# Patient Record
Sex: Female | Born: 1945
Health system: Southern US, Community
[De-identification: ages and names within clinical notes are randomized; demographics above are authoritative.]

## PROBLEM LIST (undated history)

## (undated) DIAGNOSIS — D649 Anemia, unspecified: Secondary | ICD-10-CM

## (undated) DIAGNOSIS — E119 Type 2 diabetes mellitus without complications: Secondary | ICD-10-CM

## (undated) DIAGNOSIS — I10 Essential (primary) hypertension: Secondary | ICD-10-CM

## (undated) DIAGNOSIS — M1990 Unspecified osteoarthritis, unspecified site: Secondary | ICD-10-CM

## (undated) DIAGNOSIS — E785 Hyperlipidemia, unspecified: Secondary | ICD-10-CM

## (undated) DIAGNOSIS — T7840XA Allergy, unspecified, initial encounter: Secondary | ICD-10-CM

## (undated) HISTORY — DX: Essential (primary) hypertension: I10

## (undated) HISTORY — DX: Anemia, unspecified: D64.9

## (undated) HISTORY — DX: Hyperlipidemia, unspecified: E78.5

## (undated) HISTORY — PX: POLYPECTOMY: SHX149

## (undated) HISTORY — DX: Unspecified osteoarthritis, unspecified site: M19.90

## (undated) HISTORY — DX: Type 2 diabetes mellitus without complications: E11.9

## (undated) HISTORY — DX: Allergy, unspecified, initial encounter: T78.40XA

---

## 1992-11-11 HISTORY — PX: ABDOMINAL HYSTERECTOMY: SHX81

## 1998-07-07 ENCOUNTER — Other Ambulatory Visit: Admission: RE | Admit: 1998-07-07 | Discharge: 1998-07-07 | Payer: Self-pay | Admitting: Obstetrics and Gynecology

## 1998-11-14 ENCOUNTER — Ambulatory Visit (HOSPITAL_COMMUNITY): Admission: RE | Admit: 1998-11-14 | Discharge: 1998-11-14 | Payer: Self-pay | Admitting: Internal Medicine

## 1998-11-14 ENCOUNTER — Encounter: Payer: Self-pay | Admitting: Internal Medicine

## 1999-08-13 ENCOUNTER — Other Ambulatory Visit: Admission: RE | Admit: 1999-08-13 | Discharge: 1999-08-13 | Payer: Self-pay | Admitting: Obstetrics and Gynecology

## 1999-12-28 ENCOUNTER — Encounter: Payer: Self-pay | Admitting: Internal Medicine

## 1999-12-28 ENCOUNTER — Ambulatory Visit (HOSPITAL_COMMUNITY): Admission: RE | Admit: 1999-12-28 | Discharge: 1999-12-28 | Payer: Self-pay | Admitting: Internal Medicine

## 2000-07-28 ENCOUNTER — Other Ambulatory Visit: Admission: RE | Admit: 2000-07-28 | Discharge: 2000-07-28 | Payer: Self-pay | Admitting: Obstetrics and Gynecology

## 2000-12-29 ENCOUNTER — Encounter: Payer: Self-pay | Admitting: Internal Medicine

## 2000-12-29 ENCOUNTER — Ambulatory Visit (HOSPITAL_COMMUNITY): Admission: RE | Admit: 2000-12-29 | Discharge: 2000-12-29 | Payer: Self-pay | Admitting: Internal Medicine

## 2001-08-20 ENCOUNTER — Other Ambulatory Visit: Admission: RE | Admit: 2001-08-20 | Discharge: 2001-08-20 | Payer: Self-pay | Admitting: Obstetrics and Gynecology

## 2001-11-11 HISTORY — PX: FLEXIBLE SIGMOIDOSCOPY: SHX1649

## 2002-01-01 ENCOUNTER — Encounter: Payer: Self-pay | Admitting: Internal Medicine

## 2002-01-01 ENCOUNTER — Ambulatory Visit (HOSPITAL_COMMUNITY): Admission: RE | Admit: 2002-01-01 | Discharge: 2002-01-01 | Payer: Self-pay | Admitting: Internal Medicine

## 2002-08-30 ENCOUNTER — Other Ambulatory Visit: Admission: RE | Admit: 2002-08-30 | Discharge: 2002-08-30 | Payer: Self-pay | Admitting: Obstetrics and Gynecology

## 2002-12-13 ENCOUNTER — Emergency Department (HOSPITAL_COMMUNITY): Admission: EM | Admit: 2002-12-13 | Discharge: 2002-12-13 | Payer: Self-pay | Admitting: Emergency Medicine

## 2002-12-13 ENCOUNTER — Encounter: Payer: Self-pay | Admitting: Emergency Medicine

## 2003-04-22 ENCOUNTER — Ambulatory Visit (HOSPITAL_COMMUNITY): Admission: RE | Admit: 2003-04-22 | Discharge: 2003-04-22 | Payer: Self-pay | Admitting: Internal Medicine

## 2003-04-22 ENCOUNTER — Encounter: Payer: Self-pay | Admitting: Internal Medicine

## 2003-07-23 ENCOUNTER — Encounter: Payer: Self-pay | Admitting: Emergency Medicine

## 2003-07-23 ENCOUNTER — Emergency Department (HOSPITAL_COMMUNITY): Admission: EM | Admit: 2003-07-23 | Discharge: 2003-07-23 | Payer: Self-pay | Admitting: Emergency Medicine

## 2003-12-23 ENCOUNTER — Other Ambulatory Visit: Admission: RE | Admit: 2003-12-23 | Discharge: 2003-12-23 | Payer: Self-pay | Admitting: Obstetrics and Gynecology

## 2004-01-27 ENCOUNTER — Encounter: Payer: Self-pay | Admitting: Internal Medicine

## 2004-04-25 ENCOUNTER — Ambulatory Visit (HOSPITAL_COMMUNITY): Admission: RE | Admit: 2004-04-25 | Discharge: 2004-04-25 | Payer: Self-pay | Admitting: Internal Medicine

## 2005-01-22 ENCOUNTER — Ambulatory Visit: Payer: Self-pay | Admitting: Internal Medicine

## 2005-01-28 ENCOUNTER — Ambulatory Visit: Payer: Self-pay | Admitting: Internal Medicine

## 2005-03-25 ENCOUNTER — Ambulatory Visit: Payer: Self-pay | Admitting: Internal Medicine

## 2005-04-26 ENCOUNTER — Ambulatory Visit (HOSPITAL_COMMUNITY): Admission: RE | Admit: 2005-04-26 | Discharge: 2005-04-26 | Payer: Self-pay | Admitting: Internal Medicine

## 2005-06-24 ENCOUNTER — Ambulatory Visit: Payer: Self-pay | Admitting: Internal Medicine

## 2005-09-10 ENCOUNTER — Ambulatory Visit: Payer: Self-pay | Admitting: Internal Medicine

## 2005-12-06 ENCOUNTER — Other Ambulatory Visit: Admission: RE | Admit: 2005-12-06 | Discharge: 2005-12-06 | Payer: Self-pay | Admitting: Obstetrics and Gynecology

## 2005-12-06 ENCOUNTER — Ambulatory Visit: Payer: Self-pay | Admitting: Internal Medicine

## 2006-02-21 ENCOUNTER — Ambulatory Visit: Payer: Self-pay | Admitting: Internal Medicine

## 2006-03-03 ENCOUNTER — Ambulatory Visit: Payer: Self-pay | Admitting: Internal Medicine

## 2006-04-14 ENCOUNTER — Ambulatory Visit: Payer: Self-pay | Admitting: Internal Medicine

## 2006-06-13 ENCOUNTER — Ambulatory Visit: Payer: Self-pay | Admitting: Internal Medicine

## 2006-07-10 ENCOUNTER — Ambulatory Visit (HOSPITAL_COMMUNITY): Admission: RE | Admit: 2006-07-10 | Discharge: 2006-07-10 | Payer: Self-pay | Admitting: Internal Medicine

## 2006-11-11 HISTORY — PX: COLONOSCOPY: SHX174

## 2006-12-12 ENCOUNTER — Ambulatory Visit: Payer: Self-pay | Admitting: Internal Medicine

## 2007-03-06 ENCOUNTER — Ambulatory Visit: Payer: Self-pay | Admitting: Internal Medicine

## 2007-03-06 LAB — CONVERTED CEMR LAB
ALT: 19 units/L (ref 0–40)
AST: 18 units/L (ref 0–37)
Albumin: 3.9 g/dL (ref 3.5–5.2)
Alkaline Phosphatase: 90 units/L (ref 39–117)
BUN: 16 mg/dL (ref 6–23)
Basophils Absolute: 0.1 10*3/uL (ref 0.0–0.1)
Basophils Relative: 0.7 % (ref 0.0–1.0)
Bilirubin, Direct: 0.1 mg/dL (ref 0.0–0.3)
CO2: 32 meq/L (ref 19–32)
Calcium: 8.9 mg/dL (ref 8.4–10.5)
Chloride: 106 meq/L (ref 96–112)
Cholesterol: 194 mg/dL (ref 0–200)
Creatinine, Ser: 0.6 mg/dL (ref 0.4–1.2)
Eosinophils Absolute: 0.2 10*3/uL (ref 0.0–0.6)
Eosinophils Relative: 2.1 % (ref 0.0–5.0)
GFR calc Af Amer: 130 mL/min
GFR calc non Af Amer: 108 mL/min
Glucose, Bld: 102 mg/dL — ABNORMAL HIGH (ref 70–99)
HCT: 35.2 % — ABNORMAL LOW (ref 36.0–46.0)
HDL: 68.3 mg/dL (ref >39.0)
Hemoglobin: 11.9 g/dL — ABNORMAL LOW (ref 12.0–15.0)
LDL Cholesterol: 117 mg/dL — ABNORMAL HIGH (ref 0–99)
Lymphocytes Relative: 37.9 % (ref 12.0–46.0)
MCHC: 33.8 g/dL (ref 30.0–36.0)
MCV: 80.9 fL (ref 78.0–100.0)
Monocytes Absolute: 0.7 10*3/uL (ref 0.2–0.7)
Monocytes Relative: 8.8 % (ref 3.0–11.0)
Neutro Abs: 3.7 10*3/uL (ref 1.4–7.7)
Neutrophils Relative %: 50.5 % (ref 43.0–77.0)
Platelets: 241 10*3/uL (ref 150–400)
Potassium: 3.8 meq/L (ref 3.5–5.1)
RBC: 4.35 M/uL (ref 3.87–5.11)
RDW: 13.2 % (ref 11.5–14.6)
Sodium: 145 meq/L (ref 135–145)
TSH: 1.92 microintl units/mL (ref 0.35–5.50)
Total Bilirubin: 0.8 mg/dL (ref 0.3–1.2)
Total CHOL/HDL Ratio: 2.8
Total Protein: 7.2 g/dL (ref 6.0–8.3)
Triglycerides: 44 mg/dL (ref 0–149)
VLDL: 9 mg/dL (ref 0–40)
WBC: 7.6 10*3/uL (ref 4.5–10.5)

## 2007-03-20 ENCOUNTER — Ambulatory Visit: Payer: Self-pay | Admitting: Internal Medicine

## 2007-04-14 ENCOUNTER — Ambulatory Visit: Payer: Self-pay | Admitting: Gastroenterology

## 2007-04-28 ENCOUNTER — Encounter: Payer: Self-pay | Admitting: Internal Medicine

## 2007-04-28 ENCOUNTER — Encounter: Payer: Self-pay | Admitting: Gastroenterology

## 2007-04-28 ENCOUNTER — Ambulatory Visit: Payer: Self-pay | Admitting: Gastroenterology

## 2007-06-23 DIAGNOSIS — J309 Allergic rhinitis, unspecified: Secondary | ICD-10-CM | POA: Insufficient documentation

## 2007-06-23 DIAGNOSIS — M1990 Unspecified osteoarthritis, unspecified site: Secondary | ICD-10-CM | POA: Insufficient documentation

## 2007-06-23 DIAGNOSIS — I10 Essential (primary) hypertension: Secondary | ICD-10-CM | POA: Insufficient documentation

## 2007-12-11 ENCOUNTER — Ambulatory Visit: Payer: Self-pay | Admitting: Internal Medicine

## 2007-12-11 ENCOUNTER — Ambulatory Visit (HOSPITAL_COMMUNITY): Admission: RE | Admit: 2007-12-11 | Discharge: 2007-12-11 | Payer: Self-pay | Admitting: Internal Medicine

## 2007-12-11 DIAGNOSIS — Z860100 Personal history of colon polyps, unspecified: Secondary | ICD-10-CM | POA: Insufficient documentation

## 2007-12-11 DIAGNOSIS — M899 Disorder of bone, unspecified: Secondary | ICD-10-CM | POA: Insufficient documentation

## 2007-12-11 DIAGNOSIS — M949 Disorder of cartilage, unspecified: Secondary | ICD-10-CM

## 2007-12-11 LAB — CONVERTED CEMR LAB
BUN: 14 mg/dL (ref 6–23)
CO2: 31 meq/L (ref 19–32)
Calcium: 9.7 mg/dL (ref 8.4–10.5)
Chloride: 101 meq/L (ref 96–112)
Creatinine, Ser: 0.7 mg/dL (ref 0.4–1.2)
GFR calc Af Amer: 109 mL/min
GFR calc non Af Amer: 90 mL/min
Glucose, Bld: 94 mg/dL (ref 70–99)
Potassium: 4.5 meq/L (ref 3.5–5.1)
Sodium: 140 meq/L (ref 135–145)

## 2007-12-18 ENCOUNTER — Ambulatory Visit: Payer: Self-pay | Admitting: Internal Medicine

## 2007-12-18 ENCOUNTER — Encounter: Payer: Self-pay | Admitting: Internal Medicine

## 2008-06-03 ENCOUNTER — Ambulatory Visit: Payer: Self-pay | Admitting: Internal Medicine

## 2008-06-03 LAB — CONVERTED CEMR LAB
ALT: 19 units/L (ref 0–35)
AST: 22 units/L (ref 0–37)
Albumin: 4 g/dL (ref 3.5–5.2)
Alkaline Phosphatase: 92 units/L (ref 39–117)
BUN: 16 mg/dL (ref 6–23)
Basophils Absolute: 0 10*3/uL (ref 0.0–0.1)
Basophils Relative: 0.5 % (ref 0.0–3.0)
Bilirubin Urine: NEGATIVE
Bilirubin, Direct: 0.1 mg/dL (ref 0.0–0.3)
Blood in Urine, dipstick: NEGATIVE
CO2: 27 meq/L (ref 19–32)
Calcium: 8.7 mg/dL (ref 8.4–10.5)
Chloride: 102 meq/L (ref 96–112)
Cholesterol: 193 mg/dL (ref 0–200)
Creatinine, Ser: 0.7 mg/dL (ref 0.4–1.2)
Eosinophils Absolute: 0.3 10*3/uL (ref 0.0–0.7)
Eosinophils Relative: 5.6 % — ABNORMAL HIGH (ref 0.0–5.0)
GFR calc Af Amer: 109 mL/min
GFR calc non Af Amer: 90 mL/min
Glucose, Bld: 121 mg/dL — ABNORMAL HIGH (ref 70–99)
Glucose, Urine, Semiquant: NEGATIVE
HCT: 35.1 % — ABNORMAL LOW (ref 36.0–46.0)
HDL: 45.6 mg/dL (ref >39.0)
Hemoglobin: 11.7 g/dL — ABNORMAL LOW (ref 12.0–15.0)
Ketones, urine, test strip: NEGATIVE
LDL Cholesterol: 135 mg/dL — ABNORMAL HIGH (ref 0–99)
Lymphocytes Relative: 41.2 % (ref 12.0–46.0)
MCHC: 33.3 g/dL (ref 30.0–36.0)
MCV: 84 fL (ref 78.0–100.0)
Monocytes Absolute: 0.5 10*3/uL (ref 0.1–1.0)
Monocytes Relative: 9.1 % (ref 3.0–12.0)
Neutro Abs: 2.6 10*3/uL (ref 1.4–7.7)
Neutrophils Relative %: 43.6 % (ref 43.0–77.0)
Nitrite: NEGATIVE
Platelets: 227 10*3/uL (ref 150–400)
Potassium: 4.1 meq/L (ref 3.5–5.1)
Protein, U semiquant: NEGATIVE
RBC: 4.18 M/uL (ref 3.87–5.11)
RDW: 11.7 % (ref 11.5–14.6)
Sodium: 138 meq/L (ref 135–145)
Specific Gravity, Urine: 1.02
TSH: 1.43 microintl units/mL (ref 0.35–5.50)
Total Bilirubin: 0.7 mg/dL (ref 0.3–1.2)
Total CHOL/HDL Ratio: 4.2
Total Protein: 7.5 g/dL (ref 6.0–8.3)
Triglycerides: 60 mg/dL (ref 0–149)
Urobilinogen, UA: 0.2
VLDL: 12 mg/dL (ref 0–40)
WBC Urine, dipstick: NEGATIVE
WBC: 5.8 10*3/uL (ref 4.5–10.5)
pH: 5.5

## 2008-06-10 ENCOUNTER — Ambulatory Visit: Payer: Self-pay | Admitting: Internal Medicine

## 2008-07-09 LAB — CONVERTED CEMR LAB: Pap Smear: NORMAL

## 2008-09-22 ENCOUNTER — Telehealth: Payer: Self-pay | Admitting: Internal Medicine

## 2008-09-22 ENCOUNTER — Ambulatory Visit: Payer: Self-pay | Admitting: Internal Medicine

## 2008-09-22 DIAGNOSIS — J069 Acute upper respiratory infection, unspecified: Secondary | ICD-10-CM | POA: Insufficient documentation

## 2008-09-26 ENCOUNTER — Telehealth: Payer: Self-pay | Admitting: Internal Medicine

## 2008-09-27 ENCOUNTER — Telehealth: Payer: Self-pay | Admitting: Internal Medicine

## 2008-09-28 ENCOUNTER — Telehealth: Payer: Self-pay | Admitting: Internal Medicine

## 2008-09-30 ENCOUNTER — Encounter: Payer: Self-pay | Admitting: Internal Medicine

## 2008-09-30 ENCOUNTER — Telehealth: Payer: Self-pay | Admitting: Internal Medicine

## 2008-10-10 ENCOUNTER — Ambulatory Visit: Payer: Self-pay | Admitting: Internal Medicine

## 2008-10-10 DIAGNOSIS — R55 Syncope and collapse: Secondary | ICD-10-CM | POA: Insufficient documentation

## 2008-10-11 ENCOUNTER — Telehealth: Payer: Self-pay | Admitting: Internal Medicine

## 2008-10-18 ENCOUNTER — Ambulatory Visit: Payer: Self-pay

## 2008-10-18 ENCOUNTER — Encounter: Payer: Self-pay | Admitting: Internal Medicine

## 2008-10-21 ENCOUNTER — Encounter: Payer: Self-pay | Admitting: Internal Medicine

## 2008-10-21 DIAGNOSIS — E079 Disorder of thyroid, unspecified: Secondary | ICD-10-CM | POA: Insufficient documentation

## 2008-10-24 ENCOUNTER — Telehealth: Payer: Self-pay | Admitting: Internal Medicine

## 2008-10-24 ENCOUNTER — Encounter: Admission: RE | Admit: 2008-10-24 | Discharge: 2008-10-24 | Payer: Self-pay | Admitting: Internal Medicine

## 2008-12-05 ENCOUNTER — Ambulatory Visit: Payer: Self-pay | Admitting: Internal Medicine

## 2008-12-05 LAB — CONVERTED CEMR LAB
T3, Free: 2.8 pg/mL (ref 2.3–4.2)
T4, Total: 6.1 ug/dL (ref 5.0–12.5)
TSH: 1.88 microintl units/mL (ref 0.35–5.50)

## 2009-03-03 ENCOUNTER — Ambulatory Visit: Payer: Self-pay | Admitting: Internal Medicine

## 2009-03-03 ENCOUNTER — Ambulatory Visit (HOSPITAL_COMMUNITY): Admission: RE | Admit: 2009-03-03 | Discharge: 2009-03-03 | Payer: Self-pay | Admitting: Internal Medicine

## 2009-06-02 ENCOUNTER — Ambulatory Visit: Payer: Self-pay | Admitting: Internal Medicine

## 2009-06-02 DIAGNOSIS — E876 Hypokalemia: Secondary | ICD-10-CM | POA: Insufficient documentation

## 2009-06-02 LAB — CONVERTED CEMR LAB
BUN: 18 mg/dL (ref 6–23)
CO2: 31 meq/L (ref 19–32)
Calcium: 8.9 mg/dL (ref 8.4–10.5)
Chloride: 104 meq/L (ref 96–112)
Creatinine, Ser: 0.7 mg/dL (ref 0.4–1.2)
Free T4: 0.9 ng/dL (ref 0.6–1.6)
GFR calc non Af Amer: 108.16 mL/min (ref >60)
Glucose, Bld: 98 mg/dL (ref 70–99)
Potassium: 4.1 meq/L (ref 3.5–5.1)
Sodium: 140 meq/L (ref 135–145)
T3, Free: 2.6 pg/mL (ref 2.3–4.2)
TSH: 1.79 microintl units/mL (ref 0.35–5.50)

## 2009-09-08 ENCOUNTER — Ambulatory Visit: Payer: Self-pay | Admitting: Internal Medicine

## 2009-09-08 LAB — CONVERTED CEMR LAB
ALT: 18 units/L (ref 0–35)
AST: 22 units/L (ref 0–37)
Albumin: 3.9 g/dL (ref 3.5–5.2)
Alkaline Phosphatase: 78 units/L (ref 39–117)
BUN: 15 mg/dL (ref 6–23)
Basophils Absolute: 0 10*3/uL (ref 0.0–0.1)
Basophils Relative: 0.5 % (ref 0.0–3.0)
Bilirubin Urine: NEGATIVE
Bilirubin, Direct: 0 mg/dL (ref 0.0–0.3)
Blood in Urine, dipstick: NEGATIVE
CO2: 27 meq/L (ref 19–32)
Calcium: 8.4 mg/dL (ref 8.4–10.5)
Chloride: 105 meq/L (ref 96–112)
Cholesterol: 193 mg/dL (ref 0–200)
Creatinine, Ser: 0.7 mg/dL (ref 0.4–1.2)
Eosinophils Absolute: 0.3 10*3/uL (ref 0.0–0.7)
Eosinophils Relative: 4.6 % (ref 0.0–5.0)
GFR calc non Af Amer: 108.07 mL/min (ref >60)
Glucose, Bld: 106 mg/dL — ABNORMAL HIGH (ref 70–99)
Glucose, Urine, Semiquant: NEGATIVE
HCT: 33.8 % — ABNORMAL LOW (ref 36.0–46.0)
HDL: 63 mg/dL (ref >39.00)
Hemoglobin: 11.5 g/dL — ABNORMAL LOW (ref 12.0–15.0)
Ketones, urine, test strip: NEGATIVE
LDL Cholesterol: 119 mg/dL — ABNORMAL HIGH (ref 0–99)
Lymphocytes Relative: 41.9 % (ref 12.0–46.0)
Lymphs Abs: 2.4 10*3/uL (ref 0.7–4.0)
MCHC: 33.9 g/dL (ref 30.0–36.0)
MCV: 84.9 fL (ref 78.0–100.0)
Monocytes Absolute: 0.5 10*3/uL (ref 0.1–1.0)
Monocytes Relative: 8.6 % (ref 3.0–12.0)
Neutro Abs: 2.6 10*3/uL (ref 1.4–7.7)
Neutrophils Relative %: 44.4 % (ref 43.0–77.0)
Nitrite: NEGATIVE
Platelets: 187 10*3/uL (ref 150.0–400.0)
Potassium: 4.3 meq/L (ref 3.5–5.1)
Protein, U semiquant: NEGATIVE
RBC: 3.99 M/uL (ref 3.87–5.11)
RDW: 12.3 % (ref 11.5–14.6)
Sodium: 143 meq/L (ref 135–145)
Specific Gravity, Urine: 1.025
TSH: 2.27 microintl units/mL (ref 0.35–5.50)
Total Bilirubin: 0.6 mg/dL (ref 0.3–1.2)
Total CHOL/HDL Ratio: 3
Total Protein: 7.1 g/dL (ref 6.0–8.3)
Triglycerides: 54 mg/dL (ref 0.0–149.0)
Urobilinogen, UA: 0.2
VLDL: 10.8 mg/dL (ref 0.0–40.0)
WBC: 5.8 10*3/uL (ref 4.5–10.5)
pH: 5

## 2009-09-27 ENCOUNTER — Ambulatory Visit: Payer: Self-pay | Admitting: Internal Medicine

## 2009-11-15 ENCOUNTER — Telehealth: Payer: Self-pay | Admitting: *Deleted

## 2009-11-16 ENCOUNTER — Ambulatory Visit: Payer: Self-pay | Admitting: Internal Medicine

## 2009-12-21 ENCOUNTER — Ambulatory Visit: Payer: Self-pay | Admitting: Internal Medicine

## 2009-12-21 LAB — CONVERTED CEMR LAB
BUN: 11 mg/dL (ref 6–23)
CO2: 30 meq/L (ref 19–32)
Calcium: 9 mg/dL (ref 8.4–10.5)
Chloride: 104 meq/L (ref 96–112)
Creatinine, Ser: 0.6 mg/dL (ref 0.4–1.2)
GFR calc non Af Amer: 129 mL/min (ref >60)
Glucose, Bld: 115 mg/dL — ABNORMAL HIGH (ref 70–99)
Potassium: 4.3 meq/L (ref 3.5–5.1)
Sodium: 141 meq/L (ref 135–145)

## 2009-12-28 ENCOUNTER — Ambulatory Visit: Payer: Self-pay | Admitting: Internal Medicine

## 2010-01-01 ENCOUNTER — Encounter: Payer: Self-pay | Admitting: Internal Medicine

## 2010-01-01 ENCOUNTER — Ambulatory Visit: Payer: Self-pay | Admitting: Internal Medicine

## 2010-03-28 ENCOUNTER — Ambulatory Visit: Payer: Self-pay | Admitting: Internal Medicine

## 2010-04-10 ENCOUNTER — Telehealth: Payer: Self-pay | Admitting: Internal Medicine

## 2010-04-13 ENCOUNTER — Ambulatory Visit: Payer: Self-pay | Admitting: Internal Medicine

## 2010-05-15 ENCOUNTER — Telehealth (INDEPENDENT_AMBULATORY_CARE_PROVIDER_SITE_OTHER): Payer: Self-pay | Admitting: *Deleted

## 2010-06-18 ENCOUNTER — Ambulatory Visit: Payer: Self-pay | Admitting: Internal Medicine

## 2010-06-18 DIAGNOSIS — K219 Gastro-esophageal reflux disease without esophagitis: Secondary | ICD-10-CM | POA: Insufficient documentation

## 2010-06-18 LAB — CONVERTED CEMR LAB
BUN: 15 mg/dL (ref 6–23)
CO2: 32 meq/L (ref 19–32)
Calcium: 9.1 mg/dL (ref 8.4–10.5)
Chloride: 101 meq/L (ref 96–112)
Creatinine, Ser: 0.7 mg/dL (ref 0.4–1.2)
GFR calc non Af Amer: 104.36 mL/min (ref >60)
Glucose, Bld: 146 mg/dL — ABNORMAL HIGH (ref 70–99)
Potassium: 4.1 meq/L (ref 3.5–5.1)
Sodium: 144 meq/L (ref 135–145)

## 2010-06-21 LAB — CONVERTED CEMR LAB: Vit D, 25-Hydroxy: 30 ng/mL (ref 30–89)

## 2010-12-02 ENCOUNTER — Encounter: Payer: Self-pay | Admitting: Internal Medicine

## 2010-12-11 NOTE — Assessment & Plan Note (Signed)
Summary: 2 month fup//ccm   Vital Signs:  Patient profile:   65 year old female Height:      65 inches Weight:      180 pounds BMI:     30.06 Temp:     98.2 degrees F oral Pulse rate:   72 / minute Resp:     14 per minute BP sitting:   130 / 80  (left arm)  Vitals Entered By: Willy Eddy, LPN (June 18, 2010 2:12 PM) CC: roa- c/o food getting stuck in esophagus, Hypertension Management Is Patient Diabetic? No   CC:  roa- c/o food getting stuck in esophagus and Hypertension Management.  History of Present Illness: Leg swelling has resolved the pt has been out in the heat and this may have contributed follow up blood pressure  Hypertension History:      Positive major cardiovascular risk factors include female age 69 years old or older and hypertension.  Negative major cardiovascular risk factors include non-tobacco-user status.     Preventive Screening-Counseling & Management  Alcohol-Tobacco     Smoking Status: never  Current Problems (verified): 1)  Hypokalemia, Mild  (ICD-276.8) 2)  Unspecified Disorder of Thyroid  (ICD-246.9) 3)  Syncope  (ICD-780.2) 4)  Viral Uri  (ICD-465.9) 5)  Preventive Health Care  (ICD-V70.0) 6)  Osteopenia  (ICD-733.90) 7)  Colonic Polyps, Hx of  (ICD-V12.72) 8)  Osteoarthritis  (ICD-715.90) 9)  Hypertension  (ICD-401.9) 10)  Allergic Rhinitis  (ICD-477.9)  Current Medications (verified): 1)  Potassium Chloride Crys Cr 20 Meq Tbcr (Potassium Chloride Crys Cr) .Marland Kitchen.. 1 Every Other Day 2)  Re Dualvit Ob 162-115.2-1 Mg  Caps (Prenat W/o A Vit-Fefum-Fepo-Fa) .... Once Daily 3)  Benadryl Allergy 25 Mg Strp (Diphenhydramine Hcl) .... 2 Q Am 4)  Fexofenadine-Pseudoephedrine 60-120 Mg Xr12h-Tab (Fexofenadine-Pseudoephedrine) .... One By Mouth Bid 5)  Avalide 300-12.5 Mg Tabs (Irbesartan-Hydrochlorothiazide) .Marland Kitchen.. 1 Once Daily 6)  Citracal Plus  Tabs (Multiple Minerals-Vitamins) .Marland Kitchen.. 1 Once Daily 7)  Nasonex 50 Mcg/act Susp (Mometasone  Furoate) .... Two Spray in Each Nare Daily  Allergies (verified): 1)  Aspirin (Aspirin) 2)  Celebrex (Celecoxib) 3)  Sulfamethoxazole (Sulfamethoxazole) 4)  * Vioxx (Rofecoxib) 5)  Nsaids  Past History:  Family History: Last updated: 06/23/2007 Family History Hypertension Family History Other cancer-liver FAm hx CVA  Social History: Last updated: 06/10/2008 Never Smoked Alcohol use-no Drug use-no  Risk Factors: Smoking Status: never (06/18/2010)  Past medical, surgical, family and social histories (including risk factors) reviewed, and no changes noted (except as noted below).  Past Medical History: Reviewed history from 12/11/2007 and no changes required. Allergic rhinitis Hypertension Osteoarthritis Colonic polyps, hx of  Past Surgical History: Reviewed history from 12/11/2007 and no changes required. Colonoscopy-04/28/2007 Flex Sigmoidoscopy-01/28/2002 Hysterectomy  Family History: Reviewed history from 06/23/2007 and no changes required. Family History Hypertension Family History Other cancer-liver FAm hx CVA  Social History: Reviewed history from 06/10/2008 and no changes required. Never Smoked Alcohol use-no Drug use-no  Review of Systems  The patient denies anorexia, fever, weight loss, weight gain, vision loss, decreased hearing, hoarseness, chest pain, syncope, dyspnea on exertion, peripheral edema, prolonged cough, headaches, hemoptysis, abdominal pain, melena, hematochezia, severe indigestion/heartburn, hematuria, incontinence, genital sores, muscle weakness, suspicious skin lesions, transient blindness, difficulty walking, depression, unusual weight change, abnormal bleeding, enlarged lymph nodes, angioedema, breast masses, and testicular masses.    Physical Exam  General:  Well-developed,well-nourished,in no acute distress; alert,appropriate and cooperative throughout examination Head:  normocephalic and atraumatic.  Eyes:  pupils equal, pupils  round, and nystagmus.   Ears:  R ear normal and L ear normal.   Nose:  no external deformity and no nasal discharge.   Mouth:  good dentition and pharynx pink and moist.   Neck:  supple and decreased ROM.   Lungs:  normal respiratory effort and no wheezes.   Heart:  normal rate and regular rhythm.   Abdomen:  Bowel sounds positive,abdomen soft and non-tender without masses, organomegaly or hernias noted. Msk:  No deformity or scoliosis noted of thoracic or lumbar spine.  heberdens nodes on index Extremities:  No clubbing, cyanosis, edema, or deformity noted with normal full range of motion of all joints.   Neurologic:  No cranial nerve deficits noted. Station and gait are normal. Plantar reflexes are down-going bilaterally. DTRs are symmetrical throughout. Sensory, motor and coordinative functions appear intact.   Impression & Recommendations:  Problem # 1:  HYPERTENSION (ICD-401.9) changed medicatiosn and swelling increased and did not feel that the blood pressure was controlled  Her updated medication list for this problem includes:    Avalide 300-12.5 Mg Tabs (Irbesartan-hydrochlorothiazide) .Marland Kitchen... 1 once daily  BP today: 130/80 Prior BP: 130/80 (04/13/2010)  Prior 10 Yr Risk Heart Disease: 7 % (12/28/2009)  Labs Reviewed: K+: 4.3 (12/21/2009) Creat: : 0.6 (12/21/2009)   Chol: 193 (09/08/2009)   HDL: 63.00 (09/08/2009)   LDL: 119 (09/08/2009)   TG: 54.0 (09/08/2009)  Problem # 2:  HYPOKALEMIA, MILD (ICD-276.8)  monitering blood work  Orders: TLB-BMP (Basic Metabolic Panel-BMET) (80048-METABOL)  Problem # 3:  ESOPHAGEAL REFLUX (ICD-530.81)  will mild dysphasia with solids such as pork or chicken  Her updated medication list for this problem includes:    Prilosec 20 Mg Cpdr (Omeprazole) ..... One by mouth daily  Complete Medication List: 1)  Potassium Chloride Crys Cr 20 Meq Tbcr (Potassium chloride crys cr) .Marland Kitchen.. 1 every other day 2)  Re Dualvit Ob 162-115.2-1 Mg Caps  (Prenat w/o a vit-fefum-fepo-fa) .... Once daily 3)  Benadryl Allergy 25 Mg Strp (Diphenhydramine hcl) .... 2 q am 4)  Fexofenadine-pseudoephedrine 60-120 Mg Xr12h-tab (Fexofenadine-pseudoephedrine) .... One by mouth bid 5)  Avalide 300-12.5 Mg Tabs (Irbesartan-hydrochlorothiazide) .Marland Kitchen.. 1 once daily 6)  Citracal Plus Tabs (Multiple minerals-vitamins) .Marland Kitchen.. 1 once daily 7)  Nasonex 50 Mcg/act Susp (Mometasone furoate) .... Two spray in each nare daily 8)  Prilosec 20 Mg Cpdr (Omeprazole) .... One by mouth daily  Other Orders: Venipuncture (47829) T-Vitamin D (25-Hydroxy) [phone]) TLB-Calcium (82310-CA)  Hypertension Assessment/Plan:      The patient's hypertensive risk group is category B: At least one risk factor (excluding diabetes) with no target organ damage.  Her calculated 10 year risk of coronary heart disease is 7 %.  Today's blood pressure is 130/80.  Her blood pressure goal is < 140/90.  Patient Instructions: 1)  NOV  CPX  Appended Document: Orders Update     Clinical Lists Changes  Orders: Added new Service order of Venipuncture ([id number]) - Signed Added new Service order of Specimen Handling ([id number]) - Signed

## 2010-12-11 NOTE — Assessment & Plan Note (Signed)
Summary: FUP/CCM   Vital Signs:  Patient Profile:   65 Years Old Female Weight:      168 pounds Temp:     98.4 degrees F oral Pulse rate:   76 / minute Resp:     14 per minute BP sitting:   120 / 72  (left arm)  Vitals Entered By: Willy Eddy, LPN (December 11, 2007 11:26 AM)                 Chief Complaint:  roa.  History of Present Illness: Current Problems:   OSTEOARTHRITIS (ICD-715.90)  fair control  IBU trial HYPERTENSION (ICD-401.9)  follow up with god results ALLERGIC RHINITIS (ICD-477.9)  fair control    Hypertension History:      She denies headache, chest pain, palpitations, dyspnea with exertion, orthopnea, PND, peripheral edema, visual symptoms, neurologic problems, syncope, and side effects from treatment.        Positive major cardiovascular risk factors include female age 46 years old or older and hypertension.     Current Allergies: ASPIRIN (ASPIRIN) CELEBREX (CELECOXIB) SULFAMETHOXAZOLE (SULFAMETHOXAZOLE) * VIOXX (ROFECOXIB)  Past Medical History:    Reviewed history from 06/23/2007 and no changes required:       Allergic rhinitis       Hypertension       Osteoarthritis       Colonic polyps, hx of  Past Surgical History:    Reviewed history from 06/23/2007 and no changes required:       Colonoscopy-04/28/2007       Flex Sigmoidoscopy-01/28/2002       Hysterectomy   Family History:    Reviewed history from 06/23/2007 and no changes required:       Family History Hypertension       Family History Other cancer-liver       FAm hx CVA  Social History:    Reviewed history and no changes required:   Risk Factors:  Mammogram History:     Date of Last Mammogram:  12/11/2007    Results:  Normal Bilateral   Colonoscopy History:     Date of Last Colonoscopy:  04/28/2007    Results:  Adenomatous Polyp    Review of Systems  The patient denies anorexia, fever, weight loss, vision loss, decreased hearing, hoarseness, chest pain,  syncope, dyspnea on exhertion, peripheral edema, prolonged cough, hemoptysis, abdominal pain, melena, hematochezia, severe indigestion/heartburn, hematuria, incontinence, genital sores, muscle weakness, suspicious skin lesions, transient blindness, difficulty walking, depression, unusual weight change, abnormal bleeding, enlarged lymph nodes, angioedema, and breast masses.         head aches ran out of xyzal helped hass a salty taste after allergy pills   Physical Exam  General:     Well-developed,well-nourished,in no acute distress; alert,appropriate and cooperative throughout examination Head:     Normocephalic and atraumatic without obvious abnormalities. No apparent alopecia or balding. Eyes:     No corneal or conjunctival inflammation noted. EOMI. Perrla. Funduscopic exam benign, without hemorrhages, exudates or papilledema. Vision grossly normal. Nose:     External nasal examination shows no deformity or inflammation. Nasal mucosa are pink and moist without lesions or exudates. Neck:     No deformities, masses, or tenderness noted. Lungs:     Normal respiratory effort, chest expands symmetrically. Lungs are clear to auscultation, no crackles or wheezes. Heart:     normal rate and regular rhythm.   Abdomen:     soft, non-tender, and normal bowel sounds.   Genitalia:  normal introitus.   Msk:     No deformity or scoliosis noted of thoracic or lumbar spine.  heberdens nodes on index    Impression & Recommendations:  Problem # 1:  OSTEOARTHRITIS (ICD-715.90) Discussed use of medications, application of heat or cold, and exercises.  Her updated medication list for this problem includes:    Diclofenac Sodium Cr 100 Mg Tb24 (Diclofenac sodium) ..... One by mouth daily  Orders: T-Bone Densitometry [phone])   Problem # 2:  HYPERTENSION (ICD-401.9)  Her updated medication list for this problem includes:    Avalide 300-12.5 Mg Tabs (Irbesartan-hydrochlorothiazide) ..... One  by mouth daily  BP today: 120/72  10 Yr Risk Heart Disease: 7 %  Labs Reviewed: Creat: 0.6 (03/06/2007) Chol: 194 (03/06/2007)   HDL: 68.3 (03/06/2007)   LDL: 117 (03/06/2007)   TG: 44 (03/06/2007)  Orders: TLB-BMP (Basic Metabolic Panel-BMET) (80048-METABOL)   Problem # 3:  ALLERGIC RHINITIS (ICD-477.9) OTC zyrtec,  The following medications were removed from the medication list:    Fexofenadine Hcl 180 Mg Tabs (Fexofenadine hcl)  Her updated medication list for this problem includes:    Nasonex 50 Mcg/act Susp (Mometasone furoate)    Xyzal 5 Mg Tabs (Levocetirizine dihydrochloride) ..... Once daily    Nasonex 50 Mcg/act Susp (Mometasone furoate) .Marland Kitchen..Marland Kitchen Two sparys q nare q hs Discussed use of allergy medications and environmental measures.   Medications Added to Medication List This Visit: 1)  Avalide 300-12.5 Mg Tabs (Irbesartan-hydrochlorothiazide) .... One by mouth daily 2)  Xyzal 5 Mg Tabs (Levocetirizine dihydrochloride) .... Once daily 3)  Tandem Plus 162-115.2-1 Mg Caps (Fefum-fepo-fa-b cmp-c-zn-mn-cu) .... Once daily 4)  Diclofenac Sodium Cr 100 Mg Tb24 (Diclofenac sodium) .... One by mouth daily 5)  Nasonex 50 Mcg/act Susp (Mometasone furoate) .... Two sparys q nare q hs  Complete Medication List: 1)  Avalide 300-12.5 Mg Tabs (Irbesartan-hydrochlorothiazide) .... One by mouth daily 2)  Potassium Chloride Crys Cr 20 Meq Tbcr (Potassium chloride crys cr) 3)  Nasonex 50 Mcg/act Susp (Mometasone furoate) 4)  Xyzal 5 Mg Tabs (Levocetirizine dihydrochloride) .... Once daily 5)  Tandem Plus 162-115.2-1 Mg Caps (Fefum-fepo-fa-b cmp-c-zn-mn-cu) .... Once daily 6)  Diclofenac Sodium Cr 100 Mg Tb24 (Diclofenac sodium) .... One by mouth daily 7)  Nasonex 50 Mcg/act Susp (Mometasone furoate) .... Two sparys q nare q hs  Hypertension Assessment/Plan:      The patient's hypertensive risk group is category B: At least one risk factor (excluding diabetes) with no target organ  damage.  Her calculated 10 year risk of coronary heart disease is 7 %.  Today's blood pressure is 120/72.  Her blood pressure goal is < 140/90.   Patient Instructions: 1)  Please schedule a follow-up appointment in 6 months.  CPX 2)  BMP prior to visit, ICD-9: 3)  Hepatic Panel prior to visit, ICD-9: 4)  Lipid Panel prior to visit, ICD-9: 5)  TSH prior to visit, ICD-9                 :V70.0 6)  CBC w/ Diff prior to visit, ICD-9: 7)  Urine-dip prior to visit, ICD-9:    Prescriptions: AVALIDE 300-12.5 MG TABS (IRBESARTAN-HYDROCHLOROTHIAZIDE) one by mouth daily  #30 x 11   Entered and Authorized by:   Stacie Glaze MD   Signed by:   Stacie Glaze MD on 12/11/2007   Method used:   Print then Give to Patient   RxID:   [id number] NASONEX 50 MCG/ACT  SUSP (MOMETASONE  FUROATE) two sparys q nare q HS  #1 x 11   Entered and Authorized by:   Stacie Glaze MD   Signed by:   Stacie Glaze MD on 12/11/2007   Method used:   Print then Give to Patient   RxID:   [id number] DICLOFENAC SODIUM CR 100 MG  TB24 (DICLOFENAC SODIUM) one by mouth daily  #30 x 6   Entered and Authorized by:   Stacie Glaze MD   Signed by:   Stacie Glaze MD on 12/11/2007   Method used:   Print then Give to Patient   RxID:   [id number]  ]

## 2010-12-11 NOTE — Progress Notes (Signed)
Summary: out of work dates  Advice worker from Patient   Caller: live Call For: Elisabella Hacker Summary of Call: Dates on FMLA paperwork for out of work are 11-30 to 12-7.  Supposed to go back to work 12-8.  Doppler test on my neck is scheduled for 12-8.  Would prefer not to go back to work until after this test is done.  Can the dates of out of work be extended to 12-8 and go back to work 12-9? Initial call taken by: Rudy Jew, RN,  October 11, 2008 1:45 PM  Follow-up for Phone Call        pt informed ok Follow-up by: Willy Eddy, LPN,  October 11, 2008 3:09 PM

## 2010-12-11 NOTE — Assessment & Plan Note (Signed)
Summary: cpx/njr   Vital Signs:  Patient profile:   65 year old female Height:      65 inches Weight:      156 pounds BMI:     26.05 Temp:     98.2 degrees F oral Pulse rate:   80 / minute Resp:     14 per minute BP sitting:   130 / 76  (left arm)  Vitals Entered By: Willy Eddy, LPN (September 27, 2009 4:38 PM)  Contraindications/Deferment of Procedures/Staging:    Test/Procedure: FLU VAX    Reason for deferment: patient declined  CC: cpx   CC:  cpx.  History of Present Illness: The pt was asked about all immunizations, health maint. services that are appropriate to their age and was given guidance on diet exercize  and weight management   Preventive Screening-Counseling & Management  Alcohol-Tobacco     Smoking Status: never  Problems Prior to Update: 1)  Hypokalemia, Mild  (ICD-276.8) 2)  Unspecified Disorder of Thyroid  (ICD-246.9) 3)  Syncope  (ICD-780.2) 4)  Viral Uri  (ICD-465.9) 5)  Preventive Health Care  (ICD-V70.0) 6)  Osteopenia  (ICD-733.90) 7)  Colonic Polyps, Hx of  (ICD-V12.72) 8)  Osteoarthritis  (ICD-715.90) 9)  Hypertension  (ICD-401.9) 10)  Allergic Rhinitis  (ICD-477.9)  Medications Prior to Update: 1)  Avalide 300-12.5 Mg Tabs (Irbesartan-Hydrochlorothiazide) .... One By Mouth Daily 2)  Potassium Chloride Crys Cr 20 Meq Tbcr (Potassium Chloride Crys Cr) .Marland Kitchen.. 1 Once Daily 3)  Nasonex 50 Mcg/act  Susp (Mometasone Furoate) .... Once Dailyhold 4)  Re Dualvit Ob 162-115.2-1 Mg  Caps (Prenat W/o A Vit-Fefum-Fepo-Fa) .... Once Daily 5)  Benadryl Allergy 25 Mg Strp (Diphenhydramine Hcl) .... 2 Q Am 6)  Fexofenadine-Pseudoephedrine 60-120 Mg Xr12h-Tab (Fexofenadine-Pseudoephedrine) .... One By Mouth Bid  Current Medications (verified): 1)  Avapro 300 Mg Tabs (Irbesartan) .... One By Mouth Daily 2)  Potassium Chloride Crys Cr 20 Meq Tbcr (Potassium Chloride Crys Cr) .Marland Kitchen.. 1 Once Daily 3)  Re Dualvit Ob 162-115.2-1 Mg  Caps (Prenat W/o A  Vit-Fefum-Fepo-Fa) .... Once Daily 4)  Benadryl Allergy 25 Mg Strp (Diphenhydramine Hcl) .... 2 Q Am 5)  Fexofenadine-Pseudoephedrine 60-120 Mg Xr12h-Tab (Fexofenadine-Pseudoephedrine) .... One By Mouth Bid 6)  Hydrochlorothiazide 12.5 Mg Caps (Hydrochlorothiazide) .... One By Mouth Daily With The Avapro  Allergies (verified): 1)  Aspirin (Aspirin) 2)  Celebrex (Celecoxib) 3)  Sulfamethoxazole (Sulfamethoxazole) 4)  * Vioxx (Rofecoxib) 5)  Nsaids  Past History:  Family History: Last updated: 06/23/2007 Family History Hypertension Family History Other cancer-liver FAm hx CVA  Social History: Last updated: 06/10/2008 Never Smoked Alcohol use-no Drug use-no  Risk Factors: Smoking Status: never (09/27/2009)  Past medical, surgical, family and social histories (including risk factors) reviewed, and no changes noted (except as noted below).  Past Medical History: Reviewed history from 12/11/2007 and no changes required. Allergic rhinitis Hypertension Osteoarthritis Colonic polyps, hx of  Past Surgical History: Reviewed history from 12/11/2007 and no changes required. Colonoscopy-04/28/2007 Flex Sigmoidoscopy-01/28/2002 Hysterectomy  Family History: Reviewed history from 06/23/2007 and no changes required. Family History Hypertension Family History Other cancer-liver FAm hx CVA  Social History: Reviewed history from 06/10/2008 and no changes required. Never Smoked Alcohol use-no Drug use-no  Review of Systems  The patient denies anorexia, fever, weight loss, weight gain, vision loss, decreased hearing, hoarseness, chest pain, syncope, dyspnea on exertion, peripheral edema, prolonged cough, headaches, hemoptysis, abdominal pain, melena, hematochezia, severe indigestion/heartburn, hematuria, incontinence, genital sores, muscle weakness, suspicious skin  lesions, transient blindness, difficulty walking, depression, unusual weight change, abnormal bleeding, enlarged lymph  nodes, angioedema, and breast masses.    Physical Exam  General:  Well-developed,well-nourished,in no acute distress; alert,appropriate and cooperative throughout examination Head:  normocephalic and atraumatic.   Eyes:  pupils equal, pupils round, and nystagmus.   Ears:  R ear normal and L ear normal.   Nose:  no external deformity and no nasal discharge.   Mouth:  pharynx pink and moist and no erythema.   Neck:  supple and decreased ROM.   Lungs:  normal respiratory effort and no wheezes.   Heart:  normal rate and regular rhythm.   Abdomen:  Bowel sounds positive,abdomen soft and non-tender without masses, organomegaly or hernias noted. Msk:  No deformity or scoliosis noted of thoracic or lumbar spine.  heberdens nodes on index Extremities:  No clubbing, cyanosis, edema, or deformity noted with normal full range of motion of all joints.   Neurologic:  No cranial nerve deficits noted. Station and gait are normal. Plantar reflexes are down-going bilaterally. DTRs are symmetrical throughout. Sensory, motor and coordinative functions appear intact.   Impression & Recommendations:  Problem # 1:  PREVENTIVE HEALTH CARE (ICD-V70.0) The pt was asked about all immunizations, health maint. services that are appropriate to their age and was given guidance on diet exercize  and weight management  Mammogram: ASSESSMENT: Negative - BI-RADS 1^MM DIGITAL SCREENING (03/03/2009) Pap smear: normal (07/09/2008) Colonoscopy: Adenomatous Polyp (04/28/2007) Td Booster: Historical (11/11/2006)   Chol: 193 (09/08/2009)   HDL: 63.00 (09/08/2009)   LDL: 119 (09/08/2009)   TG: 54.0 (09/08/2009) TSH: 2.27 (09/08/2009)   Next mammogram due:: 03/2010 (09/27/2009)  Discussed using sunscreen, use of alcohol, drug use, self breast exam, routine dental care, routine eye care, schedule for GYN exam, routine physical exam, seat belts, multiple vitamins, osteoporosis prevention, adequate calcium intake in diet,  recommendations for immunizations, mammograms and Pap smears.  Discussed exercise and checking cholesterol.  Discussed gun safety, safe sex, and contraception.  Complete Medication List: 1)  Avapro 300 Mg Tabs (Irbesartan) .... One by mouth daily 2)  Potassium Chloride Crys Cr 20 Meq Tbcr (Potassium chloride crys cr) .Marland Kitchen.. 1 once daily 3)  Re Dualvit Ob 162-115.2-1 Mg Caps (Prenat w/o a vit-fefum-fepo-fa) .... Once daily 4)  Benadryl Allergy 25 Mg Strp (Diphenhydramine hcl) .... 2 q am 5)  Fexofenadine-pseudoephedrine 60-120 Mg Xr12h-tab (Fexofenadine-pseudoephedrine) .... One by mouth bid 6)  Hydrochlorothiazide 12.5 Mg Caps (Hydrochlorothiazide) .... One by mouth daily with the avapro  Other Orders: EKG w/ Interpretation (93000)  Patient Instructions: 1)  Please schedule a follow-up appointment in 6 months. Prescriptions: HYDROCHLOROTHIAZIDE 12.5 MG CAPS (HYDROCHLOROTHIAZIDE) one by mouth daily with the avapro  #30 x 11   Entered and Authorized by:   Stacie Glaze MD   Signed by:   Stacie Glaze MD on 09/27/2009   Method used:   Electronically to        [facility]  [phone]* (retail)       [address] [place], [place]  [address]       Ph: [phone] or [phone]       Fax: [phone]   RxID:   [id number]    Preventive Care Screening  Mammogram:    Next Due:  03/2010  Pap Smear:    Date:  07/09/2008    Next Due:  07/2010    Results:  normal

## 2010-12-11 NOTE — Progress Notes (Signed)
Summary: legs swelling  Phone Note Call from Patient   Caller: Patient Call For: Stacie Glaze MD Reason for Call: Referral Complaint: Breathing Problems Summary of Call: Pt wants to go back on Avalide and go off Diovan.  Her legs are swelling. [phone] Initial call taken by: Lynann Beaver CMA,  May 15, 2010 2:13 PM  Follow-up for Phone Call        mat resume the avalide and we will have to do a prior authorization Follow-up by: Stacie Glaze MD,  May 16, 2010 7:55 AM    New/Updated Medications: AVALIDE 300-12.5 MG TABS (IRBESARTAN-HYDROCHLOROTHIAZIDE) 1 once daily Prescriptions: AVALIDE 300-12.5 MG TABS (IRBESARTAN-HYDROCHLOROTHIAZIDE) 1 once daily  #30 x 6   Entered by:   Willy Eddy, LPN   Authorized by:   Stacie Glaze MD   Signed by:   Willy Eddy, LPN on 95/28/4132   Method used:   Electronically to        [facility]  [phone]* (retail)       [address] [place], [place]  [address]       Ph: [phone] or [phone]       Fax: [phone]   RxID:   [id number]

## 2010-12-11 NOTE — Progress Notes (Signed)
Summary: Feeling better, requesting RTW note  Phone Note Call from Patient Call back at Home Phone [phone]   Caller: Patient Triage VM Call For: Stacie Glaze MD Complaint: Cough/Sore throat Summary of Call: Triage VM, pt reporting she is feeling much better and would like a note returning her to work on Monday, 11/23 Initial call taken by: Sid Falcon LPN,  September 30, 2008 12:35 PM  Follow-up for Phone Call        may give note Follow-up by: Stacie Glaze MD,  September 30, 2008 1:23 PM  Additional Follow-up for Phone Call Additional follow up Details #1::        Pt informed RTW note is ready for pick-up. Additional Follow-up by: Sid Falcon LPN,  September 30, 2008 2:02 PM

## 2010-12-11 NOTE — Assessment & Plan Note (Signed)
Summary: CPX/JLS   Vital Signs:  Patient Profile:   65 Years Old Female Weight:      170 pounds Temp:     98.5 degrees F oral Pulse rate:   76 / minute Resp:     14 per minute BP sitting:   110 / 70  (left arm)  Vitals Entered By: Willy Eddy, LPN (June 10, 2008 10:50 AM)                 Chief Complaint:  cpx--dr mccombs does gyn needs- dt in 2008-colon in 2008-ekg in emr.  History of Present Illness: discusion of sweet tea an sugar in diet CPX no walking as before    Current Allergies: ASPIRIN (ASPIRIN) CELEBREX (CELECOXIB) SULFAMETHOXAZOLE (SULFAMETHOXAZOLE) * VIOXX (ROFECOXIB)  Past Medical History:    Reviewed history from 12/11/2007 and no changes required:       Allergic rhinitis       Hypertension       Osteoarthritis       Colonic polyps, hx of  Past Surgical History:    Reviewed history from 12/11/2007 and no changes required:       Colonoscopy-04/28/2007       Flex Sigmoidoscopy-01/28/2002       Hysterectomy   Family History:    Reviewed history from 06/23/2007 and no changes required:       Family History Hypertension       Family History Other cancer-liver       FAm hx CVA  Social History:    Reviewed history and no changes required:       Never Smoked       Alcohol use-no       Drug use-no   Risk Factors:  Tobacco use:  never Drug use:  no Alcohol use:  no   Review of Systems  The patient denies anorexia, fever, weight loss, weight gain, vision loss, decreased hearing, hoarseness, chest pain, syncope, dyspnea on exertion, peripheral edema, prolonged cough, headaches, hemoptysis, abdominal pain, melena, hematochezia, severe indigestion/heartburn, hematuria, incontinence, genital sores, muscle weakness, suspicious skin lesions, transient blindness, difficulty walking, depression, unusual weight change, abnormal bleeding, enlarged lymph nodes, angioedema, and breast masses.     Physical Exam  General:  Well-developed,well-nourished,in no acute distress; alert,appropriate and cooperative throughout examination Head:     Normocephalic and atraumatic without obvious abnormalities. No apparent alopecia or balding. Eyes:     No corneal or conjunctival inflammation noted. EOMI. Perrla. Funduscopic exam benign, without hemorrhages, exudates or papilledema. Vision grossly normal. Nose:     External nasal examination shows no deformity or inflammation. Nasal mucosa are pink and moist without lesions or exudates. Neck:     No deformities, masses, or tenderness noted. Lungs:     Normal respiratory effort, chest expands symmetrically. Lungs are clear to auscultation, no crackles or wheezes. Heart:     normal rate and regular rhythm.   Abdomen:     soft, non-tender, and normal bowel sounds.      Impression & Recommendations:  Problem # 1:  ALLERGIC RHINITIS (ICD-477.9)  The following medications were removed from the medication list:    Xyzal 5 Mg Tabs (Levocetirizine dihydrochloride) ..... Once daily  Her updated medication list for this problem includes:    Nasonex 50 Mcg/act Susp (Mometasone furoate)    Nasonex 50 Mcg/act Susp (Mometasone furoate) .Marland Kitchen..Marland Kitchen Two sparys q nare q hs Discussed use of allergy medications and environmental measures.  allegra D trial  Complete Medication List:  1)  Avalide 300-12.5 Mg Tabs (Irbesartan-hydrochlorothiazide) .... One by mouth daily 2)  Potassium Chloride Crys Cr 20 Meq Tbcr (Potassium chloride crys cr) 3)  Nasonex 50 Mcg/act Susp (Mometasone furoate) 4)  Diclofenac Sodium Cr 100 Mg Tb24 (Diclofenac sodium) .... One by mouth daily 5)  Nasonex 50 Mcg/act Susp (Mometasone furoate) .... Two sparys q nare q hs 6)  Re Dualvit Ob 162-115.2-1 Mg Caps (Prenat w/o a vit-fefum-fepo-fa) .... Once daily 7)  Allegra-d 24 Hour 180-240 Mg Xr24h-tab (Fexofenadine-pseudoephedrine) .... One by mouth daily   Patient Instructions: 1)  Please schedule a follow-up  appointment in 4 months.   Prescriptions: ALLEGRA-D 24 HOUR 180-240 MG  XR24H-TAB (FEXOFENADINE-PSEUDOEPHEDRINE) one by mouth daily  #30 x 3   Entered and Authorized by:   Stacie Glaze MD   Signed by:   Stacie Glaze MD on 06/10/2008   Method used:   Electronically sent to ...       [facility]  [phone]*       [address]       [place], [place]  [address]       Ph: [phone] or [phone]       Fax: [phone]   RxID:   [id number]  ]

## 2010-12-11 NOTE — Miscellaneous (Signed)
Summary: BONE DENSITY  Clinical Lists Changes  Orders: Added new Test order of T-Lumbar Vertebral Assessment (77082) - Signed 

## 2010-12-11 NOTE — Assessment & Plan Note (Signed)
Summary: 4 month rov/njr   Vital Signs:  Patient Profile:   65 Years Old Female Weight:      160 pounds Temp:     98.4 degrees F oral Pulse rate:   100 / minute Resp:     14 per minute BP sitting:   116 / 72  (left arm)  Vitals Entered By: Willy Eddy, LPN (October 10, 2008 12:26 PM)                 Chief Complaint:  roa---dizziness somewhat improved but still has some dizziness at times.  History of Present Illness: She was feeling better then stopped the meclizine and the dizzyness came back The pt states that the dizziness is worse with leaning head back The dizzyness remains positional and did initially respond to the meclizine risks are HTN and falimiy hx Current Meds:  AVALIDE 300-12.5 MG TABS (IRBESARTAN-HYDROCHLOROTHIAZIDE) one by mouth daily POTASSIUM CHLORIDE CRYS CR 20 MEQ TBCR (POTASSIUM CHLORIDE CRYS CR) 1 once daily NASONEX 50 MCG/ACT  SUSP (MOMETASONE FUROATE) once dailyhold DICLOFENAC SODIUM CR 100 MG  TB24 (DICLOFENAC SODIUM) one by mouth daily RE DUALVIT OB 162-115.2-1 MG  CAPS (PRENAT W/O A VIT-FEFUM-FEPO-FA) once daily ALLEGRA-D 24 HOUR 180-240 MG  XR24H-TAB (FEXOFENADINE-PSEUDOEPHEDRINE) one by mouth daily ANTIVERT 25 MG TABS (MECLIZINE HCL) 1 three times a day      Current Allergies: ASPIRIN (ASPIRIN) CELEBREX (CELECOXIB) SULFAMETHOXAZOLE (SULFAMETHOXAZOLE) * VIOXX (ROFECOXIB)  Past Medical History:    Reviewed history from 12/11/2007 and no changes required:       Allergic rhinitis       Hypertension       Osteoarthritis       Colonic polyps, hx of  Past Surgical History:    Reviewed history from 12/11/2007 and no changes required:       Colonoscopy-04/28/2007       Flex Sigmoidoscopy-01/28/2002       Hysterectomy   Family History:    Reviewed history from 06/23/2007 and no changes required:       Family History Hypertension       Family History Other cancer-liver       FAm hx CVA  Social History:    Reviewed history from  06/10/2008 and no changes required:       Never Smoked       Alcohol use-no       Drug use-no    Review of Systems  The patient denies anorexia, fever, weight loss, weight gain, vision loss, decreased hearing, hoarseness, chest pain, syncope, dyspnea on exertion, peripheral edema, prolonged cough, headaches, hemoptysis, abdominal pain, melena, hematochezia, severe indigestion/heartburn, hematuria, incontinence, genital sores, muscle weakness, suspicious skin lesions, transient blindness, difficulty walking, depression, unusual weight change, abnormal bleeding, enlarged lymph nodes, angioedema, and breast masses.     Physical Exam  General:     Well-developed,well-nourished,in no acute distress; alert,appropriate and cooperative throughout examination Head:     normocephalic and atraumatic.   Eyes:     pupils equal, pupils round, and nystagmus.   Ears:     R ear normal and L ear normal.   Nose:     no external deformity and no nasal discharge.   Mouth:     pharynx pink and moist and no erythema.   Neck:     supple and decreased ROM.   Lungs:     normal respiratory effort and no wheezes.   Heart:     normal rate and regular rhythm.  Abdomen:     Bowel sounds positive,abdomen soft and non-tender without masses, organomegaly or hernias noted.    Impression & Recommendations:  Problem # 1:  HYPERTENSION (ICD-401.9)  Her updated medication list for this problem includes:    Avalide 300-12.5 Mg Tabs (Irbesartan-hydrochlorothiazide) ..... One by mouth daily  BP today: 116/72 Prior BP: 136/80 (09/22/2008)  Prior 10 Yr Risk Heart Disease: 7 % (12/11/2007)  Labs Reviewed: Creat: 0.7 (06/03/2008) Chol: 193 (06/03/2008)   HDL: 45.6 (06/03/2008)   LDL: 135 (06/03/2008)   TG: 60 (06/03/2008)   Problem # 2:  SYNCOPE (ICD-780.2)  Orders: Doppler Referral (Doppler)   Problem # 3:  ALLERGIC RHINITIS (ICD-477.9)  The following medications were removed from the medication  list:    Nasonex 50 Mcg/act Susp (Mometasone furoate) .Marland Kitchen..Marland Kitchen Two sparys q nare q hs  Her updated medication list for this problem includes:    Nasonex 50 Mcg/act Susp (Mometasone furoate) ..... Once dailyhold Discussed use of allergy medications and environmental measures.   Medications Added to Medication List This Visit: 1)  Nasonex 50 Mcg/act Susp (Mometasone furoate) .... Once dailyhold 2)  Bpm Pseudo 6-45 Mg Xr12h-tab (Brompheniramine-pseudoeph) .... One by mouth bid 3)  Prednisone 20 Mg Tabs (Prednisone) .... Two for 2 days then one for 2 days the 1/2 for 2 days  Complete Medication List: 1)  Avalide 300-12.5 Mg Tabs (Irbesartan-hydrochlorothiazide) .... One by mouth daily 2)  Potassium Chloride Crys Cr 20 Meq Tbcr (Potassium chloride crys cr) .Marland Kitchen.. 1 once daily 3)  Nasonex 50 Mcg/act Susp (Mometasone furoate) .... Once dailyhold 4)  Diclofenac Sodium Cr 100 Mg Tb24 (Diclofenac sodium) .... One by mouth daily 5)  Re Dualvit Ob 162-115.2-1 Mg Caps (Prenat w/o a vit-fefum-fepo-fa) .... Once daily 6)  Allegra-d 24 Hour 180-240 Mg Xr24h-tab (Fexofenadine-pseudoephedrine) .... One by mouth daily 7)  Antivert 25 Mg Tabs (Meclizine hcl) .Marland Kitchen.. 1 three times a day 8)  Bpm Pseudo 6-45 Mg Xr12h-tab (Brompheniramine-pseudoeph) .... One by mouth bid 9)  Prednisone 20 Mg Tabs (Prednisone) .... Two for 2 days then one for 2 days the 1/2 for 2 days   Patient Instructions: 1)  Please schedule a follow-up appointment in 2 months.   Prescriptions: PREDNISONE 20 MG TABS (PREDNISONE) two for 2 days then one for 2 days the 1/2 for 2 days  #7 x 0   Entered and Authorized by:   Stacie Glaze MD   Signed by:   Stacie Glaze MD on 10/10/2008   Method used:   Electronically to        [facility]  [phone]* (retail)       [address] [place], [place]  [address]       Ph: [phone] or [phone]       Fax: [phone]   RxID:   [id number] BPM PSEUDO 6-45 MG XR12H-TAB  (BROMPHENIRAMINE-PSEUDOEPH) one by mouth BID  #60 x 0   Entered and Authorized by:   Stacie Glaze MD   Signed by:   Stacie Glaze MD on 10/10/2008   Method used:   Electronically to        [facility]  [phone]* (retail)       [address] [place], [place]  [address]       Ph: [phone] or [phone]       Fax: [phone]   RxID:  [id number]  ] 

## 2010-12-11 NOTE — Assessment & Plan Note (Signed)
Summary: dizziness and nausea/bmw   Vital Signs:  Patient Profile:   65 Years Old Female Temp:     98.4 degrees F oral Pulse rate:   76 / minute Resp:     14 per minute BP sitting:   136 / 80  (left arm)  Vitals Entered By: Willy Eddy, LPN (September 22, 2008 2:48 PM)                 Chief Complaint:  c/o dizziness and nausea since yesterday and URI symptoms.  History of Present Illness:  URI Symptoms      This is a 65 year old woman who presents with URI symptoms.  The patient reports nasal congestion.  Associated symptoms include fever.  The patient also reports itchy watery eyes, seasonal symptoms, headache, muscle aches, and severe fatigue.  The patient denies the following risk factors for Strep sinusitis: unilateral facial pain, unilateral nasal discharge, poor response to decongestant, double sickening, tooth pain, Strep exposure, tender adenopathy, and absence of cough.      Current Allergies: ASPIRIN (ASPIRIN) CELEBREX (CELECOXIB) SULFAMETHOXAZOLE (SULFAMETHOXAZOLE) * VIOXX (ROFECOXIB)  Past Medical History:    Reviewed history from 12/11/2007 and no changes required:       Allergic rhinitis       Hypertension       Osteoarthritis       Colonic polyps, hx of  Past Surgical History:    Reviewed history from 12/11/2007 and no changes required:       Colonoscopy-04/28/2007       Flex Sigmoidoscopy-01/28/2002       Hysterectomy   Family History:    Reviewed history from 06/23/2007 and no changes required:       Family History Hypertension       Family History Other cancer-liver       FAm hx CVA  Social History:    Reviewed history from 06/10/2008 and no changes required:       Never Smoked       Alcohol use-no       Drug use-no    Review of Systems       The patient complains of fever, hoarseness, and prolonged cough.  The patient denies anorexia, weight loss, weight gain, vision loss, decreased hearing, chest pain, syncope, dyspnea on exertion,  peripheral edema, headaches, hemoptysis, abdominal pain, melena, hematochezia, severe indigestion/heartburn, hematuria, incontinence, genital sores, muscle weakness, suspicious skin lesions, transient blindness, difficulty walking, depression, unusual weight change, abnormal bleeding, enlarged lymph nodes, angioedema, breast masses, and testicular masses.     Physical Exam  General:     Well-developed,well-nourished,in no acute distress; alert,appropriate and cooperative throughout examination Eyes:     No corneal or conjunctival inflammation noted. EOMI. Perrla. Funduscopic exam benign, without hemorrhages, exudates or papilledema. Vision grossly normal. Ears:     R ear normal and L ear normal.   Nose:     no external deformity.   Mouth:     pharynx pink and moist and no erythema.   Neck:     No deformities, masses, or tenderness noted. Lungs:     normal respiratory effort and normal breath sounds.   Heart:     normal rate and regular rhythm.   Abdomen:     Bowel sounds positive,abdomen soft and non-tender without masses, organomegaly or hernias noted.    Impression & Recommendations:  Problem # 1:  HYPERTENSION (ICD-401.9)  Her updated medication list for this problem includes:    Avalide  300-12.5 Mg Tabs (Irbesartan-hydrochlorothiazide) ..... One by mouth daily  BP today: 136/80 Prior BP: 110/70 (06/10/2008)  Prior 10 Yr Risk Heart Disease: 7 % (12/11/2007)  Labs Reviewed: Creat: 0.7 (06/03/2008) Chol: 193 (06/03/2008)   HDL: 45.6 (06/03/2008)   LDL: 135 (06/03/2008)   TG: 60 (06/03/2008)   Problem # 2:  VIRAL URI (ICD-465.9)  Her updated medication list for this problem includes:    Diclofenac Sodium Cr 100 Mg Tb24 (Diclofenac sodium) ..... One by mouth daily    Allegra-d 24 Hour 180-240 Mg Xr24h-tab (Fexofenadine-pseudoephedrine) ..... One by mouth daily    Promethazine-codeine 6.25-10 Mg/39ml Syrp (Promethazine-codeine) .Marland Kitchen..Marland Kitchen Two tsp by mouth q 6 hour for nausia  and cough Instructed on symptomatic treatment. Call if symptoms persist or worsen.   Complete Medication List: 1)  Avalide 300-12.5 Mg Tabs (Irbesartan-hydrochlorothiazide) .... One by mouth daily 2)  Potassium Chloride Crys Cr 20 Meq Tbcr (Potassium chloride crys cr) .Marland Kitchen.. 1 once daily 3)  Nasonex 50 Mcg/act Susp (Mometasone furoate) .... Once daily 4)  Diclofenac Sodium Cr 100 Mg Tb24 (Diclofenac sodium) .... One by mouth daily 5)  Nasonex 50 Mcg/act Susp (Mometasone furoate) .... Two sparys q nare q hs 6)  Re Dualvit Ob 162-115.2-1 Mg Caps (Prenat w/o a vit-fefum-fepo-fa) .... Once daily 7)  Allegra-d 24 Hour 180-240 Mg Xr24h-tab (Fexofenadine-pseudoephedrine) .... One by mouth daily 8)  Promethazine-codeine 6.25-10 Mg/51ml Syrp (Promethazine-codeine) .... Two tsp by mouth q 6 hour for nausia and cough 9)  Azithromycin 250 Mg Tabs (Azithromycin) .... Take two (2 ) tablets day one, then one (1) tablet a day for four (4) more days   Patient Instructions: 1)  Push fluids take the phenergan with codiene and you may have telenol for the fevers 2)  stay out of work this weeks   Prescriptions: AZITHROMYCIN 250 MG TABS (AZITHROMYCIN) Take two (2 ) tablets day one, then one (1) tablet a day for four (4) more days  #6 x 0   Entered and Authorized by:   Stacie Glaze MD   Signed by:   Stacie Glaze MD on 09/22/2008   Method used:   Print then Give to Patient   RxID:   [id number] PROMETHAZINE-CODEINE 6.25-10 MG/5ML SYRP (PROMETHAZINE-CODEINE) two tsp by mouth q 6 hour for nausia and cough  #6 oz x 0   Entered and Authorized by:   Stacie Glaze MD   Signed by:   Stacie Glaze MD on 09/22/2008   Method used:   Print then Give to Patient   RxID:   [id number]  ]

## 2010-12-11 NOTE — Progress Notes (Signed)
Summary: dizziness and nausea  Phone Note Call from Patient   Caller: Patient Call For: Dr. Lovell Sheehan Summary of Call: Pt is having dizziness and nausea this am.  Had to leave work, and wants to be seen.  No other symptoms. [phone] Initial call taken by: Lynann Beaver CMA,  September 22, 2008 9:46 AM  Follow-up for Phone Call        ov given  Follow-up by: Willy Eddy, LPN,  September 22, 2008 10:08 AM

## 2010-12-11 NOTE — Procedures (Signed)
Summary: colonoscopy  colonoscopy   Imported By: Kassie Mends 12/16/2007 09:35:18  _____________________________________________________________________  External Attachment:    Type:   Image     Comment:   colonoscopy

## 2010-12-11 NOTE — Miscellaneous (Signed)
Summary: Orders Update   Clinical Lists Changes  Problems: Added new problem of UNSPECIFIED DISORDER OF THYROID (ICD-246.9) Orders: Added new Referral order of Radiology Referral (Radiology) - Signed

## 2010-12-11 NOTE — Assessment & Plan Note (Signed)
Summary: 6 WK ROV/NJR   Vital Signs:  Patient profile:   65 year old female Height:      65 inches Weight:      167 pounds BMI:     27.89 Temp:     98.2 degrees F oral Pulse rate:   72 / minute Resp:     14 per minute BP sitting:   130 / 80  (left arm)  Vitals Entered By: Willy Eddy, LPN (December 28, 2009 10:31 AM) CC: roa labs, Hypertension Management Comments pt states she is only taking k+qod until this ov   CC:  roa labs and Hypertension Management.  History of Present Illness: The pt had a low potassium and we  have monitered her use of potassium every other day with her blood pressure  medications. blood  woork looks stable and she feels well on this  plan  ( the hx was  hypokalemia  gave replacment then developed hyper kalemia  so adjusted the medications)   Hypertension History:      She denies headache, chest pain, palpitations, dyspnea with exertion, orthopnea, PND, peripheral edema, visual symptoms, neurologic problems, syncope, and side effects from treatment.        Positive major cardiovascular risk factors include female age 36 years old or older and hypertension.  Negative major cardiovascular risk factors include non-tobacco-user status.     Problems Prior to Update: 1)  Hypokalemia, Mild  (ICD-276.8) 2)  Unspecified Disorder of Thyroid  (ICD-246.9) 3)  Syncope  (ICD-780.2) 4)  Viral Uri  (ICD-465.9) 5)  Preventive Health Care  (ICD-V70.0) 6)  Osteopenia  (ICD-733.90) 7)  Colonic Polyps, Hx of  (ICD-V12.72) 8)  Osteoarthritis  (ICD-715.90) 9)  Hypertension  (ICD-401.9) 10)  Allergic Rhinitis  (ICD-477.9)  Current Problems (verified): 1)  Hypokalemia, Mild  (ICD-276.8) 2)  Unspecified Disorder of Thyroid  (ICD-246.9) 3)  Syncope  (ICD-780.2) 4)  Viral Uri  (ICD-465.9) 5)  Preventive Health Care  (ICD-V70.0) 6)  Osteopenia  (ICD-733.90) 7)  Colonic Polyps, Hx of  (ICD-V12.72) 8)  Osteoarthritis  (ICD-715.90) 9)  Hypertension   (ICD-401.9) 10)  Allergic Rhinitis  (ICD-477.9)  Medications Prior to Update: 1)  Potassium Chloride Crys Cr 20 Meq Tbcr (Potassium Chloride Crys Cr) .Marland Kitchen.. 1 Once Daily 2)  Re Dualvit Ob 162-115.2-1 Mg  Caps (Prenat W/o A Vit-Fefum-Fepo-Fa) .... Once Daily 3)  Benadryl Allergy 25 Mg Strp (Diphenhydramine Hcl) .... 2 Q Am 4)  Fexofenadine-Pseudoephedrine 60-120 Mg Xr12h-Tab (Fexofenadine-Pseudoephedrine) .... One By Mouth Bid 5)  Bystolic 10 Mg Tabs (Nebivolol Hcl) .... One By Mouth Daily  Current Medications (verified): 1)  Potassium Chloride Crys Cr 20 Meq Tbcr (Potassium Chloride Crys Cr) .Marland Kitchen.. 1 Once Daily 2)  Re Dualvit Ob 162-115.2-1 Mg  Caps (Prenat W/o A Vit-Fefum-Fepo-Fa) .... Once Daily 3)  Benadryl Allergy 25 Mg Strp (Diphenhydramine Hcl) .... 2 Q Am 4)  Fexofenadine-Pseudoephedrine 60-120 Mg Xr12h-Tab (Fexofenadine-Pseudoephedrine) .... One By Mouth Bid 5)  Bystolic 10 Mg Tabs (Nebivolol Hcl) .... One By Mouth Daily 6)  Citracal Plus  Tabs (Multiple Minerals-Vitamins) .Marland Kitchen.. 1 Once Daily  Allergies (verified): 1)  Aspirin (Aspirin) 2)  Celebrex (Celecoxib) 3)  Sulfamethoxazole (Sulfamethoxazole) 4)  * Vioxx (Rofecoxib) 5)  Nsaids  Past History:  Family History: Last updated: 06/23/2007 Family History Hypertension Family History Other cancer-liver FAm hx CVA  Social History: Last updated: 06/10/2008 Never Smoked Alcohol use-no Drug use-no  Risk Factors: Smoking Status: never (11/16/2009)  Past medical, surgical, family and  social histories (including risk factors) reviewed, and no changes noted (except as noted below).  Past Medical History: Reviewed history from 12/11/2007 and no changes required. Allergic rhinitis Hypertension Osteoarthritis Colonic polyps, hx of  Past Surgical History: Reviewed history from 12/11/2007 and no changes required. Colonoscopy-04/28/2007 Flex Sigmoidoscopy-01/28/2002 Hysterectomy  Family History: Reviewed history from  06/23/2007 and no changes required. Family History Hypertension Family History Other cancer-liver FAm hx CVA  Social History: Reviewed history from 06/10/2008 and no changes required. Never Smoked Alcohol use-no Drug use-no  Review of Systems  The patient denies anorexia, fever, weight loss, weight gain, vision loss, decreased hearing, hoarseness, chest pain, syncope, dyspnea on exertion, peripheral edema, prolonged cough, headaches, hemoptysis, abdominal pain, melena, hematochezia, severe indigestion/heartburn, hematuria, incontinence, genital sores, muscle weakness, suspicious skin lesions, transient blindness, difficulty walking, depression, unusual weight change, abnormal bleeding, enlarged lymph nodes, angioedema, and breast masses.    Physical Exam  General:  Well-developed,well-nourished,in no acute distress; alert,appropriate and cooperative throughout examination Head:  normocephalic and atraumatic.   Ears:  R ear normal and L ear normal.   Nose:  no external deformity and no nasal discharge.   Mouth:  good dentition and pharynx pink and moist.   Neck:  supple and decreased ROM.   Lungs:  normal respiratory effort and no wheezes.   Heart:  normal rate and regular rhythm.   Abdomen:  Bowel sounds positive,abdomen soft and non-tender without masses, organomegaly or hernias noted.   Impression & Recommendations:  Problem # 1:  HYPOKALEMIA, MILD (ICD-276.8) resolved with stabe reading  Problem # 2:  OSTEOPENIA (ICD-733.90)  Discussed medication use, applications of heat or ice, and exercises.  reviewed last bones density and established appropriate sreening protocol  Orders: T-Bone Densitometry ([address])  Problem # 3:  HYPERTENSION (ICD-401.9)  Her updated medication list for this problem includes:    Bystolic 10 Mg Tabs (Nebivolol hcl) ..... One by mouth daily  BP today: 130/80 Prior BP: 146/80 (11/16/2009)  10 Yr Risk Heart Disease: 7 % Prior 10 Yr Risk Heart  Disease: 9 % (11/16/2009)  Labs Reviewed: K+: 4.3 (12/21/2009) Creat: : 0.6 (12/21/2009)   Chol: 193 (09/08/2009)   HDL: 63.00 (09/08/2009)   LDL: 119 (09/08/2009)   TG: 54.0 (09/08/2009)  Problem # 4:  ALLERGIC RHINITIS (ICD-477.9)  Her updated medication list for this problem includes:    Benadryl Allergy 25 Mg Strp (Diphenhydramine hcl) .Marland Kitchen... 2 q am  Discussed use of allergy medications and environmental measures.   Complete Medication List: 1)  Potassium Chloride Crys Cr 20 Meq Tbcr (Potassium chloride crys cr) .Marland Kitchen.. 1 once daily 2)  Re Dualvit Ob 162-115.2-1 Mg Caps (Prenat w/o a vit-fefum-fepo-fa) .... Once daily 3)  Benadryl Allergy 25 Mg Strp (Diphenhydramine hcl) .... 2 q am 4)  Fexofenadine-pseudoephedrine 60-120 Mg Xr12h-tab (Fexofenadine-pseudoephedrine) .... One by mouth bid 5)  Bystolic 10 Mg Tabs (Nebivolol hcl) .... One by mouth daily 6)  Citracal Plus Tabs (Multiple minerals-vitamins) .Marland Kitchen.. 1 once daily  Hypertension Assessment/Plan:      The patient's hypertensive risk group is category B: At least one risk factor (excluding diabetes) with no target organ damage.  Her calculated 10 year risk of coronary heart disease is 7 %.  Today's blood pressure is 130/80.  Her blood pressure goal is < 140/90.  Patient Instructions: 1)  Please schedule a follow-up appointment in 4 months.   Preventive Care Screening  Colonoscopy:    Next Due:  05/2009  Bone Density:  Date:  12/15/2007    Results:  normal std dev    Prevention & Chronic Care Immunizations   Influenza vaccine: Not documented   Influenza vaccine deferral: patient declined  (09/27/2009)    Tetanus booster: 11/11/2006: Historical   Tetanus booster due: 11/11/2016    Pneumococcal vaccine: Not documented    H. zoster vaccine: Not documented  Colorectal Screening   Hemoccult: Not documented    Colonoscopy: Adenomatous Polyp  (04/28/2007)   Colonoscopy due: 05/2009  Other Screening   Pap smear:  normal  (07/09/2008)   Pap smear due: 07/2010    Mammogram: ASSESSMENT: Negative - BI-RADS 1^MM DIGITAL SCREENING  (03/03/2009)   Mammogram due: 03/2010    DXA bone density scan: normal  (12/15/2007)   DXA bone density action/deferral: Ordered  (12/28/2009)   Smoking status: never  (11/16/2009)  Lipids   Total Cholesterol: 193  (09/08/2009)   LDL: 119  (09/08/2009)   LDL Direct: Not documented   HDL: 63.00  (09/08/2009)   Triglycerides: 54.0  (09/08/2009)  Hypertension   Last Blood Pressure: 130 / 80  (12/28/2009)   Serum creatinine: 0.6  (12/21/2009)   Serum potassium 4.3  (12/21/2009)  Self-Management Support :    Hypertension self-management support: Not documented   Nursing Instructions: Give Pneumovax today Schedule screening DXA bone density scan (see order)    Appended Document: Orders Update     Clinical Lists Changes  Orders: Added new Service order of Pneumococcal Vaccine ([address]) - Signed Added new Service order of Admin 1st Vaccine (60454) - Signed Observations: Added new observation of PNEUMOVAXLOT: 1110z (12/28/2009 11:08) Added new observation of PNEUMOVAXEXP: 12/07/2010 (12/28/2009 11:08) Added new observation of PNEUMOVAXBY: Willy Eddy, LPN (09/81/1914 11:08) Added new observation of PNEUMOVAXRTE: IM (12/28/2009 11:08) Added new observation of PNEUMOVAXDOS: 0.5 ml (12/28/2009 11:08) Added new observation of PNEUMOVAXMFR: Merck (12/28/2009 11:08) Added new observation of PNEUMOVAXSIT: left deltoid (12/28/2009 11:08) Added new observation of PNEUMOVAX: Pneumovax (12/28/2009 11:08)       Immunizations Administered:  Pneumonia Vaccine:    Vaccine Type: Pneumovax    Site: left deltoid    Mfr: Merck    Dose: 0.5 ml    Route: IM    Given by: Willy Eddy, LPN    Exp. Date: 12/07/2010    Lot #: [id number]

## 2010-12-11 NOTE — Letter (Signed)
Summary: Out of Work  Adult nurse at [employer]  [address]   [place], [place] [address]   Phone: [phone]  Fax: [phone]    October 10, 2008   Employee:  JALISA SACCO Seelig    To Whom It May Concern:   For Medical reasons, please excuse the above named employee from work for the following dates:  Start:   10/10/2008  End:   10/17/2008  If you need additional information, please feel free to contact our office.         Sincerely,    Stacie Glaze MD

## 2010-12-11 NOTE — Assessment & Plan Note (Signed)
Summary: 3 month rov/njr   Vital Signs:  Patient profile:   65 year old female Height:      65 inches Weight:      160 pounds BMI:     26.72 Temp:     98.2 degrees F oral Pulse rate:   76 / minute Resp:     14 per minute BP sitting:   110 / 70  (left arm)  Vitals Entered By: Willy Eddy, LPN (June 02, 2009 1:55 PM)  CC:  roa- couldnt take etodolac due to feet swelling.  History of Present Illness:  Follow-Up Visit      This is a 65 year old woman who presents for Follow-up visit.  The patient denies chest pain, palpitations, dizziness, syncope, low blood sugar symptoms, high blood sugar symptoms, edema, SOB, DOE, PND, and orthopnea.  Since the last visit the patient notes no new problems or concerns.  The patient reports taking meds as prescribed and monitoring BP.  When questioned about possible medication side effects, the patient notes none.    Problems Prior to Update: 1)  Unspecified Disorder of Thyroid  (ICD-246.9) 2)  Syncope  (ICD-780.2) 3)  Viral Uri  (ICD-465.9) 4)  Preventive Health Care  (ICD-V70.0) 5)  Osteopenia  (ICD-733.90) 6)  Colonic Polyps, Hx of  (ICD-V12.72) 7)  Osteoarthritis  (ICD-715.90) 8)  Hypertension  (ICD-401.9) 9)  Allergic Rhinitis  (ICD-477.9)  Medications Prior to Update: 1)  Avalide 300-12.5 Mg Tabs (Irbesartan-Hydrochlorothiazide) .... One By Mouth Daily 2)  Potassium Chloride Crys Cr 20 Meq Tbcr (Potassium Chloride Crys Cr) .Marland Kitchen.. 1 Once Daily 3)  Nasonex 50 Mcg/act  Susp (Mometasone Furoate) .... Once Dailyhold 4)  Etodolac Cr 400 Mg Xr24h-Tab (Etodolac) .... One By Mouth Daily 5)  Re Dualvit Ob 162-115.2-1 Mg  Caps (Prenat W/o A Vit-Fefum-Fepo-Fa) .... Once Daily 6)  Benadryl Allergy 25 Mg Strp (Diphenhydramine Hcl) .... 2 Q Am  Current Medications (verified): 1)  Avalide 300-12.5 Mg Tabs (Irbesartan-Hydrochlorothiazide) .... One By Mouth Daily 2)  Potassium Chloride Crys Cr 20 Meq Tbcr (Potassium Chloride Crys Cr) .Marland Kitchen.. 1 Once  Daily 3)  Nasonex 50 Mcg/act  Susp (Mometasone Furoate) .... Once Dailyhold 4)  Re Dualvit Ob 162-115.2-1 Mg  Caps (Prenat W/o A Vit-Fefum-Fepo-Fa) .... Once Daily 5)  Benadryl Allergy 25 Mg Strp (Diphenhydramine Hcl) .... 2 Q Am 6)  Fexofenadine-Pseudoephedrine 60-120 Mg Xr12h-Tab (Fexofenadine-Pseudoephedrine) .... One By Mouth Bid  Allergies: 1)  Aspirin (Aspirin) 2)  Celebrex (Celecoxib) 3)  Sulfamethoxazole (Sulfamethoxazole) 4)  * Vioxx (Rofecoxib) 5)  Nsaids  Past History:  Family History: Last updated: 06/23/2007 Family History Hypertension Family History Other cancer-liver FAm hx CVA  Social History: Last updated: 06/10/2008 Never Smoked Alcohol use-no Drug use-no  Risk Factors: Smoking Status: never (06/10/2008)  Past medical, surgical, family and social histories (including risk factors) reviewed, and no changes noted (except as noted below).  Past Medical History: Reviewed history from 12/11/2007 and no changes required. Allergic rhinitis Hypertension Osteoarthritis Colonic polyps, hx of  Past Surgical History: Reviewed history from 12/11/2007 and no changes required. Colonoscopy-04/28/2007 Flex Sigmoidoscopy-01/28/2002 Hysterectomy  Family History: Reviewed history from 06/23/2007 and no changes required. Family History Hypertension Family History Other cancer-liver FAm hx CVA  Social History: Reviewed history from 06/10/2008 and no changes required. Never Smoked Alcohol use-no Drug use-no  Review of Systems  The patient denies anorexia, fever, weight loss, weight gain, vision loss, decreased hearing, hoarseness, chest pain, syncope, dyspnea on exertion, peripheral edema, prolonged  cough, headaches, hemoptysis, abdominal pain, melena, hematochezia, severe indigestion/heartburn, hematuria, incontinence, genital sores, muscle weakness, suspicious skin lesions, transient blindness, difficulty walking, depression, unusual weight change, abnormal  bleeding, enlarged lymph nodes, angioedema, and breast masses.    Physical Exam  General:  Well-developed,well-nourished,in no acute distress; alert,appropriate and cooperative throughout examination Head:  normocephalic and atraumatic.   Eyes:  pupils equal, pupils round, and nystagmus.   Ears:  R ear normal and L ear normal.   Nose:  no external deformity and no nasal discharge.   Mouth:  pharynx pink and moist and no erythema.   Neck:  supple and decreased ROM.   Lungs:  normal respiratory effort and no wheezes.   Heart:  normal rate and regular rhythm.   Abdomen:  Bowel sounds positive,abdomen soft and non-tender without masses, organomegaly or hernias noted. Msk:  No deformity or scoliosis noted of thoracic or lumbar spine.  heberdens nodes on index   Impression & Recommendations:  Problem # 1:  UNSPECIFIED DISORDER OF THYROID (ICD-246.9) monitering of thyrpid hormnone levels Orders: Venipuncture ([address]) TLB-TSH (Thyroid Stimulating Hormone) (84443-TSH) TLB-T4 (Thyrox), Free [phone]) TLB-T3, Free (Triiodothyronine) (84481-T3FREE)  Problem # 2:  HYPERTENSION (ICD-401.9) stable Her updated medication list for this problem includes:    Avalide 300-12.5 Mg Tabs (Irbesartan-hydrochlorothiazide) ..... One by mouth daily  Orders: TLB-BMP (Basic Metabolic Panel-BMET) (80048-METABOL)  BP today: 110/70 Prior BP: 128/78 (03/03/2009)  Prior 10 Yr Risk Heart Disease: 7 % (12/11/2007)  Labs Reviewed: K+: 4.1 (06/03/2008) Creat: : 0.7 (06/03/2008)   Chol: 193 (06/03/2008)   HDL: 45.6 (06/03/2008)   LDL: 135 (06/03/2008)   TG: 60 (06/03/2008)  Problem # 3:  HYPOKALEMIA, MILD (ICD-276.8) moniter potassium and adjust does as needed  Complete Medication List: 1)  Avalide 300-12.5 Mg Tabs (Irbesartan-hydrochlorothiazide) .... One by mouth daily 2)  Potassium Chloride Crys Cr 20 Meq Tbcr (Potassium chloride crys cr) .Marland Kitchen.. 1 once daily 3)  Nasonex 50 Mcg/act Susp (Mometasone  furoate) .... Once dailyhold 4)  Re Dualvit Ob 162-115.2-1 Mg Caps (Prenat w/o a vit-fefum-fepo-fa) .... Once daily 5)  Benadryl Allergy 25 Mg Strp (Diphenhydramine hcl) .... 2 q am 6)  Fexofenadine-pseudoephedrine 60-120 Mg Xr12h-tab (Fexofenadine-pseudoephedrine) .... One by mouth bid  Patient Instructions: 1)  Please schedule a follow-up appointment in 4 months.  CPX Prescriptions: FEXOFENADINE-PSEUDOEPHEDRINE 60-120 MG XR12H-TAB (FEXOFENADINE-PSEUDOEPHEDRINE) one by mouth BID  #60 x 11   Entered and Authorized by:   Stacie Glaze MD   Signed by:   Stacie Glaze MD on 06/02/2009   Method used:   Electronically to        [facility]  [phone]* (retail)       [address] [place], [place]  [address]       Ph: [phone] or [phone]       Fax: [phone]   RxID:   [id number]

## 2010-12-11 NOTE — Assessment & Plan Note (Signed)
Summary: feet and legs swelling ? due to new med/cjr   Vital Signs:  Patient profile:   65 year old female Height:      65 inches Weight:      186 pounds BMI:     31.06 Temp:     98.2 degrees F oral Pulse rate:   80 / minute Resp:     14 per minute BP sitting:   130 / 80  (left arm)  Vitals Entered By: Willy Eddy, LPN (April 14, 5187 3:47 PM) CC: roa- pt states has feet swelling at times since started bystolic   CC:  roa- pt states has feet swelling at times since started bystolic.  History of Present Illness: she has essential htn and had hypokalemia on he avalide we changes this to bystolllic due to formulary issues she is now on medicare she noted increasing edema in her led following the change approximately 4 weeks ago she has never had edema before her diet is not changed and she has no signes of CHF or CP   Preventive Screening-Counseling & Management  Alcohol-Tobacco     Smoking Status: never  Current Problems (verified): 1)  Hypokalemia, Mild  (ICD-276.8) 2)  Unspecified Disorder of Thyroid  (ICD-246.9) 3)  Syncope  (ICD-780.2) 4)  Viral Uri  (ICD-465.9) 5)  Preventive Health Care  (ICD-V70.0) 6)  Osteopenia  (ICD-733.90) 7)  Colonic Polyps, Hx of  (ICD-V12.72) 8)  Osteoarthritis  (ICD-715.90) 9)  Hypertension  (ICD-401.9) 10)  Allergic Rhinitis  (ICD-477.9)  Current Medications (verified): 1)  Potassium Chloride Crys Cr 20 Meq Tbcr (Potassium Chloride Crys Cr) .Marland Kitchen.. 1 Every Other Day 2)  Re Dualvit Ob 162-115.2-1 Mg  Caps (Prenat W/o A Vit-Fefum-Fepo-Fa) .... Once Daily 3)  Benadryl Allergy 25 Mg Strp (Diphenhydramine Hcl) .... 2 Q Am 4)  Fexofenadine-Pseudoephedrine 60-120 Mg Xr12h-Tab (Fexofenadine-Pseudoephedrine) .... One By Mouth Bid 5)  Bystolic 10 Mg Tabs (Nebivolol Hcl) .... One By Mouth Daily 6)  Citracal Plus  Tabs (Multiple Minerals-Vitamins) .Marland Kitchen.. 1 Once Daily 7)  Nasonex 50 Mcg/act Susp (Mometasone Furoate) .... Two Spray in Each Nare  Daily  Allergies (verified): 1)  Aspirin (Aspirin) 2)  Celebrex (Celecoxib) 3)  Sulfamethoxazole (Sulfamethoxazole) 4)  * Vioxx (Rofecoxib) 5)  Nsaids  Past History:  Family History: Last updated: 06/23/2007 Family History Hypertension Family History Other cancer-liver FAm hx CVA  Social History: Last updated: 06/10/2008 Never Smoked Alcohol use-no Drug use-no  Risk Factors: Smoking Status: never (04/13/2010)  Past medical, surgical, family and social histories (including risk factors) reviewed, and no changes noted (except as noted below).  Past Medical History: Reviewed history from 12/11/2007 and no changes required. Allergic rhinitis Hypertension Osteoarthritis Colonic polyps, hx of  Past Surgical History: Reviewed history from 12/11/2007 and no changes required. Colonoscopy-04/28/2007 Flex Sigmoidoscopy-01/28/2002 Hysterectomy  Family History: Reviewed history from 06/23/2007 and no changes required. Family History Hypertension Family History Other cancer-liver FAm hx CVA  Social History: Reviewed history from 06/10/2008 and no changes required. Never Smoked Alcohol use-no Drug use-no  Review of Systems  The patient denies anorexia, fever, weight loss, weight gain, vision loss, decreased hearing, hoarseness, chest pain, syncope, dyspnea on exertion, peripheral edema, prolonged cough, headaches, hemoptysis, abdominal pain, melena, hematochezia, severe indigestion/heartburn, hematuria, incontinence, genital sores, muscle weakness, suspicious skin lesions, transient blindness, difficulty walking, depression, unusual weight change, abnormal bleeding, enlarged lymph nodes, angioedema, and breast masses.    Physical Exam  General:  Well-developed,well-nourished,in no acute distress; alert,appropriate and cooperative  throughout examination Head:  normocephalic and atraumatic.   Eyes:  pupils equal, pupils round, and nystagmus.   Ears:  R ear normal and L ear  normal.   Nose:  no external deformity and no nasal discharge.   Mouth:  good dentition and pharynx pink and moist.   Neck:  supple and decreased ROM.   Lungs:  normal respiratory effort and no wheezes.   Heart:  normal rate and regular rhythm.   Abdomen:  Bowel sounds positive,abdomen soft and non-tender without masses, organomegaly or hernias noted. Msk:  No deformity or scoliosis noted of thoracic or lumbar spine.  heberdens nodes on index Neurologic:  No cranial nerve deficits noted. Station and gait are normal. Plantar reflexes are down-going bilaterally. DTRs are symmetrical throughout. Sensory, motor and coordinative functions appear intact.   Impression & Recommendations:  Problem # 1:  HYPERTENSION (ICD-401.9) Assessment Unchanged  Her updated medication list for this problem includes:    Diovan Hct 160-12.5 Mg Tabs (Valsartan-hydrochlorothiazide) ..... One by mouth daily  BP today: 130/80 Prior BP: 130/80 (03/28/2010)  Prior 10 Yr Risk Heart Disease: 7 % (12/28/2009)  Labs Reviewed: K+: 4.3 (12/21/2009) Creat: : 0.6 (12/21/2009)   Chol: 193 (09/08/2009)   HDL: 63.00 (09/08/2009)   LDL: 119 (09/08/2009)   TG: 54.0 (09/08/2009)  Problem # 2:  HYPOKALEMIA, MILD (ICD-276.8) moniter the effect of the diovan to see if it is as good as the avalide  Complete Medication List: 1)  Potassium Chloride Crys Cr 20 Meq Tbcr (Potassium chloride crys cr) .Marland Kitchen.. 1 every other day 2)  Re Dualvit Ob 162-115.2-1 Mg Caps (Prenat w/o a vit-fefum-fepo-fa) .... Once daily 3)  Benadryl Allergy 25 Mg Strp (Diphenhydramine hcl) .... 2 q am 4)  Fexofenadine-pseudoephedrine 60-120 Mg Xr12h-tab (Fexofenadine-pseudoephedrine) .... One by mouth bid 5)  Diovan Hct 160-12.5 Mg Tabs (Valsartan-hydrochlorothiazide) .... One by mouth daily 6)  Citracal Plus Tabs (Multiple minerals-vitamins) .Marland Kitchen.. 1 once daily 7)  Nasonex 50 Mcg/act Susp (Mometasone furoate) .... Two spray in each nare daily  Patient  Instructions: 1)  Please schedule a follow-up appointment in 2 months.

## 2010-12-11 NOTE — Assessment & Plan Note (Signed)
Summary: knee pain/bmw   Vital Signs:  Patient profile:   65 year old female Height:      65 inches Weight:      162 pounds BMI:     27.06 Temp:     98.2 degrees F oral Pulse rate:   92 / minute Resp:     14 per minute BP sitting:   146 / 80  (left arm)  Vitals Entered By: Willy Eddy, LPN (November 16, 2009 1:28 PM) CC: c/o left kne pain- c/o nocturnal leg cramps since starting hctz--states she is taking k+, Hypertension Management   CC:  c/o left kne pain- c/o nocturnal leg cramps since starting hctz--states she is taking k+ and Hypertension Management.  History of Present Illness: leg pain the pt thinks that the change from avalide to to avapro and hctz the pt has noted leg cramps the swelling in the legs have depressed she stopped the hctz and the cramps the pt has a high pulse and her blood pressure has not been well controlled. the cramps have stopped she noted increased fatigue  Hypertension History:      She denies headache, chest pain, palpitations, dyspnea with exertion, orthopnea, PND, peripheral edema, visual symptoms, neurologic problems, syncope, and side effects from treatment.        Positive major cardiovascular risk factors include female age 72 years old or older and hypertension.  Negative major cardiovascular risk factors include non-tobacco-user status.     Preventive Screening-Counseling & Management  Alcohol-Tobacco     Smoking Status: never  Problems Prior to Update: 1)  Hypokalemia, Mild  (ICD-276.8) 2)  Unspecified Disorder of Thyroid  (ICD-246.9) 3)  Syncope  (ICD-780.2) 4)  Viral Uri  (ICD-465.9) 5)  Preventive Health Care  (ICD-V70.0) 6)  Osteopenia  (ICD-733.90) 7)  Colonic Polyps, Hx of  (ICD-V12.72) 8)  Osteoarthritis  (ICD-715.90) 9)  Hypertension  (ICD-401.9) 10)  Allergic Rhinitis  (ICD-477.9)  Medications Prior to Update: 1)  Avapro 300 Mg Tabs (Irbesartan) .... One By Mouth Daily 2)  Potassium Chloride Crys Cr 20 Meq Tbcr  (Potassium Chloride Crys Cr) .Marland Kitchen.. 1 Once Daily 3)  Re Dualvit Ob 162-115.2-1 Mg  Caps (Prenat W/o A Vit-Fefum-Fepo-Fa) .... Once Daily 4)  Benadryl Allergy 25 Mg Strp (Diphenhydramine Hcl) .... 2 Q Am 5)  Fexofenadine-Pseudoephedrine 60-120 Mg Xr12h-Tab (Fexofenadine-Pseudoephedrine) .... One By Mouth Bid 6)  Hydrochlorothiazide 12.5 Mg Caps (Hydrochlorothiazide) .... One By Mouth Daily With The Avapro  Current Medications (verified): 1)  Avapro 300 Mg Tabs (Irbesartan) .... One By Mouth Daily 2)  Potassium Chloride Crys Cr 20 Meq Tbcr (Potassium Chloride Crys Cr) .Marland Kitchen.. 1 Once Daily 3)  Re Dualvit Ob 162-115.2-1 Mg  Caps (Prenat W/o A Vit-Fefum-Fepo-Fa) .... Once Daily 4)  Benadryl Allergy 25 Mg Strp (Diphenhydramine Hcl) .... 2 Q Am 5)  Fexofenadine-Pseudoephedrine 60-120 Mg Xr12h-Tab (Fexofenadine-Pseudoephedrine) .... One By Mouth Bid 6)  Hydrochlorothiazide 12.5 Mg Caps (Hydrochlorothiazide) .... One By Mouth Daily With The Avapro  Allergies (verified): 1)  Aspirin (Aspirin) 2)  Celebrex (Celecoxib) 3)  Sulfamethoxazole (Sulfamethoxazole) 4)  * Vioxx (Rofecoxib) 5)  Nsaids  Past History:  Family History: Last updated: 06/23/2007 Family History Hypertension Family History Other cancer-liver FAm hx CVA  Social History: Last updated: 06/10/2008 Never Smoked Alcohol use-no Drug use-no  Risk Factors: Smoking Status: never (11/16/2009)  Past medical, surgical, family and social histories (including risk factors) reviewed, and no changes noted (except as noted below).  Past Medical History: Reviewed  history from 12/11/2007 and no changes required. Allergic rhinitis Hypertension Osteoarthritis Colonic polyps, hx of  Past Surgical History: Reviewed history from 12/11/2007 and no changes required. Colonoscopy-04/28/2007 Flex Sigmoidoscopy-01/28/2002 Hysterectomy  Family History: Reviewed history from 06/23/2007 and no changes required. Family History Hypertension Family  History Other cancer-liver FAm hx CVA  Social History: Reviewed history from 06/10/2008 and no changes required. Never Smoked Alcohol use-no Drug use-no  Review of Systems  The patient denies anorexia, fever, weight loss, weight gain, vision loss, decreased hearing, hoarseness, chest pain, syncope, dyspnea on exertion, peripheral edema, prolonged cough, headaches, hemoptysis, abdominal pain, melena, hematochezia, severe indigestion/heartburn, hematuria, incontinence, genital sores, muscle weakness, suspicious skin lesions, transient blindness, difficulty walking, depression, unusual weight change, abnormal bleeding, enlarged lymph nodes, angioedema, and breast masses.    Physical Exam  General:  Well-developed,well-nourished,in no acute distress; alert,appropriate and cooperative throughout examination Head:  normocephalic and atraumatic.   Eyes:  pupils equal, pupils round, and nystagmus.   Ears:  R ear normal and L ear normal.   Nose:  no external deformity and no nasal discharge.   Mouth:  good dentition and pharynx pink and moist.   Neck:  supple and decreased ROM.   Lungs:  normal respiratory effort and no wheezes.   Heart:  normal rate and regular rhythm.   Abdomen:  Bowel sounds positive,abdomen soft and non-tender without masses, organomegaly or hernias noted.   Impression & Recommendations:  Problem # 1:  HYPOKALEMIA, MILD (ICD-276.8) due to the hctz now resolved  Problem # 2:  HYPERTENSION (ICD-401.9)  The following medications were removed from the medication list:    Avapro 300 Mg Tabs (Irbesartan) ..... One by mouth daily    Hydrochlorothiazide 12.5 Mg Caps (Hydrochlorothiazide) ..... One by mouth daily with the avapro Her updated medication list for this problem includes:    Bystolic 10 Mg Tabs (Nebivolol hcl) ..... One by mouth daily  BP today: 146/80 Prior BP: 130/76 (09/27/2009)  10 Yr Risk Heart Disease: 9 % Prior 10 Yr Risk Heart Disease: 7 %  (12/11/2007)  Labs Reviewed: K+: 4.3 (09/08/2009) Creat: : 0.7 (09/08/2009)   Chol: 193 (09/08/2009)   HDL: 63.00 (09/08/2009)   LDL: 119 (09/08/2009)   TG: 54.0 (09/08/2009)  Complete Medication List: 1)  Potassium Chloride Crys Cr 20 Meq Tbcr (Potassium chloride crys cr) .Marland Kitchen.. 1 once daily 2)  Re Dualvit Ob 162-115.2-1 Mg Caps (Prenat w/o a vit-fefum-fepo-fa) .... Once daily 3)  Benadryl Allergy 25 Mg Strp (Diphenhydramine hcl) .... 2 q am 4)  Fexofenadine-pseudoephedrine 60-120 Mg Xr12h-tab (Fexofenadine-pseudoephedrine) .... One by mouth bid 5)  Bystolic 10 Mg Tabs (Nebivolol hcl) .... One by mouth daily  Hypertension Assessment/Plan:      The patient's hypertensive risk group is category B: At least one risk factor (excluding diabetes) with no target organ damage.  Her calculated 10 year risk of coronary heart disease is 9 %.  Today's blood pressure is 146/80.  Her blood pressure goal is < 140/90.  Patient Instructions: 1)  take the potassium every other day for now 2)  Please schedule a follow-up appointment in 6 weeks. 3)  BMP prior to visit, ICD-9:401.90

## 2010-12-11 NOTE — Assessment & Plan Note (Signed)
Summary: 2 month f/up//db   Vital Signs:  Patient Profile:   65 Years Old Female Weight:      160 pounds Temp:     98.5 degrees F oral Pulse rate:   76 / minute Resp:     14 per minute BP sitting:   130 / 80  (left arm)  Vitals Entered By: Willy Eddy, LPN (December 05, 2008 3:53 PM)                 Chief Complaint:  ROA AND FORM COMPLETION.  History of Present Illness: discussion of the episodes of dizzyness and the work up that included carotid doplers and thyroid US the final diagnossis was viral labry=nthitis but the severity oif the symptoms necessitated the work jup for TIA and thyroid dz today she presents with arthritis and hand pain that has increased and is not responding to the gerneric etodolac. She alos complains of increased vasomotor rhinitis.    Prior Medication List:  AVALIDE 300-12.5 MG TABS (IRBESARTAN-HYDROCHLOROTHIAZIDE) one by mouth daily POTASSIUM CHLORIDE CRYS CR 20 MEQ TBCR (POTASSIUM CHLORIDE CRYS CR) 1 once daily NASONEX 50 MCG/ACT  SUSP (MOMETASONE FUROATE) once dailyhold DICLOFENAC SODIUM CR 100 MG  TB24 (DICLOFENAC SODIUM) one by mouth daily RE DUALVIT OB 162-115.2-1 MG  CAPS (PRENAT W/O A VIT-FEFUM-FEPO-FA) once daily ALLEGRA-D 24 HOUR 180-240 MG  XR24H-TAB (FEXOFENADINE-PSEUDOEPHEDRINE) one by mouth daily ANTIVERT 25 MG TABS (MECLIZINE HCL) 1 three times a day BPM PSEUDO 6-45 MG XR12H-TAB (BROMPHENIRAMINE-PSEUDOEPH) one by mouth BID PREDNISONE 20 MG TABS (PREDNISONE) two for 2 days then one for 2 days the 1/2 for 2 days   Current Allergies (reviewed today): ASPIRIN (ASPIRIN) CELEBREX (CELECOXIB) SULFAMETHOXAZOLE (SULFAMETHOXAZOLE) * VIOXX (ROFECOXIB)  Past Medical History:    Reviewed history from 12/11/2007 and no changes required:       Allergic rhinitis       Hypertension       Osteoarthritis       Colonic polyps, hx of  Past Surgical History:    Reviewed history from 12/11/2007 and no changes required:  Colonoscopy-04/28/2007       Flex Sigmoidoscopy-01/28/2002       Hysterectomy   Family History:    Reviewed history from 06/23/2007 and no changes required:       Family History Hypertension       Family History Other cancer-liver       FAm hx CVA  Social History:    Reviewed history from 06/10/2008 and no changes required:       Never Smoked       Alcohol use-no       Drug use-no   Risk Factors: Tobacco use:  never Drug use:  no Alcohol use:  no  Colonoscopy History:    Date of Last Colonoscopy:  04/28/2007  Mammogram History:    Date of Last Mammogram:  12/11/2007   Review of Systems  The patient denies anorexia, fever, weight loss, weight gain, vision loss, decreased hearing, hoarseness, chest pain, syncope, dyspnea on exertion, peripheral edema, prolonged cough, headaches, hemoptysis, abdominal pain, melena, hematochezia, severe indigestion/heartburn, hematuria, incontinence, genital sores, muscle weakness, suspicious skin lesions, transient blindness, difficulty walking, depression, unusual weight change, abnormal bleeding, enlarged lymph nodes, angioedema, and breast masses.         dizzy   Physical Exam  General:     Well-developed,well-nourished,in no acute distress; alert,appropriate and cooperative throughout examination Ears:     R ear normal and L ear normal.  Mouth:     pharynx pink and moist and no erythema.   Neck:     supple and decreased ROM.   Lungs:     normal respiratory effort and no wheezes.   Heart:     normal rate and regular rhythm.   Abdomen:     Bowel sounds positive,abdomen soft and non-tender without masses, organomegaly or hernias noted. Msk:     No deformity or scoliosis noted of thoracic or lumbar spine.  heberdens nodes on index    Impression & Recommendations:  Problem # 1:  UNSPECIFIED DISORDER OF THYROID (ICD-246.9) multinodular goiter Orders: Venipuncture (16109) TLB-TSH (Thyroid Stimulating Hormone) (84443-TSH)  TLB-T4 (Thyrox), Total (84436-T4) TLB-T3, Free (Triiodothyronine) (84481-T3FREE)   Problem # 2:  HYPERTENSION (ICD-401.9)  Her updated medication list for this problem includes:    Avalide 300-12.5 Mg Tabs (Irbesartan-hydrochlorothiazide) ..... One by mouth daily  BP today: 130/80 Prior BP: 116/72 (10/10/2008)  Prior 10 Yr Risk Heart Disease: 7 % (12/11/2007)  Labs Reviewed: Creat: 0.7 (06/03/2008) Chol: 193 (06/03/2008)   HDL: 45.6 (06/03/2008)   LDL: 135 (06/03/2008)   TG: 60 (06/03/2008)   Problem # 3:  ALLERGIC RHINITIS (ICD-477.9)  Her updated medication list for this problem includes:    Nasonex 50 Mcg/act Susp (Mometasone furoate) ..... Once dailyhold    Benadryl Allergy 25 Mg Strp (Diphenhydramine hcl) .Marland Kitchen... 2 q am Discussed use of allergy medications and environmental measures.   Problem # 4:  OSTEOARTHRITIS (ICD-715.90)  Her updated medication list for this problem includes:    Diclofenac Sodium Cr 100 Mg Tb24 (Diclofenac sodium) ..... One by mouth daily    Advil 200 Mg Caps (Ibuprofen) .Marland Kitchen..Marland Kitchen Two by mouth two times a day Discussed use of medications, application of heat or cold, and exercises.   Complete Medication List: 1)  Avalide 300-12.5 Mg Tabs (Irbesartan-hydrochlorothiazide) .... One by mouth daily 2)  Potassium Chloride Crys Cr 20 Meq Tbcr (Potassium chloride crys cr) .Marland Kitchen.. 1 once daily 3)  Nasonex 50 Mcg/act Susp (Mometasone furoate) .... Once dailyhold 4)  Diclofenac Sodium Cr 100 Mg Tb24 (Diclofenac sodium) .... One by mouth daily 5)  Re Dualvit Ob 162-115.2-1 Mg Caps (Prenat w/o a vit-fefum-fepo-fa) .... Once daily 6)  Benadryl Allergy 25 Mg Strp (Diphenhydramine hcl) .... 2 q am 7)  Advil 200 Mg Caps (Ibuprofen) .... Two by mouth two times a day   Patient Instructions: 1)  Please schedule a follow-up appointment in 3 months.

## 2010-12-11 NOTE — Letter (Signed)
Summary: [employer] Statement for [employer]  [employer]   Imported By: Maryln Gottron 10/12/2008 14:58:55  _____________________________________________________________________  External Attachment:    Type:   Image     Comment:   External Document

## 2010-12-11 NOTE — Assessment & Plan Note (Signed)
Summary: 6 month follow up/cjr   Vital Signs:  Patient profile:   65 year old female Height:      65 inches Weight:      178 pounds BMI:     29.73 Temp:     98.2 degrees F oral Pulse rate:   72 / minute Resp:     14 per minute BP sitting:   130 / 80  (left arm)  Vitals Entered By: Willy Eddy, LPN (Mar 28, 2010 10:25 AM) CC: roa bone density from february, Hypertension Management   CC:  roa bone density from february and Hypertension Management.  History of Present Illness: review of the  bone densities form 2008 and this year with stable results flair of allergies allegra and nasonex  when she has it  Hypertension History:      She denies headache, chest pain, palpitations, dyspnea with exertion, orthopnea, PND, peripheral edema, visual symptoms, neurologic problems, syncope, and side effects from treatment.        Positive major cardiovascular risk factors include female age 32 years old or older and hypertension.  Negative major cardiovascular risk factors include non-tobacco-user status.     Preventive Screening-Counseling & Management  Alcohol-Tobacco     Smoking Status: never  Problems Prior to Update: 1)  Hypokalemia, Mild  (ICD-276.8) 2)  Unspecified Disorder of Thyroid  (ICD-246.9) 3)  Syncope  (ICD-780.2) 4)  Viral Uri  (ICD-465.9) 5)  Preventive Health Care  (ICD-V70.0) 6)  Osteopenia  (ICD-733.90) 7)  Colonic Polyps, Hx of  (ICD-V12.72) 8)  Osteoarthritis  (ICD-715.90) 9)  Hypertension  (ICD-401.9) 10)  Allergic Rhinitis  (ICD-477.9)  Current Problems (verified): 1)  Hypokalemia, Mild  (ICD-276.8) 2)  Unspecified Disorder of Thyroid  (ICD-246.9) 3)  Syncope  (ICD-780.2) 4)  Viral Uri  (ICD-465.9) 5)  Preventive Health Care  (ICD-V70.0) 6)  Osteopenia  (ICD-733.90) 7)  Colonic Polyps, Hx of  (ICD-V12.72) 8)  Osteoarthritis  (ICD-715.90) 9)  Hypertension  (ICD-401.9) 10)  Allergic Rhinitis  (ICD-477.9)  Medications Prior to Update: 1)   Potassium Chloride Crys Cr 20 Meq Tbcr (Potassium Chloride Crys Cr) .Marland Kitchen.. 1 Once Daily 2)  Re Dualvit Ob 162-115.2-1 Mg  Caps (Prenat W/o A Vit-Fefum-Fepo-Fa) .... Once Daily 3)  Benadryl Allergy 25 Mg Strp (Diphenhydramine Hcl) .... 2 Q Am 4)  Fexofenadine-Pseudoephedrine 60-120 Mg Xr12h-Tab (Fexofenadine-Pseudoephedrine) .... One By Mouth Bid 5)  Bystolic 10 Mg Tabs (Nebivolol Hcl) .... One By Mouth Daily 6)  Citracal Plus  Tabs (Multiple Minerals-Vitamins) .Marland Kitchen.. 1 Once Daily  Current Medications (verified): 1)  Potassium Chloride Crys Cr 20 Meq Tbcr (Potassium Chloride Crys Cr) .Marland Kitchen.. 1 Every Other Day 2)  Re Dualvit Ob 162-115.2-1 Mg  Caps (Prenat W/o A Vit-Fefum-Fepo-Fa) .... Once Daily 3)  Benadryl Allergy 25 Mg Strp (Diphenhydramine Hcl) .... 2 Q Am 4)  Fexofenadine-Pseudoephedrine 60-120 Mg Xr12h-Tab (Fexofenadine-Pseudoephedrine) .... One By Mouth Bid 5)  Bystolic 10 Mg Tabs (Nebivolol Hcl) .... One By Mouth Daily 6)  Citracal Plus  Tabs (Multiple Minerals-Vitamins) .Marland Kitchen.. 1 Once Daily  Allergies (verified): 1)  Aspirin (Aspirin) 2)  Celebrex (Celecoxib) 3)  Sulfamethoxazole (Sulfamethoxazole) 4)  * Vioxx (Rofecoxib) 5)  Nsaids  Past History:  Family History: Last updated: 06/23/2007 Family History Hypertension Family History Other cancer-liver FAm hx CVA  Social History: Last updated: 06/10/2008 Never Smoked Alcohol use-no Drug use-no  Risk Factors: Smoking Status: never (03/28/2010)  Past medical, surgical, family and social histories (including risk factors) reviewed, and no changes  noted (except as noted below).  Past Medical History: Reviewed history from 12/11/2007 and no changes required. Allergic rhinitis Hypertension Osteoarthritis Colonic polyps, hx of  Past Surgical History: Reviewed history from 12/11/2007 and no changes required. Colonoscopy-04/28/2007 Flex Sigmoidoscopy-01/28/2002 Hysterectomy  Family History: Reviewed history from 06/23/2007 and  no changes required. Family History Hypertension Family History Other cancer-liver FAm hx CVA  Social History: Reviewed history from 06/10/2008 and no changes required. Never Smoked Alcohol use-no Drug use-no  Review of Systems  The patient denies anorexia, fever, weight loss, weight gain, vision loss, decreased hearing, hoarseness, chest pain, syncope, dyspnea on exertion, peripheral edema, prolonged cough, headaches, hemoptysis, abdominal pain, melena, hematochezia, severe indigestion/heartburn, hematuria, incontinence, genital sores, muscle weakness, suspicious skin lesions, transient blindness, difficulty walking, depression, unusual weight change, abnormal bleeding, enlarged lymph nodes, angioedema, and breast masses.    Physical Exam  General:  Well-developed,well-nourished,in no acute distress; alert,appropriate and cooperative throughout examination Head:  normocephalic and atraumatic.   Eyes:  pupils equal, pupils round, and nystagmus.   Ears:  R ear normal and L ear normal.   Mouth:  good dentition and pharynx pink and moist.   Neck:  supple and decreased ROM.   Lungs:  normal respiratory effort and no wheezes.   Heart:  normal rate and regular rhythm.   Abdomen:  Bowel sounds positive,abdomen soft and non-tender without masses, organomegaly or hernias noted. Msk:  No deformity or scoliosis noted of thoracic or lumbar spine.  heberdens nodes on index   Impression & Recommendations:  Problem # 1:  HYPERTENSION (ICD-401.9) Assessment Unchanged  weigth is up 10 lbs Her updated medication list for this problem includes:    Bystolic 10 Mg Tabs (Nebivolol hcl) ..... One by mouth daily  BP today: 130/80 Prior BP: 130/80 (12/28/2009)  Prior 10 Yr Risk Heart Disease: 7 % (12/28/2009)  Labs Reviewed: K+: 4.3 (12/21/2009) Creat: : 0.6 (12/21/2009)   Chol: 193 (09/08/2009)   HDL: 63.00 (09/08/2009)   LDL: 119 (09/08/2009)   TG: 54.0 (09/08/2009)  Problem # 2:  ALLERGIC  RHINITIS (ICD-477.9) Assessment: Deteriorated  Her updated medication list for this problem includes:    Benadryl Allergy 25 Mg Strp (Diphenhydramine hcl) .Marland Kitchen... 2 q am    Nasonex 50 Mcg/act Susp (Mometasone furoate) .Marland Kitchen..Marland Kitchen Two spray in each nare daily  Discussed use of allergy medications and environmental measures.   Problem # 3:  OSTEOARTHRITIS (ICD-715.90) jont pian is minimal and needs to exercize Discussed use of medications, application of heat or cold, and exercises.   Complete Medication List: 1)  Potassium Chloride Crys Cr 20 Meq Tbcr (Potassium chloride crys cr) .Marland Kitchen.. 1 every other day 2)  Re Dualvit Ob 162-115.2-1 Mg Caps (Prenat w/o a vit-fefum-fepo-fa) .... Once daily 3)  Benadryl Allergy 25 Mg Strp (Diphenhydramine hcl) .... 2 q am 4)  Fexofenadine-pseudoephedrine 60-120 Mg Xr12h-tab (Fexofenadine-pseudoephedrine) .... One by mouth bid 5)  Bystolic 10 Mg Tabs (Nebivolol hcl) .... One by mouth daily 6)  Citracal Plus Tabs (Multiple minerals-vitamins) .Marland Kitchen.. 1 once daily 7)  Nasonex 50 Mcg/act Susp (Mometasone furoate) .... Two spray in each nare daily  Hypertension Assessment/Plan:      The patient's hypertensive risk group is category B: At least one risk factor (excluding diabetes) with no target organ damage.  Her calculated 10 year risk of coronary heart disease is 7 %.  Today's blood pressure is 130/80.  Her blood pressure goal is < 140/90.  Patient Instructions: 1)  NOV  for CPX

## 2010-12-11 NOTE — Progress Notes (Signed)
Summary: feet & legs swelling. ?Bystolic j pt  Phone Note Call from Patient Call back at [facility] Phone [phone]   Summary of Call: Bystolic 1 month ago.  Feet & legs beginning to swell.   Initial call taken by: Rudy Jew, RN,  Apr 10, 2010 12:19 PM  Follow-up for Phone Call        would be an unusual side effect stay on same meds OV next available Follow-up by: Birdie Sons MD,  Apr 10, 2010 3:05 PM  Additional Follow-up for Phone Call Additional follow up Details #1::        Phone Call Completed Additional Follow-up by: Rudy Jew, RN,  Apr 10, 2010 4:29 PM

## 2010-12-11 NOTE — Progress Notes (Signed)
Summary: work in ov today knee pain/swelling  Phone Note Call from Patient Call back at [employer] [phone] Call back at [phone]   Caller: Patient Call For: Stacie Glaze MD Reason for Call: Acute Illness Summary of Call: left knee pain with swelling, pt would like ov today Initial call taken by: Heron Sabins,  November 15, 2009 9:37 AM  Follow-up for Phone Call        ov given for tomorrow Follow-up by: Willy Eddy, LPN,  November 15, 2009 9:49 AM

## 2011-01-18 ENCOUNTER — Other Ambulatory Visit: Payer: Self-pay | Admitting: Internal Medicine

## 2011-05-17 ENCOUNTER — Other Ambulatory Visit: Payer: Self-pay | Admitting: Internal Medicine

## 2011-09-24 ENCOUNTER — Other Ambulatory Visit: Payer: Self-pay | Admitting: *Deleted

## 2011-09-24 MED ORDER — SE-TAN PLUS 162-115.2-1 MG PO CAPS: 162.0000 mg | ORAL_CAPSULE | Freq: Every day | ORAL | Status: DC

## 2011-10-25 ENCOUNTER — Other Ambulatory Visit: Payer: Self-pay | Admitting: Internal Medicine

## 2011-12-25 ENCOUNTER — Encounter: Payer: Self-pay | Admitting: Internal Medicine

## 2011-12-31 ENCOUNTER — Other Ambulatory Visit: Payer: Self-pay | Admitting: *Deleted

## 2011-12-31 MED ORDER — IRBESARTAN-HYDROCHLOROTHIAZIDE 300-12.5 MG PO TABS: 1.0000 | ORAL_TABLET | Freq: Every day | ORAL | Status: DC

## 2012-02-24 ENCOUNTER — Telehealth: Payer: Self-pay | Admitting: *Deleted

## 2012-02-24 NOTE — Telephone Encounter (Signed)
Pt. States she started the Avalide on the 25 th of March, and now she has been having swelling of legs and feet x 2 weeks with minimal SOB (she thinks is related to allergies), but no chest pain or other symptoms.  Has app with Dr. Lovell Sheehan the 24 th of this month.

## 2012-02-24 NOTE — Telephone Encounter (Signed)
Pt will go to a pharmacy tomorrow, and get her BP checked.

## 2012-02-24 NOTE — Telephone Encounter (Signed)
Ask what bp is and if ok may continue until appointment

## 2012-02-26 ENCOUNTER — Telehealth: Payer: Self-pay | Admitting: *Deleted

## 2012-02-26 DIAGNOSIS — I10 Essential (primary) hypertension: Secondary | ICD-10-CM

## 2012-02-26 NOTE — Telephone Encounter (Addendum)
170/95  106  Rested 5 minutes   150/105   Pulse 93  See previous phone note.  Pt was instructed to get her BP checked per Dr. Lovell Sheehan in order to make a decision on whether to stay on the Avalide?

## 2012-02-27 MED ORDER — ATENOLOL-CHLORTHALIDONE 50-25 MG PO TABS: 1.0000 | ORAL_TABLET | Freq: Every day | ORAL | Status: DC

## 2012-02-27 NOTE — Telephone Encounter (Signed)
Stop avaldie and new med tenoretic sent in by dr Lovell Sheehan

## 2012-02-27 NOTE — Telephone Encounter (Signed)
Notified pt. 

## 2012-03-04 ENCOUNTER — Ambulatory Visit (INDEPENDENT_AMBULATORY_CARE_PROVIDER_SITE_OTHER): Payer: Medicare Other | Admitting: Internal Medicine

## 2012-03-04 ENCOUNTER — Encounter: Payer: Self-pay | Admitting: Internal Medicine

## 2012-03-04 VITALS — BP 140/80 | HR 72 | Temp 98.3°F | Resp 16 | Ht 65.5 in | Wt 174.0 lb

## 2012-03-04 DIAGNOSIS — G4700 Insomnia, unspecified: Secondary | ICD-10-CM | POA: Diagnosis not present

## 2012-03-04 DIAGNOSIS — Z0000 Encounter for general adult medical examination without abnormal findings: Secondary | ICD-10-CM

## 2012-03-04 DIAGNOSIS — T887XXA Unspecified adverse effect of drug or medicament, initial encounter: Secondary | ICD-10-CM

## 2012-03-04 DIAGNOSIS — E785 Hyperlipidemia, unspecified: Secondary | ICD-10-CM

## 2012-03-04 DIAGNOSIS — I10 Essential (primary) hypertension: Secondary | ICD-10-CM | POA: Diagnosis not present

## 2012-03-04 LAB — BASIC METABOLIC PANEL
BUN: 19 mg/dL (ref 6–23)
CO2: 32 mEq/L (ref 19–32)
Calcium: 9.5 mg/dL (ref 8.4–10.5)
Chloride: 96 mEq/L (ref 96–112)
Creatinine, Ser: 0.9 mg/dL (ref 0.4–1.2)
GFR: 83.45 mL/min (ref >60.00)
Glucose, Bld: 204 mg/dL — ABNORMAL HIGH (ref 70–99)
Potassium: 3.6 mEq/L (ref 3.5–5.1)
Sodium: 139 mEq/L (ref 135–145)

## 2012-03-04 LAB — LDL CHOLESTEROL, DIRECT: Direct LDL: 121.7 mg/dL

## 2012-03-04 LAB — HEPATIC FUNCTION PANEL
ALT: 21 U/L (ref 0–35)
AST: 22 U/L (ref 0–37)
Albumin: 4.3 g/dL (ref 3.5–5.2)
Alkaline Phosphatase: 105 U/L (ref 39–117)
Bilirubin, Direct: 0.1 mg/dL (ref 0.0–0.3)
Total Bilirubin: 0.5 mg/dL (ref 0.3–1.2)
Total Protein: 8.3 g/dL (ref 6.0–8.3)

## 2012-03-04 LAB — CBC WITH DIFFERENTIAL/PLATELET
Basophils Absolute: 0 10*3/uL (ref 0.0–0.1)
Basophils Relative: 0.3 % (ref 0.0–3.0)
Eosinophils Absolute: 0.2 10*3/uL (ref 0.0–0.7)
Eosinophils Relative: 2.1 % (ref 0.0–5.0)
HCT: 39.3 % (ref 36.0–46.0)
Hemoglobin: 12.9 g/dL (ref 12.0–15.0)
Lymphocytes Relative: 39.3 % (ref 12.0–46.0)
Lymphs Abs: 3.2 10*3/uL (ref 0.7–4.0)
MCHC: 32.9 g/dL (ref 30.0–36.0)
MCV: 81.5 fl (ref 78.0–100.0)
Monocytes Absolute: 0.6 10*3/uL (ref 0.1–1.0)
Monocytes Relative: 7.2 % (ref 3.0–12.0)
Neutro Abs: 4.2 10*3/uL (ref 1.4–7.7)
Neutrophils Relative %: 51.1 % (ref 43.0–77.0)
Platelets: 257 10*3/uL (ref 150.0–400.0)
RBC: 4.82 Mil/uL (ref 3.87–5.11)
RDW: 12.8 % (ref 11.5–14.6)
WBC: 8.2 10*3/uL (ref 4.5–10.5)

## 2012-03-04 LAB — LIPID PANEL
Cholesterol: 205 mg/dL — ABNORMAL HIGH (ref 0–200)
HDL: 63.4 mg/dL (ref >39.00)
Total CHOL/HDL Ratio: 3
Triglycerides: 108 mg/dL (ref 0.0–149.0)
VLDL: 21.6 mg/dL (ref 0.0–40.0)

## 2012-03-04 LAB — TSH: TSH: 2.95 u[IU]/mL (ref 0.35–5.50)

## 2012-03-04 NOTE — Progress Notes (Signed)
Subjective:    Patient ID: Pam Obrien, female    DOB: 1946-02-06, 66 y.o.   MRN: [id number]  HPI  Presents for Medicare wellness examination.  She has a history of low potassium, controlled hypertension, a history of osteoarthritis and a history of esophageal reflux or GERD.  She also has mild to moderate seasonal allergic rhinitis  Review of Systems  Constitutional: Negative for activity change, appetite change and fatigue.  HENT: Negative for ear pain, congestion, neck pain, postnasal drip and sinus pressure.   Eyes: Negative for redness and visual disturbance.  Respiratory: Negative for cough, shortness of breath and wheezing.   Gastrointestinal: Negative for abdominal pain and abdominal distention.  Genitourinary: Negative for dysuria, frequency and menstrual problem.  Musculoskeletal: Negative for myalgias, joint swelling and arthralgias.  Skin: Negative for rash and wound.  Neurological: Negative for dizziness, weakness and headaches.  Hematological: Negative for adenopathy. Does not bruise/bleed easily.  Psychiatric/Behavioral: Negative for sleep disturbance and decreased concentration.   Past Medical History  Diagnosis Date  . Allergy   . Hyperlipidemia   . Arthritis     History   Social History  . Marital Status: Married    Spouse Name: N/A    Number of Children: N/A  . Years of Education: N/A   Occupational History  . Not on file.   Social History Main Topics  . Smoking status: Never Smoker   . Smokeless tobacco: Not on file  . Alcohol Use: No  . Drug Use: No  . Sexually Active: Not on file   Other Topics Concern  . Not on file   Social History Narrative  . No narrative on file    Past Surgical History  Procedure Date  . Colonoscopy 2008  . Flexible sigmoidoscopy 2003  . Abdominal hysterectomy     Family History  Problem Relation Age of Onset  . Hypertension    . Liver cancer    . Stroke      Allergies  Allergen Reactions  .  Aspirin     REACTION: nervous  . Celecoxib     REACTION: joints swell  . Nsaids     REACTION: sweling  . Sulfamethoxazole     REACTION: urticaria (hives)    Current Outpatient Prescriptions on File Prior to Visit  Medication Sig Dispense Refill  . atenolol-chlorthalidone (TENORETIC 50) 50-25 MG per tablet Take 1 tablet by mouth daily.  30 tablet  6  . calcium citrate-vitamin D (CITRACAL+D) 315-200 MG-UNIT per tablet Take 1 tablet by mouth daily.      . diphenhydrAMINE (SOMINEX) 25 MG tablet Take 50 mg by mouth daily.      Marland Kitchen FeFum-FePo-FA-B Cmp-C-Zn-Mn-Cu (SE-TAN PLUS) 162-115.2-1 MG CAPS Take 162 mg by mouth daily.  30 capsule  11  . DISCONTD: KLOR-CON M20 20 MEQ tablet TAKE 1 TABLET EVERY DAY  30 tablet  4    BP 140/80  Pulse 72  Temp 98.3 F (36.8 C)  Resp 16  Ht 5' 5.5" (1.664 m)  Wt 174 lb (78.926 kg)  BMI 28.51 kg/m2        Objective:   Physical Exam  Constitutional: She is oriented to person, place, and time. She appears well-developed and well-nourished. No distress.  HENT:  Head: Normocephalic and atraumatic.  Right Ear: External ear normal.  Left Ear: External ear normal.  Nose: Nose normal.  Mouth/Throat: Oropharynx is clear and moist.  Eyes: Conjunctivae and EOM are normal. Pupils are equal, round, and reactive  to light.  Neck: Normal range of motion. Neck supple. No JVD present. No tracheal deviation present. No thyromegaly present.  Cardiovascular: Normal rate, regular rhythm, normal heart sounds and intact distal pulses.   No murmur heard. Pulmonary/Chest: Effort normal and breath sounds normal. She has no wheezes. She exhibits no tenderness.  Abdominal: Soft. Bowel sounds are normal.  Musculoskeletal: Normal range of motion. She exhibits no edema and no tenderness.  Lymphadenopathy:    She has no cervical adenopathy.  Neurological: She is alert and oriented to person, place, and time. She has normal reflexes. No cranial nerve deficit.  Skin: Skin is  warm and dry. She is not diaphoretic.  Psychiatric: She has a normal mood and affect. Her behavior is normal.          Assessment & Plan:  Patient presents for Medicare wellness examination she is followed for multiple medical problems including hypertension allergic rhinitis and gastroesophageal reflux she is stable on her medications for hypertension her esophageal reflux and allergic rhinitis.  She is mild to moderate osteoarthritis for which is excuse the use of Tylenol and exercise she is intolerant of many nonsteroidals including Celebrex and aspirin.   Subjective:    Pam Obrien is a 66 y.o. female who presents for Medicare Annual/Subsequent preventive examination.  Preventive Screening-Counseling & Management  Tobacco History  Smoking status  . Never Smoker   Smokeless tobacco  . Not on file     Problems Prior to Visit 1.   Current Problems (verified) Patient Active Problem List  Diagnosis  . UNSPECIFIED DISORDER OF THYROID  . HYPOKALEMIA, MILD  . HYPERTENSION  . VIRAL URI  . ALLERGIC RHINITIS  . ESOPHAGEAL REFLUX  . OSTEOARTHRITIS  . OSTEOPENIA  . SYNCOPE  . COLONIC POLYPS, HX OF    Medications Prior to Visit Current Outpatient Prescriptions on File Prior to Visit  Medication Sig Dispense Refill  . atenolol-chlorthalidone (TENORETIC 50) 50-25 MG per tablet Take 1 tablet by mouth daily.  30 tablet  6  . calcium citrate-vitamin D (CITRACAL+D) 315-200 MG-UNIT per tablet Take 1 tablet by mouth daily.      . diphenhydrAMINE (SOMINEX) 25 MG tablet Take 50 mg by mouth daily.      Marland Kitchen FeFum-FePo-FA-B Cmp-C-Zn-Mn-Cu (SE-TAN PLUS) 162-115.2-1 MG CAPS Take 162 mg by mouth daily.  30 capsule  11  . KLOR-CON M20 20 MEQ tablet TAKE 1 TABLET EVERY DAY  30 tablet  4    Current Medications (verified) Current Outpatient Prescriptions  Medication Sig Dispense Refill  . atenolol-chlorthalidone (TENORETIC 50) 50-25 MG per tablet Take 1 tablet by mouth daily.  30  tablet  6  . calcium citrate-vitamin D (CITRACAL+D) 315-200 MG-UNIT per tablet Take 1 tablet by mouth daily.      . cholecalciferol (VITAMIN D) 1000 UNITS tablet Take 2,000 Units by mouth daily.      . diphenhydrAMINE (SOMINEX) 25 MG tablet Take 50 mg by mouth daily.      Marland Kitchen FeFum-FePo-FA-B Cmp-C-Zn-Mn-Cu (SE-TAN PLUS) 162-115.2-1 MG CAPS Take 162 mg by mouth daily.  30 capsule  11  . fexofenadine-pseudoephedrine (ALLEGRA-D) 60-120 MG per tablet Take 1 tablet by mouth 2 (two) times daily.      . potassium chloride SA (KLOR-CON M20) 20 MEQ tablet       . KLOR-CON M20 20 MEQ tablet TAKE 1 TABLET EVERY DAY  30 tablet  4     Allergies (verified) Aspirin; Celecoxib; Nsaids; and Sulfamethoxazole   PAST HISTORY  Family History Family History  Problem Relation Age of Onset  . Hypertension    . Liver cancer    . Stroke      Social History History  Substance Use Topics  . Smoking status: Never Smoker   . Smokeless tobacco: Not on file  . Alcohol Use: No     Are there smokers in your home (other than you)? No  Risk Factors Current exercise habits: The patient does not participate in regular exercise at present.  Dietary issues discussed:  Weight reduction    Cardiac risk factors: advanced age (older than 52 for men, 55 for women), dyslipidemia and sedentary lifestyle.  Depression Screen (Note: if answer to either of the following is "Yes", a more complete depression screening is indicated)   Over the past two weeks, have you felt down, depressed or hopeless? No  Over the past two weeks, have you felt little interest or pleasure in doing things? No  Have you lost interest or pleasure in daily life? No  Do you often feel hopeless? No  Do you cry easily over simple problems? No  Activities of Daily Living In your present state of health, do you have any difficulty performing the following activities?:  Driving? No Managing money?  No Feeding yourself? No Getting from bed to  chair? No Climbing a flight of stairs? No Preparing food and eating?: no  Bathing or showering? No Getting dressed: No Getting to the toilet? No Using the toilet:No Moving around from place to place: No In the past year have you fallen or had a near fall?:No   Are you sexually active?  Yes  Do you have more than one partner?  No  Hearing Difficulties: No Do you often ask people to speak up or repeat themselves? No Do you experience ringing or noises in your ears? No Do you have difficulty understanding soft or whispered voices? No   Do you feel that you have a problem with memory? No  Do you often misplace items? No  Do you feel safe at home?  No  Cognitive Testing  Alert? Yes  Normal Appearance?Yes  Oriented to person? Yes  Place? Yes   Time? Yes  Recall of three objects?  Yes  Can perform simple calculations? Yes  Displays appropriate judgment?Yes  Can read the correct time from a watch face?Yes   Advanced Directives have been discussed with the patient? Yes  List the Names of Other Physician/Practitioners you currently use: 1.    Indicate any recent Medical Services you may have received from other than [facility] providers in the past year (date may be approximate).  Immunization History  Administered Date(s) Administered  . Pneumococcal Polysaccharide 12/28/2009  . Td 11/11/2006    Screening Tests Health Maintenance  Topic Date Due  . Colonoscopy  11/20/1994  . Zostavax  11/20/2004  . Mammogram  03/04/2011  . Influenza Vaccine  07/12/2012  . Tetanus/tdap  11/11/2016  . Pneumococcal Polysaccharide Vaccine Age 80 And Over  Completed    All answers were reviewed with the patient and necessary referrals were made:  Carrie Mew, MD   08/06/2012   History reviewed: allergies, current medications, past family history, past medical history, past social history, past surgical history and problem list  Review of Systems A comprehensive review of systems was  negative except for: Musculoskeletal: positive for arthralgias    Objective:     Vision by Snellen chart: right eye:20/20, left eye:20/20  Body mass index is 28.51  kg/(m^2). BP 140/80  Pulse 72  Temp 98.3 F (36.8 C)  Resp 16  Ht 5' 5.5" (1.664 m)  Wt 174 lb (78.926 kg)  BMI 28.51 kg/m2  BP 140/80  Pulse 72  Temp 98.3 F (36.8 C)  Resp 16  Ht 5' 5.5" (1.664 m)  Wt 174 lb (78.926 kg)  BMI 28.51 kg/m2  General Appearance:    Alert, cooperative, no distress, appears stated age  Head:    Normocephalic, without obvious abnormality, atraumatic  Eyes:    PERRL, conjunctiva/corneas clear, EOM's intact, fundi    benign, both eyes  Ears:    Normal TM's and external ear canals, both ears  Nose:   Nares normal, septum midline, mucosa normal, no drainage    or sinus tenderness  Throat:   Lips, mucosa, and tongue normal; teeth and gums normal  Neck:   Supple, symmetrical, trachea midline, no adenopathy;    thyroid:  no enlargement/tenderness/nodules; no carotid   bruit or JVD  Back:     Symmetric, no curvature, ROM normal, no CVA tenderness  Lungs:     Clear to auscultation bilaterally, respirations unlabored  Chest Wall:    No tenderness or deformity   Heart:    Regular rate and rhythm, S1 and S2 normal, no murmur, rub   or gallop  Breast Exam:    No tenderness, masses, or nipple abnormality  Abdomen:     Soft, non-tender, bowel sounds active all four quadrants,    no masses, no organomegaly  Genitalia:    Normal female without lesion, discharge or tenderness  Rectal:    Normal tone, normal prostate, no masses or tenderness;   guaiac negative stool  Extremities:   Extremities normal, atraumatic, no cyanosis or edema  Pulses:   2+ and symmetric all extremities  Skin:   Skin color, texture, turgor normal, no rashes or lesions  Lymph nodes:   Cervical, supraclavicular, and axillary nodes normal  Neurologic:   CNII-XII intact, normal strength, sensation and reflexes    throughout        Assessment:     This is a routine physical examination for this healthy  Female. Reviewed all health maintenance protocols including mammography colonoscopy bone density and reviewed appropriate screening labs. Her immunization history was reviewed as well as her current medications and allergies refills of her chronic medications were given and the plan for yearly health maintenance was discussed all orders and referrals were made as appropriate.      Plan:     During the course of the visit the patient was educated and counseled about appropriate screening and preventive services including:    Influenza vaccine  Screening mammography  Bone densitometry screening  Diet review for nutrition referral? Yes ____  Not Indicated ____   Patient Instructions (the written plan) was given to the patient.  Medicare Attestation I have personally reviewed: The patient's medical and social history Their use of alcohol, tobacco or illicit drugs Their current medications and supplements The patient's functional ability including ADLs,fall risks, home safety risks, cognitive, and hearing and visual impairment Diet and physical activities Evidence for depression or mood disorders  The patient's weight, height, BMI, and visual acuity have been recorded in the chart.  I have made referrals, counseling, and provided education to the patient based on review of the above and I have provided the patient with a written personalized care plan for preventive services.     Carrie Mew, MD   08/06/2012

## 2012-03-27 ENCOUNTER — Other Ambulatory Visit: Payer: Self-pay | Admitting: Internal Medicine

## 2012-05-08 DIAGNOSIS — J309 Allergic rhinitis, unspecified: Secondary | ICD-10-CM | POA: Diagnosis not present

## 2012-05-22 DIAGNOSIS — J309 Allergic rhinitis, unspecified: Secondary | ICD-10-CM | POA: Diagnosis not present

## 2012-06-05 DIAGNOSIS — J309 Allergic rhinitis, unspecified: Secondary | ICD-10-CM | POA: Diagnosis not present

## 2012-06-12 DIAGNOSIS — J309 Allergic rhinitis, unspecified: Secondary | ICD-10-CM | POA: Diagnosis not present

## 2012-06-19 DIAGNOSIS — J309 Allergic rhinitis, unspecified: Secondary | ICD-10-CM | POA: Diagnosis not present

## 2012-06-29 DIAGNOSIS — J309 Allergic rhinitis, unspecified: Secondary | ICD-10-CM | POA: Diagnosis not present

## 2012-06-29 DIAGNOSIS — J301 Allergic rhinitis due to pollen: Secondary | ICD-10-CM | POA: Diagnosis not present

## 2012-06-29 DIAGNOSIS — J3081 Allergic rhinitis due to animal (cat) (dog) hair and dander: Secondary | ICD-10-CM | POA: Diagnosis not present

## 2012-06-29 DIAGNOSIS — J3089 Other allergic rhinitis: Secondary | ICD-10-CM | POA: Diagnosis not present

## 2012-06-29 DIAGNOSIS — H1045 Other chronic allergic conjunctivitis: Secondary | ICD-10-CM | POA: Diagnosis not present

## 2012-07-09 DIAGNOSIS — J309 Allergic rhinitis, unspecified: Secondary | ICD-10-CM | POA: Diagnosis not present

## 2012-07-17 DIAGNOSIS — J309 Allergic rhinitis, unspecified: Secondary | ICD-10-CM | POA: Diagnosis not present

## 2012-07-31 DIAGNOSIS — J309 Allergic rhinitis, unspecified: Secondary | ICD-10-CM | POA: Diagnosis not present

## 2012-08-06 ENCOUNTER — Encounter: Payer: Self-pay | Admitting: Internal Medicine

## 2012-08-06 NOTE — Patient Instructions (Signed)
The patient is instructed to continue all medications as prescribed. Schedule followup with check out clerk upon leaving the clinic  

## 2012-08-07 DIAGNOSIS — J309 Allergic rhinitis, unspecified: Secondary | ICD-10-CM | POA: Diagnosis not present

## 2012-08-28 DIAGNOSIS — J309 Allergic rhinitis, unspecified: Secondary | ICD-10-CM | POA: Diagnosis not present

## 2012-09-03 ENCOUNTER — Ambulatory Visit (INDEPENDENT_AMBULATORY_CARE_PROVIDER_SITE_OTHER): Payer: Medicare Other | Admitting: Internal Medicine

## 2012-09-03 ENCOUNTER — Encounter: Payer: Self-pay | Admitting: Internal Medicine

## 2012-09-03 VITALS — BP 130/80 | HR 72 | Temp 98.2°F | Resp 16 | Ht 65.5 in | Wt 161.0 lb

## 2012-09-03 DIAGNOSIS — I10 Essential (primary) hypertension: Secondary | ICD-10-CM

## 2012-09-03 DIAGNOSIS — J309 Allergic rhinitis, unspecified: Secondary | ICD-10-CM | POA: Diagnosis not present

## 2012-09-03 DIAGNOSIS — E876 Hypokalemia: Secondary | ICD-10-CM

## 2012-09-03 DIAGNOSIS — Z23 Encounter for immunization: Secondary | ICD-10-CM | POA: Diagnosis not present

## 2012-09-03 DIAGNOSIS — J45909 Unspecified asthma, uncomplicated: Secondary | ICD-10-CM | POA: Diagnosis not present

## 2012-09-03 LAB — BASIC METABOLIC PANEL
BUN: 14 mg/dL (ref 6–23)
CO2: 32 mEq/L (ref 19–32)
Calcium: 9.3 mg/dL (ref 8.4–10.5)
Chloride: 93 mEq/L — ABNORMAL LOW (ref 96–112)
Creatinine, Ser: 0.8 mg/dL (ref 0.4–1.2)
GFR: 89.22 mL/min (ref >60.00)
Glucose, Bld: 360 mg/dL — ABNORMAL HIGH (ref 70–99)
Potassium: 3.4 mEq/L — ABNORMAL LOW (ref 3.5–5.1)
Sodium: 136 mEq/L (ref 135–145)

## 2012-09-03 MED ORDER — DIPHENHYDRAMINE HCL 25 MG PO TABS: 25.0000 mg | ORAL_TABLET | Freq: Four times a day (QID) | ORAL | Status: DC | PRN

## 2012-09-03 MED ORDER — LORATADINE 10 MG PO TABS: 10.0000 mg | ORAL_TABLET | Freq: Every day | ORAL | Status: DC

## 2012-09-03 NOTE — Progress Notes (Signed)
Subjective:    Patient ID: Pam Obrien, female    DOB: July 10, 1946, 66 y.o.   MRN: [id number]  HPI  This is a 66 year old female who presents for followup of hypertension be changed to atenolol chlorthalidone.  She is doing well off her ARB class drugs and has had a good blood pressure response to the combination medication we'll also monitor hypokalemia and she is on potassium chloride 20 mEq every other day.  She has moderate to severe allergic rhinitis and is followed by an allergist she takes Allegra and Benadryl and is experienced dry mouth  Review of Systems  Constitutional: Negative for activity change, appetite change and fatigue.  HENT: Positive for rhinorrhea, sneezing, postnasal drip and sinus pressure. Negative for ear pain, congestion and neck pain.   Eyes: Negative for redness and visual disturbance.  Respiratory: Negative for cough, shortness of breath and wheezing.   Gastrointestinal: Negative for abdominal pain and abdominal distention.  Genitourinary: Negative for dysuria, frequency and menstrual problem.  Musculoskeletal: Negative for myalgias, joint swelling and arthralgias.  Skin: Negative for rash and wound.  Neurological: Negative for dizziness, weakness and headaches.  Hematological: Negative for adenopathy. Does not bruise/bleed easily.  Psychiatric/Behavioral: Negative for disturbed wake/sleep cycle and decreased concentration.   Past Medical History  Diagnosis Date  . Allergy   . Hyperlipidemia   . Arthritis     History   Social History  . Marital Status: Married    Spouse Name: N/A    Number of Children: N/A  . Years of Education: N/A   Occupational History  . Not on file.   Social History Main Topics  . Smoking status: Never Smoker   . Smokeless tobacco: Not on file  . Alcohol Use: No  . Drug Use: No  . Sexually Active: Not on file   Other Topics Concern  . Not on file   Social History Narrative  . No narrative on file     Past Surgical History  Procedure Date  . Colonoscopy 2008  . Flexible sigmoidoscopy 2003  . Abdominal hysterectomy     Family History  Problem Relation Age of Onset  . Hypertension    . Liver cancer    . Stroke      Allergies  Allergen Reactions  . Aspirin     REACTION: nervous  . Celecoxib     REACTION: joints swell  . Nsaids     REACTION: sweling  . Sulfamethoxazole     REACTION: urticaria (hives)    Current Outpatient Prescriptions on File Prior to Visit  Medication Sig Dispense Refill  . atenolol-chlorthalidone (TENORETIC 50) 50-25 MG per tablet Take 1 tablet by mouth daily.  30 tablet  6  . calcium citrate-vitamin D (CITRACAL+D) 315-200 MG-UNIT per tablet Take 1 tablet by mouth daily.      . cholecalciferol (VITAMIN D) 1000 UNITS tablet Take 2,000 Units by mouth daily.      Marland Kitchen FeFum-FePo-FA-B Cmp-C-Zn-Mn-Cu (SE-TAN PLUS) 162-115.2-1 MG CAPS Take 162 mg by mouth daily.  30 capsule  11  . KLOR-CON M20 20 MEQ tablet TAKE 1 TABLET EVERY DAY  30 tablet  4  . potassium chloride SA (KLOR-CON M20) 20 MEQ tablet       . loratadine (CLARITIN) 10 MG tablet Take 1 tablet (10 mg total) by mouth daily.  30 tablet  11    BP 130/80  Pulse 72  Temp 98.2 F (36.8 C)  Resp 16  Ht 5' 5.5" (1.664 m)  Wt 161 lb (73.029 kg)  BMI 26.38 kg/m2       Objective:   Physical Exam  Vitals reviewed. Constitutional: She is oriented to person, place, and time. She appears well-developed and well-nourished. No distress.  HENT:  Head: Normocephalic and atraumatic.  Right Ear: External ear normal.  Left Ear: External ear normal.  Nose: Nose normal.  Mouth/Throat: Oropharynx is clear and moist.  Eyes: Conjunctivae normal and EOM are normal. Pupils are equal, round, and reactive to light.  Neck: Normal range of motion. Neck supple. No JVD present. No tracheal deviation present. No thyromegaly present.  Cardiovascular: Normal rate, regular rhythm and intact distal pulses.   Murmur  heard. Pulmonary/Chest: Effort normal and breath sounds normal. She has no wheezes. She exhibits no tenderness.  Abdominal: Soft. Bowel sounds are normal.  Musculoskeletal: Normal range of motion. She exhibits no edema and no tenderness.  Lymphadenopathy:    She has no cervical adenopathy.  Neurological: She is alert and oriented to person, place, and time. She has normal reflexes. No cranial nerve deficit.  Skin: Skin is warm and dry. She is not diaphoretic.  Psychiatric: She has a normal mood and affect. Her behavior is normal.          Assessment & Plan:  Stable HTN  Discussed dry mouth and allergies and will stop the benadryl and allegra and change to claritin monitor potassium and adjust dose as needed

## 2012-09-03 NOTE — Patient Instructions (Addendum)
Your dry mouth might be due to the allegra and the bendryl  Stop these  And use claritin

## 2012-09-08 DIAGNOSIS — J309 Allergic rhinitis, unspecified: Secondary | ICD-10-CM | POA: Diagnosis not present

## 2012-09-18 DIAGNOSIS — J309 Allergic rhinitis, unspecified: Secondary | ICD-10-CM | POA: Diagnosis not present

## 2012-09-26 ENCOUNTER — Other Ambulatory Visit: Payer: Self-pay | Admitting: Internal Medicine

## 2012-09-29 DIAGNOSIS — H2589 Other age-related cataract: Secondary | ICD-10-CM | POA: Diagnosis not present

## 2012-09-29 DIAGNOSIS — H52229 Regular astigmatism, unspecified eye: Secondary | ICD-10-CM | POA: Diagnosis not present

## 2012-09-29 DIAGNOSIS — H5200 Hypermetropia, unspecified eye: Secondary | ICD-10-CM | POA: Diagnosis not present

## 2012-09-29 DIAGNOSIS — H524 Presbyopia: Secondary | ICD-10-CM | POA: Diagnosis not present

## 2012-10-02 DIAGNOSIS — J309 Allergic rhinitis, unspecified: Secondary | ICD-10-CM | POA: Diagnosis not present

## 2012-10-14 ENCOUNTER — Telehealth: Payer: Self-pay | Admitting: Internal Medicine

## 2012-10-14 NOTE — Telephone Encounter (Signed)
Appointment given.

## 2012-10-14 NOTE — Telephone Encounter (Signed)
Patient Information:  Caller Name: [place]  Phone: [phone]  Patient: Pam Obrien, Pam Obrien  Gender: Female  DOB: 11/20/1946  Age: 66 Years  PCP: Darryll Capers (Adults only)   Symptoms  Reason For Call & Symptoms: Medications for allergies and blood pressure was changed on last visit.  Hair has started falling out. Has noticed hair loss for past for months, but has gotten worse in the last month.  Can visually see hair loss in crown and right side  can see scalp  Reviewed Health History In EMR: Yes  Reviewed Medications In EMR: Yes  Reviewed Allergies In EMR: Yes  Reviewed Surgeries / Procedures: Yes  Date of Onset of Symptoms: 09/11/2012  Guideline(s) Used:  No Protocol Available - Sick Adult  Disposition Per Guideline:   See Within 3 Days in Office  Reason For Disposition Reached:   Nursing judgment  Advice Given:  N/A  Office Follow Up:  Does the office need to follow up with this patient?: No  Instructions For The Office: N/A  RN Note:  Wears hair naturally with no chemical treatments.  Increase in dandruff, but oils scalp; Has bumps to come up in head that she is not aware of until it itches--when scrathed or mashed bump < 10 size will rupture with drainage or blood coming out.  Only changes she recalls in routine or product use is medication changes by provider with blood pressure and allergy medication.  Attempted to transfer to scheduling for appointment, but was backed up.  Patient states she will call back for an appointment

## 2012-10-14 NOTE — Telephone Encounter (Signed)
Pt called and spoke to CAN this morning re: rapid visual hair loss. Req call back from [place].

## 2012-10-15 DIAGNOSIS — J309 Allergic rhinitis, unspecified: Secondary | ICD-10-CM | POA: Diagnosis not present

## 2012-10-20 ENCOUNTER — Ambulatory Visit (INDEPENDENT_AMBULATORY_CARE_PROVIDER_SITE_OTHER): Payer: Medicare Other | Admitting: Family

## 2012-10-20 ENCOUNTER — Encounter: Payer: Self-pay | Admitting: Family

## 2012-10-20 VITALS — BP 130/78 | HR 74 | Temp 98.2°F | Wt 163.0 lb

## 2012-10-20 DIAGNOSIS — E119 Type 2 diabetes mellitus without complications: Secondary | ICD-10-CM | POA: Diagnosis not present

## 2012-10-20 DIAGNOSIS — L659 Nonscarring hair loss, unspecified: Secondary | ICD-10-CM | POA: Diagnosis not present

## 2012-10-20 DIAGNOSIS — L219 Seborrheic dermatitis, unspecified: Secondary | ICD-10-CM

## 2012-10-20 DIAGNOSIS — J309 Allergic rhinitis, unspecified: Secondary | ICD-10-CM | POA: Diagnosis not present

## 2012-10-20 LAB — HEMOGLOBIN A1C: Hgb A1c MFr Bld: 12.9 % — ABNORMAL HIGH (ref 4.6–6.5)

## 2012-10-20 MED ORDER — FLUOCINOLONE ACETONIDE 0.01 % EX OIL: TOPICAL_OIL | Freq: Every day | CUTANEOUS | Status: DC

## 2012-10-20 MED ORDER — METFORMIN HCL 500 MG PO TABS: 500.0000 mg | ORAL_TABLET | Freq: Two times a day (BID) | ORAL | Status: DC

## 2012-10-20 NOTE — Patient Instructions (Addendum)
Kera-Care Shampoo twice a week. Apply derma-smooth daily. Check blood sugars once in the morning before breakfast (fasting). Check 2 hours after eating. Record readings and bring to your next Office Visit.   Alopecia Areata Alopecia areata is a self-destructing (autoimmune) disease that results in the loss of hair. In this condition your body's immune system attacks the hair follicle. The hair follicle is responsible for growing hair. Hair loss can occur on the scalp and other parts of the body. It usually starts as one or more small, round, smooth patches of hair loss. It occurs in males and females of all ages and races, but usually starts before age 63. The scalp is the most commonly affected area, but the beard or any hair-bearing site can be affected. This type of hair loss does not leave scars where the hair was lost.  Many people with alopecia areata only have a few patches of hair loss. In others, extensive patchy hair loss occurs. In a few people, all scalp hair is lost (alopecia totalis), or hair is lost from the entire scalp and body (alopecia universalis). No matter how widespread the hair loss, the hair follicles remain alive and are ready to resume normal hair production whenever they receive the correct signal. Hair re-growth may occur without treatment and can even restart after years of hair loss.  CAUSES  It is thought that something triggers the immune system to stop hair growth. It is not always known what the cause is. Some people have genetic markers that can increase the chance of developing alopecia areata. Alopecia areata often occurs in families whose members have had:  Asthma.  Hay fever.  Atopic eczema.  Some autoimmune diseases may also be a trigger, such as:  Thyroid disease.  Diabetes.  Rheumatoid arthritis.  Lupus erythematosus.  Vitiligo.  Pernicious anemia.  Addison's disease. OTHER SYMPTOMS In some people, the nail beds may develop rows of tiny dents  (stippling) or the nail beds can become distorted. Other than the hair and nail beds, no other body part is affected.  PROGNOSIS  Alopecia areata is not medically disabling. People with alopecia areata are usually in excellent health. Hair loss can be emotionally difficult. The National Alopecia Areata Foundation has resources available to help individuals and families with alopecia areata. Their goal is to help people with the condition live full, productive lives. There are many successful, well-adjusted, contented people living with Alopecia areata. Alopecia areata can be overcome with:  A positive self image.  Sound medical facts.  The support of others, such as:  Sometimes professional counseling is helpful to develop one's self-confidence and positive self-image. TREATMENT  There is no cure for alopecia areata. There are several available treatments. Treatments are most effective in milder cases. No treatment is effective for everyone. Choice of treatment depends mainly on a person's age and the extent of their hair loss. Alopecia areata occurs in two forms:   A mild patchy form where less than 50 percent of scalp hair is lost.  An extensive form where greater than 50 percent of scalp hair is lost. These two forms of alopecia areata behave quite differently, and the choice of treatment depends on which form is present. Current treatments do not turn alopecia areata off. They can stimulate the hair follicle to produce hair.  Some medications used to treat mild cases include:  Cortisone injections. The most common treatment is the injection of cortisone into the bare skin patches. The injections are usually given by  a caregiver specializing in skin issues (dermatologist). This caregiver will use a tiny needle to give multiple injections into the skin in and around the bare patches. The injections are repeated once a month. If new hair growth occurs, it is usually visible within 4 weeks.  Treatment does not prevent new patches of hair loss from developing. There are few side effects from local cortisone injections. Occasionally, temporary dents (depressions) in the skin result from the local injections, but these dents can fill in by themselves.  Topical minoxidil. Five percent topical minoxidil solution applied twice daily may grow hair in alopecia areata. Scalp, eyebrows, and beard hair may respond. If scalp hair re-grows completely, treatment can be stopped. Response may improve if topical cortisone cream is applied 30 minutes after the minoxidil. Topical minoxidil is safe, easy to use, and does not lower blood pressure in persons with normal blood pressure. Minoxidil can lead to unwanted facial hair growth in some people.  Anthralin cream or ointment. Another treatment is the application of anthralin cream or ointment. Anthralin is a tar-like substance that has been used widely for psoriasis. Anthralin is applied to the bare patches once daily. It is washed off after a short time, usually 30 to 60 minutes later. If new hair growth occurs, it is seen in 8 to 12 weeks. Anthralin can be irritating to the skin. It can cause temporary, brownish discoloration of the treated skin. By using short treatment times, skin irritation and skin staining are reduced without decreasing effectiveness. Care must be taken not to get anthralin in the eyes. Some of the medications used for more extensive cases where there is greater than 50% hair loss include:  Cortisone pills. Cortisone pills are sometimes given for extensive scalp hair loss. Cortisone taken internally is much stronger than local injections of cortisone into the skin. It is necessary to discuss possible side effects of cortisone pills with your caregiver. In general, however, cortisone pills are used in relatively few patients with alopecia areata due to health risks from prolonged use. Also, hair that has grown is likely to fall out when the  cortisone pills are stopped.  Topical minoxidil. See previous explanation under mild, patchy alopecia areata. However, minoxidil is not effective in total loss of scalp hair (alopecia totalis).  Topical immunotherapy. Another method of treating alopecia areata or alopecia totalis/universalis involves producing an allergic rash or allergic contact dermatitis. Chemicals such as diphencyprone (DPCP) or squaric acid dibutyl ester (SADBE) are applied to the scalp to produce an allergic rash which resembles poison oak or ivy. Approximately 40% of patients treated with topical immunotherapy will re-grow scalp hair after about 6 months of treatment. Those who do successfully re-grow scalp hair will need to continue treatment to maintain hair re-growth.  Wigs. For extensive hair loss, a wig can be an important option for some people. Proper attention will make a quality wig look completely natural. A wig will need to be cut, thinned, and styled. To keep a net base wig from falling off, special double-sided tape can be purchased in beauty supply outlets and fastened to the inside of the wig.  For those with completely bare heads, there are suction caps to which any wig can be attached. There are also entire suction cap wig units. FOR MORE INFORMATION National Alopecia Areata Foundation: [email or web address] Document Released: 06/01/2004 Document Revised: 01/20/2012 Document Reviewed: 01/03/2009 [facility] Patient Information 2013 [place], [place].    Seborrheic Dermatitis Seborrheic dermatitis involves pink or red skin with greasy, flaky  scales. This is often found on the scalp, eyebrows, nose, bearded area, and on or behind the ears. It can also occur on the central chest. It often occurs where there are more oil (sebaceous) glands. This condition is also known as dandruff. When this condition affects a baby's scalp, it is called cradle cap. It may come and go for no known reason. It can occur at any time of life from  infancy to old age. CAUSES  The cause is unknown. It is not the result of too little moisture or too much oil. In some people, seborrheic dermatitis flare-ups seem to be triggered by stress. It also commonly occurs in people with certain diseases such as Parkinson's disease or HIV/AIDS. SYMPTOMS   Thick scales on the scalp.  Redness on the face or in the armpits.  The skin may seem oily or dry, but moisturizers do not help.  In infants, seborrheic dermatitis appears as scaly redness that does not seem to bother the baby. In some babies, it affects only the scalp. In others, it also affects the neck creases, armpits, groin, or behind the ears.  In adults and adolescents, seborrheic dermatitis may affect only the scalp. It may look patchy or spread out, with areas of redness and flaking. Other areas commonly affected include:  Eyebrows.  Eyelids.  Forehead.  Skin behind the ears.  Outer ears.  Chest.  Armpits.  Nose creases.  Skin creases under the breasts.  Skin between the buttocks.  Groin.  Some adults and adolescents feel itching or burning in the affected areas. DIAGNOSIS  Your caregiver can usually tell what the problem is by doing a physical exam. TREATMENT   Cortisone (steroid) ointments, creams, and lotions can help decrease inflammation.  Babies can be treated with baby oil to soften the scales, then they may be washed with baby shampoo. If this does not help, a prescription topical steroid medicine may work.  Adults can use medicated shampoos.  Your caregiver may prescribe corticosteroid cream and shampoo containing an antifungal or yeast medicine (ketoconazole). Hydrocortisone or anti-yeast cream can be rubbed directly onto seborrheic dermatitis patches. Yeast does not cause seborrheic dermatitis, but it seems to add to the problem. In infants, seborrheic dermatitis is often worst during the first year of life. It tends to disappear on its own as the child  grows. However, it may return during the teenage years. In adults and adolescents, seborrheic dermatitis tends to be a long-lasting condition that comes and goes over many years. HOME CARE INSTRUCTIONS   Use prescribed medicines as directed.  In infants, do not aggressively remove the scales or flakes on the scalp with a comb or by other means. This may lead to hair loss. SEEK MEDICAL CARE IF:   The problem does not improve from the medicated shampoos, lotions, or other medicines given by your caregiver.  You have any other questions or concerns. Document Released: 10/28/2005 Document Revised: 04/28/2012 Document Reviewed: 03/19/2010 [facility] Patient Information 2013 [place], [place].   Diabetes, Type 2 Diabetes is a long-lasting (chronic) disease. In type 2 diabetes, the pancreas does not make enough insulin (a hormone), and the body does not respond normally to the insulin that is made. This type of diabetes was also previously called adult-onset diabetes. It usually occurs after the age of 67, but it can occur at any age.  CAUSES  Type 2 diabetes happens because the pancreasis not making enough insulin or your body has trouble using the insulin that your  pancreas does make properly. SYMPTOMS   Drinking more than usual.  Urinating more than usual.  Blurred vision.  Dry, itchy skin.  Frequent infections.  Feeling more tired than usual (fatigue). DIAGNOSIS The diagnosis of type 2 diabetes is usually made by one of the following tests:  Fasting blood glucose test. You will not eat for at least 8 hours and then take a blood test.  Random blood glucose test. Your blood glucose (sugar) is checked at any time of the day regardless of when you ate.  Oral glucose tolerance test (OGTT). Your blood glucose is measured after you have not eaten (fasted) and then after you drink a glucose containing beverage. TREATMENT   Healthy eating.  Exercise.  Medicine, if needed.  Monitoring  blood glucose.  Seeing your caregiver regularly. HOME CARE INSTRUCTIONS   Check your blood glucose at least once a day. More frequent monitoring may be necessary, depending on your medicines and on how well your diabetes is controlled. Your caregiver will advise you.  Take your medicine as directed by your caregiver.  Do not smoke.  Make wise food choices. Ask your caregiver for information. Weight loss can improve your diabetes.  Learn about low blood glucose (hypoglycemia) and how to treat it.  Get your eyes checked regularly.  Have a yearly physical exam. Have your blood pressure checked and your blood and urine tested.  Wear a pendant or bracelet saying that you have diabetes.  Check your feet every night for cuts, sores, blisters, and redness. Let your caregiver know if you have any problems. SEEK MEDICAL CARE IF:   You have problems keeping your blood glucose in target range.  You have problems with your medicines.  You have symptoms of an illness that do not improve after 24 hours.  You have a sore or wound that is not healing.  You notice a change in vision or a new problem with your vision.  You have a fever. MAKE SURE YOU:  Understand these instructions.  Will watch your condition.  Will get help right away if you are not doing well or get worse. Document Released: 10/28/2005 Document Revised: 01/20/2012 Document Reviewed: 04/15/2011 [facility] Patient Information 2013 [place], [place].  Diabetes Meal Planning Guide The diabetes meal planning guide is a tool to help you plan your meals and snacks. It is important for people with diabetes to manage their blood glucose (sugar) levels. Choosing the right foods and the right amounts throughout your day will help control your blood glucose. Eating right can even help you improve your blood pressure and reach or maintain a healthy weight. CARBOHYDRATE COUNTING MADE EASY When you eat carbohydrates, they turn to sugar.  This raises your blood glucose level. Counting carbohydrates can help you control this level so you feel better. When you plan your meals by counting carbohydrates, you can have more flexibility in what you eat and balance your medicine with your food intake. Carbohydrate counting simply means adding up the total amount of carbohydrate grams in your meals and snacks. Try to eat about the same amount at each meal. Foods with carbohydrates are listed below. Each portion below is 1 carbohydrate serving or 15 grams of carbohydrates. Ask your dietician how many grams of carbohydrates you should eat at each meal or snack. Grains and Starches  1 slice bread.   English muffin or hotdog/hamburger bun.   cup cold cereal (unsweetened).   cup cooked pasta or rice.   cup starchy vegetables (corn, potatoes,  peas, beans, winter squash).  1 tortilla (6 inches).   bagel.  1 waffle or pancake (size of a CD).   cup cooked cereal.  4 to 6 small crackers. *Whole grain is recommended. Fruit  1 cup fresh unsweetened berries, melon, papaya, pineapple.  1 small fresh fruit.   banana or mango.   cup fruit juice (4 oz unsweetened).   cup canned fruit in natural juice or water.  2 tbs dried fruit.  12 to 15 grapes or cherries. Milk and Yogurt  1 cup fat-free or 1% milk.  1 cup soy milk.  6 oz light yogurt with sugar-free sweetener.  6 oz low-fat soy yogurt.  6 oz plain yogurt. Vegetables  1 cup raw or  cup cooked is counted as 0 carbohydrates or a "free" food.  If you eat 3 or more servings at 1 meal, count them as 1 carbohydrate serving. Other Carbohydrates   oz chips or pretzels.   cup ice cream or frozen yogurt.   cup sherbet or sorbet.  2 inch square cake, no frosting.  1 tbs honey, sugar, jam, jelly, or syrup.  2 small cookies.  3 squares of graham crackers.  3 cups popcorn.  6 crackers.  1 cup broth-based soup.  Count 1 cup casserole or other mixed  foods as 2 carbohydrate servings.  Foods with less than 20 calories in a serving may be counted as 0 carbohydrates or a "free" food. You may want to purchase a book or computer software that lists the carbohydrate gram counts of different foods. In addition, the nutrition facts panel on the labels of the foods you eat are a good source of this information. The label will tell you how big the serving size is and the total number of carbohydrate grams you will be eating per serving. Divide this number by 15 to obtain the number of carbohydrate servings in a portion. Remember, 1 carbohydrate serving equals 15 grams of carbohydrate. SERVING SIZES Measuring foods and serving sizes helps you make sure you are getting the right amount of food. The list below tells how big or small some common serving sizes are.  1 oz.........4 stacked dice.  3 oz........Marland KitchenDeck of cards.  1 tsp.......Marland KitchenTip of little finger.  1 tbs......Marland KitchenMarland KitchenThumb.  2 tbs.......Marland KitchenGolf ball.   cup......Marland KitchenHalf of a fist.  1 cup.......Marland KitchenA fist. SAMPLE DIABETES MEAL PLAN Below is a sample meal plan that includes foods from the grain and starches, dairy, vegetable, fruit, and meat groups. A dietician can individualize a meal plan to fit your calorie needs and tell you the number of servings needed from each food group. However, controlling the total amount of carbohydrates in your meal or snack is more important than making sure you include all of the food groups at every meal. You may interchange carbohydrate containing foods (dairy, starches, and fruits). The meal plan below is an example of a 2000 calorie diet using carbohydrate counting. This meal plan has 17 carbohydrate servings. Breakfast  1 cup oatmeal (2 carb servings).   cup light yogurt (1 carb serving).  1 cup blueberries (1 carb serving).   cup almonds. Snack  1 large apple (2 carb servings).  1 low-fat string cheese stick. Lunch  Chicken breast salad.  1 cup  spinach.   cup chopped tomatoes.  2 oz chicken breast, sliced.  2 tbs low-fat [place] dressing.  12 whole-wheat crackers (2 carb servings).  12 to 15 grapes (1 carb serving).  1 cup low-fat milk (1 carb  serving). Snack  1 cup carrots.   cup hummus (1 carb serving). Dinner  3 oz broiled salmon.  1 cup brown rice (3 carb servings). Snack  1  cups steamed broccoli (1 carb serving) drizzled with 1 tsp olive oil and lemon juice.  1 cup light pudding (2 carb servings). DIABETES MEAL PLANNING WORKSHEET Your dietician can use this worksheet to help you decide how many servings of foods and what types of foods are right for you.  BREAKFAST Food Group and Servings / Carb Servings Grain/Starches __________________________________ Dairy __________________________________________ Vegetable ______________________________________ Fruit ___________________________________________ Meat __________________________________________ Fat ____________________________________________ LUNCH Food Group and Servings / Carb Servings Grain/Starches ___________________________________ Dairy ___________________________________________ Fruit ____________________________________________ Meat ___________________________________________ Fat _____________________________________________ Laural Golden Food Group and Servings / Carb Servings Grain/Starches ___________________________________ Dairy ___________________________________________ Fruit ____________________________________________ Meat ___________________________________________ Fat _____________________________________________ SNACKS Food Group and Servings / Carb Servings Grain/Starches ___________________________________ Dairy ___________________________________________ Vegetable _______________________________________ Fruit ____________________________________________ Meat ___________________________________________ Fat  _____________________________________________ DAILY TOTALS Starches _________________________ Vegetable ________________________ Fruit ____________________________ Dairy ____________________________ Meat ____________________________ Fat ______________________________ Document Released: 07/25/2005 Document Revised: 01/20/2012 Document Reviewed: 06/05/2009 [employer] Patient Information 2013 [place], [place].

## 2012-10-20 NOTE — Progress Notes (Signed)
Subjective:    Patient ID: Pam Obrien, female    DOB: 09/06/1946, 66 y.o.   MRN: [id number]  HPI 66 year old African American female, nonsmoker, patient of Dr. Lovell Sheehan is in today with complaints of thinning of her hair x2-3 months, and worsening. She's been using tea tree oil on her scalp that hasn't helped. She currently washes her hair about once every 2-3 weeks. She's been sitting more dandruff and scalp is more itchy than usual. Patient also admits to a dry mouth.  At her last office visit, her blood sugar was discovered to be 360. She has a family history of type 2 diabetes. She's not currently exercising. Has noticed about a 20 pound weight loss this year. Denies any blurred vision, dry mouth, urinary frequency. However, she reports eating sore throat lozenges on a daily basis.    Review of Systems  Constitutional: Positive for unexpected weight change. Negative for appetite change and fatigue.  HENT: Negative.   Eyes: Negative.  Negative for visual disturbance.  Respiratory: Negative.   Cardiovascular: Negative.   Gastrointestinal: Negative.   Genitourinary: Negative.   Musculoskeletal: Negative.   Skin: Negative.        Hair is thinning and dry  Neurological: Negative.   Hematological: Negative.   Psychiatric/Behavioral: Negative.    Past Medical History  Diagnosis Date  . Allergy   . Hyperlipidemia   . Arthritis     History   Social History  . Marital Status: Married    Spouse Name: N/A    Number of Children: N/A  . Years of Education: N/A   Occupational History  . Not on file.   Social History Main Topics  . Smoking status: Never Smoker   . Smokeless tobacco: Not on file  . Alcohol Use: No  . Drug Use: No  . Sexually Active: Not on file   Other Topics Concern  . Not on file   Social History Narrative  . No narrative on file    Past Surgical History  Procedure Date  . Colonoscopy 2008  . Flexible sigmoidoscopy 2003  . Abdominal  hysterectomy     Family History  Problem Relation Age of Onset  . Hypertension    . Liver cancer    . Stroke      Allergies  Allergen Reactions  . Aspirin     REACTION: nervous  . Celecoxib     REACTION: joints swell  . Nsaids     REACTION: sweling  . Sulfamethoxazole     REACTION: urticaria (hives)    Current Outpatient Prescriptions on File Prior to Visit  Medication Sig Dispense Refill  . atenolol-chlorthalidone (TENORETIC) 50-25 MG per tablet TAKE 1 TABLET BY MOUTH EVERY DAY  30 tablet  6  . calcium citrate-vitamin D (CITRACAL+D) 315-200 MG-UNIT per tablet Take 1 tablet by mouth daily.      . cholecalciferol (VITAMIN D) 1000 UNITS tablet Take 2,000 Units by mouth daily.      Marland Kitchen FeFum-FePo-FA-B Cmp-C-Zn-Mn-Cu (SE-TAN PLUS) 162-115.2-1 MG CAPS TAKE 162 MG BY MOUTH DAILY.  30 capsule  9  . KLOR-CON M20 20 MEQ tablet TAKE 1 TABLET EVERY DAY  30 tablet  4  . loratadine (CLARITIN) 10 MG tablet Take 1 tablet (10 mg total) by mouth daily.  30 tablet  11  . potassium chloride SA (KLOR-CON M20) 20 MEQ tablet       . metFORMIN (GLUCOPHAGE) 500 MG tablet Take 1 tablet (500 mg total) by mouth 2 (two)  times daily with a meal.  60 tablet  3    BP 130/78  Pulse 74  Temp 98.2 F (36.8 C) (Oral)  Wt 163 lb (73.936 kg)  SpO2 94%chart    Objective:   Physical Exam  Constitutional: She is oriented to person, place, and time. She appears well-developed and well-nourished.  HENT:  Right Ear: External ear normal.  Left Ear: External ear normal.  Mouth/Throat: Oropharynx is clear and moist.  Neck: Normal range of motion. Neck supple.  Cardiovascular: Normal rate, regular rhythm and normal heart sounds.   Pulmonary/Chest: Effort normal and breath sounds normal.  Abdominal: Soft. Bowel sounds are normal.  Musculoskeletal: Normal range of motion.  Neurological: She is alert and oriented to person, place, and time.  Skin: Skin is warm and dry.       Dry scalp. Hair thinning around the  periphery and crown. Mild dandruff.  Psychiatric: She has a normal mood and affect.          Assessment & Plan:  Assessment: Type 2 diabetes, seborrheic dermatitis, hair thinning  Plan: For her scalp, advised to shampoo her hair twice weekly with Kera-Care shampoo and conditioner. Will start Derma-Smoothe applied to the scalp nightly. We'll start metformin 500 mg twice a day. Advise of the possibility of GI upset that she usually transient. Check blood sugars fasting and postprandially daily. Patient will keep a blood sugar diary and return in 2 weeks with results. Glucometer provided today. Information on diabetes ML planning provided for patient today. She will return fasting and we'll check her cholesterol. We'll also consider starting an ACE inhibitor and

## 2012-10-21 ENCOUNTER — Ambulatory Visit: Payer: Medicare Other | Admitting: Family

## 2012-10-21 ENCOUNTER — Telehealth: Payer: Self-pay | Admitting: Internal Medicine

## 2012-10-21 MED ORDER — KETOCONAZOLE 2 % EX SHAM: MEDICATED_SHAMPOO | CUTANEOUS | Status: DC

## 2012-10-21 NOTE — Telephone Encounter (Signed)
Pt aware.

## 2012-10-21 NOTE — Telephone Encounter (Signed)
Do not pick up Kera-Care. Try Ketoconozole shampoo that I have sent to the pharmacy twice a week. Use tea tree oil on the scalp daily.

## 2012-10-21 NOTE — Telephone Encounter (Signed)
Sending to you regarding script from [facility]

## 2012-10-21 NOTE — Telephone Encounter (Signed)
Patient Information:  Caller Name: Margaretha  Phone: [phone]  Patient: Pam Obrien, Pam Obrien  Gender: Female  DOB: Sep 29, 1946  Age: 66 Years  PCP: Adline Mango [facility])  Office Follow Up:  Does the office need to follow up with this patient?: Yes  Instructions For The Office: Patient needs replacement med for Fluocinolone.   Med. label says to contact provider if allergice to [facility]. Patient reports she is allergic to Peanuts.  RN Note:  Will send note regarding script for Fluocinolone to Ms. Campbell,PA.  Symptoms  Reason For Call & Symptoms: Caller seen in the office 10/20/12 by Ms. Orvan Falconer ,PA for hair loss. Patient states she was prescribed Fluocinolone and had filled at pharmacy . LABEL ON PRESCRIPTION STATES IF PATIENT IS ALLERGIC TO PEANUTS TO NOTIFY PROVIDER.  PATIENT REPORTS SHE  IS ALLERGIC TO PEANUTS AND IS REQUESTING ANOTHER MEDICATION TREATMENT OPTION.   Pharmacy - [facility]- ph# [phone].  Reviewed Health History In EMR: N/A  Reviewed Medications In EMR: N/A  Reviewed Allergies In EMR: N/A  Reviewed Surgeries / Procedures: N/A  Date of Onset of Symptoms: Unknown  Guideline(s) Used:  No Protocol Available - Information Only  Disposition Per Guideline:   Discuss with PCP and Callback by Nurse Today  Reason For Disposition Reached:   Nursing judgment  Advice Given:  N/A

## 2012-10-28 DIAGNOSIS — J309 Allergic rhinitis, unspecified: Secondary | ICD-10-CM | POA: Diagnosis not present

## 2012-10-30 ENCOUNTER — Telehealth: Payer: Self-pay | Admitting: Internal Medicine

## 2012-10-30 DIAGNOSIS — J309 Allergic rhinitis, unspecified: Secondary | ICD-10-CM | POA: Diagnosis not present

## 2012-10-30 NOTE — Telephone Encounter (Signed)
Seen Pam Obrien and started on Metformin.  Pam Obrien advised may cause stomach upset.

## 2012-10-30 NOTE — Telephone Encounter (Signed)
Patient Information:  Caller Name: Aleaya  Phone: [phone]  Patient: Pam Obrien, Pam Obrien  Gender: Female  DOB: 30-Jun-1946  Age: 66 Years  PCP: Adline Mango [facility])  Office Follow Up:  Does the office need to follow up with this patient?: No  Instructions For The Office: N/A  RN Note:  Last FBS 10/29/12 at 178. Ran out of test supplies.  Started on Metformin BID since 10/20/12. Mild periumblilical abdominal pain interfers with activities. Mild low back pain present. Abdominal pain is present for > hours. No office appointments for next 3.75 hrs.  Advised to go to [facility] ED now.  Symptoms  Reason For Call & Symptoms: Called regarding constipation.  Reports began with  loose stools for 3 days after started Metformin and now is uncomfortable with stool every 2-3 days.  Last BM 10/26/12 or 10/27/12.  Also reports L eye redness 12/20 with bilateral watering eye since  since started Claritin.  Reviewed Health History In EMR: Yes  Reviewed Medications In EMR: Yes  Reviewed Allergies In EMR: Yes  Reviewed Surgeries / Procedures: Yes  Date of Onset of Symptoms: 10/26/2012  Guideline(s) Used:  Constipation  Abdominal Pain - Female  Disposition Per Guideline:   Go to ED Now (or to Office with PCP Approval)  Reason For Disposition Reached:   Constant abdominal pain lasting > 2 hours  Advice Given:  N/A

## 2012-11-09 ENCOUNTER — Ambulatory Visit (INDEPENDENT_AMBULATORY_CARE_PROVIDER_SITE_OTHER): Payer: Medicare Other | Admitting: Family

## 2012-11-09 ENCOUNTER — Encounter: Payer: Self-pay | Admitting: Family

## 2012-11-09 VITALS — BP 132/76 | Temp 97.8°F | Wt 163.0 lb

## 2012-11-09 DIAGNOSIS — J309 Allergic rhinitis, unspecified: Secondary | ICD-10-CM

## 2012-11-09 DIAGNOSIS — L219 Seborrheic dermatitis, unspecified: Secondary | ICD-10-CM

## 2012-11-09 DIAGNOSIS — E119 Type 2 diabetes mellitus without complications: Secondary | ICD-10-CM

## 2012-11-09 MED ORDER — FLUTICASONE PROPIONATE 50 MCG/ACT NA SUSP: 2.0000 | Freq: Every day | NASAL | Status: DC

## 2012-11-09 NOTE — Progress Notes (Signed)
Subjective:    Patient ID: Pam Obrien, female    DOB: 1946-10-29, 66 y.o.   MRN: [id number]  HPI 66 year old African American female, nonsmoker, patient of Dr. Lovell Sheehan is in for recheck of seborrheic dermatitis and type 2 diabetes. At her last office visit she was found to be diabetic and has been taken metformin 500 mg twice a day. Checks her blood sugars twice a day fasting readings are usually 160-180. She has tried to alter her diet to accommodate her new diagnosis. She continues to be highly allergic to various foods, and therefore has been struggling with her diet. Patient has been using Nizoral shampoo her scalp twice a week and is beginning to see her hair taken in her scalp improve.   Has concerns of nasal congestion and sneezing. Has a long-standing history of allergic rhinitis with multiple allergies. Has taken Claritin, Zyrtec, Allegra in the past. Claritin does not appear to be strong enough. Allegra causes dry mouth. The Zyrtec does not work well anymore. In the past he's used nasal sprays but does not like them.   Review of Systems  Constitutional: Negative.   HENT: Positive for congestion, sneezing and postnasal drip.   Eyes: Negative.   Respiratory: Negative.   Cardiovascular: Negative.   Musculoskeletal: Negative.   Skin: Negative.   Neurological: Negative.   Hematological: Negative.   Psychiatric/Behavioral: Negative.    Past Medical History  Diagnosis Date  . Allergy   . Hyperlipidemia   . Arthritis     History   Social History  . Marital Status: Married    Spouse Name: N/A    Number of Children: N/A  . Years of Education: N/A   Occupational History  . Not on file.   Social History Main Topics  . Smoking status: Never Smoker   . Smokeless tobacco: Not on file  . Alcohol Use: No  . Drug Use: No  . Sexually Active: Not on file   Other Topics Concern  . Not on file   Social History Narrative  . No narrative on file    Past Surgical  History  Procedure Date  . Colonoscopy 2008  . Flexible sigmoidoscopy 2003  . Abdominal hysterectomy     Family History  Problem Relation Age of Onset  . Hypertension    . Liver cancer    . Stroke      Allergies  Allergen Reactions  . Aspirin     REACTION: nervous  . Celecoxib     REACTION: joints swell  . Nsaids     REACTION: sweling  . Peanuts (Peanut Oil)   . Sulfamethoxazole     REACTION: urticaria (hives)    Current Outpatient Prescriptions on File Prior to Visit  Medication Sig Dispense Refill  . atenolol-chlorthalidone (TENORETIC) 50-25 MG per tablet TAKE 1 TABLET BY MOUTH EVERY DAY  30 tablet  6  . calcium citrate-vitamin D (CITRACAL+D) 315-200 MG-UNIT per tablet Take 1 tablet by mouth daily.      . cholecalciferol (VITAMIN D) 1000 UNITS tablet Take 2,000 Units by mouth daily.      Marland Kitchen FeFum-FePo-FA-B Cmp-C-Zn-Mn-Cu (SE-TAN PLUS) 162-115.2-1 MG CAPS TAKE 162 MG BY MOUTH DAILY.  30 capsule  9  . ketoconazole (NIZORAL) 2 % shampoo Apply topically 2 (two) times a week.  120 mL  2  . KLOR-CON M20 20 MEQ tablet TAKE 1 TABLET EVERY DAY  30 tablet  4  . loratadine (CLARITIN) 10 MG tablet Take 1 tablet (10 mg  total) by mouth daily.  30 tablet  11  . metFORMIN (GLUCOPHAGE) 500 MG tablet Take 1 tablet (500 mg total) by mouth 2 (two) times daily with a meal.  60 tablet  3  . potassium chloride SA (KLOR-CON M20) 20 MEQ tablet       . fluticasone (FLONASE) 50 MCG/ACT nasal spray Place 2 sprays into the nose daily.  16 g  6    BP 132/76  Temp 97.8 F (36.6 C) (Oral)  Wt 163 lb (73.936 kg)chart    Objective:   Physical Exam  Constitutional: She is oriented to person, place, and time. She appears well-developed and well-nourished.  HENT:  Right Ear: External ear normal.  Left Ear: External ear normal.       Bilateral nasal turbinates swelling  Neck: Normal range of motion. Neck supple.  Cardiovascular: Normal rate, regular rhythm and normal heart sounds.     Pulmonary/Chest: Effort normal and breath sounds normal.  Neurological: She is alert and oriented to person, place, and time.  Skin: Skin is warm and dry.  Psychiatric: She has a normal mood and affect.          Assessment & Plan:  Assessment: Type 2 diabetes-uncontrolled, seborrheic dermatitis, Allergic Rhinitis   Plan: Continue Nizoral shampoo twice a week. Rogaine for Women one month for now apply twice a day to the scalp. See Dr. Lovell Sheehan in February for fasting blood work and possible medication adjustment. Continue to check blood sugars as discussed. Healthy diet and exercise. Recheck as needed sooner. Advise diabetic eye exam and to notify her ophthalmologist that she is diabetic. Flonase 2 sprays in each nostril once a day. Consider Xyzal if no better.

## 2012-11-09 NOTE — Patient Instructions (Addendum)

## 2012-12-03 ENCOUNTER — Ambulatory Visit: Payer: Medicare Other | Admitting: Family

## 2012-12-04 ENCOUNTER — Ambulatory Visit: Payer: Medicare Other | Admitting: Family

## 2012-12-18 DIAGNOSIS — J309 Allergic rhinitis, unspecified: Secondary | ICD-10-CM | POA: Diagnosis not present

## 2012-12-30 DIAGNOSIS — J309 Allergic rhinitis, unspecified: Secondary | ICD-10-CM | POA: Diagnosis not present

## 2013-01-01 DIAGNOSIS — J309 Allergic rhinitis, unspecified: Secondary | ICD-10-CM | POA: Diagnosis not present

## 2013-01-04 ENCOUNTER — Ambulatory Visit (INDEPENDENT_AMBULATORY_CARE_PROVIDER_SITE_OTHER): Payer: Medicare Other | Admitting: Internal Medicine

## 2013-01-04 ENCOUNTER — Encounter: Payer: Self-pay | Admitting: Internal Medicine

## 2013-01-04 VITALS — BP 140/84 | HR 76 | Temp 98.2°F | Resp 16 | Ht 65.5 in | Wt 164.0 lb

## 2013-01-04 DIAGNOSIS — IMO0001 Reserved for inherently not codable concepts without codable children: Secondary | ICD-10-CM

## 2013-01-04 DIAGNOSIS — J309 Allergic rhinitis, unspecified: Secondary | ICD-10-CM | POA: Diagnosis not present

## 2013-01-04 DIAGNOSIS — E1169 Type 2 diabetes mellitus with other specified complication: Secondary | ICD-10-CM | POA: Diagnosis not present

## 2013-01-04 DIAGNOSIS — L719 Rosacea, unspecified: Secondary | ICD-10-CM | POA: Diagnosis not present

## 2013-01-04 DIAGNOSIS — E1165 Type 2 diabetes mellitus with hyperglycemia: Secondary | ICD-10-CM | POA: Insufficient documentation

## 2013-01-04 LAB — HEMOGLOBIN A1C: Hgb A1c MFr Bld: 9.6 % — ABNORMAL HIGH (ref 4.6–6.5)

## 2013-01-04 MED ORDER — LINAGLIPTIN-METFORMIN HCL 2.5-500 MG PO TABS: 1.0000 | ORAL_TABLET | Freq: Every day | ORAL | Status: DC

## 2013-01-04 MED ORDER — DOXYCYCLINE HYCLATE 50 MG PO CAPS: 50.0000 mg | ORAL_CAPSULE | Freq: Two times a day (BID) | ORAL | Status: DC

## 2013-01-04 NOTE — Progress Notes (Signed)
Subjective:    Patient ID: Pam Obrien, female    DOB: 1946/08/15, 67 y.o.   MRN: [id number]  HPI The patient is newly diagnosed with adult-onset diabetes. Hemoglobin A1c drawn in December was 10. On metformin her blood sugars and initially responded when the dose was doubled she had hypoglycemia and a sensation of altered mental status.  A hemoglobin A1c should be redrawn today as well as a basic metabolic panel to look at where her blood sugars are  And a medicine change is warranted   Review of Systems  Constitutional: Negative for activity change, appetite change and fatigue.  HENT: Positive for postnasal drip. Negative for ear pain, congestion, neck pain and sinus pressure.   Eyes: Negative for redness and visual disturbance.  Respiratory: Negative for cough, shortness of breath and wheezing.   Gastrointestinal: Negative for abdominal pain and abdominal distention.  Genitourinary: Positive for frequency. Negative for dysuria and menstrual problem.  Musculoskeletal: Negative for myalgias, joint swelling and arthralgias.  Skin: Negative for rash and wound.  Neurological: Negative for dizziness, weakness and headaches.  Hematological: Negative for adenopathy. Does not bruise/bleed easily.  Psychiatric/Behavioral: Positive for confusion. Negative for sleep disturbance and decreased concentration.   Past Medical History  Diagnosis Date  . Allergy   . Hyperlipidemia   . Arthritis     History   Social History  . Marital Status: Married    Spouse Name: N/A    Number of Children: N/A  . Years of Education: N/A   Occupational History  . Not on file.   Social History Main Topics  . Smoking status: Never Smoker   . Smokeless tobacco: Not on file  . Alcohol Use: No  . Drug Use: No  . Sexually Active: Not on file   Other Topics Concern  . Not on file   Social History Narrative  . No narrative on file    Past Surgical History  Procedure Laterality Date  .  Colonoscopy  2008  . Flexible sigmoidoscopy  2003  . Abdominal hysterectomy      Family History  Problem Relation Age of Onset  . Hypertension    . Liver cancer    . Stroke      Allergies  Allergen Reactions  . Aspirin     REACTION: nervous  . Celecoxib     REACTION: joints swell  . Nsaids     REACTION: sweling  . Peanuts (Peanut Oil)   . Sulfamethoxazole     REACTION: urticaria (hives)    Current Outpatient Prescriptions on File Prior to Visit  Medication Sig Dispense Refill  . atenolol-chlorthalidone (TENORETIC) 50-25 MG per tablet TAKE 1 TABLET BY MOUTH EVERY DAY  30 tablet  6  . calcium citrate-vitamin D (CITRACAL+D) 315-200 MG-UNIT per tablet Take 1 tablet by mouth daily.      . cholecalciferol (VITAMIN D) 1000 UNITS tablet Take 2,000 Units by mouth daily.      Marland Kitchen FeFum-FePo-FA-B Cmp-C-Zn-Mn-Cu (SE-TAN PLUS) 162-115.2-1 MG CAPS TAKE 162 MG BY MOUTH DAILY.  30 capsule  9  . ketoconazole (NIZORAL) 2 % shampoo Apply topically 2 (two) times a week.  120 mL  2  . KLOR-CON M20 20 MEQ tablet TAKE 1 TABLET EVERY DAY  30 tablet  4  . loratadine (CLARITIN) 10 MG tablet Take 1 tablet (10 mg total) by mouth daily.  30 tablet  11   No current facility-administered medications on file prior to visit.    BP 140/84  Pulse 76  Temp(Src) 98.2 F (36.8 C)  Resp 16  Ht 5' 5.5" (1.664 m)  Wt 164 lb (74.39 kg)  BMI 26.87 kg/m2       Objective:   Physical Exam  Constitutional: She appears well-developed and well-nourished. No distress.  HENT:  Head: Normocephalic and atraumatic.  Right Ear: External ear normal.  Left Ear: External ear normal.  Nose: Nose normal.  Mouth/Throat: Oropharynx is clear and moist.  Eyes: Conjunctivae and EOM are normal. Pupils are equal, round, and reactive to light.  Neck: Normal range of motion. Neck supple. No JVD present. No tracheal deviation present. No thyromegaly present.  Cardiovascular: Normal rate, regular rhythm, normal heart sounds and  intact distal pulses.   No murmur heard. Pulmonary/Chest: Effort normal and breath sounds normal. She has no wheezes. She exhibits no tenderness.  Abdominal: Soft. Bowel sounds are normal.  Musculoskeletal: Normal range of motion. She exhibits no edema and no tenderness.  Lymphadenopathy:    She has no cervical adenopathy.  Neurological: She has normal reflexes. No cranial nerve deficit.  Skin: She is not diaphoretic.          Assessment & Plan:  Seborrhea capitis has not responded to Nizoral shampoo Adult-onset diabetes with unknown hemoglobin A1c last hemoglobin A1c was 10 Been on metformin but has had some hypoglycemia due to buildup of the metformin Change in medications warranted, better response possible to the newer agents  Refer to DM teaching and nutrition for diet change Change medication to jentadueto 5/500 monitor cbgs

## 2013-01-04 NOTE — Patient Instructions (Signed)
The patient is instructed to continue all medications as prescribed. Schedule followup with check out clerk upon leaving the clinic  

## 2013-01-21 ENCOUNTER — Encounter: Payer: Medicare Other | Attending: Internal Medicine | Admitting: *Deleted

## 2013-01-21 ENCOUNTER — Encounter: Payer: Self-pay | Admitting: *Deleted

## 2013-01-21 VITALS — Ht 65.0 in | Wt 168.3 lb

## 2013-01-21 DIAGNOSIS — Z713 Dietary counseling and surveillance: Secondary | ICD-10-CM | POA: Diagnosis not present

## 2013-01-21 DIAGNOSIS — E119 Type 2 diabetes mellitus without complications: Secondary | ICD-10-CM | POA: Diagnosis not present

## 2013-01-21 DIAGNOSIS — IMO0001 Reserved for inherently not codable concepts without codable children: Secondary | ICD-10-CM

## 2013-01-21 NOTE — Progress Notes (Signed)
  Medical Nutrition Therapy:  Appt start time: 1500 end time:  1600.  Assessment:  Primary concerns today: patient here for diabetes education. Lives with daughter and grandkids. She shops and prepares her own meals. 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 Spends most of her time at home for now and has no set schedule for meals or any activities. Does not SMBG yet stating she ran out of the sample strips and has not gotten a Rx yet from MD to get more.   MEDICATIONS: see list   DIETARY INTAKE:  Usual eating pattern includes 2-3 meals and 1-2 snacks per day.  Everyday foods include limited variety of food groups.  Avoided foods include allergic to milk, beef, wheat bread, seafood, shellfish, beans, peanut butter.    24-hr recall:  B ( AM): skips usually  Snk ( AM): occasionally a piece of fruit   L ( PM): varies quite a bit, may eat breakfast foods or any other combination. Water and juice Snk ( PM): same as AM or  D ( PM): depends on her appetite, has many food allergies, Snk ( PM): varies Beverages: water, fruit juice  Usual physical activity: used to walk on local trails, may exercise around the house some  Estimated energy needs: 1400 calories 158 g carbohydrates 105 g protein 39 g fat  Progress Towards Goal(s):  In progress.   Nutritional Diagnosis:  NB-1.1 Food and nutrition-related knowledge deficit As related to new diagnosis of diabetes.  As evidenced by A1c of 9.6% on 01/04/13 improved from 12.9% on 10/20/12.    Intervention:  Nutrition counseling and diabetes education intiated. Discussed basic physiology of diabetes, SMBG and rationale of checking BG at alternate times of day, A1c, Carb Counting and benefits of increased activity. Reinforced importance of eating throughout the day with more of a schedule than she has had since retirement. Plan to expand to reading food labels and to review her Log Book at next visit.  Plan:  Aim for 3  Carb Choices per meal (45 grams) +/- 1 either way  Aim for 0-1 Carbs per snack if hungry  Consider reading food labels for Total Carbohydrate and Fat Grams of foods Consider  increasing your activity level by walking for 15-30 minutes daily as tolerated Consider calling your MD office for Rx for strips for your meter  Handouts given during visit include: Living Well with Diabetes Carb Counting  handout Meal Plan Card  Monitoring/Evaluation:  Dietary intake, exercise, SMBG, and body weight in 4 week(s).

## 2013-01-21 NOTE — Patient Instructions (Signed)
Plan:  Aim for 3 Carb Choices per meal (45 grams) +/- 1 either way  Aim for 0-1 Carbs per snack if hungry  Consider reading food labels for Total Carbohydrate and Fat Grams of foods Consider  increasing your activity level by walking for 15-30 minutes daily as tolerated Consider calling your MD office for Rx for strips for your meter

## 2013-01-22 DIAGNOSIS — J309 Allergic rhinitis, unspecified: Secondary | ICD-10-CM | POA: Diagnosis not present

## 2013-01-25 ENCOUNTER — Other Ambulatory Visit: Payer: Self-pay | Admitting: Internal Medicine

## 2013-01-27 DIAGNOSIS — J309 Allergic rhinitis, unspecified: Secondary | ICD-10-CM | POA: Diagnosis not present

## 2013-02-12 DIAGNOSIS — J309 Allergic rhinitis, unspecified: Secondary | ICD-10-CM | POA: Diagnosis not present

## 2013-02-18 ENCOUNTER — Ambulatory Visit: Payer: Medicare Other | Admitting: *Deleted

## 2013-02-19 DIAGNOSIS — J309 Allergic rhinitis, unspecified: Secondary | ICD-10-CM | POA: Diagnosis not present

## 2013-02-26 ENCOUNTER — Other Ambulatory Visit: Payer: Self-pay | Admitting: Internal Medicine

## 2013-03-01 ENCOUNTER — Encounter: Payer: Self-pay | Admitting: *Deleted

## 2013-03-01 ENCOUNTER — Other Ambulatory Visit: Payer: Self-pay | Admitting: *Deleted

## 2013-03-01 DIAGNOSIS — E876 Hypokalemia: Secondary | ICD-10-CM

## 2013-03-01 DIAGNOSIS — J45909 Unspecified asthma, uncomplicated: Secondary | ICD-10-CM

## 2013-03-01 DIAGNOSIS — I10 Essential (primary) hypertension: Secondary | ICD-10-CM

## 2013-03-01 MED ORDER — POTASSIUM CHLORIDE CRYS ER 20 MEQ PO TBCR: EXTENDED_RELEASE_TABLET | ORAL | Status: DC

## 2013-03-01 MED ORDER — ATENOLOL-CHLORTHALIDONE 50-25 MG PO TABS: ORAL_TABLET | ORAL | Status: DC

## 2013-03-01 MED ORDER — SE-TAN PLUS 162-115.2-1 MG PO CAPS: ORAL_CAPSULE | ORAL | Status: DC

## 2013-03-01 MED ORDER — LORATADINE 10 MG PO TABS: 10.0000 mg | ORAL_TABLET | Freq: Every day | ORAL | Status: DC

## 2013-03-05 ENCOUNTER — Ambulatory Visit: Payer: Medicare Other | Admitting: *Deleted

## 2013-03-13 ENCOUNTER — Encounter (HOSPITAL_COMMUNITY): Payer: Self-pay | Admitting: Emergency Medicine

## 2013-03-13 ENCOUNTER — Emergency Department (INDEPENDENT_AMBULATORY_CARE_PROVIDER_SITE_OTHER)
Admission: EM | Admit: 2013-03-13 | Discharge: 2013-03-13 | Disposition: A | Discharge status: Home / Self Care | Payer: Medicare Other | Source: Home / Self Care

## 2013-03-13 DIAGNOSIS — S139XXA Sprain of joints and ligaments of unspecified parts of neck, initial encounter: Secondary | ICD-10-CM | POA: Diagnosis not present

## 2013-03-13 DIAGNOSIS — S161XXA Strain of muscle, fascia and tendon at neck level, initial encounter: Secondary | ICD-10-CM

## 2013-03-13 MED ORDER — TRAMADOL HCL 50 MG PO TABS: 50.0000 mg | ORAL_TABLET | Freq: Four times a day (QID) | ORAL | Status: DC | PRN

## 2013-03-13 NOTE — ED Provider Notes (Signed)
Medical screening examination/treatment/procedure(s) were performed by non-physician practitioner and as supervising physician I was immediately available for consultation/collaboration.   MORENO-COLL,Natsha Guidry; MD  Tyliyah Mcmeekin Moreno-Coll, MD 03/13/13 2004 

## 2013-03-13 NOTE — ED Notes (Signed)
Pt c/o upper back pain in the shoulder blade area that has worked its way up to the neck causing stiffness.  Symptoms present x 2 wks. otc meds and home remedies not working. Gradually gotten worse.  Pt states that this has happened before several months ago but did not last long.

## 2013-03-13 NOTE — ED Provider Notes (Signed)
History     CSN: [id number]  Arrival date & time 03/13/13  1603   None     Chief Complaint  Patient presents with  . Back Pain    upper back pain of shoulder blade area that has worked into the neck causing neck stiffness    (Consider location/radiation/quality/duration/timing/severity/associated sxs/prior treatment) HPI Comments: 67 year old female presents with pain in the back of the neck for 2 weeks. She states pain started in the lower back on the right side migrating up across the shoulders and ended in the back of the neck. Today she no longer has pain in the back of the shoulders chest pain in the splenius capitis muscles. Denies injury or known trauma. Denies paresthesias or weakness.   Past Medical History  Diagnosis Date  . Allergy   . Hyperlipidemia   . Arthritis   . Diabetes mellitus without complication   . Hypertension     Past Surgical History  Procedure Laterality Date  . Colonoscopy  2008  . Flexible sigmoidoscopy  2003  . Abdominal hysterectomy      Family History  Problem Relation Age of Onset  . Hypertension    . Liver cancer    . Stroke      History  Substance Use Topics  . Smoking status: Never Smoker   . Smokeless tobacco: Never Used  . Alcohol Use: No    OB History   Grav Para Term Preterm Abortions TAB SAB Ect Mult Living                  Review of Systems  Constitutional: Negative for fever, chills and activity change.  HENT: Positive for neck pain.   Respiratory: Negative.   Cardiovascular: Negative.   Musculoskeletal:       As per HPI  Skin: Negative for color change, pallor and rash.  Neurological: Negative.     Allergies  Aspirin; Beef-derived products; Celecoxib; Milk-related compounds; Mold extract; Nsaids; Peanuts; Shellfish allergy; and Sulfamethoxazole  Home Medications   Current Outpatient Rx  Name  Route  Sig  Dispense  Refill  . atenolol-chlorthalidone (TENORETIC) 50-25 MG per tablet      TAKE 1 TABLET BY  MOUTH EVERY DAY   90 tablet   3   . calcium citrate-vitamin D (CITRACAL+D) 315-200 MG-UNIT per tablet   Oral   Take 1 tablet by mouth daily.         . cholecalciferol (VITAMIN D) 1000 UNITS tablet   Oral   Take 2,000 Units by mouth daily.         Marland Kitchen doxycycline (VIBRAMYCIN) 50 MG capsule      TAKE ONE CAPSULE BY MOUTH TWICE A DAY   30 capsule   0   . FeFum-FePo-FA-B Cmp-C-Zn-Mn-Cu (SE-TAN PLUS) 162-115.2-1 MG CAPS      TAKE 162 MG BY MOUTH DAILY.   90 capsule   3   . Linagliptin-Metformin HCl (JENTADUETO) 2.5-500 MG TABS   Oral   Take 1 tablet by mouth daily.   60 tablet      . loratadine (CLARITIN) 10 MG tablet   Oral   Take 1 tablet (10 mg total) by mouth daily.   90 tablet   3   . potassium chloride SA (KLOR-CON M20) 20 MEQ tablet      TAKE 1 TABLET EVERY DAY   90 tablet   3     \   . traMADol (ULTRAM) 50 MG tablet   Oral  Take 1 tablet (50 mg total) by mouth every 6 (six) hours as needed for pain.   15 tablet   0     BP 145/88  Pulse 71  Temp(Src) 98.1 F (36.7 C) (Oral)  Resp 18  SpO2 100%  Physical Exam  Nursing note and vitals reviewed. Constitutional: She is oriented to person, place, and time. She appears well-developed and well-nourished. No distress.  HENT:  Head: Normocephalic and atraumatic.  Eyes: EOM are normal. Pupils are equal, round, and reactive to light.  Neck: Normal range of motion. Neck supple.  Musculoskeletal:  Tenderness to the bilateral splenius capitis muscles. Pain is worse with neck flexion. No spinal pain or tenderness. No tenderness or pain in the trapezii or back. She is able to rotate her head left and right 45 each. Neck flexion to 45.  Lymphadenopathy:    She has no cervical adenopathy.  Neurological: She is alert and oriented to person, place, and time. No cranial nerve deficit.  Skin: Skin is warm and dry.  Psychiatric: She has a normal mood and affect.    ED Course  Procedures (including critical  care time)  Labs Reviewed - No data to display No results found.   1. Cervical strain, acute, initial encounter       MDM  Continue taking the ibuprofen as your physician suggested   tramadol50 mg   every 4-6 hours when necessary pain #15 Continue the stretches and application of heat to help with muscle pain. Followup with her primary care doctor as needed.        Hayden Rasmussen, NP 03/13/13 1914  Hayden Rasmussen, NP 03/13/13 [phone]

## 2013-03-17 ENCOUNTER — Encounter: Payer: Medicare Other | Attending: Internal Medicine | Admitting: *Deleted

## 2013-03-17 ENCOUNTER — Encounter: Payer: Self-pay | Admitting: *Deleted

## 2013-03-17 VITALS — Ht 65.0 in | Wt 166.8 lb

## 2013-03-17 DIAGNOSIS — E119 Type 2 diabetes mellitus without complications: Secondary | ICD-10-CM | POA: Diagnosis not present

## 2013-03-17 DIAGNOSIS — J309 Allergic rhinitis, unspecified: Secondary | ICD-10-CM | POA: Diagnosis not present

## 2013-03-17 DIAGNOSIS — Z713 Dietary counseling and surveillance: Secondary | ICD-10-CM | POA: Insufficient documentation

## 2013-03-17 DIAGNOSIS — IMO0001 Reserved for inherently not codable concepts without codable children: Secondary | ICD-10-CM

## 2013-03-17 NOTE — Patient Instructions (Signed)
Plan:  Continue with your increased awareness of your Carb Choices during the day  Continue reading food labels for Total Carbohydrate of foods Consider  increasing your activity level by checking into pool options in the area for a water aerobics class as tolerated Consider calling your MD office for Rx for strips for your meter

## 2013-03-17 NOTE — Progress Notes (Signed)
  Medical Nutrition Therapy:  Appt start time: 1500 end time:  1600.  Assessment:  Primary concerns today: patient here for diabetes education follow up visit. She has had problem with back and neck pain over the past 2 weeks. Still has not gotten Rx for strips yet, stating  the neck pain has taken over her life. No change in eating habits yet either  MEDICATIONS: see list   DIETARY INTAKE:  Usual eating pattern includes 2-3 meals and 1-2 snacks per day.  Everyday foods include limited variety of food groups.  Avoided foods include allergic to milk, beef, wheat bread, seafood, shellfish, beans, peanut butter.    24-hr recall:  B ( AM): states she has tried to start eating some oatmeal OR banana Snk ( AM): occasionally a piece of fruit   L ( PM): varies quite a bit, may eat breakfast foods or any other combination. Water and juice Snk ( PM): same as AM or  D ( PM): depends on her appetite, has many food allergies, Snk ( PM): varies Beverages: water, fruit juice  Usual physical activity: limited due to neck pain. She can turn her head to the right but not to the left  Estimated energy needs: 1400 calories 158 g carbohydrates 105 g protein 39 g fat  Progress Towards Goal(s):  In progress.   Nutritional Diagnosis:  NB-1.1 Food and nutrition-related knowledge deficit As related to new diagnosis of diabetes.  As evidenced by A1c of 9.6% on 01/04/13 improved from 12.9% on 10/20/12.    Intervention: Reviewed Carb Counting and reading food labels today. Also suggested types of activity that would be appropriate with back pain such as water exercises. Provide a list of locations where pools are available.   Plan:  Continue with your increased awareness of your Carb Choices during the day  Continue reading food labels for Total Carbohydrate of foods Consider  increasing your activity level by checking into pool options in the area for a water aerobics class as tolerated Consider calling  your MD office for Rx for strips for your meter   Handouts given during visit include: Carb Counting  handout Meal Plan Card List of pool locations in area  Monitoring/Evaluation:  Dietary intake, exercise, SMBG, and body weight PRN. Pt states she will call if she needs further assistance.

## 2013-03-26 DIAGNOSIS — J309 Allergic rhinitis, unspecified: Secondary | ICD-10-CM | POA: Diagnosis not present

## 2013-03-30 DIAGNOSIS — J309 Allergic rhinitis, unspecified: Secondary | ICD-10-CM | POA: Diagnosis not present

## 2013-04-01 ENCOUNTER — Other Ambulatory Visit: Payer: Self-pay | Admitting: Family

## 2013-04-12 ENCOUNTER — Ambulatory Visit (INDEPENDENT_AMBULATORY_CARE_PROVIDER_SITE_OTHER): Payer: Medicare Other | Admitting: Internal Medicine

## 2013-04-12 ENCOUNTER — Encounter: Payer: Self-pay | Admitting: Internal Medicine

## 2013-04-12 VITALS — BP 130/80 | HR 76 | Temp 98.2°F | Resp 16 | Ht 65.0 in | Wt 166.0 lb

## 2013-04-12 DIAGNOSIS — T887XXA Unspecified adverse effect of drug or medicament, initial encounter: Secondary | ICD-10-CM | POA: Diagnosis not present

## 2013-04-12 DIAGNOSIS — J309 Allergic rhinitis, unspecified: Secondary | ICD-10-CM | POA: Diagnosis not present

## 2013-04-12 DIAGNOSIS — Z0000 Encounter for general adult medical examination without abnormal findings: Secondary | ICD-10-CM

## 2013-04-12 DIAGNOSIS — E1165 Type 2 diabetes mellitus with hyperglycemia: Secondary | ICD-10-CM | POA: Diagnosis not present

## 2013-04-12 DIAGNOSIS — E785 Hyperlipidemia, unspecified: Secondary | ICD-10-CM | POA: Diagnosis not present

## 2013-04-12 DIAGNOSIS — E1169 Type 2 diabetes mellitus with other specified complication: Secondary | ICD-10-CM

## 2013-04-12 DIAGNOSIS — I10 Essential (primary) hypertension: Secondary | ICD-10-CM

## 2013-04-12 LAB — CBC WITH DIFFERENTIAL/PLATELET
Basophils Absolute: 0 10*3/uL (ref 0.0–0.1)
Basophils Relative: 0.2 % (ref 0.0–3.0)
Eosinophils Absolute: 0.2 10*3/uL (ref 0.0–0.7)
Eosinophils Relative: 2.6 % (ref 0.0–5.0)
HCT: 37 % (ref 36.0–46.0)
Hemoglobin: 12.3 g/dL (ref 12.0–15.0)
Lymphocytes Relative: 38.7 % (ref 12.0–46.0)
Lymphs Abs: 2.9 10*3/uL (ref 0.7–4.0)
MCHC: 33.2 g/dL (ref 30.0–36.0)
MCV: 80.7 fl (ref 78.0–100.0)
Monocytes Absolute: 0.6 10*3/uL (ref 0.1–1.0)
Monocytes Relative: 8.4 % (ref 3.0–12.0)
Neutro Abs: 3.8 10*3/uL (ref 1.4–7.7)
Neutrophils Relative %: 50.1 % (ref 43.0–77.0)
Platelets: 190 10*3/uL (ref 150.0–400.0)
RBC: 4.58 Mil/uL (ref 3.87–5.11)
RDW: 12.9 % (ref 11.5–14.6)
WBC: 7.5 10*3/uL (ref 4.5–10.5)

## 2013-04-12 LAB — BASIC METABOLIC PANEL
BUN: 17 mg/dL (ref 6–23)
CO2: 30 mEq/L (ref 19–32)
Calcium: 9 mg/dL (ref 8.4–10.5)
Chloride: 99 mEq/L (ref 96–112)
Creatinine, Ser: 0.7 mg/dL (ref 0.4–1.2)
GFR: 105.16 mL/min (ref >60.00)
Glucose, Bld: 164 mg/dL — ABNORMAL HIGH (ref 70–99)
Potassium: 3.8 mEq/L (ref 3.5–5.1)
Sodium: 140 mEq/L (ref 135–145)

## 2013-04-12 LAB — HEPATIC FUNCTION PANEL
ALT: 25 U/L (ref 0–35)
AST: 29 U/L (ref 0–37)
Albumin: 3.8 g/dL (ref 3.5–5.2)
Alkaline Phosphatase: 73 U/L (ref 39–117)
Bilirubin, Direct: 0 mg/dL (ref 0.0–0.3)
Total Bilirubin: 0.3 mg/dL (ref 0.3–1.2)
Total Protein: 7.5 g/dL (ref 6.0–8.3)

## 2013-04-12 LAB — LIPID PANEL
Cholesterol: 191 mg/dL (ref 0–200)
HDL: 56.9 mg/dL (ref >39.00)
LDL Cholesterol: 109 mg/dL — ABNORMAL HIGH (ref 0–99)
Total CHOL/HDL Ratio: 3
Triglycerides: 124 mg/dL (ref 0.0–149.0)
VLDL: 24.8 mg/dL (ref 0.0–40.0)

## 2013-04-12 LAB — TSH: TSH: 0.51 u[IU]/mL (ref 0.35–5.50)

## 2013-04-12 LAB — HEMOGLOBIN A1C: Hgb A1c MFr Bld: 8 % — ABNORMAL HIGH (ref 4.6–6.5)

## 2013-04-12 NOTE — Progress Notes (Signed)
Subjective:    Patient ID: Pam Obrien, female    DOB: 01/31/1946, 67 y.o.   MRN: [id number]  HPI Patient is a 67 year old female who presents for her Medicare wellness examination and followup for diabetes hypertension and gastroesophageal reflux she also has a pigmented nevus on her back that is concerned since it has been growing.  She states she was stable her medications her diabetes has been stable her blood pressures been stable she has been appearance to her planned both her diabetes and hypertension   Review of Systems  Constitutional: Negative for activity change, appetite change and fatigue.  HENT: Negative for ear pain, congestion, neck pain, postnasal drip and sinus pressure.   Eyes: Negative for redness and visual disturbance.  Respiratory: Negative for cough, shortness of breath and wheezing.   Gastrointestinal: Negative for abdominal pain and abdominal distention.  Genitourinary: Negative for dysuria, frequency and menstrual problem.  Musculoskeletal: Negative for myalgias, joint swelling and arthralgias.  Skin: Negative for rash and wound.  Neurological: Negative for dizziness, weakness and headaches.  Hematological: Negative for adenopathy. Does not bruise/bleed easily.  Psychiatric/Behavioral: Negative for sleep disturbance and decreased concentration.       Objective:   Physical Exam  Constitutional: She is oriented to person, place, and time. She appears well-developed and well-nourished. No distress.  HENT:  Head: Normocephalic and atraumatic.  Right Ear: External ear normal.  Left Ear: External ear normal.  Nose: Nose normal.  Mouth/Throat: Oropharynx is clear and moist.  Eyes: Conjunctivae and EOM are normal. Pupils are equal, round, and reactive to light.  Neck: Normal range of motion. Neck supple. No JVD present. No tracheal deviation present. No thyromegaly present.  Cardiovascular: Normal rate, regular rhythm and normal heart sounds.   No murmur  heard. Pulmonary/Chest: Effort normal and breath sounds normal. She has no wheezes. She exhibits no tenderness.  Abdominal: Soft. Bowel sounds are normal.  Musculoskeletal: Normal range of motion. She exhibits no edema and no tenderness.  Lymphadenopathy:    She has no cervical adenopathy.  Neurological: She is alert and oriented to person, place, and time. She has normal reflexes. No cranial nerve deficit.  Skin: Skin is warm and dry. She is not diaphoretic.  Large pigmented mole on back with skin tag-like growth  Psychiatric: She has a normal mood and affect. Her behavior is normal.          Assessment & Plan:  Pigmented nevus on back Monitoring of her diabetes including foot examination hemoglobin A1c a basic metabolic panel Stable blood pressure and current medications Cholesterol screening given history of diabetes Mild neuropathy noted Subjective:    Pam Obrien is a 67 y.o. female who presents for Medicare Annual/Subsequent preventive examination.  Preventive Screening-Counseling & Management  Tobacco History  Smoking status  . Never Smoker   Smokeless tobacco  . Never Used     Problems Prior to Visit 1.   Current Problems (verified) Patient Active Problem List   Diagnosis Date Noted  . Type II or unspecified type diabetes mellitus without mention of complication, uncontrolled 01/04/2013  . ESOPHAGEAL REFLUX 06/18/2010  . HYPOKALEMIA, MILD 06/02/2009  . OSTEOPENIA 12/11/2007  . COLONIC POLYPS, HX OF 12/11/2007  . HYPERTENSION 06/23/2007  . ALLERGIC RHINITIS 06/23/2007  . OSTEOARTHRITIS 06/23/2007    Medications Prior to Visit Current Outpatient Prescriptions on File Prior to Visit  Medication Sig Dispense Refill  . atenolol-chlorthalidone (TENORETIC) 50-25 MG per tablet TAKE 1 TABLET BY MOUTH EVERY DAY  90 tablet  3  . calcium citrate-vitamin D (CITRACAL+D) 315-200 MG-UNIT per tablet Take 1 tablet by mouth daily.      . cholecalciferol (VITAMIN  D) 1000 UNITS tablet Take 2,000 Units by mouth daily.      Marland Kitchen FeFum-FePo-FA-B Cmp-C-Zn-Mn-Cu (SE-TAN PLUS) 162-115.2-1 MG CAPS TAKE 162 MG BY MOUTH DAILY.  90 capsule  3  . ketoconazole (NIZORAL) 2 % shampoo APPLY TOPICALLY 2 TIMES A WEEK  120 mL  2  . Linagliptin-Metformin HCl (JENTADUETO) 2.5-500 MG TABS Take 1 tablet by mouth daily.  60 tablet    . loratadine (CLARITIN) 10 MG tablet Take 1 tablet (10 mg total) by mouth daily.  90 tablet  3  . potassium chloride SA (KLOR-CON M20) 20 MEQ tablet TAKE 1 TABLET EVERY DAY  90 tablet  3  . traMADol (ULTRAM) 50 MG tablet Take 1 tablet (50 mg total) by mouth every 6 (six) hours as needed for pain.  15 tablet  0   No current facility-administered medications on file prior to visit.    Current Medications (verified) Current Outpatient Prescriptions  Medication Sig Dispense Refill  . atenolol-chlorthalidone (TENORETIC) 50-25 MG per tablet TAKE 1 TABLET BY MOUTH EVERY DAY  90 tablet  3  . calcium citrate-vitamin D (CITRACAL+D) 315-200 MG-UNIT per tablet Take 1 tablet by mouth daily.      . cholecalciferol (VITAMIN D) 1000 UNITS tablet Take 2,000 Units by mouth daily.      Marland Kitchen FeFum-FePo-FA-B Cmp-C-Zn-Mn-Cu (SE-TAN PLUS) 162-115.2-1 MG CAPS TAKE 162 MG BY MOUTH DAILY.  90 capsule  3  . ketoconazole (NIZORAL) 2 % shampoo APPLY TOPICALLY 2 TIMES A WEEK  120 mL  2  . Linagliptin-Metformin HCl (JENTADUETO) 2.5-500 MG TABS Take 1 tablet by mouth daily.  60 tablet    . loratadine (CLARITIN) 10 MG tablet Take 1 tablet (10 mg total) by mouth daily.  90 tablet  3  . potassium chloride SA (KLOR-CON M20) 20 MEQ tablet TAKE 1 TABLET EVERY DAY  90 tablet  3  . traMADol (ULTRAM) 50 MG tablet Take 1 tablet (50 mg total) by mouth every 6 (six) hours as needed for pain.  15 tablet  0   No current facility-administered medications for this visit.     Allergies (verified) Aspirin; Beef-derived products; Celecoxib; Milk-related compounds; Mold extract; Nsaids; Peanuts;  Shellfish allergy; and Sulfamethoxazole   PAST HISTORY  Family History Family History  Problem Relation Age of Onset  . Hypertension    . Liver cancer    . Stroke      Social History History  Substance Use Topics  . Smoking status: Never Smoker   . Smokeless tobacco: Never Used  . Alcohol Use: No     Are there smokers in your home (other than you)? No  Risk Factors Current exercise habits: The patient does not participate in regular exercise at present.  Dietary issues discussed: diabetic diet reveiwed   Cardiac risk factors: diabetes mellitus, dyslipidemia, family history of premature cardiovascular disease and hypertension.  Depression Screen (Note: if answer to either of the following is "Yes", a more complete depression screening is indicated)   Over the past two weeks, have you felt down, depressed or hopeless? No  Over the past two weeks, have you felt little interest or pleasure in doing things? No  Have you lost interest or pleasure in daily life? No  Do you often feel hopeless? No  Do you cry easily over simple problems? No  Activities of Daily Living In your present state of health, do you have any difficulty performing the following activities?:  Driving? No Managing money?  No Feeding yourself? No Getting from bed to chair? No Climbing a flight of stairs? No Preparing food and eating?: No Bathing or showering? No Getting dressed: No Getting to the toilet? No Using the toilet:No Moving around from place to place: No In the past year have you fallen or had a near fall?:No   Are you sexually active?  Yes  Do you have more than one partner?  No  Hearing Difficulties: No Do you often ask people to speak up or repeat themselves? No Do you experience ringing or noises in your ears? No Do you have difficulty understanding soft or whispered voices? Yes   Do you feel that you have a problem with memory? No  Do you often misplace items? No  Do you feel  safe at home?  Yes  Cognitive Testing  Alert? Yes  Normal Appearance?Yes  Oriented to person? Yes  Place? Yes   Time? Yes  Recall of three objects?  Yes  Can perform simple calculations? Yes  Displays appropriate judgment?Yes  Can read the correct time from a watch face?Yes   Advanced Directives have been discussed with the patient? Yes  List the Names of Other Physician/Practitioners you currently use: 1.    Indicate any recent Medical Services you may have received from other than [facility] providers in the past year (date may be approximate).  Immunization History  Administered Date(s) Administered  . Influenza Split 09/03/2012  . Pneumococcal Polysaccharide 12/28/2009  . Td 11/11/2006    Screening Tests Health Maintenance  Topic Date Due  . Colonoscopy  11/20/1994  . Zostavax  11/20/2004  . Mammogram  03/04/2011  . Influenza Vaccine  07/12/2013  . Tetanus/tdap  11/11/2016  . Pneumococcal Polysaccharide Vaccine Age 29 And Over  Completed    All answers were reviewed with the patient and necessary referrals were made:  Carrie Mew, MD   04/12/2013   History reviewed: allergies, current medications, past family history, past medical history, past social history, past surgical history and problem list  Review of Systems Pertinent items are noted in HPI.    Objective:     Vision by Snellen chart: right eye:20/20, left eye:20/20  Body mass index is 27.62 kg/(m^2). BP 130/80  Pulse 76  Temp(Src) 98.2 F (36.8 C)  Resp 16  Ht 5\' 5"  (1.651 m)  Wt 166 lb (75.297 kg)  BMI 27.62 kg/m2  BP 130/80  Pulse 76  Temp(Src) 98.2 F (36.8 C)  Resp 16  Ht 5\' 5"  (1.651 m)  Wt 166 lb (75.297 kg)  BMI 27.62 kg/m2  General Appearance:    Alert, cooperative, no distress, appears stated age  Head:    Normocephalic, without obvious abnormality, atraumatic  Eyes:    PERRL, conjunctiva/corneas clear, EOM's intact, fundi    benign, both eyes  Ears:    Normal TM's and  external ear canals, both ears  Nose:   Nares normal, septum midline, mucosa normal, no drainage    or sinus tenderness  Throat:   Lips, mucosa, and tongue normal; teeth and gums normal  Neck:   Supple, symmetrical, trachea midline, no adenopathy;    thyroid:  no enlargement/tenderness/nodules; no carotid   bruit or JVD  Back:     Symmetric, no curvature, ROM normal, no CVA tenderness  Lungs:     Clear to auscultation bilaterally, respirations  unlabored  Chest Wall:    No tenderness or deformity   Heart:    Regular rate and rhythm, S1 and S2 normal, no murmur, rub   or gallop  Breast Exam:    No tenderness, masses, or nipple abnormality  Abdomen:     Soft, non-tender, bowel sounds active all four quadrants,    no masses, no organomegaly  Genitalia:    Normal female without lesion, discharge or tenderness  Rectal:    Normal tone, normal prostate, no masses or tenderness;   guaiac negative stool  Extremities:   Extremities normal, atraumatic, no cyanosis or edema  Pulses:   2+ and symmetric all extremities  Skin:   Skin color, texture, turgor normal, no rashes or lesions  Lymph nodes:   Cervical, supraclavicular, and axillary nodes normal  Neurologic:   CNII-XII intact, normal strength, sensation and reflexes    throughout       Assessment:      This is a routine physical examination for this healthy  Female. Reviewed all health maintenance protocols including mammography colonoscopy bone density and reviewed appropriate screening labs. Her immunization history was reviewed as well as her current medications and allergies refills of her chronic medications were given and the plan for yearly health maintenance was discussed all orders and referrals were made as appropriate.      Plan:     During the course of the visit the patient was educated and counseled about appropriate screening and preventive services including:    Screening mammography  Bone densitometry  screening  Diabetes screening  Diet review for nutrition referral? Yes xNot Indicated ____   Patient Instructions (the written plan) was given to the patient.  Medicare Attestation I have personally reviewed: The patient's medical and social history Their use of alcohol, tobacco or illicit drugs Their current medications and supplements The patient's functional ability including ADLs,fall risks, home safety risks, cognitive, and hearing and visual impairment Diet and physical activities Evidence for depression or mood disorders  The patient's weight, height, BMI, and visual acuity have been recorded in the chart.  I have made referrals, counseling, and provided education to the patient based on review of the above and I have provided the patient with a written personalized care plan for preventive services.     Carrie Mew, MD   04/12/2013

## 2013-04-20 DIAGNOSIS — J301 Allergic rhinitis due to pollen: Secondary | ICD-10-CM | POA: Diagnosis not present

## 2013-04-30 DIAGNOSIS — J309 Allergic rhinitis, unspecified: Secondary | ICD-10-CM | POA: Diagnosis not present

## 2013-05-05 DIAGNOSIS — J309 Allergic rhinitis, unspecified: Secondary | ICD-10-CM | POA: Diagnosis not present

## 2013-06-04 DIAGNOSIS — J309 Allergic rhinitis, unspecified: Secondary | ICD-10-CM | POA: Diagnosis not present

## 2013-06-11 DIAGNOSIS — J309 Allergic rhinitis, unspecified: Secondary | ICD-10-CM | POA: Diagnosis not present

## 2013-06-25 DIAGNOSIS — J309 Allergic rhinitis, unspecified: Secondary | ICD-10-CM | POA: Diagnosis not present

## 2013-07-01 ENCOUNTER — Other Ambulatory Visit: Payer: Self-pay | Admitting: Internal Medicine

## 2013-07-08 DIAGNOSIS — J309 Allergic rhinitis, unspecified: Secondary | ICD-10-CM | POA: Diagnosis not present

## 2013-07-23 DIAGNOSIS — J309 Allergic rhinitis, unspecified: Secondary | ICD-10-CM | POA: Diagnosis not present

## 2013-07-26 ENCOUNTER — Other Ambulatory Visit: Payer: Self-pay | Admitting: Internal Medicine

## 2013-07-26 ENCOUNTER — Other Ambulatory Visit: Payer: Self-pay | Admitting: *Deleted

## 2013-07-26 MED ORDER — POTASSIUM CHLORIDE CRYS ER 20 MEQ PO TBCR: EXTENDED_RELEASE_TABLET | ORAL | Status: DC

## 2013-07-27 DIAGNOSIS — J309 Allergic rhinitis, unspecified: Secondary | ICD-10-CM | POA: Diagnosis not present

## 2013-07-30 ENCOUNTER — Ambulatory Visit (INDEPENDENT_AMBULATORY_CARE_PROVIDER_SITE_OTHER): Payer: Medicare Other | Admitting: Family Medicine

## 2013-07-30 ENCOUNTER — Encounter: Payer: Self-pay | Admitting: Family Medicine

## 2013-07-30 VITALS — BP 128/72 | HR 88 | Temp 98.0°F | Wt 171.0 lb

## 2013-07-30 DIAGNOSIS — M25469 Effusion, unspecified knee: Secondary | ICD-10-CM | POA: Diagnosis not present

## 2013-07-30 DIAGNOSIS — J309 Allergic rhinitis, unspecified: Secondary | ICD-10-CM | POA: Diagnosis not present

## 2013-07-30 DIAGNOSIS — M25462 Effusion, left knee: Secondary | ICD-10-CM

## 2013-07-30 NOTE — Patient Instructions (Addendum)
Touch base in 1-2 weeks if swelling recurs. Ice 2-3 times per day Elevate leg frequently

## 2013-07-30 NOTE — Progress Notes (Signed)
  Subjective:    Patient ID: Pam Obrien, female    DOB: 08/17/1946, 67 y.o.   MRN: [id number]  HPI Patient seen with left knee pain. Onset about 3 weeks ago. No specific injury. She has noted some edema during the past 3 weeks. Mild warmth. No erythema. No fevers or chills. No weakness. She's taken some ibuprofen which helps slightly with her pain. No prior history of knee difficulties other than she states once before that she had an effusion that was drained the orthopedist and felt better afterwards. She denies any history of gout or pseudogout.  Past Medical History  Diagnosis Date  . Allergy   . Hyperlipidemia   . Arthritis   . Diabetes mellitus without complication   . Hypertension    Past Surgical History  Procedure Laterality Date  . Colonoscopy  2008  . Flexible sigmoidoscopy  2003  . Abdominal hysterectomy      reports that she has never smoked. She has never used smokeless tobacco. She reports that she does not drink alcohol or use illicit drugs. family history includes Hypertension in an other family member; Liver cancer in an other family member; Stroke in an other family member. Allergies  Allergen Reactions  . Aspirin     REACTION: nervous  . Beef-Derived Products   . Celecoxib     REACTION: joints swell  . Milk-Related Compounds   . Mold Extract [Trichophyton]   . Nsaids     REACTION: sweling  . Peanuts [Peanut Oil]   . Shellfish Allergy   . Sulfamethoxazole     REACTION: urticaria (hives)        Review of Systems  Constitutional: Negative for fever and chills.  Musculoskeletal: Positive for arthralgias.       Objective:   Physical Exam  Constitutional: She appears well-developed and well-nourished.  Cardiovascular: Normal rate and regular rhythm.   Pulmonary/Chest: Effort normal and breath sounds normal. No respiratory distress. She has no wheezes. She has no rales.  Musculoskeletal:  left knee moderate effusion.  Slightly warm to  touch.  Full ROM.  No point tenderness. No ecchymosis.          Assessment & Plan:  Left knee effusion. No signs of infection.  Discussed risks and benefits of knee aspiration and steroid injection and pt consented.  Left knee  Prepped with betadine.  Using sterile technique, aspirated 20 cc clear fluid using superior/lateral approach using 22 gauge one and one half inch needle.  Then injected 40 mg depomedrol with same needle in place.  Pt tolerated well.  She is aware effusion may re-accumulate .  Follow up with primary if persists.

## 2013-08-06 DIAGNOSIS — J309 Allergic rhinitis, unspecified: Secondary | ICD-10-CM | POA: Diagnosis not present

## 2013-08-13 DIAGNOSIS — J309 Allergic rhinitis, unspecified: Secondary | ICD-10-CM | POA: Diagnosis not present

## 2013-08-20 DIAGNOSIS — J309 Allergic rhinitis, unspecified: Secondary | ICD-10-CM | POA: Diagnosis not present

## 2013-08-24 ENCOUNTER — Telehealth: Payer: Self-pay | Admitting: Internal Medicine

## 2013-08-24 NOTE — Telephone Encounter (Signed)
Pt instructed to call back if knee not better. Knee continues to swell, goes down them back up again. Hurts. pls advise Pt saw Dr Caryl Never 9/19.

## 2013-08-25 NOTE — Telephone Encounter (Signed)
Pt informed

## 2013-08-25 NOTE — Telephone Encounter (Signed)
Left message for patient to return call.

## 2013-08-25 NOTE — Telephone Encounter (Signed)
Follow up with primary

## 2013-08-25 NOTE — Telephone Encounter (Signed)
Patient stated that her knee is fine and then when see gets to walking around her knee will swell back up. Or if her knee is not propped up.

## 2013-08-27 DIAGNOSIS — J309 Allergic rhinitis, unspecified: Secondary | ICD-10-CM | POA: Diagnosis not present

## 2013-09-03 DIAGNOSIS — J309 Allergic rhinitis, unspecified: Secondary | ICD-10-CM | POA: Diagnosis not present

## 2013-09-10 DIAGNOSIS — J309 Allergic rhinitis, unspecified: Secondary | ICD-10-CM | POA: Diagnosis not present

## 2013-09-21 ENCOUNTER — Other Ambulatory Visit: Payer: Self-pay | Admitting: Internal Medicine

## 2013-09-24 DIAGNOSIS — J309 Allergic rhinitis, unspecified: Secondary | ICD-10-CM | POA: Diagnosis not present

## 2013-09-29 DIAGNOSIS — H5200 Hypermetropia, unspecified eye: Secondary | ICD-10-CM | POA: Diagnosis not present

## 2013-09-29 DIAGNOSIS — I10 Essential (primary) hypertension: Secondary | ICD-10-CM | POA: Diagnosis not present

## 2013-09-29 DIAGNOSIS — H524 Presbyopia: Secondary | ICD-10-CM | POA: Diagnosis not present

## 2013-09-29 DIAGNOSIS — H52229 Regular astigmatism, unspecified eye: Secondary | ICD-10-CM | POA: Diagnosis not present

## 2013-10-01 DIAGNOSIS — J309 Allergic rhinitis, unspecified: Secondary | ICD-10-CM | POA: Diagnosis not present

## 2013-10-13 ENCOUNTER — Encounter: Payer: Self-pay | Admitting: Internal Medicine

## 2013-10-13 ENCOUNTER — Ambulatory Visit (INDEPENDENT_AMBULATORY_CARE_PROVIDER_SITE_OTHER): Payer: Medicare Other | Admitting: Internal Medicine

## 2013-10-13 VITALS — BP 130/80 | HR 76 | Temp 99.3°F | Resp 18 | Ht 65.0 in | Wt 171.0 lb

## 2013-10-13 DIAGNOSIS — J069 Acute upper respiratory infection, unspecified: Secondary | ICD-10-CM | POA: Diagnosis not present

## 2013-10-13 DIAGNOSIS — E1165 Type 2 diabetes mellitus with hyperglycemia: Secondary | ICD-10-CM | POA: Diagnosis not present

## 2013-10-13 LAB — COMPREHENSIVE METABOLIC PANEL
ALT: 19 U/L (ref 0–35)
AST: 20 U/L (ref 0–37)
Albumin: 3.9 g/dL (ref 3.5–5.2)
Alkaline Phosphatase: 88 U/L (ref 39–117)
BUN: 12 mg/dL (ref 6–23)
CO2: 31 mEq/L (ref 19–32)
Calcium: 9.1 mg/dL (ref 8.4–10.5)
Chloride: 97 mEq/L (ref 96–112)
Creatinine, Ser: 0.7 mg/dL (ref 0.4–1.2)
GFR: 105 mL/min (ref >60.00)
Glucose, Bld: 234 mg/dL — ABNORMAL HIGH (ref 70–99)
Potassium: 3.3 mEq/L — ABNORMAL LOW (ref 3.5–5.1)
Sodium: 139 mEq/L (ref 135–145)
Total Bilirubin: 0.5 mg/dL (ref 0.3–1.2)
Total Protein: 7.7 g/dL (ref 6.0–8.3)

## 2013-10-13 LAB — HEMOGLOBIN A1C: Hgb A1c MFr Bld: 8.8 % — ABNORMAL HIGH (ref 4.6–6.5)

## 2013-10-13 NOTE — Patient Instructions (Signed)
The patient is instructed to continue all medications as prescribed. Schedule followup with check out clerk upon leaving the clinic  

## 2013-10-13 NOTE — Progress Notes (Signed)
Subjective:    Patient ID: Pam Obrien, female    DOB: [birth year], 68 y.o.   MRN: [id number]  Cough Associated symptoms include postnasal drip and rhinorrhea.  Hypertension  Diabetes    Patient is a 67 year old female in today for followup of diabetes and hypertension.  She has a chief complaint today of an upper respiratory tract infection with runny nose sore throat and sinus congestion  Review of Systems  HENT: Positive for postnasal drip, rhinorrhea and sinus pressure.   Eyes: Negative.   Respiratory: Positive for cough.   Endocrine: Negative.   Genitourinary: Negative.    Past Medical History  Diagnosis Date  . Allergy   . Hyperlipidemia   . Arthritis   . Diabetes mellitus without complication   . Hypertension     History   Social History  . Marital Status: Married    Spouse Name: N/A    Number of Children: N/A  . Years of Education: N/A   Occupational History  . Not on file.   Social History Main Topics  . Smoking status: Never Smoker   . Smokeless tobacco: Never Used  . Alcohol Use: No  . Drug Use: No  . Sexual Activity: Not on file   Other Topics Concern  . Not on file   Social History Narrative  . No narrative on file    Past Surgical History  Procedure Laterality Date  . Colonoscopy  2008  . Flexible sigmoidoscopy  2003  . Abdominal hysterectomy      Family History  Problem Relation Age of Onset  . Hypertension    . Liver cancer    . Stroke      Allergies  Allergen Reactions  . Aspirin     REACTION: nervous  . Beef-Derived Products   . Celecoxib     REACTION: joints swell  . Milk-Related Compounds   . Mold Extract [Trichophyton]   . Nsaids     REACTION: sweling  . Peanuts [Peanut Oil]   . Shellfish Allergy   . Sulfamethoxazole     REACTION: urticaria (hives)    Current Outpatient Prescriptions on File Prior to Visit  Medication Sig Dispense Refill  . atenolol-chlorthalidone (TENORETIC) 50-25 MG per tablet  TAKE 1 TABLET BY MOUTH EVERY DAY  90 tablet  2  . calcium citrate-vitamin D (CITRACAL+D) 315-200 MG-UNIT per tablet Take 1 tablet by mouth daily.      . cholecalciferol (VITAMIN D) 1000 UNITS tablet Take 2,000 Units by mouth daily.      Marland Kitchen FeFum-FePo-FA-B Cmp-C-Zn-Mn-Cu (SE-TAN PLUS) 162-115.2-1 MG CAPS TAKE 162 MG BY MOUTH DAILY.  90 capsule  3  . ketoconazole (NIZORAL) 2 % shampoo APPLY TOPICALLY 2 TIMES A WEEK  120 mL  2  . Linagliptin-Metformin HCl (JENTADUETO) 2.5-500 MG TABS Take 1 tablet by mouth daily.  60 tablet    . loratadine (CLARITIN) 10 MG tablet Take 1 tablet (10 mg total) by mouth daily.  90 tablet  3  . potassium chloride SA (K-DUR,KLOR-CON) 20 MEQ tablet 1 qd   90 tablet  3  . traMADol (ULTRAM) 50 MG tablet Take 1 tablet (50 mg total) by mouth every 6 (six) hours as needed for pain.  15 tablet  0   No current facility-administered medications on file prior to visit.    BP 130/80  Pulse 76  Temp(Src) 99.3 F (37.4 C)  Resp 18  Ht 5\' 5"  (1.651 m)  Wt 171 lb (77.565 kg)  BMI  28.46 kg/m2       Objective:   Physical Exam  Nursing note and vitals reviewed. Constitutional: She is oriented to person, place, and time. She appears well-developed and well-nourished. No distress.  HENT:  Head: Normocephalic and atraumatic.  Eyes: Conjunctivae and EOM are normal. Pupils are equal, round, and reactive to light.  Neck: Normal range of motion. Neck supple. No JVD present. No tracheal deviation present. No thyromegaly present.  Cardiovascular: Normal rate and regular rhythm.   No murmur heard. Pulmonary/Chest: Effort normal and breath sounds normal. She has no wheezes. She exhibits no tenderness.  Abdominal: Soft. Bowel sounds are normal.  Musculoskeletal: Normal range of motion. She exhibits no edema and no tenderness.  Lymphadenopathy:    She has no cervical adenopathy.  Neurological: She is alert and oriented to person, place, and time. She has normal reflexes. No cranial  nerve deficit.  Skin: Skin is warm and dry. She is not diaphoretic.  Psychiatric: She has a normal mood and affect. Her behavior is normal.          Assessment & Plan:  Dm MEASURE a1C Uri SAMPLES OF MEDICATIONS FOR COUGH STABLE htn djd OF KNEE

## 2013-10-29 DIAGNOSIS — J309 Allergic rhinitis, unspecified: Secondary | ICD-10-CM | POA: Diagnosis not present

## 2013-11-03 DIAGNOSIS — J309 Allergic rhinitis, unspecified: Secondary | ICD-10-CM | POA: Diagnosis not present

## 2013-11-10 DIAGNOSIS — J301 Allergic rhinitis due to pollen: Secondary | ICD-10-CM | POA: Diagnosis not present

## 2013-11-10 DIAGNOSIS — J019 Acute sinusitis, unspecified: Secondary | ICD-10-CM | POA: Diagnosis not present

## 2013-11-10 DIAGNOSIS — J3089 Other allergic rhinitis: Secondary | ICD-10-CM | POA: Diagnosis not present

## 2013-11-10 DIAGNOSIS — H1045 Other chronic allergic conjunctivitis: Secondary | ICD-10-CM | POA: Diagnosis not present

## 2013-11-10 DIAGNOSIS — J3081 Allergic rhinitis due to animal (cat) (dog) hair and dander: Secondary | ICD-10-CM | POA: Diagnosis not present

## 2013-11-19 DIAGNOSIS — J309 Allergic rhinitis, unspecified: Secondary | ICD-10-CM | POA: Diagnosis not present

## 2013-12-24 DIAGNOSIS — J309 Allergic rhinitis, unspecified: Secondary | ICD-10-CM | POA: Diagnosis not present

## 2013-12-31 DIAGNOSIS — J309 Allergic rhinitis, unspecified: Secondary | ICD-10-CM | POA: Diagnosis not present

## 2014-01-07 DIAGNOSIS — J309 Allergic rhinitis, unspecified: Secondary | ICD-10-CM | POA: Diagnosis not present

## 2014-01-21 DIAGNOSIS — J309 Allergic rhinitis, unspecified: Secondary | ICD-10-CM | POA: Diagnosis not present

## 2014-01-28 DIAGNOSIS — J309 Allergic rhinitis, unspecified: Secondary | ICD-10-CM | POA: Diagnosis not present

## 2014-03-04 DIAGNOSIS — J309 Allergic rhinitis, unspecified: Secondary | ICD-10-CM | POA: Diagnosis not present

## 2014-03-11 DIAGNOSIS — J309 Allergic rhinitis, unspecified: Secondary | ICD-10-CM | POA: Diagnosis not present

## 2014-03-14 ENCOUNTER — Encounter: Payer: Self-pay | Admitting: Internal Medicine

## 2014-03-14 ENCOUNTER — Ambulatory Visit (INDEPENDENT_AMBULATORY_CARE_PROVIDER_SITE_OTHER): Payer: Medicare Other | Admitting: Internal Medicine

## 2014-03-14 VITALS — BP 122/84 | HR 76 | Temp 98.4°F | Ht 65.0 in | Wt 160.0 lb

## 2014-03-14 DIAGNOSIS — E876 Hypokalemia: Secondary | ICD-10-CM

## 2014-03-14 DIAGNOSIS — I10 Essential (primary) hypertension: Secondary | ICD-10-CM

## 2014-03-14 DIAGNOSIS — J45909 Unspecified asthma, uncomplicated: Secondary | ICD-10-CM | POA: Diagnosis not present

## 2014-03-14 DIAGNOSIS — E1169 Type 2 diabetes mellitus with other specified complication: Principal | ICD-10-CM

## 2014-03-14 DIAGNOSIS — E1165 Type 2 diabetes mellitus with hyperglycemia: Secondary | ICD-10-CM | POA: Diagnosis not present

## 2014-03-14 DIAGNOSIS — J309 Allergic rhinitis, unspecified: Secondary | ICD-10-CM | POA: Diagnosis not present

## 2014-03-14 DIAGNOSIS — IMO0002 Reserved for concepts with insufficient information to code with codable children: Secondary | ICD-10-CM

## 2014-03-14 MED ORDER — LORATADINE 10 MG PO TABS: 10.0000 mg | ORAL_TABLET | Freq: Two times a day (BID) | ORAL | Status: DC | PRN

## 2014-03-14 MED ORDER — CANAGLIFLOZIN-METFORMIN HCL 50-1000 MG PO TABS: 1.0000 | ORAL_TABLET | Freq: Every day | ORAL | Status: DC

## 2014-03-14 NOTE — Progress Notes (Signed)
Pre visit review using our clinic review tool, if applicable. No additional management support is needed unless otherwise documented below in the visit note. 

## 2014-03-14 NOTE — Patient Instructions (Signed)
One a day

## 2014-03-14 NOTE — Progress Notes (Signed)
   Subjective:    Patient ID: Pam Obrien, female    DOB: 04/08/1946, 68 y.o.   MRN: [id number]  HPI CBGs stable A1C measurements Has been out of DM medication Jentadueto... Not taking Last a1c was 8.8 Does not have a good understanding of disease state    Review of Systems  Constitutional: Negative for activity change, appetite change and fatigue.  HENT: Positive for dental problem. Negative for congestion, ear pain, postnasal drip and sinus pressure.   Eyes: Negative for redness and visual disturbance.  Respiratory: Negative for cough, shortness of breath and wheezing.   Gastrointestinal: Negative for abdominal pain and abdominal distention.  Genitourinary: Negative for dysuria, frequency and menstrual problem.  Musculoskeletal: Negative for arthralgias, joint swelling, myalgias and neck pain.  Skin: Negative for rash and wound.  Neurological: Negative for dizziness, weakness and headaches.  Hematological: Negative for adenopathy. Does not bruise/bleed easily.  Psychiatric/Behavioral: Negative for sleep disturbance and decreased concentration.       Objective:   Physical Exam  Constitutional: She is oriented to person, place, and time. She appears well-developed and well-nourished. No distress.  HENT:  Head: Normocephalic and atraumatic.  Right Ear: External ear normal.  Left Ear: External ear normal.  Nose: Nose normal.  Mouth/Throat: Oropharynx is clear and moist.  Eyes: Conjunctivae and EOM are normal. Pupils are equal, round, and reactive to light.  Neck: Normal range of motion. Neck supple. No JVD present. No tracheal deviation present. No thyromegaly present.  Cardiovascular: Normal rate, regular rhythm and intact distal pulses.   Murmur heard. Pulmonary/Chest: Effort normal and breath sounds normal. She has no wheezes. She exhibits no tenderness.  Abdominal: Soft. Bowel sounds are normal.  Musculoskeletal: Normal range of motion. She exhibits no edema and no  tenderness.  Lymphadenopathy:    She has no cervical adenopathy.  Neurological: She is alert and oriented to person, place, and time. She has normal reflexes. No cranial nerve deficit.  Skin: Skin is warm and dry. She is not diaphoretic.  Psychiatric: She has a normal mood and affect. Her behavior is normal.          Assessment & Plan:  Trial of invokamet 50/1000 one a day in the AM Follow up in 2 months with blood work and see Padonda for DM management and yearly CPX discussion of need to stay on medications nitis and May have two claritin a day as needed for severe rhinitis

## 2014-03-15 ENCOUNTER — Telehealth: Payer: Self-pay | Admitting: Internal Medicine

## 2014-03-15 NOTE — Telephone Encounter (Signed)
Relevant patient education mailed to patient.  

## 2014-03-22 ENCOUNTER — Telehealth: Payer: Self-pay

## 2014-03-22 NOTE — Telephone Encounter (Signed)
Relevant patient education mailed to patient.  

## 2014-03-25 DIAGNOSIS — J309 Allergic rhinitis, unspecified: Secondary | ICD-10-CM | POA: Diagnosis not present

## 2014-04-01 DIAGNOSIS — J309 Allergic rhinitis, unspecified: Secondary | ICD-10-CM | POA: Diagnosis not present

## 2014-04-13 ENCOUNTER — Other Ambulatory Visit: Payer: Self-pay | Admitting: Internal Medicine

## 2014-04-26 DIAGNOSIS — J309 Allergic rhinitis, unspecified: Secondary | ICD-10-CM | POA: Diagnosis not present

## 2014-05-06 DIAGNOSIS — J309 Allergic rhinitis, unspecified: Secondary | ICD-10-CM | POA: Diagnosis not present

## 2014-05-16 ENCOUNTER — Ambulatory Visit (INDEPENDENT_AMBULATORY_CARE_PROVIDER_SITE_OTHER): Payer: Medicare Other | Admitting: Family

## 2014-05-16 ENCOUNTER — Encounter: Payer: Self-pay | Admitting: Family

## 2014-05-16 VITALS — BP 154/90 | HR 78 | Temp 98.4°F | Ht 65.0 in | Wt 162.0 lb

## 2014-05-16 DIAGNOSIS — E119 Type 2 diabetes mellitus without complications: Secondary | ICD-10-CM

## 2014-05-16 DIAGNOSIS — J309 Allergic rhinitis, unspecified: Secondary | ICD-10-CM | POA: Diagnosis not present

## 2014-05-16 DIAGNOSIS — I10 Essential (primary) hypertension: Secondary | ICD-10-CM

## 2014-05-16 LAB — HEMOGLOBIN A1C: Hgb A1c MFr Bld: 11.9 % — ABNORMAL HIGH (ref 4.6–6.5)

## 2014-05-16 MED ORDER — GLYBURIDE 5 MG PO TABS: 5.0000 mg | ORAL_TABLET | Freq: Every day | ORAL | Status: DC

## 2014-05-16 NOTE — Progress Notes (Signed)
Pre visit review using our clinic review tool, if applicable. No additional management support is needed unless otherwise documented below in the visit note. 

## 2014-05-16 NOTE — Patient Instructions (Signed)
Type 2 Diabetes Mellitus, Adult Type 2 diabetes mellitus, often simply referred to as type 2 diabetes, is a long-lasting (chronic) disease. In type 2 diabetes, the pancreas does not make enough insulin (a hormone), the cells are less responsive to the insulin that is made (insulin resistance), or both. Normally, insulin moves sugars from food into the tissue cells. The tissue cells use the sugars for energy. The lack of insulin or the lack of normal response to insulin causes excess sugars to build up in the blood instead of going into the tissue cells. As a result, high blood sugar (hyperglycemia) develops. The effect of high sugar (glucose) levels can cause many complications. Type 2 diabetes was also previously called adult-onset diabetes but it can occur at any age.  RISK FACTORS  A person is predisposed to developing type 2 diabetes if someone in the family has the disease and also has one or more of the following primary risk factors:  Overweight.  An inactive lifestyle.  A history of consistently eating high-calorie foods. Maintaining a normal weight and regular physical activity can reduce the chance of developing type 2 diabetes. SYMPTOMS  A person with type 2 diabetes may not show symptoms initially. The symptoms of type 2 diabetes appear slowly. The symptoms include:  Increased thirst (polydipsia).  Increased urination (polyuria).  Increased urination during the night (nocturia).  Weight loss. This weight loss may be rapid.  Frequent, recurring infections.  Tiredness (fatigue).  Weakness.  Vision changes, such as blurred vision.  Fruity smell to your breath.  Abdominal pain.  Nausea or vomiting.  Cuts or bruises which are slow to heal.  Tingling or numbness in the hands or feet. DIAGNOSIS Type 2 diabetes is frequently not diagnosed until complications of diabetes are present. Type 2 diabetes is diagnosed when symptoms or complications are present and when blood  glucose levels are increased. Your blood glucose level may be checked by one or more of the following blood tests:  A fasting blood glucose test. You will not be allowed to eat for at least 8 hours before a blood sample is taken.  A random blood glucose test. Your blood glucose is checked at any time of the day regardless of when you ate.  A hemoglobin A1c blood glucose test. A hemoglobin A1c test provides information about blood glucose control over the previous 3 months.  An oral glucose tolerance test (OGTT). Your blood glucose is measured after you have not eaten (fasted) for 2 hours and then after you drink a glucose-containing beverage. TREATMENT   You may need to take insulin or diabetes medicine daily to keep blood glucose levels in the desired range.  If you use insulin, you may need to adjust the dosage depending on the carbohydrates that you eat with each meal or snack. The treatment goal is to maintain the before meal blood sugar (preprandial glucose) level at 70-130 mg/dL. HOME CARE INSTRUCTIONS   Have your hemoglobin A1c level checked twice a year.  Perform daily blood glucose monitoring as directed by your health care provider.  Monitor urine ketones when you are ill and as directed by your health care provider.  Take your diabetes medicine or insulin as directed by your health care provider to maintain your blood glucose levels in the desired range.  Never run out of diabetes medicine or insulin. It is needed every day.  If you are using insulin, you may need to adjust the amount of insulin given based on your intake   of carbohydrates. Carbohydrates can raise blood glucose levels but need to be included in your diet. Carbohydrates provide vitamins, minerals, and fiber which are an essential part of a healthy diet. Carbohydrates are found in fruits, vegetables, whole grains, dairy products, legumes, and foods containing added sugars.  Eat healthy foods. You should make an  appointment to see a registered dietitian to help you create an eating plan that is right for you.  Lose weight if overweight.  Carry a medical alert card or wear your medical alert jewelry.  Carry a 15 gram carbohydrate snack with you at all times to treat low blood glucose (hypoglycemia). Some examples of 15 gram carbohydrate snacks include:  Glucose tablets, 3 or 4  Raisins, 2 tablespoons (24 grams)  Jelly beans, 6  Animal crackers, 8  Regular pop, 4 ounces (120 mL)  Gummy treats, 9  Recognize hypoglycemia. Hypoglycemia occurs with blood glucose levels of 70 mg/dL and below. The risk for hypoglycemia increases when fasting or skipping meals, during or after intense exercise, and during sleep. Hypoglycemia symptoms can include:  Tremors or shakes.  Decreased ability to concentrate.  Sweating.  Increased heart rate.  Headache.  Dry mouth.  Hunger.  Irritability.  Anxiety.  Restless sleep.  Altered speech or coordination.  Confusion.  Treat hypoglycemia promptly. If you are alert and able to safely swallow, follow the 15:15 rule:  Take 15-20 grams of rapid-acting glucose or carbohydrate. Rapid-acting options include glucose gel, glucose tablets, or 4 ounces (120 mL) of fruit juice, regular soda, or low fat milk.  Check your blood glucose level 15 minutes after taking the glucose.  Take 15-20 grams more of glucose if the repeat blood glucose level is still 70 mg/dL or below.  Eat a meal or snack within 1 hour once blood glucose levels return to normal.  Be alert to feeling very thirsty and urinating more frequently than usual, which are early signs of hyperglycemia. An early awareness of hyperglycemia allows for prompt treatment. Treat hyperglycemia as directed by your health care provider.  Engage in at least 150 minutes of moderate-intensity physical activity a week, spread over at least 3 days of the week or as directed by your health care provider. In  addition, you should engage in resistance exercise at least 2 times a week or as directed by your health care provider.  Adjust your medicine and food intake as needed if you start a new exercise or sport.  Follow your sick day plan at any time you are unable to eat or drink as usual.  Avoid tobacco use.  Limit alcohol intake to no more than 1 drink per day for nonpregnant women and 2 drinks per day for men. You should drink alcohol only when you are also eating food. Talk with your health care provider whether alcohol is safe for you. Tell your health care provider if you drink alcohol several times a week.  Follow up with your health care provider regularly.  Schedule an eye exam soon after the diagnosis of type 2 diabetes and then annually.  Perform daily skin and foot care. Examine your skin and feet daily for cuts, bruises, redness, nail problems, bleeding, blisters, or sores. A foot exam by a health care provider should be done annually.  Brush your teeth and gums at least twice a day and floss at least once a day. Follow up with your dentist regularly.  Share your diabetes management plan with your workplace or school.  Stay up-to-date with   immunizations.  Learn to manage stress.  Obtain ongoing diabetes education and support as needed.  Participate in, or seek rehabilitation as needed to maintain or improve independence and quality of life. Request a physical or occupational therapy referral if you are having foot or hand numbness or difficulties with grooming, dressing, eating, or physical activity. SEEK MEDICAL CARE IF:   You are unable to eat food or drink fluids for more than 6 hours.  You have nausea and vomiting for more than 6 hours.  Your blood glucose level is over 240 mg/dL.  There is a change in mental status.  You develop an additional serious illness.  You have diarrhea for more than 6 hours.  You have been sick or have had a fever for a couple of days  and are not getting better.  You have pain during any physical activity.  SEEK IMMEDIATE MEDICAL CARE IF:  You have difficulty breathing.  You have moderate to large ketone levels. MAKE SURE YOU:  Understand these instructions.  Will watch your condition.  Will get help right away if you are not doing well or get worse. Document Released: 10/28/2005 Document Revised: 11/02/2013 Document Reviewed: 05/26/2012 [employer] Patient Information 2015 [employer]. This information is not intended to replace advice given to you by your health care provider. Make sure you discuss any questions you have with your health care provider.  

## 2014-05-16 NOTE — Progress Notes (Signed)
Subjective:    Patient ID: Pam Obrien, female    DOB: [birth year], 68 y.o.   MRN: [id number]   HPI 68 y.o. AA female presents today for diabetes follow up after being started on combination of Invokana and metformin. Pt states that she took the medication for a month but was having abdominal pain, fullness and nausea so she stopped taking the medication. Pt also states that she recently saw a commercial of the medication that stated it was deadly and she refuses to take it. Denies dizziness, fatigue, polyuria, polydipsia. Denies fever, chills, fatigue and change in appetite.     Review of Systems  Constitutional: Negative.   HENT: Negative.   Eyes: Negative.   Respiratory: Negative.   Cardiovascular: Negative.   Gastrointestinal: Positive for nausea, abdominal pain and abdominal distention.       Acknowledges abdomina pain, nausea and fullness only when taking invokana/metformin  Endocrine: Negative.   Musculoskeletal: Negative.   Skin: Negative.   Allergic/Immunologic: Positive for food allergies.  Neurological: Negative.   Hematological: Negative.   Psychiatric/Behavioral: Negative.    Past Medical History  Diagnosis Date  . Allergy   . Hyperlipidemia   . Arthritis   . Diabetes mellitus without complication   . Hypertension     History   Social History  . Marital Status: Married    Spouse Name: N/A    Number of Children: N/A  . Years of Education: N/A   Occupational History  . Not on file.   Social History Main Topics  . Smoking status: Never Smoker   . Smokeless tobacco: Never Used  . Alcohol Use: No  . Drug Use: No  . Sexual Activity: Not on file   Other Topics Concern  . Not on file   Social History Narrative  . No narrative on file    Past Surgical History  Procedure Laterality Date  . Colonoscopy  2008  . Flexible sigmoidoscopy  2003  . Abdominal hysterectomy      Family History  Problem Relation Age of Onset  . Hypertension    .  Liver cancer    . Stroke      Allergies  Allergen Reactions  . Aspirin     REACTION: nervous  . Beef-Derived Products   . Celecoxib     REACTION: joints swell  . Milk-Related Compounds   . Mold Extract [Trichophyton]   . Nsaids     REACTION: sweling  . Peanuts [Peanut Oil]   . Shellfish Allergy   . Sulfamethoxazole     REACTION: urticaria (hives)    Current Outpatient Prescriptions on File Prior to Visit  Medication Sig Dispense Refill  . atenolol-chlorthalidone (TENORETIC) 50-25 MG per tablet TAKE 1 TABLET BY MOUTH EVERY DAY  90 tablet  2  . calcium citrate-vitamin D (CITRACAL+D) 315-200 MG-UNIT per tablet Take 1 tablet by mouth daily.      . cholecalciferol (VITAMIN D) 1000 UNITS tablet Take 2,000 Units by mouth daily.      Marland Kitchen FeFum-FePo-FA-B Cmp-C-Zn-Mn-Cu (SE-TAN PLUS) 162-115.2-1 MG CAPS TAKE 162 MG BY MOUTH DAILY.  90 capsule  3  . loratadine (CLARITIN) 10 MG tablet Take 1 tablet (10 mg total) by mouth 2 (two) times daily as needed for allergies.  180 tablet  3  . potassium chloride SA (K-DUR,KLOR-CON) 20 MEQ tablet 1 qd   90 tablet  3   No current facility-administered medications on file prior to visit.    BP 154/90  Pulse  78  Temp(Src) 98.4 F (36.9 C) (Oral)  Ht 5\' 5"  (1.651 m)  Wt 162 lb (73.483 kg)  BMI 26.96 kg/m2  SpO2 94%chart    Objective:   Physical Exam  Constitutional: She is oriented to person, place, and time. She appears well-developed and well-nourished. She is active.  Cardiovascular: Normal rate, regular rhythm, normal heart sounds and normal pulses.   Pulmonary/Chest: Effort normal and breath sounds normal.  Abdominal: Soft. Normal appearance and bowel sounds are normal.  Neurological: She is alert and oriented to person, place, and time.  Skin: Skin is warm, dry and intact.  Psychiatric: She has a normal mood and affect. Her speech is normal and behavior is normal. Thought content normal.          Assessment & Plan:  68 y.o. AA  female presents for medication follow up for type 2 diabetes.  - Type 2 Diabetes, uncontrolled  - Start Glyburide 5mg  po   - D/C Invokana/metformin  - Hemoglobin A1C  - Refer to Endocrinology  Hypertension- Continue current plan.   Education: Nutrition counseling including eating fruits and vegetables, avoiding high fat foods and foods rich in simple carbohydrates. START EXERCISING at least 30 minutes per day 5x per week.   Follow up: With endocrinology.

## 2014-05-19 ENCOUNTER — Other Ambulatory Visit: Payer: Self-pay | Admitting: Internal Medicine

## 2014-05-27 ENCOUNTER — Encounter: Payer: Self-pay | Admitting: Endocrinology

## 2014-05-27 ENCOUNTER — Ambulatory Visit (INDEPENDENT_AMBULATORY_CARE_PROVIDER_SITE_OTHER): Payer: Medicare Other | Admitting: Endocrinology

## 2014-05-27 ENCOUNTER — Other Ambulatory Visit: Payer: Self-pay | Admitting: *Deleted

## 2014-05-27 VITALS — BP 139/88 | HR 77 | Temp 98.3°F | Resp 16 | Ht 65.0 in | Wt 163.2 lb

## 2014-05-27 DIAGNOSIS — E1165 Type 2 diabetes mellitus with hyperglycemia: Principal | ICD-10-CM

## 2014-05-27 DIAGNOSIS — IMO0001 Reserved for inherently not codable concepts without codable children: Secondary | ICD-10-CM | POA: Diagnosis not present

## 2014-05-27 DIAGNOSIS — I10 Essential (primary) hypertension: Secondary | ICD-10-CM

## 2014-05-27 LAB — URINALYSIS, ROUTINE W REFLEX MICROSCOPIC
Bilirubin Urine: NEGATIVE
Hgb urine dipstick: NEGATIVE
Ketones, ur: NEGATIVE
Leukocytes, UA: NEGATIVE
Nitrite: NEGATIVE
Specific Gravity, Urine: 1.02 (ref 1.000–1.030)
Total Protein, Urine: NEGATIVE
Urine Glucose: NEGATIVE
Urobilinogen, UA: 0.2 (ref 0.0–1.0)
pH: 6 (ref 5.0–8.0)

## 2014-05-27 MED ORDER — CANAGLIFLOZIN 100 MG PO TABS: 100.0000 mg | ORAL_TABLET | Freq: Every day | ORAL | Status: DC

## 2014-05-27 MED ORDER — SITAGLIPTIN PHOSPHATE 100 MG PO TABS: 100.0000 mg | ORAL_TABLET | Freq: Every day | ORAL | Status: DC

## 2014-05-27 MED ORDER — GLUCOSE BLOOD VI STRP: ORAL_STRIP | Status: DC

## 2014-05-27 MED ORDER — ACCU-CHEK SOFTCLIX LANCETS MISC: Status: DC

## 2014-05-27 NOTE — Patient Instructions (Addendum)
Januvia 1 pill daily  Invokana 100mg  tab, 1/2 pill in am and after 1 week take full tab daily  Gyburide 1 pill twice daily, call when  This is finished  Walk daily Avoid large amounts of fruits especially sweet Review Diet instructions  Previously given

## 2014-05-27 NOTE — Progress Notes (Signed)
Patient ID: Pam Obrien, female   DOB: 1946/10/24, 68 y.o.   MRN: [id number]    Reason for Appointment: Consultation for Type 2 Diabetes  Referring physician:   Megan Salon  History of Present Illness:          Diagnosis: Type 2 diabetes mellitus, date of diagnosis:  4/13      Past history:  She was initially started on metformin but he thinks that this made her sleepy and had some abdominal discomfort   She also has been tried on other oral hypoglycemic regimens including Jentadueto  since 2014  But she had similar side effects with this  And not clear how regularly she had been taking this until it was discontinued in 5/15  A1c appears to be persistently over 8% Since 2014  Recent history:   She was tried on Invokamet recently  But she had excessive urination with this and also some nausea and weakness   she was then started on glyburide  Instead and referred here for further management. She has had no side effects with this but has not checked her blood sugar at home. With this her blood sugar today is still significantly high in the office  She has not complaining of excessive thirst or urination recently but has some fatigue   Because of her markedly increased A1c she is not referred him for further management  Oral hypoglycemic drugs the patient is taking are: glyburide      Side effects from medications have been: Metformin  causes Sleepiness and abdominal discomfort Compliance with the medical regimen: fair Glucose monitoring: none , has not  Been given a glucose meter recently        Blood Glucose readings none    Glycemic control:  Lab Results  Component Value Date   HGBA1C 11.9* 05/16/2014   HGBA1C 8.8* 10/13/2013   HGBA1C 8.0* 04/12/2013   Lab Results  Component Value Date   LDLCALC 109* 04/12/2013   CREATININE 0.7 10/13/2013    Self-care: The diet that the patient has been following is: tries to limit  portions      Meals: small meals 3-6 x per day. Usually has  oatmeal in the morning , sometimes will have only fruit at lunchtime , may have rice or other carbohydrates at dinner time , will have fruit for snacks          Exercise: none , not motivated         Dietician visit: Most recent:3/14.               Retinal exam: Most recent:2014  With the optometrist.    Weight history:  Previous range 160-172   Wt Readings from Last 3 Encounters:  05/27/14 163 lb 3.2 oz (74.027 kg)  05/16/14 162 lb (73.483 kg)  03/14/14 160 lb (72.576 kg)      Medication List       This list is accurate as of: 05/27/14  2:23 PM.  Always use your most recent med list.               atenolol-chlorthalidone 50-25 MG per tablet  Commonly known as:  TENORETIC  TAKE 1 TABLET BY MOUTH EVERY DAY     calcium citrate-vitamin D 315-200 MG-UNIT per tablet  Commonly known as:  CITRACAL+D  Take 1 tablet by mouth daily.     cholecalciferol 1000 UNITS tablet  Commonly known as:  VITAMIN D  Take 2,000 Units by mouth daily.  glyBURIDE 5 MG tablet  Commonly known as:  DIABETA  Take 1 tablet (5 mg total) by mouth daily with breakfast.     loratadine 10 MG tablet  Commonly known as:  CLARITIN  Take 1 tablet (10 mg total) by mouth 2 (two) times daily as needed for allergies.     omeprazole 20 MG capsule  Commonly known as:  PRILOSEC  Take 20 mg by mouth daily.     potassium chloride SA 20 MEQ tablet  Commonly known as:  K-DUR,KLOR-CON  - 1 qd  -      SE-TAN PLUS 162-115.2-1 MG Caps  TAKE ONE CAPSULE BY MOUTH EVERY DAY        Allergies:  Allergies  Allergen Reactions  . Aspirin     REACTION: nervous  . Beef-Derived Products   . Celecoxib     REACTION: joints swell  . Milk-Related Compounds   . Mold Extract [Trichophyton]   . Nsaids     REACTION: sweling  . Peanuts [Peanut Oil]   . Shellfish Allergy   . Sulfamethoxazole     REACTION: urticaria (hives)    Past Medical History  Diagnosis Date  . Allergy   . Hyperlipidemia   . Arthritis   .  Diabetes mellitus without complication   . Hypertension     Past Surgical History  Procedure Laterality Date  . Colonoscopy  2008  . Flexible sigmoidoscopy  2003  . Abdominal hysterectomy      Family History  Problem Relation Age of Onset  . Hypertension    . Liver cancer    . Stroke    . Diabetes Father   . Hypertension Father   . Heart disease Neg Hx     Social History:  reports that she has never smoked. She has never used smokeless tobacco. She reports that she does not drink alcohol or use illicit drugs.    Review of Systems       Lipids:  Has not been on medications for this and no recent levels available       Lab Results  Component Value Date   CHOL 191 04/12/2013   HDL 56.90 04/12/2013   LDLCALC 109* 04/12/2013   LDLDIRECT 121.7 03/04/2012   TRIG 124.0 04/12/2013   CHOLHDL 3 04/12/2013        No unusual headaches except with allergies.      No visual difficulties or problems,  She had eye exam in 2014                  Skin: No rash or infections     Thyroid:   Has  Periodic fatigue.     The blood pressure has been  Treated  With medications for several years       She has had swelling of feet , none recently.     No shortness of breath on exertion.     Bowel habits: Normal.       No frequency of urination or  dysuria      No joint  pains.         No history of Numbness, tingling or burning in feet     LABS:  No visits with results within 1 Week(s) from this visit. Latest known visit with results is:  Office Visit on 05/16/2014  Component Date Value Ref Range Status  . Hemoglobin A1C 05/16/2014 11.9* 4.6 - 6.5 % Final   Glycemic Control Guidelines for People with Diabetes:Non Diabetic:  <  6%Goal of Therapy: <7%Additional Action Suggested:  >8%     Physical Examination:  BP 139/88  Pulse 77  Temp(Src) 98.3 F (36.8 C)  Resp 16  Ht 5\' 5"  (1.651 m)  Wt 163 lb 3.2 oz (74.027 kg)  BMI 27.16 kg/m2  SpO2 97%  GENERAL:         Patient has   Moderate generalized obesity.   HEENT:         Eye exam shows normal external appearance. Fundus exam shows no retinopathy.   Oral exam shows relatively dry tongue and mucosa .  NECK:         General:  Neck exam shows no lymphadenopathy. Carotids are normal to palpation and no bruit heard.  Thyroid is not enlarged and no nodules felt.   LUNGS:         Chest is symmetrical. Lungs are clear to auscultation.Marland Kitchen   HEART:         Heart sounds:  S1 and S2 are normal. No murmurs or clicks heard., no S3 or S4.   ABDOMEN:   There is no distention present. Liver and spleen are not palpable. No other mass or tenderness present.  EXTREMITIES:     There is no edema. No skin lesions present.Marland Kitchen  NEUROLOGICAL:   Vibration sense is mildly reduced in toes. Ankle jerks are absent bilaterally.          Diabetic foot exam: Diabetic foot exam shows normal monofilament sensation in the toes and plantar surfaces, no skin lesions or ulcers on the feet and normal pedal pulses MUSCULOSKELETAL:       There is no enlargement or deformity of the joints. Spine is normal to inspection.Marland Kitchen   SKIN:       No rash or lesions of concern.        ASSESSMENT:  Diabetes type 2, uncontrolled with A1c nearly 12% Most likely she has had progression of her diabetes and has become insulin deficient Has had nonspecific side effects from metformin and does not want to try this again Also is very reluctant to start any injectable drugs even though she was  Explained that she may need a GLP-1 drug or insulin for control  She has significant hyperglycemia despite eating relatively small portions and is only mildly obese   Her glucose in the office today is over 200  fasting Will need to try her on a combination of 3 oral drugs before going to GLP-1 drugs or insulin  Complications: None evident    Hypertension : this has been for several years , blood pressure is high today   Lipids: needs followup evaluation, previously normal in 2014 without  medications  PLAN:   She will be given a trial of Invokana without metformin, starting with half a tablet of 100 mg and then 100 mg to avoid side effects like polyuria or nocturia which she was having previously. Discussed actions of  Invokana and benefits as well as possible side effects; have given brochure  She will also try Januvia which should be well tolerated starting at 100 mg daily, discussed mechanism of action and given patient information packet on this  Since she just had a prescription filled for glyburide she can continue this but use 10 mg a day She was instructed on how to check her sugar with an Accu-Chek Aviva plus meter. Discussed checking blood sugars at least once a day at various times including postprandially. Discussed blood sugar targets and normal blood sugars Encouraged  her to start walking daily for exercise She will review the materials given to her by diabetes educator in 2014 Check urinalysis today to rule out ketonuria Followup in 3 weeks  Patient Instructions  Januvia 1 pill daily  Invokana 100mg  tab, 1/2 pill in am and after 1 week take full tab daily  Gyburide 1 pill twice daily, call when  This is finished  Walk daily Avoid large amounts of fruits especially sweet Review Diet instructions  Previously given    total visit time including counseling = 60 minutes  Raynaldo Falco 05/27/2014, 2:23 PM   Note: This office note was prepared with Dragon voice recognition system technology. Any transcriptional errors that result from this process are unintentional.

## 2014-06-01 ENCOUNTER — Other Ambulatory Visit: Payer: Self-pay | Admitting: Endocrinology

## 2014-06-01 ENCOUNTER — Telehealth: Payer: Self-pay | Admitting: Endocrinology

## 2014-06-01 DIAGNOSIS — E1165 Type 2 diabetes mellitus with hyperglycemia: Principal | ICD-10-CM

## 2014-06-01 DIAGNOSIS — IMO0001 Reserved for inherently not codable concepts without codable children: Secondary | ICD-10-CM

## 2014-06-01 NOTE — Telephone Encounter (Signed)
Need to know what her blood sugars are before starting insulin

## 2014-06-01 NOTE — Telephone Encounter (Signed)
Pt states invokana and janumet will not be covered by her insurance please advise  Pharmacy gave pt the info for the assistance programs

## 2014-06-01 NOTE — Telephone Encounter (Signed)
Please see below and advise.

## 2014-06-01 NOTE — Telephone Encounter (Signed)
She is scheduled to see Vaughan Basta on Tuesday.

## 2014-06-01 NOTE — Telephone Encounter (Signed)
We will have to start her on insulin which will be covered by insurance. We don't have any other drugs that will work. She can be scheduled to see Vaughan Basta to start insulin on Monday

## 2014-06-02 NOTE — Telephone Encounter (Signed)
Patient is aware and will bring in the readings when she comes in Tuesday to see [facility].

## 2014-06-07 ENCOUNTER — Encounter: Payer: Medicare Other | Attending: Endocrinology | Admitting: Nutrition

## 2014-06-07 DIAGNOSIS — IMO0001 Reserved for inherently not codable concepts without codable children: Secondary | ICD-10-CM | POA: Insufficient documentation

## 2014-06-07 DIAGNOSIS — Z713 Dietary counseling and surveillance: Secondary | ICD-10-CM | POA: Diagnosis not present

## 2014-06-07 DIAGNOSIS — Z794 Long term (current) use of insulin: Secondary | ICD-10-CM | POA: Insufficient documentation

## 2014-06-07 DIAGNOSIS — E1165 Type 2 diabetes mellitus with hyperglycemia: Principal | ICD-10-CM

## 2014-06-07 NOTE — Patient Instructions (Signed)
Take 15u of lantus insulin every morning Test blood sugars before before breakfast every day, and again one other time--either before meals or 2 hours after the meal.   Read over literature given on Lantus insulin and call if questions.

## 2014-06-07 NOTE — Progress Notes (Signed)
Patient brought her meter in, and she is testing once a day since seeing Dr. Dwyane Dee.  FBS today was 197, yesterday it was 244 3hr. PcS, 202 fasting on 7/26, and 235 acL on 7/25.   Per Dr. Arman Filter voice order, she was started on Lantus, 15u once a day.  This voice order was repeated to her with a verbal ok.  She took her first injection while here in the office of 10u (due to timing--2:30 PM.  She will take 15u q AM starting tomorrow.   We reviewed low blood sugars--symptoms and treatments and she was given glucose tablets for this.  We reviewed other options she can use to treat low blood sugars.  She reported good understanding of this.  She was also given a brochure on "Starting Lantus" with how to use the pen, and how there insulin works.   She was told to test her blood sugars twice a day--fasting and one other time--either before a meal or two hours after the meal.  She reported good understanding of this and had no final questions.

## 2014-06-10 ENCOUNTER — Other Ambulatory Visit: Payer: Self-pay | Admitting: *Deleted

## 2014-06-10 ENCOUNTER — Telehealth: Payer: Self-pay | Admitting: Endocrinology

## 2014-06-10 ENCOUNTER — Other Ambulatory Visit: Payer: Self-pay | Admitting: Internal Medicine

## 2014-06-10 DIAGNOSIS — J309 Allergic rhinitis, unspecified: Secondary | ICD-10-CM | POA: Diagnosis not present

## 2014-06-10 MED ORDER — GLIMEPIRIDE 4 MG PO TABS: ORAL_TABLET | ORAL | Status: DC

## 2014-06-10 NOTE — Telephone Encounter (Signed)
Patient need refill on med Glyburide 5 mg

## 2014-06-10 NOTE — Telephone Encounter (Signed)
Amaryl 4mg  at supper daily, cut in 1/2 if sugar <80

## 2014-06-10 NOTE — Telephone Encounter (Signed)
Patient is requesting a refill of glyburide, in your note you said for her to call when finished?  Okay to refill?

## 2014-06-17 DIAGNOSIS — J309 Allergic rhinitis, unspecified: Secondary | ICD-10-CM | POA: Diagnosis not present

## 2014-06-23 ENCOUNTER — Other Ambulatory Visit: Payer: Self-pay | Admitting: *Deleted

## 2014-06-23 ENCOUNTER — Encounter: Payer: Self-pay | Admitting: Endocrinology

## 2014-06-23 ENCOUNTER — Ambulatory Visit (INDEPENDENT_AMBULATORY_CARE_PROVIDER_SITE_OTHER): Payer: Medicare Other | Admitting: Endocrinology

## 2014-06-23 VITALS — BP 124/75 | HR 71 | Temp 98.3°F | Resp 16 | Ht 65.0 in | Wt 166.0 lb

## 2014-06-23 DIAGNOSIS — E1165 Type 2 diabetes mellitus with hyperglycemia: Principal | ICD-10-CM

## 2014-06-23 DIAGNOSIS — IMO0001 Reserved for inherently not codable concepts without codable children: Secondary | ICD-10-CM | POA: Diagnosis not present

## 2014-06-23 DIAGNOSIS — I10 Essential (primary) hypertension: Secondary | ICD-10-CM

## 2014-06-23 MED ORDER — GLIMEPIRIDE 4 MG PO TABS: 4.0000 mg | ORAL_TABLET | Freq: Every day | ORAL | Status: DC

## 2014-06-23 MED ORDER — INSULIN ASPART PROT & ASPART (70-30 MIX) 100 UNIT/ML PEN: 15.0000 [IU] | PEN_INJECTOR | Freq: Two times a day (BID) | SUBCUTANEOUS | Status: DC

## 2014-06-23 NOTE — Patient Instructions (Addendum)
Please check blood sugars at least half the time about 2 hours after any meal and every other day on waking up. Please bring blood sugar monitor to each visit  Change glyburide to glimepiride 4 mg in the morning  Start new insulin: 70/30, 12 units BEFORE breakfast and 8 units before supper. Try to take insulin 15-30 minutes before eating If the sugar in the afternoon is still over 180 increase the dose in the morning to 14 units  Start Invokana 100 mg daily in the morning before breakfast

## 2014-06-23 NOTE — Progress Notes (Signed)
Patient ID: Pam Obrien, female   DOB: 05/11/1946, 68 y.o.   MRN: [id number]    Reason for Appointment:  for Type 2 Diabetes  Referring physician:   Megan Salon  History of Present Illness:          Diagnosis: Type 2 diabetes mellitus, date of diagnosis:  4/13      Past history:  She was initially started on metformin but he thinks that this made her sleepy and had some abdominal discomfort   She also has been tried on other oral hypoglycemic regimens including Jentadueto  since 2014  But she had similar side effects with this  And not clear how regularly she had been taking this until it was discontinued in 5/15  A1c appears to be persistently over 8% Since 2014  Recent history:   She was tried on Invokamet by her PCP but because of excessive urination with this and also some nausea and weakness it was stopped With glyburide alone her blood sugars are poorly controlled and she was referred here for further management She was asked to try Januvia and Invokana but she did not do this because of expense of the medications and is only taking glyburide again as oral medication She was referred to the nurse educator and started on basal insulin to improve her control on 06/07/14 She has done fairly well with starting Lantus 15 units in the mornings She was also started on home glucose monitoring with an Accu-Chek However her blood sugars are recently been checked mostly in the morning and they appear to be still somewhat high overall Overall her blood sugars tend to be higher day and afternoon and some in the evening also  Oral hypoglycemic drugs the patient is taking are: glyburide    Insulin 15 units   Side effects from medications have been: Metformin  causes Sleepiness and abdominal discomfort Compliance with the medical regimen: fair Glucose monitoring with Accu-Chek meter:    PREMEAL Breakfast Lunch  3-5 PM  Bedtime Overall  Glucose range: 144-193  173   191-233   7-10 PM     Mean/median:  172     160-244   185   Glycemic control:  Lab Results  Component Value Date   HGBA1C 11.9* 05/16/2014   HGBA1C 8.8* 10/13/2013   HGBA1C 8.0* 04/12/2013   Lab Results  Component Value Date   LDLCALC 109* 04/12/2013   CREATININE 0.7 10/13/2013    Self-care: The diet that the patient has been following is: tries to limit  portions      Meals: small meals 3-6 x per day. Usually has oatmeal in the morning , sometimes will have only fruit at lunchtime , may have rice or other carbohydrates at dinner time, will have fruit for snacks          Exercise: none , not motivated         Dietician visit: Most recent:3/14.               Retinal exam: Most recent:2014  With the optometrist.    Weight history:  Previous range 160-172   Wt Readings from Last 3 Encounters:  06/23/14 166 lb (75.297 kg)  05/27/14 163 lb 3.2 oz (74.027 kg)  05/16/14 162 lb (73.483 kg)      Medication List       This list is accurate as of: 06/23/14  9:30 PM.  Always use your most recent med list.  ACCU-CHEK SOFTCLIX LANCETS lancets  Use as instructed to check blood sugar 1 time per day dx code 250.02     atenolol-chlorthalidone 50-25 MG per tablet  Commonly known as:  TENORETIC  TAKE 1 TABLET BY MOUTH EVERY DAY     calcium citrate-vitamin D 315-200 MG-UNIT per tablet  Commonly known as:  CITRACAL+D  Take 1 tablet by mouth daily.     Canagliflozin 100 MG Tabs  Commonly known as:  INVOKANA  Take 1 tablet (100 mg total) by mouth daily before breakfast.     cholecalciferol 1000 UNITS tablet  Commonly known as:  VITAMIN D  Take 2,000 Units by mouth daily.     glucose blood test strip  Commonly known as:  ACCU-CHEK AVIVA PLUS  Use as instructed to check blood sugar 1 time per day dx code 250.02     glyBURIDE 5 MG tablet  Commonly known as:  DIABETA  Take 1 tablet (5 mg total) by mouth daily with breakfast.     Insulin Aspart Prot & Aspart (70-30) 100 UNIT/ML Pen  Commonly  known as:  NOVOLOG MIX 70/30 FLEXPEN  Inject 15 Units into the skin 2 (two) times daily.     loratadine 10 MG tablet  Commonly known as:  CLARITIN  Take 1 tablet (10 mg total) by mouth 2 (two) times daily as needed for allergies.     omeprazole 20 MG capsule  Commonly known as:  PRILOSEC  Take 20 mg by mouth daily.     potassium chloride SA 20 MEQ tablet  Commonly known as:  K-DUR,KLOR-CON  - 1 qd  -      SE-TAN PLUS 162-115.2-1 MG Caps  TAKE ONE CAPSULE BY MOUTH EVERY DAY        Allergies:  Allergies  Allergen Reactions  . Aspirin     REACTION: nervous  . Beef-Derived Products   . Celecoxib     REACTION: joints swell  . Milk-Related Compounds   . Mold Extract [Trichophyton]   . Nsaids     REACTION: sweling  . Peanuts [Peanut Oil]   . Shellfish Allergy   . Sulfamethoxazole     REACTION: urticaria (hives)    Past Medical History  Diagnosis Date  . Allergy   . Hyperlipidemia   . Arthritis   . Diabetes mellitus without complication   . Hypertension     Past Surgical History  Procedure Laterality Date  . Colonoscopy  2008  . Flexible sigmoidoscopy  2003  . Abdominal hysterectomy      Family History  Problem Relation Age of Onset  . Hypertension    . Liver cancer    . Stroke    . Diabetes Father   . Hypertension Father   . Heart disease Neg Hx     Social History:  reports that she has never smoked. She has never used smokeless tobacco. She reports that she does not drink alcohol or use illicit drugs.    Review of Systems       Lipids:  Has not been on medications for this and no recent levels available       Lab Results  Component Value Date   CHOL 191 04/12/2013   HDL 56.90 04/12/2013   LDLCALC 109* 04/12/2013   LDLDIRECT 121.7 03/04/2012   TRIG 124.0 04/12/2013   CHOLHDL 3 04/12/2013    On treatment for hypertension    Physical Examination:  BP 124/75  Pulse 71  Temp(Src) 98.3 F (36.8 C)  Resp 16  Ht 5\' 5"  (1.651 m)  Wt 166 lb  (75.297 kg)  BMI 27.62 kg/m2  SpO2 97%  No ankle edema present         ASSESSMENT:  Diabetes type 2, uncontrolled with A1c nearly 12% In 7/15 She is having difficulties getting her prescriptions because of cost and hasn't taken her Invokana and Januvia as prescribed She thinks she has intolerance to metformin and does not want to try this She has however done well with starting Lantus insulin and is having no difficulties or objections to doing this Discussed blood sugar patterns the patient and indicated that she needs control of postprandial readings also, discussed actions of Lantus and mealtime insulins She does need better self-care with increased exercise and balanced meals   Lipids: needs followup evaluation   PLAN:  For convenience she can try starting with premixed insulin before breakfast and supper Advised her on increasing her morning dosage on her own if readings in the afternoon continue to be high She was given a co-pay card to get 30 day trial of Invokana, again discussed how this works and benefits and possible side effects She will also switch from glyburide to Amaryl 4 mg in the morning Needs to start regular walking Discussed needing to check blood sugars by rotation at various times including after meals   Patient Instructions  Please check blood sugars at least half the time about 2 hours after any meal and every other day on waking up. Please bring blood sugar monitor to each visit  Change glyburide to glimepiride 4 mg in the morning  Start new insulin: 70/30, 12 units BEFORE breakfast and 8 units before supper. Try to take insulin 15-30 minutes before eating If the sugar in the afternoon is still over 180 increase the dose in the morning to 14 units  Start Invokana 100 mg daily in the morning before breakfast    Counseling time over 50% of today's 25 minute visit    Adajah Cocking 06/23/2014, 9:30 PM   Note: This office note was prepared with [employer]. Any transcriptional errors that result from this process are unintentional.

## 2014-06-24 DIAGNOSIS — J309 Allergic rhinitis, unspecified: Secondary | ICD-10-CM | POA: Diagnosis not present

## 2014-06-28 ENCOUNTER — Telehealth: Payer: Self-pay | Admitting: Endocrinology

## 2014-06-28 NOTE — Telephone Encounter (Signed)
Please call in an alternative for the new pen pelase call pt for clarification

## 2014-06-28 NOTE — Telephone Encounter (Signed)
She will have to buy the Novolin 70/30 with a syringe

## 2014-06-28 NOTE — Telephone Encounter (Signed)
Noted, patient is aware, she is going to call her insurance company to see if she can get something else at a lower tier and call back to let me know

## 2014-06-28 NOTE — Telephone Encounter (Signed)
Please see below and advise Novolog 70/30 $485 for a 30 day supply, she did get invokana and glimepiride

## 2014-07-01 DIAGNOSIS — J309 Allergic rhinitis, unspecified: Secondary | ICD-10-CM | POA: Diagnosis not present

## 2014-07-07 DIAGNOSIS — J3081 Allergic rhinitis due to animal (cat) (dog) hair and dander: Secondary | ICD-10-CM | POA: Diagnosis not present

## 2014-07-07 DIAGNOSIS — H1045 Other chronic allergic conjunctivitis: Secondary | ICD-10-CM | POA: Diagnosis not present

## 2014-07-07 DIAGNOSIS — J301 Allergic rhinitis due to pollen: Secondary | ICD-10-CM | POA: Diagnosis not present

## 2014-07-07 DIAGNOSIS — J3089 Other allergic rhinitis: Secondary | ICD-10-CM | POA: Diagnosis not present

## 2014-07-11 ENCOUNTER — Telehealth: Payer: Self-pay | Admitting: Endocrinology

## 2014-07-11 NOTE — Telephone Encounter (Signed)
Patient is returning your call.  

## 2014-07-21 ENCOUNTER — Ambulatory Visit (INDEPENDENT_AMBULATORY_CARE_PROVIDER_SITE_OTHER): Payer: Medicare Other | Admitting: Endocrinology

## 2014-07-21 ENCOUNTER — Encounter: Payer: Self-pay | Admitting: Endocrinology

## 2014-07-21 VITALS — BP 118/82 | HR 87 | Temp 98.1°F | Resp 16 | Ht 65.0 in | Wt 159.8 lb

## 2014-07-21 DIAGNOSIS — E1165 Type 2 diabetes mellitus with hyperglycemia: Principal | ICD-10-CM

## 2014-07-21 DIAGNOSIS — IMO0001 Reserved for inherently not codable concepts without codable children: Secondary | ICD-10-CM

## 2014-07-21 DIAGNOSIS — E785 Hyperlipidemia, unspecified: Secondary | ICD-10-CM | POA: Diagnosis not present

## 2014-07-21 MED ORDER — INSULIN LISPRO PROT & LISPRO (75-25 MIX) 100 UNIT/ML KWIKPEN: PEN_INJECTOR | SUBCUTANEOUS | Status: DC

## 2014-07-21 NOTE — Patient Instructions (Addendum)
Before meals: take 6 units before lunch and 10 units before supper  Please check blood sugars at least half the time about 2 hours after any meal and 3 times per week on waking up. Please bring blood sugar monitor to each visit  Call sugars in 1 week  Stay on Glimeperide

## 2014-07-21 NOTE — Progress Notes (Signed)
Patient ID: Pam Obrien, female   DOB: [birth year], 68 y.o.   MRN: [id number]    Reason for Appointment: Followup for Type 2 Diabetes  Referring physician:   Megan Salon  History of Present Illness:          Diagnosis: Type 2 diabetes mellitus, date of diagnosis:  4/13      Past history:  She was initially started on metformin but he thinks that this made her sleepy and had some abdominal discomfort   She also has been tried on other oral hypoglycemic regimens including Jentadueto  since 2014  But she had similar side effects with this  And not clear how regularly she had been taking this until it was discontinued in 5/15  A1c appears to be persistently over 8% Since 2014  She was tried on Invokamet by her PCP but because of excessive urination with this and also some nausea and weakness it was stopped With glyburide alone her blood sugars are poorly controlled and she was referred here for further management  Recent history:   She was given Januvia but she did not take this because of expense of the medication   She was referred to the nurse educator and started on basal insulin to improve her control on 06/07/14 However even though her fasting readings are improving she had significant postprandial hyperglycemia and overall poor control She was asked to start NovoLog mix 70/30 insulin in 8/13 but she did not take this because of out-of-pocket expense being too high However she is taking Invokana now with a 30 day trial card and is tolerating this She is also taking Amaryl She has done only a few blood sugars and mostly in the mornings before her first meal These are still relatively high Has only a couple of readings in the afternoons and evenings which are variable but overall somewhat better No recent A1c available    Oral hypoglycemic drugs the patient is taking are: Glimepiride, Invokana    Insulin 0 units   Side effects from medications have been: Metformin  causes Sleepiness  and abdominal discomfort Compliance with the medical regimen: fair Glucose monitoring with Accu-Chek meter:    PREMEAL Breakfast  1-2 PM   6-7 PM  Bedtime Overall  Glucose range: 144-203  129, 203   120, 173  ?    Mean/median: 178     171     Glycemic control:  Lab Results  Component Value Date   HGBA1C 11.9* 05/16/2014   HGBA1C 8.8* 10/13/2013   HGBA1C 8.0* 04/12/2013   Lab Results  Component Value Date   LDLCALC 109* 04/12/2013   CREATININE 0.7 10/13/2013    Self-care: The diet that the patient has been following is: tries to limit  portions      Meals: small meals 3-6 x per day. Usually has oatmeal in the morning , sometimes will have only fruit at lunchtime , may have rice or other carbohydrates at dinner time, will have fruit for snacks          Exercise: none, not motivated to walk         Dietician visit: Most recent:3/14.               Retinal exam: Most recent:2014  With the optometrist.    Weight history:  Previous range 160-172   Wt Readings from Last 3 Encounters:  07/21/14 159 lb 12.8 oz (72.485 kg)  06/23/14 166 lb (75.297 kg)  05/27/14 163 lb 3.2  oz (74.027 kg)      Medication List       This list is accurate as of: 07/21/14 12:56 PM.  Always use your most recent med list.               ACCU-CHEK SOFTCLIX LANCETS lancets  Use as instructed to check blood sugar 1 time per day dx code 250.02     atenolol-chlorthalidone 50-25 MG per tablet  Commonly known as:  TENORETIC  TAKE 1 TABLET BY MOUTH EVERY DAY     calcium citrate-vitamin D 315-200 MG-UNIT per tablet  Commonly known as:  CITRACAL+D  Take 1 tablet by mouth daily.     Canagliflozin 100 MG Tabs  Commonly known as:  INVOKANA  Take 1 tablet (100 mg total) by mouth daily before breakfast.     cholecalciferol 1000 UNITS tablet  Commonly known as:  VITAMIN D  Take 2,000 Units by mouth daily.     glimepiride 4 MG tablet  Commonly known as:  AMARYL  Take 1 tablet (4 mg total) by mouth daily before  breakfast.     glucose blood test strip  Commonly known as:  ACCU-CHEK AVIVA PLUS  Use as instructed to check blood sugar 1 time per day dx code 250.02     Insulin Lispro Prot & Lispro (75-25) 100 UNIT/ML Kwikpen  Commonly known as:  HUMALOG MIX 75/25 KWIKPEN  6 units before lunch and 10 units before supper     loratadine 10 MG tablet  Commonly known as:  CLARITIN  Take 1 tablet (10 mg total) by mouth 2 (two) times daily as needed for allergies.     omeprazole 20 MG capsule  Commonly known as:  PRILOSEC  Take 20 mg by mouth daily.     potassium chloride SA 20 MEQ tablet  Commonly known as:  K-DUR,KLOR-CON  - 1 qd  -      SE-TAN PLUS 162-115.2-1 MG Caps  TAKE ONE CAPSULE BY MOUTH EVERY DAY        Allergies:  Allergies  Allergen Reactions  . Aspirin     REACTION: nervous  . Beef-Derived Products   . Celecoxib     REACTION: joints swell  . Milk-Related Compounds   . Mold Extract [Trichophyton]   . Nsaids     REACTION: sweling  . Peanuts [Peanut Oil]   . Shellfish Allergy   . Sulfamethoxazole     REACTION: urticaria (hives)    Past Medical History  Diagnosis Date  . Allergy   . Hyperlipidemia   . Arthritis   . Diabetes mellitus without complication   . Hypertension     Past Surgical History  Procedure Laterality Date  . Colonoscopy  2008  . Flexible sigmoidoscopy  2003  . Abdominal hysterectomy      Family History  Problem Relation Age of Onset  . Hypertension    . Liver cancer    . Stroke    . Diabetes Father   . Hypertension Father   . Heart disease Neg Hx     Social History:  reports that she has never smoked. She has never used smokeless tobacco. She reports that she does not drink alcohol or use illicit drugs.    Review of Systems       Lipids:  Has not been on medications for this and no recent levels available       Lab Results  Component Value Date   CHOL 191 04/12/2013   HDL  56.90 04/12/2013   LDLCALC 109* 04/12/2013   LDLDIRECT  121.7 03/04/2012   TRIG 124.0 04/12/2013   CHOLHDL 3 04/12/2013    On treatment for hypertension with good control   Physical Examination:  BP 118/82  Pulse 87  Temp(Src) 98.1 F (36.7 C)  Resp 16  Ht 5\' 5"  (1.651 m)  Wt 159 lb 12.8 oz (72.485 kg)  BMI 26.59 kg/m2  SpO2 96%      ASSESSMENT:  Diabetes type 2, uncontrolled with A1c nearly 12% In 7/15 She is having difficulties getting her prescriptions because of cost and hasn't taken her insulin Her blood sugars are relatively high in the morning compared to her previous visit although overall her average is slightly better with taking Invokana along with Amaryl Since she will not be ordered to continue her Invokana because of cost will probably need to be on insulin Again she has difficulty affording brand-name medications and does not want to use an insulin with a syringe   Lipids: needs followup evaluation   PLAN:  For convenience she can try Humalog mix insulin with a free coupon  She will start with a small dose before breakfast and supper and call blood sugars next week for dosage adjustment Discussed how premixed works and its components as well as timing as well as potential for hypoglycemia with premixed insulin before breakfast and supper She will also continue Amaryl 4 mg in the morning Needs to start regular walking Discussed needing to check blood sugars at various times including after meals instead of just in the morning Will need A1c in followup   Patient Instructions  Before meals: take 6 units before lunch and 10 units before supper  Please check blood sugars at least half the time about 2 hours after any meal and 3 times per week on waking up. Please bring blood sugar monitor to each visit  Call sugars in 1 week  Stay on Glimeperide    Counseling time over 50% of today's 25 minute visit    Britiny Defrain 07/21/2014, 12:56 PM   Note: This office note was prepared with Teacher, early years/pre. Any transcriptional errors that result from this process are unintentional.

## 2014-08-01 ENCOUNTER — Telehealth: Payer: Self-pay | Admitting: *Deleted

## 2014-08-01 ENCOUNTER — Telehealth: Payer: Self-pay | Admitting: Endocrinology

## 2014-08-01 NOTE — Telephone Encounter (Signed)
Patient called about  her sugars, she said on the 18th her blood sugar was 122 @9 :29 am, at 5:53 pm it was 161. One 9/20 it was 161 when she woke up.  Patient wants to know if she should increase her humalog to 15 units BID?  Please advise

## 2014-08-01 NOTE — Telephone Encounter (Signed)
Increase from 6 to 8 at lunch and 12 at dinner

## 2014-08-01 NOTE — Telephone Encounter (Signed)
Noted, patient is aware. 

## 2014-08-01 NOTE — Telephone Encounter (Signed)
Update since dosage of insulin change please call patient asap

## 2014-08-08 ENCOUNTER — Other Ambulatory Visit: Payer: Self-pay | Admitting: *Deleted

## 2014-08-08 ENCOUNTER — Telehealth: Payer: Self-pay | Admitting: Endocrinology

## 2014-08-08 MED ORDER — INSULIN PEN NEEDLE 32G X 4 MM MISC: Status: DC

## 2014-08-08 NOTE — Telephone Encounter (Signed)
Patient would like her pen needles called in to [facility] on [address]     Thank you

## 2014-08-12 DIAGNOSIS — J3089 Other allergic rhinitis: Secondary | ICD-10-CM | POA: Diagnosis not present

## 2014-08-12 DIAGNOSIS — J301 Allergic rhinitis due to pollen: Secondary | ICD-10-CM | POA: Diagnosis not present

## 2014-08-22 ENCOUNTER — Telehealth: Payer: Self-pay | Admitting: *Deleted

## 2014-08-22 ENCOUNTER — Other Ambulatory Visit: Payer: Self-pay | Admitting: *Deleted

## 2014-08-22 MED ORDER — GLUCOSE BLOOD VI STRP: ORAL_STRIP | Status: DC

## 2014-08-22 NOTE — Telephone Encounter (Signed)
Need RX for strips she is completely out [facility] [address]

## 2014-08-24 DIAGNOSIS — J3089 Other allergic rhinitis: Secondary | ICD-10-CM | POA: Diagnosis not present

## 2014-08-24 DIAGNOSIS — J301 Allergic rhinitis due to pollen: Secondary | ICD-10-CM | POA: Diagnosis not present

## 2014-08-29 ENCOUNTER — Other Ambulatory Visit (INDEPENDENT_AMBULATORY_CARE_PROVIDER_SITE_OTHER): Payer: Medicare Other

## 2014-08-29 DIAGNOSIS — E1165 Type 2 diabetes mellitus with hyperglycemia: Secondary | ICD-10-CM | POA: Diagnosis not present

## 2014-08-29 DIAGNOSIS — E785 Hyperlipidemia, unspecified: Secondary | ICD-10-CM

## 2014-08-29 DIAGNOSIS — IMO0002 Reserved for concepts with insufficient information to code with codable children: Secondary | ICD-10-CM

## 2014-08-29 LAB — COMPREHENSIVE METABOLIC PANEL
ALT: 15 U/L (ref 0–35)
AST: 20 U/L (ref 0–37)
Albumin: 3.5 g/dL (ref 3.5–5.2)
Alkaline Phosphatase: 94 U/L (ref 39–117)
BUN: 17 mg/dL (ref 6–23)
CO2: 30 mEq/L (ref 19–32)
Calcium: 9 mg/dL (ref 8.4–10.5)
Chloride: 99 mEq/L (ref 96–112)
Creatinine, Ser: 0.7 mg/dL (ref 0.4–1.2)
GFR: 99.85 mL/min (ref >60.00)
Glucose, Bld: 150 mg/dL — ABNORMAL HIGH (ref 70–99)
Potassium: 3.2 mEq/L — ABNORMAL LOW (ref 3.5–5.1)
Sodium: 142 mEq/L (ref 135–145)
Total Bilirubin: 0.5 mg/dL (ref 0.2–1.2)
Total Protein: 7.9 g/dL (ref 6.0–8.3)

## 2014-08-29 LAB — LIPID PANEL
Cholesterol: 185 mg/dL (ref 0–200)
HDL: 45.8 mg/dL (ref >39.00)
LDL Cholesterol: 112 mg/dL — ABNORMAL HIGH (ref 0–99)
NonHDL: 139.2
Total CHOL/HDL Ratio: 4
Triglycerides: 135 mg/dL (ref 0.0–149.0)
VLDL: 27 mg/dL (ref 0.0–40.0)

## 2014-08-29 LAB — HEMOGLOBIN A1C: Hgb A1c MFr Bld: 7.8 % — ABNORMAL HIGH (ref 4.6–6.5)

## 2014-09-01 ENCOUNTER — Encounter: Payer: Self-pay | Admitting: Endocrinology

## 2014-09-01 ENCOUNTER — Ambulatory Visit (INDEPENDENT_AMBULATORY_CARE_PROVIDER_SITE_OTHER): Payer: Medicare Other | Admitting: Endocrinology

## 2014-09-01 VITALS — BP 114/75 | HR 72 | Temp 98.5°F | Resp 14 | Ht 65.0 in | Wt 165.4 lb

## 2014-09-01 DIAGNOSIS — E876 Hypokalemia: Secondary | ICD-10-CM | POA: Diagnosis not present

## 2014-09-01 DIAGNOSIS — I10 Essential (primary) hypertension: Secondary | ICD-10-CM | POA: Diagnosis not present

## 2014-09-01 DIAGNOSIS — E1165 Type 2 diabetes mellitus with hyperglycemia: Secondary | ICD-10-CM

## 2014-09-01 DIAGNOSIS — IMO0002 Reserved for concepts with insufficient information to code with codable children: Secondary | ICD-10-CM

## 2014-09-01 NOTE — Patient Instructions (Signed)
16 units before supper of insulin

## 2014-09-01 NOTE — Progress Notes (Signed)
Patient ID: Pam Obrien, female   DOB: 1946-02-08, 68 y.o.   MRN: [id number]    Reason for Appointment: Followup for Type 2 Diabetes  Referring physician:   Megan Salon  History of Present Illness:          Diagnosis: Type 2 diabetes mellitus, date of diagnosis:  4/13      Past history:  She was initially started on metformin but he thinks that this made her sleepy and had some abdominal discomfort   She also has been tried on other oral hypoglycemic regimens including Jentadueto  since 2014  But she had similar side effects with this  And not clear how regularly she had been taking this until it was discontinued in 5/15  A1c appears to be persistently over 8% Since 2014  She was tried on Invokamet by her PCP but because of excessive urination with this and also some nausea and weakness it was stopped With glyburide alone her blood sugars are poorly controlled and she was referred here for further management She was given Januvia but she did not take this because of expense of the medication    Recent history:   She was referred to the nurse educator and started on basal insulin to improve her control on 06/07/14 This was changed to premixed insulin in 9/15 because of continued poor control and her not being able to afford NovoLog mix insulin or Invokana which were tried She is not taking the Humalog mix insulin with the free coupon She was started on 6 units in the morning and 10 units before evening meal and when she reported her blood sugars the doses were increased by 2 units She did not bring her glucose monitor and does not accurately recall her blood sugar readings at home She is not taking any FASTING blood sugars recently She thinks her blood sugars are reasonably good around lunchtime although she thinks she had an asymptomatic reading of under 60 once; subsequently it went up to 300 She does have at least a few readings over 200 after dinner but she does not think this is  consistent She does have some variability in her diet and not consistently getting protein in the morning Having very little for lunch, mostly fruit She is also taking Amaryl Her A1c is better now compared to the last 2 readings No recent A1c available    Oral hypoglycemic drugs the patient is taking are: Glimepiride   Insulin 8---12 units  before meals   Side effects from medications have been: Metformin  causes Sleepiness and abdominal discomfort Compliance with the medical regimen: fair Glucose monitoring with Accu-Chek meter:  By recall   PREMEAL Breakfast Lunch Dinner Bedtime Overall  Glucose range: ?  140 ?     Mean/median:        POST-MEAL PC Breakfast PC Lunch PC Dinner  Glucose range: ?   140- 289  Mean/median:        Glycemic control:   Lab Results  Component Value Date   HGBA1C 7.8* 08/29/2014   HGBA1C 11.9* 05/16/2014   HGBA1C 8.8* 10/13/2013   Lab Results  Component Value Date   LDLCALC 112* 08/29/2014   CREATININE 0.7 08/29/2014    Self-care: The diet that the patient has been following is: tries to limit  portions      Meals:  usually 2-3 meals per day. Usually has oatmeal in the morning , sometimes will have only fruit at lunchtime, may have rice or other  carbohydrates at dinner time, will have fruit for snacks          Exercise: walking 5/7       Dietician visit: Most recent:3/14.              CDE 7/15  Retinal exam: Most recent:2014  With the optometrist.    Weight history:  Previous range 160-172   Wt Readings from Last 3 Encounters:  09/01/14 165 lb 6.4 oz (75.025 kg)  07/21/14 159 lb 12.8 oz (72.485 kg)  06/23/14 166 lb (75.297 kg)      Medication List       This list is accurate as of: 09/01/14  4:45 PM.  Always use your most recent med list.               ACCU-CHEK SOFTCLIX LANCETS lancets  Use as instructed to check blood sugar 1 time per day dx code 250.02     atenolol-chlorthalidone 50-25 MG per tablet  Commonly known as:   TENORETIC  TAKE 1 TABLET BY MOUTH EVERY DAY     calcium citrate-vitamin D 315-200 MG-UNIT per tablet  Commonly known as:  CITRACAL+D  Take 1 tablet by mouth daily.     cholecalciferol 1000 UNITS tablet  Commonly known as:  VITAMIN D  Take 2,000 Units by mouth daily.     glimepiride 4 MG tablet  Commonly known as:  AMARYL  Take 1 tablet (4 mg total) by mouth daily before breakfast.     glucose blood test strip  Commonly known as:  ACCU-CHEK AVIVA PLUS  Use as instructed to check blood sugar 1 time per day dx code E11.65     Insulin Lispro Prot & Lispro (75-25) 100 UNIT/ML Kwikpen  Commonly known as:  HUMALOG 75/25 MIX  8 units before lunch and 12 units before supper     Insulin Pen Needle 32G X 4 MM Misc  Use 2 per day to inject insulin     loratadine 10 MG tablet  Commonly known as:  CLARITIN  Take 1 tablet (10 mg total) by mouth 2 (two) times daily as needed for allergies.     omeprazole 20 MG capsule  Commonly known as:  PRILOSEC  Take 20 mg by mouth daily.     potassium chloride SA 20 MEQ tablet  Commonly known as:  K-DUR,KLOR-CON  - 1 qd  -      SE-TAN PLUS 162-115.2-1 MG Caps  TAKE ONE CAPSULE BY MOUTH EVERY DAY        Allergies:  Allergies  Allergen Reactions  . Aspirin     REACTION: nervous  . Beef-Derived Products   . Celecoxib     REACTION: joints swell  . Milk-Related Compounds   . Mold Extract [Trichophyton]   . Nsaids     REACTION: sweling  . Peanuts [Peanut Oil]   . Shellfish Allergy   . Sulfamethoxazole     REACTION: urticaria (hives)    Past Medical History  Diagnosis Date  . Allergy   . Hyperlipidemia   . Arthritis   . Diabetes mellitus without complication   . Hypertension     Past Surgical History  Procedure Laterality Date  . Colonoscopy  2008  . Flexible sigmoidoscopy  2003  . Abdominal hysterectomy      Family History  Problem Relation Age of Onset  . Hypertension    . Liver cancer    . Stroke    . Diabetes  Father   .  Hypertension Father   . Heart disease Neg Hx     Social History:  reports that she has never smoked. She has never used smokeless tobacco. She reports that she does not drink alcohol or use illicit drugs.    Review of Systems   HYPOKALEMIA: Her potassium is again low She was told to take her potassium supplement every other day Is currently taking 25 mg HCTZ      Lipids:  Has not been on medications for this and LDL is mildly increased       Lab Results  Component Value Date   CHOL 185 08/29/2014   HDL 45.80 08/29/2014   LDLCALC 112* 08/29/2014   LDLDIRECT 121.7 03/04/2012   TRIG 135.0 08/29/2014   CHOLHDL 4 08/29/2014    On treatment for hypertension with good control   Physical Examination:  BP 114/75  Pulse 72  Temp(Src) 98.5 F (36.9 C)  Resp 14  Ht 5\' 5"  (1.651 m)  Wt 165 lb 6.4 oz (75.025 kg)  BMI 27.52 kg/m2  SpO2 99%      ASSESSMENT:  Diabetes type 2, uncontrolled with A1c nearly 12% In 7/15  She appears to have overall much better controlled with taking premixed insulin twice a day Also getting by with relatively small doses Although A1c is significantly improved and below 8% she is still probably has some high readings after meals especially in the evening See history of present illness for current management, problems identified and blood sugar patterns She is satisfied with her current insulin and wants to continue this but not clear if she can afford the Humalog mix since she had not been able to afford the NovoLog mix Discussed that she may be able to use the generic [employer] Novolin 70/30 brand   PLAN:  She will increase the evening dose by 2 units for now Discussed timing of glucose monitoring and blood sugar targets She will need a followup with nurse educator and does need significant amount of overall diabetes education Advised her to have balanced meals especially in the morning and also not delay her lunch time snack She will  also continue Amaryl 4 mg in the morning Needs to continue regular walking She will bring her blood sugar monitor on each visit To call when she is ready for her next prescription Will need A1c in 3 months  She will take her potassium tablet daily and followup with PCP. May need reduction of her HCTZ dosage Also recommended that she have a regular an annual physical which she has not done  Patient Instructions  16 units before supper of insulin   Counseling time over 50% of today's 25 minute visit    Wasil Wolke 09/01/2014, 4:45 PM   Note: This office note was prepared with Dragon voice recognition system technology. Any transcriptional errors that result from this process are unintentional.

## 2014-09-16 DIAGNOSIS — J3089 Other allergic rhinitis: Secondary | ICD-10-CM | POA: Diagnosis not present

## 2014-09-16 DIAGNOSIS — J301 Allergic rhinitis due to pollen: Secondary | ICD-10-CM | POA: Diagnosis not present

## 2014-09-23 DIAGNOSIS — J301 Allergic rhinitis due to pollen: Secondary | ICD-10-CM | POA: Diagnosis not present

## 2014-09-23 DIAGNOSIS — J3089 Other allergic rhinitis: Secondary | ICD-10-CM | POA: Diagnosis not present

## 2014-09-27 ENCOUNTER — Encounter: Payer: Medicare Other | Attending: Endocrinology | Admitting: Nutrition

## 2014-09-27 DIAGNOSIS — Z713 Dietary counseling and surveillance: Secondary | ICD-10-CM | POA: Diagnosis not present

## 2014-09-27 DIAGNOSIS — E1165 Type 2 diabetes mellitus with hyperglycemia: Secondary | ICD-10-CM | POA: Insufficient documentation

## 2014-09-27 DIAGNOSIS — IMO0002 Reserved for concepts with insufficient information to code with codable children: Secondary | ICD-10-CM

## 2014-09-27 NOTE — Patient Instructions (Signed)
Stop drinking regular soda--switch to diet ginerale, Stop drinking orange juice Test blood sugars before breakfast and supper Call me blood sugar readings in one week.  [phone]

## 2014-09-27 NOTE — Progress Notes (Signed)
This patient is here to review her blood sugars and diet.   She did not bring her meter with her today--said she forgot.  She did not test her blood sugar this morning, but says that it was 79 yesterday before supper. She denies low blood sugars.      Medication:  75/25 insulin:  8u before breakfast, and 14u before supper.  Diet:  Bfast: eggs, 2 pieces of bacon, grits 1/2 to 1 cup, not toast or biscuit.  Coffee to drink with water.  She takes her AM dose of insulin before her breakfast meal.           Denies snacking between breakfast and lunch           Lunch:  Soup, or sandwich, with side of veg, or chips           Supper: meat- 3-4 ounces, 2 veg. (one starchy), water to drink           HS: popcorn or anything I have around the house.      She admits to drinking orange juice and drinking regular gingerale--"not daily, but a few times/wk." Discussed the importance of eating a balanced meal with protein 1-2 servings of carbohydrates and not much fried foods.  Also stressed the need to stop drinking juice and sweetened drinks.  She agreed to do this.

## 2014-09-28 DIAGNOSIS — J301 Allergic rhinitis due to pollen: Secondary | ICD-10-CM | POA: Diagnosis not present

## 2014-10-03 DIAGNOSIS — J301 Allergic rhinitis due to pollen: Secondary | ICD-10-CM | POA: Diagnosis not present

## 2014-10-03 DIAGNOSIS — J3089 Other allergic rhinitis: Secondary | ICD-10-CM | POA: Diagnosis not present

## 2014-10-12 DIAGNOSIS — J301 Allergic rhinitis due to pollen: Secondary | ICD-10-CM | POA: Diagnosis not present

## 2014-10-12 DIAGNOSIS — J3089 Other allergic rhinitis: Secondary | ICD-10-CM | POA: Diagnosis not present

## 2014-10-14 DIAGNOSIS — J3089 Other allergic rhinitis: Secondary | ICD-10-CM | POA: Diagnosis not present

## 2014-10-14 DIAGNOSIS — J301 Allergic rhinitis due to pollen: Secondary | ICD-10-CM | POA: Diagnosis not present

## 2014-10-27 ENCOUNTER — Telehealth: Payer: Self-pay | Admitting: Endocrinology

## 2014-10-27 ENCOUNTER — Other Ambulatory Visit: Payer: Self-pay | Admitting: *Deleted

## 2014-10-27 MED ORDER — INSULIN LISPRO PROT & LISPRO (75-25 MIX) 100 UNIT/ML KWIKPEN: PEN_INJECTOR | SUBCUTANEOUS | Status: DC

## 2014-10-27 NOTE — Telephone Encounter (Signed)
rx sent

## 2014-10-27 NOTE — Telephone Encounter (Signed)
Patient need refill of Humalog quick pen

## 2014-11-21 DIAGNOSIS — J3089 Other allergic rhinitis: Secondary | ICD-10-CM | POA: Diagnosis not present

## 2014-11-21 DIAGNOSIS — J301 Allergic rhinitis due to pollen: Secondary | ICD-10-CM | POA: Diagnosis not present

## 2014-11-27 ENCOUNTER — Other Ambulatory Visit: Payer: Self-pay | Admitting: Endocrinology

## 2014-11-28 DIAGNOSIS — I10 Essential (primary) hypertension: Secondary | ICD-10-CM | POA: Diagnosis not present

## 2014-11-28 DIAGNOSIS — M7989 Other specified soft tissue disorders: Secondary | ICD-10-CM | POA: Diagnosis not present

## 2014-11-28 DIAGNOSIS — M25562 Pain in left knee: Secondary | ICD-10-CM | POA: Diagnosis not present

## 2014-11-28 DIAGNOSIS — M1712 Unilateral primary osteoarthritis, left knee: Secondary | ICD-10-CM | POA: Diagnosis not present

## 2014-11-28 DIAGNOSIS — M179 Osteoarthritis of knee, unspecified: Secondary | ICD-10-CM | POA: Diagnosis not present

## 2014-11-28 DIAGNOSIS — M79605 Pain in left leg: Secondary | ICD-10-CM | POA: Diagnosis not present

## 2014-11-28 DIAGNOSIS — E119 Type 2 diabetes mellitus without complications: Secondary | ICD-10-CM | POA: Diagnosis not present

## 2014-12-01 DIAGNOSIS — M25562 Pain in left knee: Secondary | ICD-10-CM | POA: Diagnosis not present

## 2014-12-02 ENCOUNTER — Other Ambulatory Visit: Payer: Medicare Other

## 2014-12-06 ENCOUNTER — Ambulatory Visit: Payer: Medicare Other | Admitting: Endocrinology

## 2014-12-06 ENCOUNTER — Other Ambulatory Visit: Payer: Medicare Other

## 2014-12-07 ENCOUNTER — Other Ambulatory Visit: Payer: Medicare Other

## 2014-12-09 ENCOUNTER — Other Ambulatory Visit: Payer: Medicare Other

## 2014-12-12 ENCOUNTER — Ambulatory Visit: Payer: Medicare Other | Admitting: Endocrinology

## 2014-12-14 ENCOUNTER — Ambulatory Visit: Payer: Medicare Other | Admitting: Endocrinology

## 2015-01-03 DIAGNOSIS — J301 Allergic rhinitis due to pollen: Secondary | ICD-10-CM | POA: Diagnosis not present

## 2015-01-03 DIAGNOSIS — J3089 Other allergic rhinitis: Secondary | ICD-10-CM | POA: Diagnosis not present

## 2015-01-06 ENCOUNTER — Ambulatory Visit (INDEPENDENT_AMBULATORY_CARE_PROVIDER_SITE_OTHER): Payer: Medicare Other | Admitting: Endocrinology

## 2015-01-06 VITALS — BP 120/80 | HR 72 | Temp 97.9°F | Ht 65.0 in | Wt 167.0 lb

## 2015-01-06 DIAGNOSIS — E1165 Type 2 diabetes mellitus with hyperglycemia: Secondary | ICD-10-CM | POA: Diagnosis not present

## 2015-01-06 DIAGNOSIS — I10 Essential (primary) hypertension: Secondary | ICD-10-CM

## 2015-01-06 DIAGNOSIS — IMO0002 Reserved for concepts with insufficient information to code with codable children: Secondary | ICD-10-CM

## 2015-01-06 MED ORDER — METFORMIN HCL ER 500 MG PO TB24: 1000.0000 mg | ORAL_TABLET | Freq: Every day | ORAL | Status: DC

## 2015-01-06 NOTE — Progress Notes (Signed)
Pre visit review using our clinic review tool, if applicable. No additional management support is needed unless otherwise documented below in the visit note. 

## 2015-01-06 NOTE — Progress Notes (Signed)
Patient ID: Pam Obrien, female   DOB: 1945-12-28, 69 y.o.   MRN: [id number]    Reason for Appointment: Followup for Type 2 Diabetes  Referring physician:   Megan Salon  History of Present Illness:          Diagnosis: Type 2 diabetes mellitus, date of diagnosis:  4/13      Past history:  She was initially started on metformin but he thinks that this made her sleepy and had some abdominal discomfort   She also has been tried on other oral hypoglycemic regimens including Jentadueto  since 2014  But she had similar side effects with this  And not clear how regularly she had been taking this until it was discontinued in 5/15  A1c appears to be persistently over 8% Since 2014  She was tried on Invokamet by her PCP but because of excessive urination with this and also some nausea and weakness it was stopped With glyburide alone her blood sugars are poorly controlled and she was referred here for further management She was given Januvia but she did not take this because of expense of the medication   She was referred to the nurse educator and started on basal insulin to improve her control on 06/07/14  Recent history:  No insulin 1 mt This was changed to premixed insulin in 9/15 because of continued poor control and her not being able to afford NovoLog mix insulin or Invokana which were tried She is  taking the Humalog mix insulin with the free coupon She was was taking premixed insulin on her last visit with low doses and reasonably good control. However because of the cost of insulin she stopped this about a month ago She is only taking Amaryl in the morning currently Blood sugars are consistently high now and are averaging about 160 both morning and afternoon Usually not checking her blood sugars after about 3 PM despite reminders about checking postprandial readings No recent A1c available She thinks that she had side effects from metformin only in the large doses and had not tried  smaller doses   Oral hypoglycemic drugs the patient is taking are: Glimepiride   Insulin: None Side effects from medications have been: Metformin  causes Sleepiness and abdominal discomfort Compliance with the medical regimen: fair Glucose monitoring with Accu-Chek meter:    Recent fasting range: 141-184 and afternoon range 129-204.  7: 20 p.m. = 104 Overall average 161    Glycemic control:   Lab Results  Component Value Date   HGBA1C 7.8* 08/29/2014   HGBA1C 11.9* 05/16/2014   HGBA1C 8.8* 10/13/2013   Lab Results  Component Value Date   LDLCALC 112* 08/29/2014   CREATININE 0.7 08/29/2014    Self-care: The diet that the patient has been following is: tries to limit  portions      Meals:  usually 2-3 meals per day. Usually has oatmeal in the morning , sometimes will have only fruit at lunchtime, may have rice or other carbohydrates at dinner time, will have fruit for snacks          Exercise: walking 5/7 days a week, less in winter        Dietician visit: Most recent:3/14.              CDE visit last in 7/15  Retinal exam: Most recent:2014  With the optometrist.    Weight history:  Previous range 160-172   Wt Readings from Last 3 Encounters:  01/06/15 167 lb (75.751  kg)  09/01/14 165 lb 6.4 oz (75.025 kg)  07/21/14 159 lb 12.8 oz (72.485 kg)      Medication List       This list is accurate as of: 01/06/15  4:49 PM.  Always use your most recent med list.               ACCU-CHEK SOFTCLIX LANCETS lancets  Use as instructed to check blood sugar 1 time per day dx code 250.02     atenolol-chlorthalidone 50-25 MG per tablet  Commonly known as:  TENORETIC  TAKE 1 TABLET BY MOUTH EVERY DAY     calcium citrate-vitamin D 315-200 MG-UNIT per tablet  Commonly known as:  CITRACAL+D  Take 1 tablet by mouth daily.     cholecalciferol 1000 UNITS tablet  Commonly known as:  VITAMIN D  Take 2,000 Units by mouth daily.     glimepiride 4 MG tablet  Commonly known as:   AMARYL  Take 1 tablet (4 mg total) by mouth daily before breakfast.     glucose blood test strip  Commonly known as:  ACCU-CHEK AVIVA PLUS  Use as instructed to check blood sugar 1 time per day dx code E11.65     Insulin Lispro Prot & Lispro (75-25) 100 UNIT/ML Kwikpen  Commonly known as:  HUMALOG 75/25 MIX  8 units before lunch and 12 units before supper     Insulin Pen Needle 32G X 4 MM Misc  Use 2 per day to inject insulin     loratadine 10 MG tablet  Commonly known as:  CLARITIN  Take 1 tablet (10 mg total) by mouth 2 (two) times daily as needed for allergies.     omeprazole 20 MG capsule  Commonly known as:  PRILOSEC  Take 20 mg by mouth daily.     potassium chloride SA 20 MEQ tablet  Commonly known as:  K-DUR,KLOR-CON  - 1 qd  -      SE-TAN PLUS 162-115.2-1 MG Caps  TAKE ONE CAPSULE BY MOUTH EVERY DAY        Allergies:  Allergies  Allergen Reactions  . Aspirin     REACTION: nervous  . Beef-Derived Products   . Celecoxib     REACTION: joints swell  . Milk-Related Compounds   . Mold Extract [Trichophyton]   . Nsaids     REACTION: sweling  . Peanuts [Peanut Oil]   . Shellfish Allergy   . Sulfamethoxazole     REACTION: urticaria (hives)    Past Medical History  Diagnosis Date  . Allergy   . Hyperlipidemia   . Arthritis   . Diabetes mellitus without complication   . Hypertension     Past Surgical History  Procedure Laterality Date  . Colonoscopy  2008  . Flexible sigmoidoscopy  2003  . Abdominal hysterectomy      Family History  Problem Relation Age of Onset  . Hypertension    . Liver cancer    . Stroke    . Diabetes Father   . Hypertension Father   . Heart disease Neg Hx     Social History:  reports that she has never smoked. She has never used smokeless tobacco. She reports that she does not drink alcohol or use illicit drugs.    Review of Systems   HYPOKALEMIA: Her potassium has been low previously but no recent labs available.   She is not taking her supplement daily, only every other day  Is currently taking 25  mg HCTZ in her Tenoretic  On treatment for hypertension with good control      Lipids:  Has not been on medications for this and LDL is mildly increased, followed by PCP       Lab Results  Component Value Date   CHOL 185 08/29/2014   HDL 45.80 08/29/2014   LDLCALC 112* 08/29/2014   LDLDIRECT 121.7 03/04/2012   TRIG 135.0 08/29/2014   CHOLHDL 4 08/29/2014    Last foot exam was in 7/15  Physical Examination:  BP 120/80 mmHg  Pulse 72  Temp(Src) 97.9 F (36.6 C) (Oral)  Ht 5\' 5"  (1.651 m)  Wt 167 lb (75.751 kg)  BMI 27.79 kg/m2  No pedal edema present    ASSESSMENT:  Diabetes type 2, uncontrolled with A1c nearly 12% In 7/15  She had previously fairly good control with premixed insulin twice a day and using only 20 units However she is very concerned about the cost of insulin and does not want to try even the generic brand as discussed previously also The blood sugars are moderately high especially fasting A1c needs to be checked when she comes back for follow-up Her compliance with diet and exercise is fair and her weight is about the same   PLAN:  She will increase the Amaryl to twice a day Try metformin ER 500 mg daily at least and if tolerated try 1000 mg after one week. She will need to be seen back in one month to make sure she is having better control  Emphasized the need for regular follow-up and checking some postprandial readings, discussed blood sugar targets  She will take her potassium tablet daily and followup with PCP.     Patient Instructions  Please check blood sugars at least half the time about 2 hours after any meal and 3 times per week on waking up. Please bring blood sugar monitor to each visit. Recommended blood sugar levels about 2 hours after meal is 140-180 and on waking up 90-130  Glimeperide twice daily. May need to reduce if sugar < 90  Start  taking Metformin 500 mg, 1 tablet with your main meal for 7 days. Occasionally this may initially cause loose stools or nausea.  If  tolerating well after 7 days add a second Metformin tablet (500 mg) at the same time.  .      Counseling time over 50% of today's 25 minute visit    Merrik Puebla 01/06/2015, 4:49 PM   Note: This office note was prepared with Estate agent. Any transcriptional errors that result from this process are unintentional.

## 2015-01-06 NOTE — Patient Instructions (Addendum)
Please check blood sugars at least half the time about 2 hours after any meal and 3 times per week on waking up. Please bring blood sugar monitor to each visit. Recommended blood sugar levels about 2 hours after meal is 140-180 and on waking up 90-130  Glimeperide twice daily. May need to reduce if sugar < 90  Start taking Metformin 500 mg, 1 tablet with your main meal for 7 days. Occasionally this may initially cause loose stools or nausea.  If  tolerating well after 7 days add a second Metformin tablet (500 mg) at the same time.  . continue regular walking

## 2015-01-20 ENCOUNTER — Other Ambulatory Visit: Payer: Self-pay | Admitting: Endocrinology

## 2015-01-20 DIAGNOSIS — J301 Allergic rhinitis due to pollen: Secondary | ICD-10-CM | POA: Diagnosis not present

## 2015-01-20 DIAGNOSIS — J3089 Other allergic rhinitis: Secondary | ICD-10-CM | POA: Diagnosis not present

## 2015-01-30 ENCOUNTER — Other Ambulatory Visit: Payer: Self-pay

## 2015-01-30 MED ORDER — ATENOLOL-CHLORTHALIDONE 50-25 MG PO TABS: 1.0000 | ORAL_TABLET | Freq: Every day | ORAL | Status: DC

## 2015-01-30 NOTE — Telephone Encounter (Signed)
Rx request for Atenolol-Chlorthalidone 50-25 mg tablet-Take 1 tablet by mouth daily #90  Pharm:  [facility]  Rx sent to pharmacy and noted to needs to establish care with new PCP.

## 2015-02-06 ENCOUNTER — Other Ambulatory Visit (INDEPENDENT_AMBULATORY_CARE_PROVIDER_SITE_OTHER): Payer: Medicare Other

## 2015-02-06 DIAGNOSIS — E1165 Type 2 diabetes mellitus with hyperglycemia: Secondary | ICD-10-CM

## 2015-02-06 DIAGNOSIS — IMO0002 Reserved for concepts with insufficient information to code with codable children: Secondary | ICD-10-CM

## 2015-02-06 LAB — MICROALBUMIN / CREATININE URINE RATIO
Creatinine,U: 437.5 mg/dL
Microalb Creat Ratio: 1.2 mg/g (ref 0.0–30.0)
Microalb, Ur: 5.3 mg/dL — ABNORMAL HIGH (ref 0.0–1.9)

## 2015-02-06 LAB — BASIC METABOLIC PANEL
BUN: 17 mg/dL (ref 6–23)
CO2: 30 mEq/L (ref 19–32)
Calcium: 9.6 mg/dL (ref 8.4–10.5)
Chloride: 99 mEq/L (ref 96–112)
Creatinine, Ser: 0.72 mg/dL (ref 0.40–1.20)
GFR: 102.92 mL/min (ref >60.00)
Glucose, Bld: 187 mg/dL — ABNORMAL HIGH (ref 70–99)
Potassium: 2.9 mEq/L — ABNORMAL LOW (ref 3.5–5.1)
Sodium: 139 mEq/L (ref 135–145)

## 2015-02-06 LAB — HEMOGLOBIN A1C: Hgb A1c MFr Bld: 7.5 % — ABNORMAL HIGH (ref 4.6–6.5)

## 2015-02-09 ENCOUNTER — Other Ambulatory Visit: Payer: Self-pay | Admitting: *Deleted

## 2015-02-09 ENCOUNTER — Encounter: Payer: Self-pay | Admitting: Endocrinology

## 2015-02-09 ENCOUNTER — Ambulatory Visit (INDEPENDENT_AMBULATORY_CARE_PROVIDER_SITE_OTHER): Payer: Medicare Other | Admitting: Endocrinology

## 2015-02-09 VITALS — BP 164/96 | HR 91 | Temp 98.3°F | Resp 14 | Ht 65.0 in | Wt 163.8 lb

## 2015-02-09 DIAGNOSIS — E876 Hypokalemia: Secondary | ICD-10-CM | POA: Diagnosis not present

## 2015-02-09 DIAGNOSIS — IMO0002 Reserved for concepts with insufficient information to code with codable children: Secondary | ICD-10-CM

## 2015-02-09 DIAGNOSIS — E1165 Type 2 diabetes mellitus with hyperglycemia: Secondary | ICD-10-CM

## 2015-02-09 MED ORDER — POTASSIUM CHLORIDE CRYS ER 20 MEQ PO TBCR: EXTENDED_RELEASE_TABLET | ORAL | Status: DC

## 2015-02-09 NOTE — Patient Instructions (Signed)
Reduce BP med to 1/2 daily and add amlodipine 5mg  in am daily  Potassium 2 daily for a week and then 1 daily  GLIMEPERIDE 1 at supper and 1/2 at bedtime  Please check blood sugars at least half the time about 2 hours after any meal and 3 times per week on waking up. Please bring blood sugar monitor to each visit. Recommended blood sugar levels about 2 hours after meal is 140-180 and on waking up 90-130

## 2015-02-09 NOTE — Progress Notes (Signed)
Patient ID: Pam Obrien, female   DOB: May 30, 1946, 69 y.o.   MRN: [id number]    Reason for Appointment: Followup for Type 2 Diabetes  Referring physician:   Megan Salon  History of Present Illness:          Diagnosis: Type 2 diabetes mellitus, date of diagnosis:  4/13      Past history:  She was initially started on metformin but he thinks that this made her sleepy and had some abdominal discomfort   She also has been tried on other oral hypoglycemic regimens including Jentadueto  since 2014  But she had similar side effects with this  And not clear how regularly she had been taking this until it was discontinued in 5/15  A1c appears to be persistently over 8% Since 2014  She was tried on Invokamet by her PCP but because of excessive urination with this and also some nausea and weakness it was stopped With glyburide alone her blood sugars are poorly controlled and she was referred here for further management She was given Januvia but she did not take this because of expense of the medication   She was referred to the nurse educator and started on basal insulin to improve her control on 06/07/14 This was changed to premixed insulin in 9/15 because of continued poor control and her not being able to afford NovoLog mix insulin or Invokana   Recent history: 3/16 She stopped taking insulin in 1/16 because of the cost and was taking only Amaryl. Because of her relatively high readings she was told to take the Amaryl twice a day and start back on metformin ER She has taken metformin ER but only able to take one tablet today, has diarrhea and abdominal discomfort with 2 tablets A1c is slightly higher at 7.8 Although she is checking her blood sugars about every other days she is doing the mostly in the morning before her first meal and these are consistently high No hypoglycemia with taking Amaryl twice a day Blood sugar was relatively good at 130 after dinner last night but higher today at 223;  did not watch her diet this morning  Oral hypoglycemic drugs the patient is taking are: Glimepiride bid, metformin ER 500 mg in a.m.   Insulin: None Side effects from medications have been: Metformin  causes Sleepiness and abdominal discomfort Compliance with the medical regimen: fair Glucose monitoring with Accu-Chek meter:    Recent fasting range: 148-184 6 PM 144 Overall average 162   Glycemic control:   Lab Results  Component Value Date   HGBA1C 7.5* 02/06/2015   HGBA1C 7.8* 08/29/2014   HGBA1C 11.9* 05/16/2014   Lab Results  Component Value Date   MICROALBUR 5.3* 02/06/2015   LDLCALC 112* 08/29/2014   CREATININE 0.72 02/06/2015    Self-care: The diet that the patient has been following is: tries to limit  portions      Meals:  usually 2-3 meals per day. Usually has oatmeal in the morning , sometimes will have only fruit at lunchtime, may have rice or other carbohydrates at dinner time, will have fruit for snacks          Exercise: walking 5/7 days a week        Dietician visit: Most recent:3/14.              CDE visit last in 7/15  Retinal exam: Most recent:2014  With the optometrist.    Weight history:  Previous range 160-172   Wt  Readings from Last 3 Encounters:  02/09/15 163 lb 12.8 oz (74.299 kg)  01/06/15 167 lb (75.751 kg)  09/01/14 165 lb 6.4 oz (75.025 kg)      Medication List       This list is accurate as of: 02/09/15 11:59 PM.  Always use your most recent med list.               ACCU-CHEK SOFTCLIX LANCETS lancets  Use as instructed to check blood sugar 1 time per day dx code 250.02     atenolol-chlorthalidone 50-25 MG per tablet  Commonly known as:  TENORETIC  Take 1 tablet by mouth daily.     calcium citrate-vitamin D 315-200 MG-UNIT per tablet  Commonly known as:  CITRACAL+D  Take 1 tablet by mouth daily.     cholecalciferol 1000 UNITS tablet  Commonly known as:  VITAMIN D  Take 2,000 Units by mouth daily.     glimepiride 4 MG tablet   Commonly known as:  AMARYL  TAKE 1 TABLET BY MOUTH EVERY DAY IF BLOOD SUGARS GO UNDER 80 TAKE HALF A TABLET     glucose blood test strip  Commonly known as:  ACCU-CHEK AVIVA PLUS  Use as instructed to check blood sugar 1 time per day dx code E11.65     Insulin Pen Needle 32G X 4 MM Misc  Use 2 per day to inject insulin     loratadine 10 MG tablet  Commonly known as:  CLARITIN  Take 1 tablet (10 mg total) by mouth 2 (two) times daily as needed for allergies.     metFORMIN 500 MG 24 hr tablet  Commonly known as:  GLUCOPHAGE-XR  Take 2 tablets (1,000 mg total) by mouth daily with supper.     omeprazole 20 MG capsule  Commonly known as:  PRILOSEC  Take 20 mg by mouth daily.     potassium chloride SA 20 MEQ tablet  Commonly known as:  K-DUR,KLOR-CON  Take 2 tablets daily for the first week, then take 1 tablet daily     SE-TAN PLUS 162-115.2-1 MG Caps  TAKE ONE CAPSULE BY MOUTH EVERY DAY        Allergies:  Allergies  Allergen Reactions  . Aspirin     REACTION: nervous  . Beef-Derived Products   . Celecoxib     REACTION: joints swell  . Milk-Related Compounds   . Mold Extract [Trichophyton]   . Nsaids     REACTION: sweling  . Peanuts [Peanut Oil]   . Shellfish Allergy   . Sulfamethoxazole     REACTION: urticaria (hives)    Past Medical History  Diagnosis Date  . Allergy   . Hyperlipidemia   . Arthritis   . Diabetes mellitus without complication   . Hypertension     Past Surgical History  Procedure Laterality Date  . Colonoscopy  2008  . Flexible sigmoidoscopy  2003  . Abdominal hysterectomy      Family History  Problem Relation Age of Onset  . Hypertension    . Liver cancer    . Stroke    . Diabetes Father   . Hypertension Father   . Heart disease Neg Hx     Social History:  reports that she has never smoked. She has never used smokeless tobacco. She reports that she does not drink alcohol or use illicit drugs.    Review of Systems    HYPOKALEMIA: Her potassium has been low consistently   She is  not taking her supplement because of some insurance issues.  Has not followed up with PCP Potassium is worse at 2.9  Is currently taking 25 mg HCTZ in her Tenoretic      Lipids:  Has not been on medications for this and LDL is mildly increased, followed by PCP       Lab Results  Component Value Date   CHOL 185 08/29/2014   HDL 45.80 08/29/2014   LDLCALC 112* 08/29/2014   LDLDIRECT 121.7 03/04/2012   TRIG 135.0 08/29/2014   CHOLHDL 4 08/29/2014    Last foot exam was in 7/15  Physical Examination:  BP 164/96 mmHg  Pulse 91  Temp(Src) 98.3 F (36.8 C)  Resp 14  Ht 5\' 5"  (1.651 m)  Wt 163 lb 12.8 oz (74.299 kg)  BMI 27.26 kg/m2  SpO2 98%  No pedal edema present    ASSESSMENT:  Diabetes type 2, uncontrolled with A1c nearly 12% In 7/15  She had previously fairly good control with premixed insulin twice a day and using 20 units She is not having much higher blood sugars without insulin but still has significantly high fasting readings She is again insisting on continuing generic medications and not interested in insulin again because of cost Discussed that she probably has some insulin deficiency since blood sugars are still high with taking maximum dose Amaryl She can only tolerate 500 mg of metformin ER  HYPOKALEMIA: She has a potassium of 2.9 and this is likely to be from a diuretic but may also have hyperaldosteronism with her blood pressure being significantly high.  Hypertension: Blood pressure is significantly high today, previously has been better  PLAN:  She will stop the Amaryl in the morning and take a half a tablet traditionally at bedtime to help fasting readings We will need to control her hypokalemia which may improve her beta cell function also Continue metformin 500 mg Will need to consider adding low-dose NPH at bedtime if fasting readings continue to be significantly high and A1c not at  target  She will try to do more readings after meals also, discussed blood sugar targets  She needs to improve her compliance with taking her potassium supplements Meanwhile will increase her dose to twice a day on the potassium She will reduce her Tenoretic to half tablet to avoid hypokalemia and start amlodipine to help with control May also consider using Aldactone   Patient Instructions  Reduce BP med to 1/2 daily and add amlodipine 5mg  in am daily  Potassium 2 daily for a week and then 1 daily  GLIMEPERIDE 1 at supper and 1/2 at bedtime  Please check blood sugars at least half the time about 2 hours after any meal and 3 times per week on waking up. Please bring blood sugar monitor to each visit. Recommended blood sugar levels about 2 hours after meal is 140-180 and on waking up 90-130    Counseling time over 50% of today's 25 minute visit    Pam Obrien 02/10/2015, 9:56 AM   Note: This office note was prepared with Estate agent. Any transcriptional errors that result from this process are unintentional.

## 2015-02-13 ENCOUNTER — Telehealth: Payer: Self-pay | Admitting: Endocrinology

## 2015-02-13 ENCOUNTER — Other Ambulatory Visit: Payer: Self-pay | Admitting: *Deleted

## 2015-02-13 MED ORDER — GLIMEPIRIDE 4 MG PO TABS: ORAL_TABLET | ORAL | Status: DC

## 2015-02-13 NOTE — Telephone Encounter (Signed)
Patient stated that need Glimepiride refilled need, she need more than 30 day supply because she has t take it 2 x a day, please advise

## 2015-02-13 NOTE — Telephone Encounter (Signed)
rx sent for 1 1/2 tablets daily

## 2015-02-15 ENCOUNTER — Other Ambulatory Visit: Payer: Self-pay | Admitting: Endocrinology

## 2015-02-24 DIAGNOSIS — J301 Allergic rhinitis due to pollen: Secondary | ICD-10-CM | POA: Diagnosis not present

## 2015-02-24 DIAGNOSIS — J3089 Other allergic rhinitis: Secondary | ICD-10-CM | POA: Diagnosis not present

## 2015-03-06 ENCOUNTER — Other Ambulatory Visit (INDEPENDENT_AMBULATORY_CARE_PROVIDER_SITE_OTHER): Payer: Medicare Other

## 2015-03-06 DIAGNOSIS — E876 Hypokalemia: Secondary | ICD-10-CM

## 2015-03-06 DIAGNOSIS — IMO0002 Reserved for concepts with insufficient information to code with codable children: Secondary | ICD-10-CM

## 2015-03-06 DIAGNOSIS — E1165 Type 2 diabetes mellitus with hyperglycemia: Secondary | ICD-10-CM

## 2015-03-06 LAB — BASIC METABOLIC PANEL
BUN: 17 mg/dL (ref 6–23)
CO2: 30 mEq/L (ref 19–32)
Calcium: 9.4 mg/dL (ref 8.4–10.5)
Chloride: 99 mEq/L (ref 96–112)
Creatinine, Ser: 0.67 mg/dL (ref 0.40–1.20)
GFR: 111.81 mL/min (ref >60.00)
Glucose, Bld: 184 mg/dL — ABNORMAL HIGH (ref 70–99)
Potassium: 3 mEq/L — ABNORMAL LOW (ref 3.5–5.1)
Sodium: 139 mEq/L (ref 135–145)

## 2015-03-07 LAB — FRUCTOSAMINE: Fructosamine: 276 umol/L (ref 0–285)

## 2015-03-09 ENCOUNTER — Ambulatory Visit (INDEPENDENT_AMBULATORY_CARE_PROVIDER_SITE_OTHER): Payer: Medicare Other | Admitting: Endocrinology

## 2015-03-09 ENCOUNTER — Encounter: Payer: Self-pay | Admitting: Endocrinology

## 2015-03-09 VITALS — BP 138/82 | HR 75 | Temp 98.1°F | Resp 14 | Ht 65.0 in | Wt 160.8 lb

## 2015-03-09 DIAGNOSIS — E876 Hypokalemia: Secondary | ICD-10-CM | POA: Diagnosis not present

## 2015-03-09 DIAGNOSIS — E1165 Type 2 diabetes mellitus with hyperglycemia: Secondary | ICD-10-CM | POA: Diagnosis not present

## 2015-03-09 DIAGNOSIS — IMO0002 Reserved for concepts with insufficient information to code with codable children: Secondary | ICD-10-CM

## 2015-03-09 MED ORDER — SPIRONOLACTONE 50 MG PO TABS: 50.0000 mg | ORAL_TABLET | Freq: Every day | ORAL | Status: DC

## 2015-03-09 NOTE — Progress Notes (Signed)
Patient ID: Pam Obrien, female   DOB: October 08, 1946, 69 y.o.   MRN: [id number]    Reason for Appointment: Followup for Type 2 Diabetes  Referring physician:   Megan Salon  History of Present Illness:          Diagnosis: Type 2 diabetes mellitus, date of diagnosis:  4/13      Past history:  She was initially started on metformin but he thinks that this made her sleepy and had some abdominal discomfort   She also has been tried on other oral hypoglycemic regimens including Jentadueto  since 2014  But she had similar side effects with this  And not clear how regularly she had been taking this until it was discontinued in 5/15  A1c appears to be persistently over 8% Since 2014  She was tried on Invokamet by her PCP but because of excessive urination with this and also some nausea and weakness it was stopped With glyburide alone her blood sugars are poorly controlled and she was referred here for further management She was given Januvia but she did not take this because of expense of the medication   She was referred to the nurse educator and started on basal insulin to improve her control on 06/07/14 This was changed to premixed insulin in 9/15 because of continued poor control and her not being able to afford NovoLog mix insulin or Invokana  She stopped taking insulin in 1/16 because of the cost and was taking only Amaryl.  Recent history:   On her last visit she was changed to Amaryl at supper and bedtime only instead of before breakfast and supper because of persistently high fasting readings She is also taking metformin ER 500 mg daily at suppertime, unable to tolerate higher dose because of diarrhea and abdominal discomfort with 2 tablets Although she has not checked her sugars consistently after meals her blood sugars are somewhat better in the morning Has a couple of good readings at bedtime and were able readings in the early afternoon  Oral hypoglycemic drugs the patient is taking  are: Glimepiride  4 mg at supper and 2 mg at bedtime, metformin ER 500 mg in pm     Side effects from medications have been: Metformin  causes Sleepiness and abdominal discomfort Compliance with the medical regimen: fair Glucose monitoring with Accu-Chek meter:    Recent fasting range:  136-163 in the morning 2 PM 125, 165 and bedtime 124, 136 Overall average 150    Glycemic control:   Lab Results  Component Value Date   HGBA1C 7.5* 02/06/2015   HGBA1C 7.8* 08/29/2014   HGBA1C 11.9* 05/16/2014   Lab Results  Component Value Date   MICROALBUR 5.3* 02/06/2015   LDLCALC 112* 08/29/2014   CREATININE 0.67 03/06/2015    Self-care: The diet that the patient has been following is: tries to limit  portions      Meals:  usually 2-3 meals per day. Usually has oatmeal in the morning , sometimes will have only fruit at lunchtime, may have rice or other carbohydrates at dinner time, will have fruit for snacks          Exercise: walking 5/7 days a week        Dietician visit: Most recent:3/14.              CDE visit last in 7/15  Retinal exam: Most recent:2014  With the optometrist.    Weight history:  Previous range 160-172   Wt Readings from  Last 3 Encounters:  03/09/15 160 lb 12.8 oz (72.938 kg)  02/09/15 163 lb 12.8 oz (74.299 kg)  01/06/15 167 lb (75.751 kg)      Medication List       This list is accurate as of: 03/09/15  9:10 PM.  Always use your most recent med list.               ACCU-CHEK SOFTCLIX LANCETS lancets  USE AS DIRECTED EVERY DAY     atenolol-chlorthalidone 50-25 MG per tablet  Commonly known as:  TENORETIC  Take 1 tablet by mouth daily.     calcium citrate-vitamin D 315-200 MG-UNIT per tablet  Commonly known as:  CITRACAL+D  Take 1 tablet by mouth daily.     cholecalciferol 1000 UNITS tablet  Commonly known as:  VITAMIN D  Take 2,000 Units by mouth daily.     glimepiride 4 MG tablet  Commonly known as:  AMARYL  Take 1 tablet at supper and 1/2  tablet at bedtime     glucose blood test strip  Commonly known as:  ACCU-CHEK AVIVA PLUS  Use as instructed to check blood sugar 1 time per day dx code E11.65     Insulin Pen Needle 32G X 4 MM Misc  Use 2 per day to inject insulin     loratadine 10 MG tablet  Commonly known as:  CLARITIN  Take 1 tablet (10 mg total) by mouth 2 (two) times daily as needed for allergies.     metFORMIN 500 MG 24 hr tablet  Commonly known as:  GLUCOPHAGE-XR  Take 2 tablets (1,000 mg total) by mouth daily with supper.     omeprazole 20 MG capsule  Commonly known as:  PRILOSEC  Take 20 mg by mouth daily.     potassium chloride SA 20 MEQ tablet  Commonly known as:  K-DUR,KLOR-CON  Take 2 tablets daily for the first week, then take 1 tablet daily     SE-TAN PLUS 162-115.2-1 MG Caps  TAKE ONE CAPSULE BY MOUTH EVERY DAY     spironolactone 50 MG tablet  Commonly known as:  ALDACTONE  Take 1 tablet (50 mg total) by mouth daily.        Allergies:  Allergies  Allergen Reactions  . Aspirin     REACTION: nervous  . Beef-Derived Products   . Celecoxib     REACTION: joints swell  . Milk-Related Compounds   . Mold Extract [Trichophyton]   . Nsaids     REACTION: sweling  . Peanuts [Peanut Oil]   . Shellfish Allergy   . Sulfamethoxazole     REACTION: urticaria (hives)    Past Medical History  Diagnosis Date  . Allergy   . Hyperlipidemia   . Arthritis   . Diabetes mellitus without complication   . Hypertension     Past Surgical History  Procedure Laterality Date  . Colonoscopy  2008  . Flexible sigmoidoscopy  2003  . Abdominal hysterectomy      Family History  Problem Relation Age of Onset  . Hypertension    . Liver cancer    . Stroke    . Diabetes Father   . Hypertension Father   . Heart disease Neg Hx     Social History:  reports that she has never smoked. She has never used smokeless tobacco. She reports that she does not drink alcohol or use illicit drugs.    Review of  Systems   HYPOKALEMIA: Her potassium has  been low consistently She has started back on her potassium supplement; she was asked to take only half of her Tenoretic tablet bruit is still taking the whole tablet including 25 mg of chlorthalidone Potassium is still low at 3.0 However her blood pressure is somewhat better      Lipids:  Has not been on medications for this and LDL is mildly increased, followed by PCP       Lab Results  Component Value Date   CHOL 185 08/29/2014   HDL 45.80 08/29/2014   LDLCALC 112* 08/29/2014   LDLDIRECT 121.7 03/04/2012   TRIG 135.0 08/29/2014   CHOLHDL 4 08/29/2014    Last foot exam was in 7/15  Physical Examination:  BP 138/82 mmHg  Pulse 75  Temp(Src) 98.1 F (36.7 C)  Resp 14  Ht 5\' 5"  (1.651 m)  Wt 160 lb 12.8 oz (72.938 kg)  BMI 26.76 kg/m2  SpO2 97%  No pedal edema present    ASSESSMENT:  Diabetes type 2, uncontrolled   She has fair control of her diabetes with Amaryl and low dose metformin Fructosamine is high normal and control may be adequate for her age Since she does not want to take any brand-name medications will continue to work with her Amaryl dose and low dose metformin which she cannot increase because of tolerability issues  HYPOKALEMIA: This is still not controlled because of her taking chlorthalidone 25 mg but also may have some degree of hyperaldosteronism  Hypertension: Blood pressure is relatively better today  PLAN:  She will try 2 mg Amaryl at suppertime and 4 mg at bedtime We will need to control her hypokalemia which may improve her beta cell function also Continue metformin 500 mg Will need to consider adding low-dose NPH at bedtime if fasting readings continue to be significantly high and A1c not improved on the next visit  She will try to do more readings after meals also, discussed blood sugar targets  She will reduce her Tenoretic to half tablet to avoid hypokalemia and start ALDACTONE to reduce  hypokalemia and also continue improving her blood pressure control Will check her potassium in 2 weeks again and stop supplement in about a week   Patient Instructions  New BP pill Spironolactone in am  CUT ATENOLOL IN HALF  Glimeperide 1/2 at supper and 1 at bedtime  Stop potassium in 1 week   Counseling time over 50% of today's 25 minute visit    Kristof Nadeem 03/09/2015, 9:10 PM   Note: This office note was prepared with Estate agent. Any transcriptional errors that result from this process are unintentional.

## 2015-03-09 NOTE — Patient Instructions (Signed)
New BP pill Spironolactone in am  CUT ATENOLOL IN HALF  Glimeperide 1/2 at supper and 1 at bedtime  Stop potassium in 1 week

## 2015-03-23 ENCOUNTER — Other Ambulatory Visit (INDEPENDENT_AMBULATORY_CARE_PROVIDER_SITE_OTHER): Payer: Medicare Other

## 2015-03-23 DIAGNOSIS — E1165 Type 2 diabetes mellitus with hyperglycemia: Secondary | ICD-10-CM | POA: Diagnosis not present

## 2015-03-23 DIAGNOSIS — IMO0002 Reserved for concepts with insufficient information to code with codable children: Secondary | ICD-10-CM

## 2015-03-23 LAB — LIPID PANEL
Cholesterol: 175 mg/dL (ref 0–200)
HDL: 55.6 mg/dL (ref >39.00)
LDL Cholesterol: 101 mg/dL — ABNORMAL HIGH (ref 0–99)
NonHDL: 119.4
Total CHOL/HDL Ratio: 3
Triglycerides: 93 mg/dL (ref 0.0–149.0)
VLDL: 18.6 mg/dL (ref 0.0–40.0)

## 2015-03-23 LAB — COMPREHENSIVE METABOLIC PANEL
ALT: 20 U/L (ref 0–35)
AST: 19 U/L (ref 0–37)
Albumin: 4.1 g/dL (ref 3.5–5.2)
Alkaline Phosphatase: 83 U/L (ref 39–117)
BUN: 17 mg/dL (ref 6–23)
CO2: 28 mEq/L (ref 19–32)
Calcium: 9.2 mg/dL (ref 8.4–10.5)
Chloride: 100 mEq/L (ref 96–112)
Creatinine, Ser: 0.79 mg/dL (ref 0.40–1.20)
GFR: 92.44 mL/min (ref >60.00)
Glucose, Bld: 207 mg/dL — ABNORMAL HIGH (ref 70–99)
Potassium: 3.7 mEq/L (ref 3.5–5.1)
Sodium: 137 mEq/L (ref 135–145)
Total Bilirubin: 0.5 mg/dL (ref 0.2–1.2)
Total Protein: 7.6 g/dL (ref 6.0–8.3)

## 2015-03-23 LAB — HEMOGLOBIN A1C: Hgb A1c MFr Bld: 6.8 % — ABNORMAL HIGH (ref 4.6–6.5)

## 2015-03-24 ENCOUNTER — Telehealth: Payer: Self-pay

## 2015-03-24 NOTE — Telephone Encounter (Signed)
Left message for pt to call back concerning need for mammogram screening.

## 2015-03-27 DIAGNOSIS — J301 Allergic rhinitis due to pollen: Secondary | ICD-10-CM | POA: Diagnosis not present

## 2015-03-27 DIAGNOSIS — J3089 Other allergic rhinitis: Secondary | ICD-10-CM | POA: Diagnosis not present

## 2015-04-26 DIAGNOSIS — J3089 Other allergic rhinitis: Secondary | ICD-10-CM | POA: Diagnosis not present

## 2015-04-26 DIAGNOSIS — J301 Allergic rhinitis due to pollen: Secondary | ICD-10-CM | POA: Diagnosis not present

## 2015-05-05 ENCOUNTER — Other Ambulatory Visit: Payer: Medicare Other

## 2015-05-08 ENCOUNTER — Other Ambulatory Visit: Payer: Medicare Other

## 2015-05-08 ENCOUNTER — Other Ambulatory Visit: Payer: Self-pay | Admitting: *Deleted

## 2015-05-10 ENCOUNTER — Encounter: Payer: Self-pay | Admitting: Endocrinology

## 2015-05-10 ENCOUNTER — Ambulatory Visit (INDEPENDENT_AMBULATORY_CARE_PROVIDER_SITE_OTHER): Payer: Medicare Other | Admitting: Endocrinology

## 2015-05-10 VITALS — BP 130/72 | HR 74 | Temp 98.2°F | Resp 16 | Ht 65.0 in | Wt 157.0 lb

## 2015-05-10 DIAGNOSIS — E785 Hyperlipidemia, unspecified: Secondary | ICD-10-CM | POA: Diagnosis not present

## 2015-05-10 DIAGNOSIS — E1165 Type 2 diabetes mellitus with hyperglycemia: Secondary | ICD-10-CM

## 2015-05-10 DIAGNOSIS — E876 Hypokalemia: Secondary | ICD-10-CM

## 2015-05-10 DIAGNOSIS — IMO0002 Reserved for concepts with insufficient information to code with codable children: Secondary | ICD-10-CM

## 2015-05-10 MED ORDER — ATORVASTATIN CALCIUM 10 MG PO TABS: 10.0000 mg | ORAL_TABLET | Freq: Every day | ORAL | Status: DC

## 2015-05-10 NOTE — Progress Notes (Signed)
Patient ID: Pam Obrien, female   DOB: 1946-07-13, 69 y.o.   MRN: [id number]    Reason for Appointment: Followup for Type 2 Diabetes  Referring physician:   Megan Salon  History of Present Illness:          Diagnosis: Type 2 diabetes mellitus, date of diagnosis:  4/13      Past history:  She was initially started on metformin but he thinks that this made her sleepy and had some abdominal discomfort   She also has been tried on other oral hypoglycemic regimens including Jentadueto  since 2014  But she had similar side effects with this  And not clear how regularly she had been taking this until it was discontinued in 5/15  A1c appears to be persistently over 8% Since 2014  She was tried on Invokamet by her PCP but because of excessive urination with this and also some nausea and weakness it was stopped With glyburide alone her blood sugars are poorly controlled and she was referred here for further management She was given Januvia but she did not take this because of expense of the medication   She was referred to the nurse educator and started on basal insulin to improve her control on 06/07/14 This was changed to premixed insulin in 9/15 because of continued poor control and her not being able to afford NovoLog mix insulin or Invokana  She stopped taking insulin in 1/16 because of the cost and was taking only Amaryl.  Recent history:   Since she had not wanted to use a brand name diabetes medication she was continued on Amaryl and metformin She tends to have high fasting readings and Amaryl was changed to 4 mg at bedtime and 2 mg continued at suppertime Her A1c has improved to 6.8 as of 5/16 However she did not bring her monitor for download and not clear if she is monitoring consistently Her lab glucose was high at 207 at 11 AM, possibly after breakfast  She has gradually lost weight this year She has tried to be fairly good with her walking routine and she thinks her  diet is reasonably good. She is also taking metformin ER 500 mg daily at suppertime, unable to tolerate higher dose because of diarrhea and abdominal discomfort with 2 tablets  Oral hypoglycemic drugs the patient is taking are: Glimepiride  2 mg at supper and 4 mg at bedtime, metformin ER 500 mg in pm     Side effects from medications have been: Metformin causes  and abdominal discomfort Compliance with the medical regimen: fair Glucose monitoring with Accu-Chek meter:    Recent fasting range:  129-147 by recall   Glycemic control:   Lab Results  Component Value Date   HGBA1C 6.8* 03/23/2015   HGBA1C 7.5* 02/06/2015   HGBA1C 7.8* 08/29/2014   Lab Results  Component Value Date   MICROALBUR 5.3* 02/06/2015   LDLCALC 101* 03/23/2015   CREATININE 0.79 03/23/2015    Self-care: The diet that the patient has been following is: tries to limit  portions      Meals:  usually 2-3 meals per day. Usually has oatmeal in the morning, sometimes will have only fruit at lunchtime, may have rice or other carbohydrates at dinner time, will have fruit for snacks   .        Exercise: walking 5/7 days a week, 1 mile       Dietician visit: Most recent:3/14.  CDE visit last in 7/15  Retinal exam: Most recent:2014  With the optometrist.    Weight history:  Previous range 160-172   Wt Readings from Last 3 Encounters:  05/10/15 157 lb (71.215 kg)  03/09/15 160 lb 12.8 oz (72.938 kg)  02/09/15 163 lb 12.8 oz (74.299 kg)      Medication List       This list is accurate as of: 05/10/15  9:06 PM.  Always use your most recent med list.               ACCU-CHEK SOFTCLIX LANCETS lancets  USE AS DIRECTED EVERY DAY     atenolol-chlorthalidone 50-25 MG per tablet  Commonly known as:  TENORETIC  Take 1 tablet by mouth daily.     atorvastatin 10 MG tablet  Commonly known as:  LIPITOR  Take 1 tablet (10 mg total) by mouth daily.     calcium citrate-vitamin D 315-200 MG-UNIT per  tablet  Commonly known as:  CITRACAL+D  Take 1 tablet by mouth daily.     cholecalciferol 1000 UNITS tablet  Commonly known as:  VITAMIN D  Take 2,000 Units by mouth daily.     glimepiride 4 MG tablet  Commonly known as:  AMARYL  Take 1 tablet at supper and 1/2 tablet at bedtime     glucose blood test strip  Commonly known as:  ACCU-CHEK AVIVA PLUS  Use as instructed to check blood sugar 1 time per day dx code E11.65     Insulin Pen Needle 32G X 4 MM Misc  Use 2 per day to inject insulin     loratadine 10 MG tablet  Commonly known as:  CLARITIN  Take 1 tablet (10 mg total) by mouth 2 (two) times daily as needed for allergies.     metFORMIN 500 MG 24 hr tablet  Commonly known as:  GLUCOPHAGE-XR  Take 2 tablets (1,000 mg total) by mouth daily with supper.     omeprazole 20 MG capsule  Commonly known as:  PRILOSEC  Take 20 mg by mouth daily.     potassium chloride SA 20 MEQ tablet  Commonly known as:  K-DUR,KLOR-CON  Take 2 tablets daily for the first week, then take 1 tablet daily     SE-TAN PLUS 162-115.2-1 MG Caps  TAKE ONE CAPSULE BY MOUTH EVERY DAY     spironolactone 50 MG tablet  Commonly known as:  ALDACTONE  Take 1 tablet (50 mg total) by mouth daily.        Allergies:  Allergies  Allergen Reactions  . Aspirin     REACTION: nervous  . Beef-Derived Products   . Celecoxib     REACTION: joints swell  . Milk-Related Compounds   . Mold Extract [Trichophyton]   . Nsaids     REACTION: sweling  . Peanuts [Peanut Oil]   . Shellfish Allergy   . Sulfamethoxazole     REACTION: urticaria (hives)    Past Medical History  Diagnosis Date  . Allergy   . Hyperlipidemia   . Arthritis   . Diabetes mellitus without complication   . Hypertension     Past Surgical History  Procedure Laterality Date  . Colonoscopy  2008  . Flexible sigmoidoscopy  2003  . Abdominal hysterectomy      Family History  Problem Relation Age of Onset  . Hypertension    . Liver  cancer    . Stroke    . Diabetes Father   .  Hypertension Father   . Stroke Father   . Heart disease Neg Hx     Social History:  reports that she has never smoked. She has never used smokeless tobacco. She reports that she does not drink alcohol or use illicit drugs.    Review of Systems   HYPOKALEMIA: Her potassium has been low consistently in the past Because of her persistent hypokalemia she was told to reduce her Tenoretic to only half a tablet Also she was stopped from taking a potassium supplement and started on Aldactone 50 mg daily  Potassium is back to normal now However her blood pressure is well-controlled also  Lab Results  Component Value Date   CREATININE 0.79 03/23/2015   BUN 17 03/23/2015   NA 137 03/23/2015   K 3.7 03/23/2015   CL 100 03/23/2015   CO2 28 03/23/2015         Lipids:  Has not been on medications for this and LDL is persistently mildly increased She does have family history of CVA but no known history of CAD       Lab Results  Component Value Date   CHOL 175 03/23/2015   HDL 55.60 03/23/2015   LDLCALC 101* 03/23/2015   LDLDIRECT 121.7 03/04/2012   TRIG 93.0 03/23/2015   CHOLHDL 3 03/23/2015    Last foot exam was in 7/15  Physical Examination:  BP 130/72 mmHg  Pulse 74  Temp(Src) 98.2 F (36.8 C) (Oral)  Resp 16  Ht 5\' 5"  (1.651 m)  Wt 157 lb (71.215 kg)  BMI 26.13 kg/m2  SpO2 95%  No pedal edema present    ASSESSMENT:  Diabetes type 2, uncontrolled   She has fair control of her diabetes with Amaryl and low dose metformin Last month her A1c was down below 7% Although her lab glucose was high she has not brought her monitor for review and not clear if blood sugars are still consistently controlled However she is able to lose some weight now and is trying to walk regularly  Hypertension: Blood pressure is relatively better today  Hypokalemia: This is controlled with Aldactone 50 mg daily along with reducing her  chlorthalidone to half tablet.  Not on potassium supplements now   HYPERLIPIDEMIA: Although this is mild and LDL is just over 100 discussed that she is at relatively high risk because of her diabetes and hypertension and needs a statin drug as recommended by guidelines Discussed benefits and possible side effects of statin drugs    PLAN:   For her diabetes and hypertension she will continue the same medications  she needs to start monitoring her blood sugars were consistently including some readings after meals and bring monitor for download Discussed blood sugar targets and she will call if they are consistently high Discussed adding protein to breakfast daily  She will start Lipitor 10 mg daily  Counseling time on subjects discussed above is over 50% of today's 25 minute visit   Bucky Grigg 05/10/2015, 9:06 PM   Note: This office note was prepared with Dragon voice recognition system technology. Any transcriptional errors that result from this process are unintentional.

## 2015-05-26 ENCOUNTER — Other Ambulatory Visit: Payer: Self-pay | Admitting: Family

## 2015-05-26 DIAGNOSIS — H524 Presbyopia: Secondary | ICD-10-CM | POA: Diagnosis not present

## 2015-05-26 DIAGNOSIS — H5203 Hypermetropia, bilateral: Secondary | ICD-10-CM | POA: Diagnosis not present

## 2015-05-26 DIAGNOSIS — H25813 Combined forms of age-related cataract, bilateral: Secondary | ICD-10-CM | POA: Diagnosis not present

## 2015-05-26 DIAGNOSIS — H52223 Regular astigmatism, bilateral: Secondary | ICD-10-CM | POA: Diagnosis not present

## 2015-05-26 DIAGNOSIS — E119 Type 2 diabetes mellitus without complications: Secondary | ICD-10-CM | POA: Diagnosis not present

## 2015-06-02 DIAGNOSIS — J301 Allergic rhinitis due to pollen: Secondary | ICD-10-CM | POA: Diagnosis not present

## 2015-06-02 DIAGNOSIS — J3089 Other allergic rhinitis: Secondary | ICD-10-CM | POA: Diagnosis not present

## 2015-06-06 ENCOUNTER — Ambulatory Visit (INDEPENDENT_AMBULATORY_CARE_PROVIDER_SITE_OTHER): Payer: Medicare Other | Admitting: Adult Health

## 2015-06-06 ENCOUNTER — Encounter: Payer: Self-pay | Admitting: Adult Health

## 2015-06-06 VITALS — BP 138/86 | Temp 98.5°F | Ht 65.0 in | Wt 163.7 lb

## 2015-06-06 DIAGNOSIS — M899 Disorder of bone, unspecified: Secondary | ICD-10-CM

## 2015-06-06 DIAGNOSIS — Z1239 Encounter for other screening for malignant neoplasm of breast: Secondary | ICD-10-CM

## 2015-06-06 DIAGNOSIS — Z23 Encounter for immunization: Secondary | ICD-10-CM | POA: Diagnosis not present

## 2015-06-06 DIAGNOSIS — M8580 Other specified disorders of bone density and structure, unspecified site: Secondary | ICD-10-CM

## 2015-06-06 DIAGNOSIS — Z7689 Persons encountering health services in other specified circumstances: Secondary | ICD-10-CM

## 2015-06-06 DIAGNOSIS — I10 Essential (primary) hypertension: Secondary | ICD-10-CM | POA: Diagnosis not present

## 2015-06-06 DIAGNOSIS — Z7189 Other specified counseling: Secondary | ICD-10-CM

## 2015-06-06 DIAGNOSIS — E1165 Type 2 diabetes mellitus with hyperglycemia: Secondary | ICD-10-CM

## 2015-06-06 DIAGNOSIS — IMO0002 Reserved for concepts with insufficient information to code with codable children: Secondary | ICD-10-CM

## 2015-06-06 DIAGNOSIS — M949 Disorder of cartilage, unspecified: Secondary | ICD-10-CM

## 2015-06-06 MED ORDER — ATENOLOL-CHLORTHALIDONE 50-25 MG PO TABS: 1.0000 | ORAL_TABLET | Freq: Every day | ORAL | Status: DC

## 2015-06-06 NOTE — Patient Instructions (Signed)
It was great meeting you today!   Follow up in 1-2 months for your next physical and please let me know if you need anything in the mean time.

## 2015-06-06 NOTE — Progress Notes (Signed)
Pre visit review using our clinic review tool, if applicable. No additional management support is needed unless otherwise documented below in the visit note. 

## 2015-06-06 NOTE — Progress Notes (Signed)
HPI:  Pam Obrien is here to establish care. She is a very pleasant AA female.  Last PCP and physical: " a couple of years" last was with Dr. Arnoldo Morale  Immunizations: She does not want the shingles vaccination Diet: She does not follow a specific diet. She is allergic to a lot of foods Exercise:Walks and dances Colonoscopy:04/2007 Dexa:Will make an appointment Pap Smear: No abnormal. Had hysterectomy in 1994 Mammogram:Will make appointment EYE: 05/2015   Has the following chronic problems that require follow up and concerns today:  Diabetes:   She is followed by Endocrinology for her diabetes management. Her last A1c was  Lab Results  Component Value Date   HGBA1C 6.8* 03/23/2015   She is currently taking Metformin 1000mg   BID, 2 units of insulin daily, Amaryl 4mg   HTN - She has been out of her Tenoretic for two weeks. She has been taking Aldactone and Tenorectic. Her blood pressure today in the office is 138/86.  ROS negative for unless reported above: fevers, chills,feeling poorly, unintentional weight loss, hearing or vision loss, chest pain, palpitations, leg claudication, struggling to breath,Not feeling congested in the chest, no orthopenia, no cough,no wheezing, normal appetite, no soft tissue swelling, no hemoptysis, melena, hematochezia, hematuria, falls, loc, si, or thoughts of self harm.   Past Medical History  Diagnosis Date  . Allergy   . Hyperlipidemia   . Arthritis   . Diabetes mellitus without complication   . Hypertension     Past Surgical History  Procedure Laterality Date  . Colonoscopy  2008  . Flexible sigmoidoscopy  2003  . Abdominal hysterectomy      Family History  Problem Relation Age of Onset  . Hypertension    . Liver cancer    . Stroke    . Diabetes Father   . Hypertension Father   . Stroke Father   . Heart disease Neg Hx     History   Social History  . Marital Status: Married    Spouse Name: N/A  . Number of  Children: N/A  . Years of Education: N/A   Social History Main Topics  . Smoking status: Never Smoker   . Smokeless tobacco: Never Used  . Alcohol Use: No  . Drug Use: No  . Sexual Activity: Not on file   Other Topics Concern  . None   Social History Narrative     Current outpatient prescriptions:  .  ACCU-CHEK SOFTCLIX LANCETS lancets, USE AS DIRECTED EVERY DAY, Disp: 100 each, Rfl: 1 .  atenolol-chlorthalidone (TENORETIC) 50-25 MG per tablet, Take 1 tablet by mouth daily., Disp: 90 tablet, Rfl: 0 .  atorvastatin (LIPITOR) 10 MG tablet, Take 1 tablet (10 mg total) by mouth daily., Disp: 30 tablet, Rfl: 3 .  calcium citrate-vitamin D (CITRACAL+D) 315-200 MG-UNIT per tablet, Take 1 tablet by mouth daily., Disp: , Rfl:  .  cholecalciferol (VITAMIN D) 1000 UNITS tablet, Take 2,000 Units by mouth daily., Disp: , Rfl:  .  FeFum-FePo-FA-B Cmp-C-Zn-Mn-Cu (SE-TAN PLUS) 162-115.2-1 MG CAPS, TAKE ONE CAPSULE BY MOUTH EVERY DAY, Disp: 90 capsule, Rfl: 3 .  glimepiride (AMARYL) 4 MG tablet, Take 1 tablet at supper and 1/2 tablet at bedtime, Disp: 45 tablet, Rfl: 3 .  glucose blood (ACCU-CHEK AVIVA PLUS) test strip, Use as instructed to check blood sugar 1 time per day dx code E11.65, Disp: 50 each, Rfl: 3 .  Insulin Pen Needle 32G X 4 MM MISC, Use 2 per day to inject insulin,  Disp: 100 each, Rfl: 3 .  loratadine (CLARITIN) 10 MG tablet, Take 1 tablet (10 mg total) by mouth 2 (two) times daily as needed for allergies., Disp: 180 tablet, Rfl: 3 .  metFORMIN (GLUCOPHAGE-XR) 500 MG 24 hr tablet, Take 2 tablets (1,000 mg total) by mouth daily with supper. (Patient taking differently: Take 1,000 mg by mouth daily with supper. ), Disp: 60 tablet, Rfl: 3 .  omeprazole (PRILOSEC) 20 MG capsule, Take 20 mg by mouth daily., Disp: , Rfl:  .  potassium chloride SA (K-DUR,KLOR-CON) 20 MEQ tablet, Take 2 tablets daily for the first week, then take 1 tablet daily, Disp: 105 tablet, Rfl: 1 .  spironolactone  (ALDACTONE) 50 MG tablet, Take 1 tablet (50 mg total) by mouth daily., Disp: 30 tablet, Rfl: 2  EXAM:  Filed Vitals:   06/06/15 0814  Temp: 98.5 F (36.9 C)    Body mass index is 27.24 kg/(m^2).  GENERAL: vitals reviewed and listed above, alert, oriented, appears well hydrated and in no acute distress.   HEENT: atraumatic, conjunttiva clear, no obvious abnormalities on inspection of external nose and ears. TM's visualzied, no cerumen impaction. Is wearing prescription glasses  NECK: Neck is soft and supple without masses, no adenopathy or thyromegaly, trachea midline, no JVD. Normal range of motion.   LUNGS: clear to auscultation bilaterally, no wheezes, rales or rhonchi, good air movement  CV: Regular rate and rhythm, normal S1/S2, no audible murmurs, gallops, or rubs. No carotid bruit and no peripheral edema.   MS: moves all extremities without noticeable abnormality. No edema noted  Abd: soft/nontender/nondistended/normal bowel sounds   Skin: warm and dry, no rash   Extremities: No clubbing, cyanosis, or edema. Capillary refill is WNL. Pulses intact bilaterally in upper and lower extremities.   Neuro: CN II-XII intact, sensation and reflexes normal throughout, 5/5 muscle strength in bilateral upper and lower extremities. Normal finger to nose. Normal rapid alternating movements.    PSYCH: pleasant and cooperative, no obvious depression or anxiety  ASSESSMENT AND PLAN:  1. Encounter to establish care - Follow up for CPE - Follow up sooner if needed - Exercise and heart healthy diet.   2. Essential hypertension  - atenolol-chlorthalidone (TENORETIC) 50-25 MG per tablet; Take 1 tablet by mouth daily.  Dispense: 90 tablet; Refill: 3  3. Type II diabetes mellitus, uncontrolled - Followed by Endocrinology   4. Breast cancer screening  - MM Digital Screening; Future  5. Disorder of bone and cartilage  - DG Bone Density; Future  6. Osteopenia  - DG Bone Density;  Future  7. Need for prophylactic vaccination against Streptococcus pneumoniae (pneumococcus)  - Pneumococcal conjugate vaccine 13-valent   -We reviewed the PMH, PSH, FH, SH, Meds and Allergies. -We provided refills for any medications we will prescribe as needed. -We addressed current concerns per orders and patient instructions. -We have asked for records for pertinent exams, studies, vaccines and notes from previous providers. -We have advised patient to follow up per instructions below.   -Patient advised to return or notify a provider immediately if symptoms worsen or persist or new concerns arise.  There are no Patient Instructions on file for this visit.   [name], AGNP

## 2015-07-06 DIAGNOSIS — H1045 Other chronic allergic conjunctivitis: Secondary | ICD-10-CM | POA: Diagnosis not present

## 2015-07-06 DIAGNOSIS — J3081 Allergic rhinitis due to animal (cat) (dog) hair and dander: Secondary | ICD-10-CM | POA: Diagnosis not present

## 2015-07-06 DIAGNOSIS — J3089 Other allergic rhinitis: Secondary | ICD-10-CM | POA: Diagnosis not present

## 2015-07-06 DIAGNOSIS — J301 Allergic rhinitis due to pollen: Secondary | ICD-10-CM | POA: Diagnosis not present

## 2015-08-07 ENCOUNTER — Encounter: Payer: Medicare Other | Admitting: Adult Health

## 2015-08-07 ENCOUNTER — Other Ambulatory Visit: Payer: Medicare Other

## 2015-08-07 ENCOUNTER — Ambulatory Visit (INDEPENDENT_AMBULATORY_CARE_PROVIDER_SITE_OTHER): Payer: Medicare Other | Admitting: Adult Health

## 2015-08-07 ENCOUNTER — Encounter: Payer: Self-pay | Admitting: Adult Health

## 2015-08-07 VITALS — BP 138/90 | HR 74 | Temp 98.4°F | Ht 65.0 in | Wt 157.0 lb

## 2015-08-07 DIAGNOSIS — IMO0002 Reserved for concepts with insufficient information to code with codable children: Secondary | ICD-10-CM

## 2015-08-07 DIAGNOSIS — E1165 Type 2 diabetes mellitus with hyperglycemia: Secondary | ICD-10-CM

## 2015-08-07 DIAGNOSIS — J301 Allergic rhinitis due to pollen: Secondary | ICD-10-CM | POA: Diagnosis not present

## 2015-08-07 DIAGNOSIS — Z0000 Encounter for general adult medical examination without abnormal findings: Secondary | ICD-10-CM | POA: Diagnosis not present

## 2015-08-07 DIAGNOSIS — I10 Essential (primary) hypertension: Secondary | ICD-10-CM | POA: Diagnosis not present

## 2015-08-07 DIAGNOSIS — E785 Hyperlipidemia, unspecified: Secondary | ICD-10-CM

## 2015-08-07 DIAGNOSIS — J3089 Other allergic rhinitis: Secondary | ICD-10-CM | POA: Diagnosis not present

## 2015-08-07 LAB — LIPID PANEL
Cholesterol: 156 mg/dL (ref 0–200)
HDL: 56.1 mg/dL (ref >39.00)
LDL Cholesterol: 83 mg/dL (ref 0–99)
NonHDL: 99.97
Total CHOL/HDL Ratio: 3
Triglycerides: 86 mg/dL (ref 0.0–149.0)
VLDL: 17.2 mg/dL (ref 0.0–40.0)

## 2015-08-07 LAB — CBC WITH DIFFERENTIAL/PLATELET
Basophils Absolute: 0 10*3/uL (ref 0.0–0.1)
Basophils Relative: 0.2 % (ref 0.0–3.0)
Eosinophils Absolute: 0.2 10*3/uL (ref 0.0–0.7)
Eosinophils Relative: 2.2 % (ref 0.0–5.0)
HCT: 40.1 % (ref 36.0–46.0)
Hemoglobin: 13.1 g/dL (ref 12.0–15.0)
Lymphocytes Relative: 36.7 % (ref 12.0–46.0)
Lymphs Abs: 3 10*3/uL (ref 0.7–4.0)
MCHC: 32.7 g/dL (ref 30.0–36.0)
MCV: 81.2 fl (ref 78.0–100.0)
Monocytes Absolute: 0.6 10*3/uL (ref 0.1–1.0)
Monocytes Relative: 7.6 % (ref 3.0–12.0)
Neutro Abs: 4.3 10*3/uL (ref 1.4–7.7)
Neutrophils Relative %: 53.3 % (ref 43.0–77.0)
Platelets: 223 10*3/uL (ref 150.0–400.0)
RBC: 4.94 Mil/uL (ref 3.87–5.11)
RDW: 12.4 % (ref 11.5–15.5)
WBC: 8.1 10*3/uL (ref 4.0–10.5)

## 2015-08-07 LAB — POCT URINALYSIS DIPSTICK
Bilirubin, UA: NEGATIVE
Blood, UA: NEGATIVE
Glucose, UA: NEGATIVE
Ketones, UA: NEGATIVE
Leukocytes, UA: NEGATIVE
Nitrite, UA: NEGATIVE
Spec Grav, UA: 1.025
Urobilinogen, UA: 0.2
pH, UA: 6

## 2015-08-07 LAB — COMPREHENSIVE METABOLIC PANEL
ALT: 19 U/L (ref 0–35)
AST: 17 U/L (ref 0–37)
Albumin: 4.2 g/dL (ref 3.5–5.2)
Alkaline Phosphatase: 104 U/L (ref 39–117)
BUN: 17 mg/dL (ref 6–23)
CO2: 29 mEq/L (ref 19–32)
Calcium: 9.5 mg/dL (ref 8.4–10.5)
Chloride: 96 mEq/L (ref 96–112)
Creatinine, Ser: 0.68 mg/dL (ref 0.40–1.20)
GFR: 109.78 mL/min (ref >60.00)
Glucose, Bld: 286 mg/dL — ABNORMAL HIGH (ref 70–99)
Potassium: 3.1 mEq/L — ABNORMAL LOW (ref 3.5–5.1)
Sodium: 139 mEq/L (ref 135–145)
Total Bilirubin: 0.5 mg/dL (ref 0.2–1.2)
Total Protein: 7.6 g/dL (ref 6.0–8.3)

## 2015-08-07 LAB — HEMOGLOBIN A1C: Hgb A1c MFr Bld: 8.2 % — ABNORMAL HIGH (ref 4.6–6.5)

## 2015-08-07 NOTE — Patient Instructions (Addendum)
  Pam Obrien , Thank you for taking time to come for your Medicare Wellness Visit. I appreciate your ongoing commitment to your health goals. Please review the following plan we discussed and let me know if I can assist you in the future.   These are the goals we discussed: Goals    . Prevent Falls    . Reduce sugar intake to X grams per day       This is a list of the screening recommended for you and due dates:  Health Maintenance  Topic Date Due  .  Hepatitis C: One time screening is recommended by Center for Disease Control  (CDC) for  adults born from [birth year] through 1966.   June 17, 1946  . Shingles Vaccine  11/20/2004  . Mammogram  03/04/2011  . Complete foot exam   05/28/2015  . Flu Shot  12/07/2019*  . Hemoglobin A1C  09/23/2015  . Urine Protein Check  02/06/2016  . Eye exam for diabetics  05/25/2016  . Tetanus Vaccine  11/11/2016  . Colon Cancer Screening  04/27/2017  . DEXA scan (bone density measurement)  Completed  . Pneumonia vaccines  Completed  *Topic was postponed. The date shown is not the original due date.    It was great seeing you again!  Continue to eat healthy and exercise.   Please start taking your Spirolactone and Metformin as soon as you can. If you run into any trouble financially, please let us know.   I will follow up with you regarding your blood work today.   Follow up in one year or sooner if needed

## 2015-08-07 NOTE — Progress Notes (Signed)
Pre visit review using our clinic review tool, if applicable. No additional management support is needed unless otherwise documented below in the visit note. 

## 2015-08-07 NOTE — Progress Notes (Signed)
Subjective:  Patient presents today for their annual wellness visit.    Patient presents for yearly preventative medicine examination. Medicare questionnaire was completed  All immunizations and health maintenance protocols were reviewed with the patient and needed orders were placed.  Appropriate screening laboratory values were ordered for the patient including screening of hyperlipidemia, renal function and hepatic function. If indicated by BPH, a PSA was ordered.  Medication reconciliation,  past medical history, social history, problem list and allergies were reviewed in detail with the patient  Goals were established with regard to weight loss, exercise, and  diet in compliance with medications  End of life planning was discussed.  She has not been taking Metformin and Spirolactone for the last 2-3 weeks due to financial issues. Her blood pressure was slightly elevated today at 138/90, but she has not taken any medications today. She also has not been checking her blood sugars at home due to running out of testing supplies. She does see Dr. Dwyane Dee today for a diabetes follow up.   Preventive Screening-Counseling & Management  Smoking Status:Never Smoker Second Hand Smoking status: No smokers in home  Risk Factors Regular exercise: Walks Diet: Tries and eats healthy Fall Risk: None   Cardiac risk factors:  advanced age (older than 79 for men, 47 for women)  Hyperlipidemia   diabetes.  Family History:Yes  Depression Screen None. PHQ2 0   Activities of Daily Living  Independent ADLs and IADLs  Hearing Difficulties: Patient declines  Cognitive Testing No reported trouble.   Normal 3 word recall  List the Names of Other Physician/Practitioners you currently use: 1.Dr. Dwyane Dee - Endocrine 2. Optho- Dr. Sabra Heck  .  Immunization History  Administered Date(s) Administered  . Influenza Split 09/03/2012  . Pneumococcal Conjugate-13 06/06/2015  .  Pneumococcal Polysaccharide-23 12/28/2009  . Td 11/11/2006   Required Immunizations needed today Needs flu shot, but does not want it   Screening tests- up to date Health Maintenance Due  Topic Date Due  . Hepatitis C Screening  1946-09-24  . ZOSTAVAX  11/20/2004  . MAMMOGRAM  03/04/2011  . FOOT EXAM  05/28/2015    ROS- No pertinent positives discovered in course of AWV  The following were reviewed and entered/updated in epic: Past Medical History  Diagnosis Date  . Allergy   . Hyperlipidemia   . Arthritis   . Diabetes mellitus without complication   . Hypertension    Patient Active Problem List   Diagnosis Date Noted  . Other and unspecified hyperlipidemia 07/21/2014  . Type II diabetes mellitus, uncontrolled 01/04/2013  . ESOPHAGEAL REFLUX 06/18/2010  . HYPOKALEMIA, MILD 06/02/2009  . Disorder of bone and cartilage 12/11/2007  . COLONIC POLYPS, HX OF 12/11/2007  . Essential hypertension 06/23/2007  . ALLERGIC RHINITIS 06/23/2007  . OSTEOARTHRITIS 06/23/2007   Past Surgical History  Procedure Laterality Date  . Colonoscopy  2008  . Flexible sigmoidoscopy  2003  . Abdominal hysterectomy  1994    Family History  Problem Relation Age of Onset  . Hypertension Sister   . Stroke    . Diabetes Father   . Hypertension Father   . Stroke Father   . Heart disease Neg Hx   . Diabetes Sister   . Diabetes Brother   . Hypertension Brother   . Stroke Brother     Medications- reviewed and updated Current Outpatient Prescriptions  Medication Sig Dispense Refill  . ACCU-CHEK SOFTCLIX LANCETS lancets USE AS DIRECTED EVERY DAY 100 each 1  .  atenolol-chlorthalidone (TENORETIC) 50-25 MG per tablet Take 1 tablet by mouth daily. 90 tablet 3  . atorvastatin (LIPITOR) 10 MG tablet Take 1 tablet (10 mg total) by mouth daily. (Patient not taking: Reported on 06/06/2015) 30 tablet 3  . calcium citrate-vitamin D (CITRACAL+D) 315-200 MG-UNIT per tablet Take 1 tablet by mouth daily.     . cholecalciferol (VITAMIN D) 1000 UNITS tablet Take 2,000 Units by mouth daily.    Marland Kitchen FeFum-FePo-FA-B Cmp-C-Zn-Mn-Cu (SE-TAN PLUS) 162-115.2-1 MG CAPS TAKE ONE CAPSULE BY MOUTH EVERY DAY 90 capsule 3  . glimepiride (AMARYL) 4 MG tablet Take 1 tablet at supper and 1/2 tablet at bedtime (Patient taking differently: Take 1 tablet at bedtime) 45 tablet 3  . glucose blood (ACCU-CHEK AVIVA PLUS) test strip Use as instructed to check blood sugar 1 time per day dx code E11.65 50 each 3  . Insulin Pen Needle 32G X 4 MM MISC Use 2 per day to inject insulin 100 each 3  . loratadine (CLARITIN) 10 MG tablet Take 1 tablet (10 mg total) by mouth 2 (two) times daily as needed for allergies. 180 tablet 3  . metFORMIN (GLUCOPHAGE-XR) 500 MG 24 hr tablet Take 2 tablets (1,000 mg total) by mouth daily with supper. (Patient taking differently: Take 1,000 mg by mouth daily with supper. ) 60 tablet 3  . omeprazole (PRILOSEC) 20 MG capsule Take 20 mg by mouth daily.    Marland Kitchen spironolactone (ALDACTONE) 50 MG tablet Take 1 tablet (50 mg total) by mouth daily. 30 tablet 2   No current facility-administered medications for this visit.    Allergies-reviewed and updated Allergies  Allergen Reactions  . Aspirin     REACTION: nervous  . Beef-Derived Products   . Celecoxib     REACTION: joints swell  . Milk-Related Compounds   . Mold Extract [Trichophyton]   . Nsaids     REACTION: sweling  . Peanuts [Peanut Oil]   . Shellfish Allergy   . Sulfamethoxazole     REACTION: urticaria (hives)    Social History   Social History  . Marital Status: Married    Spouse Name: N/A  . Number of Children: N/A  . Years of Education: N/A   Social History Main Topics  . Smoking status: Never Smoker   . Smokeless tobacco: Never Used  . Alcohol Use: No  . Drug Use: No  . Sexual Activity: Not Asked   Other Topics Concern  . None   Social History Narrative   Retired from [employer] work    Separated for 9 years.    Five  children, two live locally, three are in [place]   One of her daughters live with her   Has a dog.    She likes to dance and walk    Objective: BP 138/90 mmHg  Pulse 74  Temp(Src) 98.4 F (36.9 C) (Oral)  Ht 5\' 5"  (1.651 m)  Wt 157 lb (71.215 kg)  BMI 26.13 kg/m2  SpO2 96% GENERAL: vitals reviewed and listed above, alert, oriented, appears well hydrated and in no acute distress.   HEENT: atraumatic, conjunttiva clear, no obvious abnormalities on inspection of external nose and ears. TM's visualzied, slight cerumen impaction. Is wearing prescription glasses  NECK: Neck is soft and supple without masses, no adenopathy or thyromegaly, trachea midline, no JVD. Normal range of motion.   LUNGS: clear to auscultation bilaterally, no wheezes, rales or rhonchi, good air movement  CV: Regular rate and rhythm, normal S1/S2, soft heart murmur,no  gallops or rubs. No carotid bruit and no peripheral edema.   MS: moves all extremities without noticeable abnormality. No edema noted  Abd: soft/nontender/nondistended/normal bowel sounds   Skin: warm and dry, no rash   Extremities: No clubbing, cyanosis, or edema. Capillary refill is WNL. Pulses intact bilaterally in upper and lower extremities.   Neuro: CN II-XII intact, sensation and reflexes normal throughout, 5/5 muscle strength in bilateral upper and lower extremities.   PSYCH: pleasant and cooperative, no obvious depression or anxiety  Assessment/Plan:  1. Essential hypertension - Take all medications - Hepatic function panel - POCT urinalysis dipstick - CBC with Differential/Platelet  2. Type II diabetes mellitus, uncontrolled - Follow up with Dr. Dwyane Dee today - Continue to work on diet and increase the amount of exercise - Hepatic function panel - POCT urinalysis dipstick - CBC with Differential/Platelet  3. Encounter for Medicare annual wellness exam - Follow up in one year for MWE - Follow up sooner if needed - Continue  to eat healthy and exercise  4. Hyperlipidemia - Hepatic function panel - POCT urinalysis dipstick - CBC with Differential/Platelet    Return precautions advised.    [name] , Thank you for taking time to come for your Medicare Wellness Visit. I appreciate your ongoing commitment to your health goals. Please review the following plan we discussed and let me know if I can assist you in the future.   These are the goals we discussed: Goals    . Prevent Falls    . Reduce sugar intake to X grams per day       This is a list of the screening recommended for you and due dates:  Health Maintenance  Topic Date Due  .  Hepatitis C: One time screening is recommended by Center for Disease Control  (CDC) for  adults born from [birth year] through 1966.   [birth year]  . Shingles Vaccine  11/20/2004  . Mammogram  03/04/2011  . Complete foot exam   05/28/2015  . Flu Shot  12/07/2019*  . Hemoglobin A1C  09/23/2015  . Urine Protein Check  02/06/2016  . Eye exam for diabetics  05/25/2016  . Tetanus Vaccine  11/11/2016  . Colon Cancer Screening  04/27/2017  . DEXA scan (bone density measurement)  Completed  . Pneumonia vaccines  Completed  *Topic was postponed. The date shown is not the original due date.

## 2015-08-08 ENCOUNTER — Other Ambulatory Visit: Payer: Self-pay | Admitting: Adult Health

## 2015-08-08 ENCOUNTER — Telehealth: Payer: Self-pay | Admitting: Adult Health

## 2015-08-08 DIAGNOSIS — E876 Hypokalemia: Secondary | ICD-10-CM

## 2015-08-08 MED ORDER — POTASSIUM CHLORIDE CRYS ER 20 MEQ PO TBCR: 20.0000 meq | EXTENDED_RELEASE_TABLET | Freq: Every day | ORAL | Status: DC

## 2015-08-08 NOTE — Telephone Encounter (Signed)
I have sent in a prescription for 20 meq Kdur. I would like her to take these daily for two weeks and then come back and get her labs done.

## 2015-08-08 NOTE — Telephone Encounter (Signed)
Called and left voicemail for patient to call office back  

## 2015-08-10 ENCOUNTER — Ambulatory Visit (INDEPENDENT_AMBULATORY_CARE_PROVIDER_SITE_OTHER): Payer: Medicare Other | Admitting: Endocrinology

## 2015-08-10 ENCOUNTER — Encounter: Payer: Self-pay | Admitting: Endocrinology

## 2015-08-10 VITALS — BP 134/78 | HR 72 | Temp 98.2°F | Resp 14 | Ht 65.0 in | Wt 156.8 lb

## 2015-08-10 DIAGNOSIS — E785 Hyperlipidemia, unspecified: Secondary | ICD-10-CM

## 2015-08-10 DIAGNOSIS — IMO0002 Reserved for concepts with insufficient information to code with codable children: Secondary | ICD-10-CM

## 2015-08-10 DIAGNOSIS — E876 Hypokalemia: Secondary | ICD-10-CM | POA: Diagnosis not present

## 2015-08-10 DIAGNOSIS — E1165 Type 2 diabetes mellitus with hyperglycemia: Secondary | ICD-10-CM | POA: Diagnosis not present

## 2015-08-10 MED ORDER — ACCU-CHEK SOFTCLIX LANCETS MISC: Status: DC

## 2015-08-10 NOTE — Telephone Encounter (Signed)
Left another voicemail for patient to call back.

## 2015-08-10 NOTE — Progress Notes (Signed)
Patient ID: Pam Obrien, female   DOB: [birth year], 69 y.o.   MRN: [id number]    Reason for Appointment: Followup for Type 2 Diabetes   History of Present Illness:          Diagnosis: Type 2 diabetes mellitus, date of diagnosis:  4/13      Past history:  She was initially started on metformin but he thinks that this made her sleepy and had some abdominal discomfort   She also has been tried on other oral hypoglycemic regimens including Jentadueto  since 2014  But she had similar side effects with this  And not clear how regularly she had been taking this until it was discontinued in 5/15  A1c appears to be persistently over 8% Since 2014  She was tried on Invokamet by her PCP but because of excessive urination with this and also some nausea and weakness it was stopped With glyburide alone her blood sugars are poorly controlled and she was referred here for further management She was given Januvia but she did not take this because of expense of the medication   She was referred to the nurse educator and started on basal insulin to improve her control on 06/07/14 This was changed to premixed insulin in 9/15 because of continued poor control and her not being able to afford NovoLog mix insulin or Invokana  She stopped taking insulin in 1/16 because of the cost and was taking only Amaryl.  Recent history:  Her blood sugars are poorly controlled now because of her running out of medications, she says she did not have a co-pay for her medications Also has not checked her blood sugar in almost 2 months as she could not afford the lancets Lab glucose was 286  She has gradually lost weight   She is taking her medications as below, cannot tolerate more than 500 mg of metformin daily She has tried to be fairly good with her walking routine and she thinks her diet is reasonably good.,  Only occasionally will have some sweets  Oral hypoglycemic drugs the patient is taking are:  Glimepiride  2 mg at supper and 4 mg at bedtime, metformin ER 500 mg in pm     Side effects from medications have been: Metformin causes  and abdominal discomfort Compliance with the medical regimen: fair Glucose monitoring with Accu-Chek meter:  no readings since 06/14/15   Previous fasting range: 107-139 and nonfasting 94-181 with overall average 133   Glycemic control:   Lab Results  Component Value Date   HGBA1C 8.2* 08/07/2015   HGBA1C 6.8* 03/23/2015   HGBA1C 7.5* 02/06/2015   Lab Results  Component Value Date   MICROALBUR 5.3* 02/06/2015   LDLCALC 83 08/07/2015   CREATININE 0.68 08/07/2015    Self-care: The diet that the patient has been following is: tries to limit  portions      Meals:  usually 2-3 meals per day. Usually has oatmeal in the morning, sometimes will have only fruit at lunchtime, may have rice or other carbohydrates at dinner time, will have fruit for snacks   .        Exercise: walking 6/7 days a week, 1 mile       Dietician visit: Most recent:3/14.              CDE visit last in 7/15  Retinal exam: Most recent:2014  With the optometrist.    Weight history:  Previous range 160-172   Wt  Readings from Last 3 Encounters:  08/10/15 156 lb 12.8 oz (71.124 kg)  08/07/15 157 lb (71.215 kg)  06/06/15 163 lb 11.2 oz (74.254 kg)      Medication List       This list is accurate as of: 08/10/15  1:34 PM.  Always use your most recent med list.               ACCU-CHEK SOFTCLIX LANCETS lancets  USE AS DIRECTED EVERY DAY     atenolol-chlorthalidone 50-25 MG tablet  Commonly known as:  TENORETIC  Take 1 tablet by mouth daily.     atorvastatin 10 MG tablet  Commonly known as:  LIPITOR  Take 1 tablet (10 mg total) by mouth daily.     calcium citrate-vitamin D 315-200 MG-UNIT tablet  Commonly known as:  CITRACAL+D  Take 1 tablet by mouth daily.     cholecalciferol 1000 UNITS tablet  Commonly known as:  VITAMIN D  Take 2,000 Units by mouth daily.       glimepiride 4 MG tablet  Commonly known as:  AMARYL  Take 1 tablet at supper and 1/2 tablet at bedtime     glucose blood test strip  Commonly known as:  ACCU-CHEK AVIVA PLUS  Use as instructed to check blood sugar 1 time per day dx code E11.65     Insulin Pen Needle 32G X 4 MM Misc  Use 2 per day to inject insulin     loratadine 10 MG tablet  Commonly known as:  CLARITIN  Take 1 tablet (10 mg total) by mouth 2 (two) times daily as needed for allergies.     metFORMIN 500 MG 24 hr tablet  Commonly known as:  GLUCOPHAGE-XR  Take 2 tablets (1,000 mg total) by mouth daily with supper.     omeprazole 20 MG capsule  Commonly known as:  PRILOSEC  Take 20 mg by mouth daily.     potassium chloride SA 20 MEQ tablet  Commonly known as:  K-DUR,KLOR-CON  Take 1 tablet (20 mEq total) by mouth daily.     SE-TAN PLUS 162-115.2-1 MG Caps  TAKE ONE CAPSULE BY MOUTH EVERY DAY     spironolactone 50 MG tablet  Commonly known as:  ALDACTONE  Take 1 tablet (50 mg total) by mouth daily.        Allergies:  Allergies  Allergen Reactions  . Aspirin     REACTION: nervous  . Beef-Derived Products   . Celecoxib     REACTION: joints swell  . Milk-Related Compounds   . Mold Extract [Trichophyton]   . Nsaids     REACTION: sweling  . Peanuts [Peanut Oil]   . Shellfish Allergy   . Sulfamethoxazole     REACTION: urticaria (hives)    Past Medical History  Diagnosis Date  . Allergy   . Hyperlipidemia   . Arthritis   . Diabetes mellitus without complication   . Hypertension     Past Surgical History  Procedure Laterality Date  . Colonoscopy  2008  . Flexible sigmoidoscopy  2003  . Abdominal hysterectomy  1994    Family History  Problem Relation Age of Onset  . Hypertension Sister   . Stroke    . Diabetes Father   . Hypertension Father   . Stroke Father   . Heart disease Neg Hx   . Diabetes Sister   . Diabetes Brother   . Hypertension Brother   . Stroke Brother  Social History:  reports that she has never smoked. She has never used smokeless tobacco. She reports that she does not drink alcohol or use illicit drugs.    Review of Systems   HYPOKALEMIA: Her potassium has been low consistently in the past She had previously done well with Aldactone 50 mg but not clear if she has taken this  Lab Results  Component Value Date   CREATININE 0.68 08/07/2015   BUN 17 08/07/2015   NA 139 08/07/2015   K 3.1* 08/07/2015   CL 96 08/07/2015   CO2 29 08/07/2015        Lipids: She did start Lipitor 10 mg as directed for mild hyperlipidemia as well as other risk factors She does have family history of CVA but no known history of CAD She thinks it is causing constipation but reassured her that it is unlikely.  LDL is better       Lab Results  Component Value Date   CHOL 156 08/07/2015   HDL 56.10 08/07/2015   LDLCALC 83 08/07/2015   LDLDIRECT 121.7 03/04/2012   TRIG 86.0 08/07/2015   CHOLHDL 3 08/07/2015    Last foot exam was in 7/15  Physical Examination:  BP 134/78 mmHg  Pulse 72  Temp(Src) 98.2 F (36.8 C)  Resp 14  Ht 5\' 5"  (1.651 m)  Wt 156 lb 12.8 oz (71.124 kg)  BMI 26.09 kg/m2  SpO2 95%  No pedal edema present    ASSESSMENT:  Diabetes type 2, uncontrolled   She has previously had fair control of her diabetes with Amaryl and low dose metformin Now because of her noncompliance medications are blood sugars are out of control, lab reading 286 and A1c also increased She is also not monitored her blood sugar as she claims she could not pay for her lancets  However she is able to lose some weight now and is trying to walk regularly  Hypertension: Blood pressure is controlled  Hypokalemia: This previously was controlled with Aldactone 50 mg daily but she probably has not taken some of her medications regularly and advised her to do so She has been recommended potassium supplement by PCP for 2 weeks   HYPERLIPIDEMIA: Better  controlled with starting Lipitor, reassured her that it is not causing constipation   PLAN:   Start checking blood sugars especially after meals No change in medications as yet but need to see her in 6 weeks to make sure blood sugars are consistently controlled May well need insulin if her blood sugars continue to be high  She will take the course of potassium as directed and continue Aldactone We'll recheck potassium in 6 weeks  Continue Lipitor   Jakayla Schweppe 08/10/2015, 1:34 PM   Note: This office note was prepared with Dragon voice recognition system technology. Any transcriptional errors that result from this process are unintentional.

## 2015-08-10 NOTE — Patient Instructions (Signed)
Check blood sugars on waking up .. 2-3 .. times a week  Also check blood sugars about 2 hours after a meal and do this after different meals by rotation  Recommended blood sugar levels on waking up is 90-130 and about 2 hours after meal is 140-180 Please bring blood sugar monitor to each visit.  

## 2015-08-14 NOTE — Telephone Encounter (Signed)
Patient aware. Appointment schedule for CMP on 10/17

## 2015-08-28 ENCOUNTER — Other Ambulatory Visit (INDEPENDENT_AMBULATORY_CARE_PROVIDER_SITE_OTHER): Payer: Medicare Other

## 2015-08-28 DIAGNOSIS — E876 Hypokalemia: Secondary | ICD-10-CM | POA: Diagnosis not present

## 2015-08-28 LAB — COMPREHENSIVE METABOLIC PANEL
ALT: 11 U/L (ref 0–35)
AST: 15 U/L (ref 0–37)
Albumin: 4.5 g/dL (ref 3.5–5.2)
Alkaline Phosphatase: 121 U/L — ABNORMAL HIGH (ref 39–117)
BUN: 22 mg/dL (ref 6–23)
CO2: 29 mEq/L (ref 19–32)
Calcium: 9.8 mg/dL (ref 8.4–10.5)
Chloride: 100 mEq/L (ref 96–112)
Creatinine, Ser: 1.14 mg/dL (ref 0.40–1.20)
GFR: 60.47 mL/min (ref >60.00)
Glucose, Bld: 158 mg/dL — ABNORMAL HIGH (ref 70–99)
Potassium: 4.5 mEq/L (ref 3.5–5.1)
Sodium: 141 mEq/L (ref 135–145)
Total Bilirubin: 0.5 mg/dL (ref 0.2–1.2)
Total Protein: 7.9 g/dL (ref 6.0–8.3)

## 2015-09-04 ENCOUNTER — Other Ambulatory Visit: Payer: Self-pay | Admitting: Endocrinology

## 2015-09-21 ENCOUNTER — Ambulatory Visit: Payer: Medicare Other | Admitting: Endocrinology

## 2015-09-22 ENCOUNTER — Other Ambulatory Visit: Payer: Self-pay | Admitting: *Deleted

## 2015-09-22 MED ORDER — SE-TAN PLUS 162-115.2-1 MG PO CAPS: 1.0000 | ORAL_CAPSULE | Freq: Every day | ORAL | Status: DC

## 2015-09-25 DIAGNOSIS — J301 Allergic rhinitis due to pollen: Secondary | ICD-10-CM | POA: Diagnosis not present

## 2015-09-25 DIAGNOSIS — J3089 Other allergic rhinitis: Secondary | ICD-10-CM | POA: Diagnosis not present

## 2015-09-28 ENCOUNTER — Ambulatory Visit (INDEPENDENT_AMBULATORY_CARE_PROVIDER_SITE_OTHER): Payer: Medicare Other | Admitting: Endocrinology

## 2015-09-28 ENCOUNTER — Encounter: Payer: Self-pay | Admitting: Endocrinology

## 2015-09-28 VITALS — BP 130/76 | HR 83 | Temp 98.1°F | Resp 14 | Ht 65.0 in | Wt 155.4 lb

## 2015-09-28 DIAGNOSIS — E876 Hypokalemia: Secondary | ICD-10-CM

## 2015-09-28 DIAGNOSIS — IMO0002 Reserved for concepts with insufficient information to code with codable children: Secondary | ICD-10-CM

## 2015-09-28 DIAGNOSIS — E1165 Type 2 diabetes mellitus with hyperglycemia: Secondary | ICD-10-CM | POA: Diagnosis not present

## 2015-09-28 LAB — BASIC METABOLIC PANEL
BUN: 23 mg/dL (ref 6–23)
CO2: 28 mEq/L (ref 19–32)
Calcium: 9.8 mg/dL (ref 8.4–10.5)
Chloride: 99 mEq/L (ref 96–112)
Creatinine, Ser: 0.92 mg/dL (ref 0.40–1.20)
GFR: 77.42 mL/min (ref >60.00)
Glucose, Bld: 198 mg/dL — ABNORMAL HIGH (ref 70–99)
Potassium: 4 mEq/L (ref 3.5–5.1)
Sodium: 138 mEq/L (ref 135–145)

## 2015-09-28 NOTE — Progress Notes (Signed)
Patient ID: Pam Obrien, female   DOB: Apr 17, 1946, 69 y.o.   MRN: [id number]    Reason for Appointment: Followup for Type 2 Diabetes   History of Present Illness:          Diagnosis: Type 2 diabetes mellitus, date of diagnosis:  4/13      Past history:  She was initially started on metformin but he thinks that this made her sleepy and had some abdominal discomfort   She also has been tried on other oral hypoglycemic regimens including Jentadueto  since 2014  But she had similar side effects with this  And not clear how regularly she had been taking this until it was discontinued in 5/15  A1c appears to be persistently over 8% Since 2014  She was tried on Invokamet by her PCP but because of excessive urination with this and also some nausea and weakness it was stopped With glyburide alone her blood sugars are poorly controlled and she was referred here for further management She was given Januvia but she did not take this because of expense of the medication   She was referred to the nurse educator and started on basal insulin to improve her control on 06/07/14 This was changed to premixed insulin in 9/15 because of continued poor control and her not being able to afford NovoLog mix insulin or Invokana  She stopped taking insulin in 1/16 because of the cost and was taking only Amaryl.  Recent history:   Her blood sugars on the last visit werepoorly controlled now because of her running out of medications She also had not been monitoring her glucose  Current blood sugar patterns and problems identified:  She says she has been taking her metformin and Amaryl as directed, still taking only 500 mg metformin since higher doses cause abdominal discomfort.  Her monitor has no battery and difficult to know what her blood sugar patterns are  She had labs last month and glucose was 158 nonfasting  She reports fairly good blood sugars at home, usually under 150 but not usually  checking after dinner  She did have one episode of hypoglycemia when she was late for lunch She says she has been trying to be compliant with diet and walking program  Glucose monitoring with Accu-Chek meter:    Recent blood sugars by recall: Am around 128, upto 150 acs. Pcs   Oral hypoglycemic drugs the patient is taking are: Glimepiride  2 mg at supper and 4 mg at bedtime, metformin ER 500 mg in pm     Side effects from medications have been: Metformin causes  and abdominal discomfort Compliance with the medical regimen: fair   Glycemic control:   Lab Results  Component Value Date   HGBA1C 8.2* 08/07/2015   HGBA1C 6.8* 03/23/2015   HGBA1C 7.5* 02/06/2015   Lab Results  Component Value Date   MICROALBUR 5.3* 02/06/2015   LDLCALC 83 08/07/2015   CREATININE 1.14 08/28/2015    Self-care: The diet that the patient has been following is: tries to limit  portions      Meals:  usually 2-3 meals per day. Usually has oatmeal in the morning, sometimes will have only fruit at lunchtime, may have rice or other carbohydrates at dinner time, will have fruit for snacks   .        Exercise: walking several days a week, 1 mile       Dietician visit: Most recent:3/14.  CDE visit last in 7/15     Weight history:  Previous range 160-172   Wt Readings from Last 3 Encounters:  09/28/15 155 lb 6.4 oz (70.489 kg)  08/10/15 156 lb 12.8 oz (71.124 kg)  08/07/15 157 lb (71.215 kg)      Medication List       This list is accurate as of: 09/28/15 11:54 AM.  Always use your most recent med list.               ACCU-CHEK SOFTCLIX LANCETS lancets  USE AS DIRECTED EVERY DAY     atenolol-chlorthalidone 50-25 MG tablet  Commonly known as:  TENORETIC  Take 1 tablet by mouth daily.     atorvastatin 10 MG tablet  Commonly known as:  LIPITOR  Take 1 tablet (10 mg total) by mouth daily.     calcium citrate-vitamin D 315-200 MG-UNIT tablet  Commonly known as:  CITRACAL+D  Take  1 tablet by mouth daily.     cholecalciferol 1000 UNITS tablet  Commonly known as:  VITAMIN D  Take 2,000 Units by mouth daily.     glimepiride 4 MG tablet  Commonly known as:  AMARYL  TAKE 1 TABLET BY MOUTH AT DINNER AND 1/2 TABLET AT BEDTIME     glucose blood test strip  Commonly known as:  ACCU-CHEK AVIVA PLUS  Use as instructed to check blood sugar 1 time per day dx code E11.65     Insulin Pen Needle 32G X 4 MM Misc  Use 2 per day to inject insulin     loratadine 10 MG tablet  Commonly known as:  CLARITIN  Take 1 tablet (10 mg total) by mouth 2 (two) times daily as needed for allergies.     metFORMIN 500 MG 24 hr tablet  Commonly known as:  GLUCOPHAGE-XR  TAKE 2 TABLETS EVERY DAY WITH SUPPER     omeprazole 20 MG capsule  Commonly known as:  PRILOSEC  Take 20 mg by mouth daily.     SE-TAN PLUS 162-115.2-1 MG Caps  Take 1 capsule by mouth daily.     spironolactone 50 MG tablet  Commonly known as:  ALDACTONE  TAKE 1 TABLET EVERY DAY        Allergies:  Allergies  Allergen Reactions  . Aspirin     REACTION: nervous  . Beef-Derived Products   . Celecoxib     REACTION: joints swell  . Milk-Related Compounds   . Mold Extract [Trichophyton]   . Nsaids     REACTION: sweling  . Peanuts [Peanut Oil]   . Shellfish Allergy   . Sulfamethoxazole     REACTION: urticaria (hives)    Past Medical History  Diagnosis Date  . Allergy   . Hyperlipidemia   . Arthritis   . Diabetes mellitus without complication ([place])   . Hypertension     Past Surgical History  Procedure Laterality Date  . Colonoscopy  2008  . Flexible sigmoidoscopy  2003  . Abdominal hysterectomy  1994    Family History  Problem Relation Age of Onset  . Hypertension Sister   . Stroke    . Diabetes Father   . Hypertension Father   . Stroke Father   . Heart disease Neg Hx   . Diabetes Sister   . Diabetes Brother   . Hypertension Brother   . Stroke Brother     Social History:  reports  that she has never smoked. She has never used smokeless tobacco.  She reports that she does not drink alcohol or use illicit drugs.    Review of Systems   Retinal exam: Most recent: 6/16  With the optometrist.  HYPOKALEMIA: Her potassium has been low consistently in the past This is better controlled with her taking Aldactone 50 mg regularly  Lab Results  Component Value Date   CREATININE 1.14 08/28/2015   BUN 22 08/28/2015   NA 141 08/28/2015   K 4.5 08/28/2015   CL 100 08/28/2015   CO2 29 08/28/2015        Lipids: She is on Lipitor 10 mg as directed for mild hyperlipidemia as well as other risk factors She does have family history of CVA but no known history of CAD       Lab Results  Component Value Date   CHOL 156 08/07/2015   HDL 56.10 08/07/2015   LDLCALC 83 08/07/2015   LDLDIRECT 121.7 03/04/2012   TRIG 86.0 08/07/2015   CHOLHDL 3 08/07/2015    Last foot exam was in 09/2015  Physical Examination:  BP 130/76 mmHg  Pulse 83  Temp(Src) 98.1 F (36.7 C)  Resp 14  Ht 5\' 5"  (1.651 m)  Wt 155 lb 6.4 oz (70.489 kg)  BMI 25.86 kg/m2  SpO2 95%  No pedal edema present Diabetic foot exam shows normal monofilament sensation in the toes and plantar surfaces, no skin lesions or ulcers on the feet and normal pedal pulses     ASSESSMENT:  Diabetes type 2, uncontrolled   She has  had fairly good control of her diabetes with Amaryl and low dose metformin See history of present illness for detailed discussion of his current management, blood sugar patterns and problems identified With getting back on her medication regimen her glucose readings appear to be fairly good although unable to review her home blood sugar readings today; lab glucose last month was fairly good  Hypertension: Blood pressure is controlled  Hypokalemia: Controlled with Aldactone 50 mg   PLAN:   Start monitoring blood sugars again, she will get a new battery for her meter. Discussed timing and  targets of blood sugars Avoid skipping or being late for meals Consistent exercise Check A1c on the next visit  Patient Instructions  Check blood sugars on waking up 2-3  times a week Also check blood sugars about 2 hours after a meal and do this after different meals by rotation  Recommended blood sugar levels on waking up is 90-130 and about 2 hours after meal is 130-160  Please bring your blood sugar monitor to each visit, thank you       [facility] 09/28/2015, 11:54 AM   Note: This office note was prepared with Dragon voice recognition system technology. Any transcriptional errors that result from this process are unintentional.

## 2015-09-28 NOTE — Patient Instructions (Signed)
Check blood sugars on waking up 2-3 times a week Also check blood sugars about 2 hours after a meal and do this after different meals by rotation  Recommended blood sugar levels on waking up is 90-130 and about 2 hours after meal is 130-160  Please bring your blood sugar monitor to each visit, thank you  

## 2015-09-29 LAB — FRUCTOSAMINE: Fructosamine: 314 umol/L — ABNORMAL HIGH (ref 0–285)

## 2015-11-03 DIAGNOSIS — J301 Allergic rhinitis due to pollen: Secondary | ICD-10-CM | POA: Diagnosis not present

## 2015-11-03 DIAGNOSIS — J3089 Other allergic rhinitis: Secondary | ICD-10-CM | POA: Diagnosis not present

## 2015-11-29 ENCOUNTER — Ambulatory Visit: Payer: Medicare Other | Admitting: Internal Medicine

## 2015-12-26 ENCOUNTER — Other Ambulatory Visit: Payer: Medicare Other

## 2015-12-26 DIAGNOSIS — J301 Allergic rhinitis due to pollen: Secondary | ICD-10-CM | POA: Diagnosis not present

## 2015-12-26 DIAGNOSIS — J3089 Other allergic rhinitis: Secondary | ICD-10-CM | POA: Diagnosis not present

## 2015-12-29 ENCOUNTER — Ambulatory Visit: Payer: Medicare Other | Admitting: Endocrinology

## 2015-12-29 DIAGNOSIS — J301 Allergic rhinitis due to pollen: Secondary | ICD-10-CM | POA: Diagnosis not present

## 2015-12-29 DIAGNOSIS — J3089 Other allergic rhinitis: Secondary | ICD-10-CM | POA: Diagnosis not present

## 2016-01-05 DIAGNOSIS — J301 Allergic rhinitis due to pollen: Secondary | ICD-10-CM | POA: Diagnosis not present

## 2016-01-05 DIAGNOSIS — J3089 Other allergic rhinitis: Secondary | ICD-10-CM | POA: Diagnosis not present

## 2016-01-08 ENCOUNTER — Other Ambulatory Visit (INDEPENDENT_AMBULATORY_CARE_PROVIDER_SITE_OTHER): Payer: Medicare Other

## 2016-01-08 DIAGNOSIS — J301 Allergic rhinitis due to pollen: Secondary | ICD-10-CM | POA: Diagnosis not present

## 2016-01-08 DIAGNOSIS — E1165 Type 2 diabetes mellitus with hyperglycemia: Secondary | ICD-10-CM | POA: Diagnosis not present

## 2016-01-08 DIAGNOSIS — J3089 Other allergic rhinitis: Secondary | ICD-10-CM | POA: Diagnosis not present

## 2016-01-08 LAB — COMPREHENSIVE METABOLIC PANEL
ALT: 13 U/L (ref 0–35)
AST: 15 U/L (ref 0–37)
Albumin: 4.5 g/dL (ref 3.5–5.2)
Alkaline Phosphatase: 87 U/L (ref 39–117)
BUN: 20 mg/dL (ref 6–23)
CO2: 30 mEq/L (ref 19–32)
Calcium: 9.6 mg/dL (ref 8.4–10.5)
Chloride: 100 mEq/L (ref 96–112)
Creatinine, Ser: 0.85 mg/dL (ref 0.40–1.20)
GFR: 84.76 mL/min (ref >60.00)
Glucose, Bld: 175 mg/dL — ABNORMAL HIGH (ref 70–99)
Potassium: 4.8 mEq/L (ref 3.5–5.1)
Sodium: 139 mEq/L (ref 135–145)
Total Bilirubin: 0.5 mg/dL (ref 0.2–1.2)
Total Protein: 8 g/dL (ref 6.0–8.3)

## 2016-01-08 LAB — URINALYSIS, ROUTINE W REFLEX MICROSCOPIC
Bilirubin Urine: NEGATIVE
Hgb urine dipstick: NEGATIVE
Ketones, ur: NEGATIVE
Nitrite: NEGATIVE
Specific Gravity, Urine: 1.025 (ref 1.000–1.030)
Total Protein, Urine: NEGATIVE
Urine Glucose: NEGATIVE
Urobilinogen, UA: 0.2 (ref 0.0–1.0)
pH: 6 (ref 5.0–8.0)

## 2016-01-08 LAB — HEMOGLOBIN A1C: Hgb A1c MFr Bld: 8.3 % — ABNORMAL HIGH (ref 4.6–6.5)

## 2016-01-08 LAB — MICROALBUMIN / CREATININE URINE RATIO
Creatinine,U: 353.9 mg/dL
Microalb Creat Ratio: 0.5 mg/g (ref 0.0–30.0)
Microalb, Ur: 1.8 mg/dL (ref 0.0–1.9)

## 2016-01-11 ENCOUNTER — Ambulatory Visit (INDEPENDENT_AMBULATORY_CARE_PROVIDER_SITE_OTHER): Payer: Medicare Other | Admitting: Endocrinology

## 2016-01-11 ENCOUNTER — Encounter: Payer: Self-pay | Admitting: Endocrinology

## 2016-01-11 VITALS — BP 122/76 | HR 85 | Temp 98.4°F | Resp 14 | Ht 65.0 in | Wt 165.0 lb

## 2016-01-11 DIAGNOSIS — J3089 Other allergic rhinitis: Secondary | ICD-10-CM | POA: Diagnosis not present

## 2016-01-11 DIAGNOSIS — J301 Allergic rhinitis due to pollen: Secondary | ICD-10-CM | POA: Diagnosis not present

## 2016-01-11 DIAGNOSIS — E1165 Type 2 diabetes mellitus with hyperglycemia: Secondary | ICD-10-CM

## 2016-01-11 MED ORDER — GLIMEPIRIDE 4 MG PO TABS: 4.0000 mg | ORAL_TABLET | Freq: Two times a day (BID) | ORAL | Status: DC

## 2016-01-11 MED ORDER — ATORVASTATIN CALCIUM 10 MG PO TABS: 10.0000 mg | ORAL_TABLET | Freq: Every day | ORAL | Status: DC

## 2016-01-11 NOTE — Progress Notes (Signed)
Patient ID: NHIA MCNICHOL, female   DOB: 06-06-1946, 70 y.o.   MRN: [id number]    Reason for Appointment: Followup for Type 2 Diabetes   History of Present Illness:          Diagnosis: Type 2 diabetes mellitus, date of diagnosis:  4/13      Past history:  She was initially started on metformin but he thinks that this made her sleepy and had some abdominal discomfort   She also has been tried on other oral hypoglycemic regimens including Jentadueto  since 2014  But she had similar side effects with this  And not clear how regularly she had been taking this until it was discontinued in 5/15  A1c appears to be persistently over 8% Since 2014  She was tried on Invokamet by her PCP but because of excessive urination with this and also some nausea and weakness it was stopped With glyburide alone her blood sugars are poorly controlled and she was referred here for further management She was given Januvia but she did not take this because of expense of the medication   She was referred to the nurse educator and started on basal insulin to improve her control on 06/07/14 This was changed to premixed insulin in 9/15 because of continued poor control and her not being able to afford NovoLog mix insulin or Invokana  She stopped taking insulin in 1/16 because of the cost and was taking only Amaryl.  Recent history:   Oral hypoglycemic drugs the patient is taking are: Glimepiride  2 mg at supper and 4 mg at bedtime, metformin ER 500 mg in pm   Her blood sugars still appear to be poorly controlled A1c is still high at 8.3 Previously she had been noncompliant with her medications and glucose monitoring but she is doing well with these measures now  Current blood sugar patterns and problems identified:  She is taking her metformin and Amaryl as directed, still taking only 500 mg metformin since higher doses cause abdominal discomfort.  Her blood sugars are being checked primarily before  supper  Most of her readings after supper are high  Has not done any fasting readings  Blood sugar after breakfast was 175 in the lab  She thinks her sugars are higher because of allergies but has not had any steroids  Not doing much walking lately and has gained 10 pounds  She is also not consistent with diet and is eating cookies and does not appear to be very knowledgeable about meal planning  Glucose monitoring with Accu-Chek meter:    Recent blood sugars by   Mean values apply above for all meters except median for One Touch  PRE-MEAL Fasting Lunch Dinner Bedtime Overall  Glucose range: ?     122-166  191-218    Mean/median:     157        Side effects from medications have been: Metformin causes  and abdominal discomfort Compliance with the medical regimen: fair   Glycemic control:   Lab Results  Component Value Date   HGBA1C 8.3* 01/08/2016   HGBA1C 8.2* 08/07/2015   HGBA1C 6.8* 03/23/2015   Lab Results  Component Value Date   MICROALBUR 1.8 01/08/2016   LDLCALC 83 08/07/2015   CREATININE 0.85 01/08/2016    Self-care: The diet that the patient has been following is: tries to limit  portions      Meals:  usually 2-3 meals per day. Usually has oatmeal in  the morning, sometimes will have only fruit at lunchtime, may have rice or other carbohydrates at dinner time, will have fruit for snacks   .        Exercise: walking several days a week, 1 mile       Dietician visit: Most recent:3/14.              CDE visit last in 7/15     Weight history:  Previous range 160-172   Wt Readings from Last 3 Encounters:  01/11/16 165 lb (74.844 kg)  09/28/15 155 lb 6.4 oz (70.489 kg)  08/10/15 156 lb 12.8 oz (71.124 kg)      Medication List       This list is accurate as of: 01/11/16  2:54 PM.  Always use your most recent med list.               ACCU-CHEK SOFTCLIX LANCETS lancets  USE AS DIRECTED EVERY DAY     atenolol-chlorthalidone 50-25 MG tablet    Commonly known as:  TENORETIC  Take 1 tablet by mouth daily.     atorvastatin 10 MG tablet  Commonly known as:  LIPITOR  Take 1 tablet (10 mg total) by mouth daily.     calcium citrate-vitamin D 315-200 MG-UNIT tablet  Commonly known as:  CITRACAL+D  Take 1 tablet by mouth daily.     cholecalciferol 1000 units tablet  Commonly known as:  VITAMIN D  Take 2,000 Units by mouth daily.     glimepiride 4 MG tablet  Commonly known as:  AMARYL  TAKE 1 TABLET BY MOUTH AT DINNER AND 1/2 TABLET AT BEDTIME     glucose blood test strip  Commonly known as:  ACCU-CHEK AVIVA PLUS  Use as instructed to check blood sugar 1 time per day dx code E11.65     loratadine 10 MG tablet  Commonly known as:  CLARITIN  Take 1 tablet (10 mg total) by mouth 2 (two) times daily as needed for allergies.     metFORMIN 500 MG 24 hr tablet  Commonly known as:  GLUCOPHAGE-XR  TAKE 2 TABLETS EVERY DAY WITH SUPPER     omeprazole 20 MG capsule  Commonly known as:  PRILOSEC  Take 20 mg by mouth daily.     SE-TAN PLUS 162-115.2-1 MG Caps  Take 1 capsule by mouth daily.     spironolactone 50 MG tablet  Commonly known as:  ALDACTONE  TAKE 1 TABLET EVERY DAY        Allergies:  Allergies  Allergen Reactions  . Aspirin     REACTION: nervous  . Beef-Derived Products   . Celecoxib     REACTION: joints swell  . Milk-Related Compounds   . Mold Extract [Trichophyton]   . Nsaids     REACTION: sweling  . Peanuts [Peanut Oil]   . Shellfish Allergy   . Sulfamethoxazole     REACTION: urticaria (hives)    Past Medical History  Diagnosis Date  . Allergy   . Hyperlipidemia   . Arthritis   . Diabetes mellitus without complication ([place])   . Hypertension     Past Surgical History  Procedure Laterality Date  . Colonoscopy  2008  . Flexible sigmoidoscopy  2003  . Abdominal hysterectomy  1994    Family History  Problem Relation Age of Onset  . Hypertension Sister   . Stroke    . Diabetes Father    . Hypertension Father   . Stroke Father   .  Heart disease Neg Hx   . Diabetes Sister   . Diabetes Brother   . Hypertension Brother   . Stroke Brother     Social History:  reports that she has never smoked. She has never used smokeless tobacco. She reports that she does not drink alcohol or use illicit drugs.    Review of Systems   Retinal exam: Most recent: 6/16  With the optometrist.  HYPOKALEMIA: Her potassium has been low consistently in the past This is better controlled with her taking Aldactone 50 mg regularly  Lab Results  Component Value Date   CREATININE 0.85 01/08/2016   BUN 20 01/08/2016   NA 139 01/08/2016   K 4.8 01/08/2016   CL 100 01/08/2016   CO2 30 01/08/2016        Lipids: She is Supposed to be on Lipitor 10 mg as directed for mild hyperlipidemia as well as other risk factors She says she did not continue taking it because of cost  She does have family history of CVA but no known history of CAD       Lab Results  Component Value Date   CHOL 156 08/07/2015   HDL 56.10 08/07/2015   LDLCALC 83 08/07/2015   LDLDIRECT 121.7 03/04/2012   TRIG 86.0 08/07/2015   CHOLHDL 3 08/07/2015    Last foot exam was in 09/2015  Physical Examination:  BP 122/76 mmHg  Pulse 85  Temp(Src) 98.4 F (36.9 C)  Resp 14  Ht 5\' 5"  (1.651 m)  Wt 165 lb (74.844 kg)  BMI 27.46 kg/m2  SpO2 98%         ASSESSMENT:  Diabetes type 2, uncontrolled   See history of present illness for detailed discussion of his current management, blood sugar patterns and problems identified Her A1c is still over 8% despite her taking her medication regularly Discussed that she probably has some insulin deficiency and needs more effective medication than metformin and Amaryl However she refuses to consider any new medications because of cost Has gained 10 pounds in probably can do much better with diet and exercise regimen   Hypertension: Blood pressure is  controlled    PLAN:   Start monitoring blood sugars more often fasting and after supper Restart walking when she can Consultation with dietitian Consider adding premixed insulin at suppertime  Restart Lipitor are she can find out from the pharmacy which medication would be less expensive  Patient Instructions  Check blood sugars on waking up 2  times a week  Also check blood sugars about 2 hours after a meal and do this after different meals by rotation  Recommended blood sugar levels on waking up is 90-130 and about 2 hours after meal is 130-160  Please bring your blood sugar monitor to each visit, thank you  Take 1 Amaryl at supper and bedtime  Restart lipitor       Charlane Westry 01/11/2016, 2:54 PM   Note: This office note was prepared with Dragon voice recognition system technology. Any transcriptional errors that result from this process are unintentional.

## 2016-01-11 NOTE — Patient Instructions (Signed)
Check blood sugars on waking up 2  times a week  Also check blood sugars about 2 hours after a meal and do this after different meals by rotation  Recommended blood sugar levels on waking up is 90-130 and about 2 hours after meal is 130-160  Please bring your blood sugar monitor to each visit, thank you  Take 1 Amaryl at supper and bedtime  Restart lipitor

## 2016-01-17 ENCOUNTER — Other Ambulatory Visit: Payer: Self-pay | Admitting: Endocrinology

## 2016-02-20 ENCOUNTER — Other Ambulatory Visit: Payer: Self-pay | Admitting: Endocrinology

## 2016-02-20 ENCOUNTER — Other Ambulatory Visit (INDEPENDENT_AMBULATORY_CARE_PROVIDER_SITE_OTHER): Payer: Medicare Other

## 2016-02-20 DIAGNOSIS — J301 Allergic rhinitis due to pollen: Secondary | ICD-10-CM | POA: Diagnosis not present

## 2016-02-20 DIAGNOSIS — J3089 Other allergic rhinitis: Secondary | ICD-10-CM | POA: Diagnosis not present

## 2016-02-20 DIAGNOSIS — E1165 Type 2 diabetes mellitus with hyperglycemia: Secondary | ICD-10-CM | POA: Diagnosis not present

## 2016-02-20 LAB — LIPID PANEL
Cholesterol: 211 mg/dL — ABNORMAL HIGH (ref 0–200)
HDL: 56.4 mg/dL (ref >39.00)
LDL Cholesterol: 129 mg/dL — ABNORMAL HIGH (ref 0–99)
NonHDL: 154.38
Total CHOL/HDL Ratio: 4
Triglycerides: 126 mg/dL (ref 0.0–149.0)
VLDL: 25.2 mg/dL (ref 0.0–40.0)

## 2016-02-20 LAB — BASIC METABOLIC PANEL
BUN: 16 mg/dL (ref 6–23)
CO2: 30 mEq/L (ref 19–32)
Calcium: 9.7 mg/dL (ref 8.4–10.5)
Chloride: 95 mEq/L — ABNORMAL LOW (ref 96–112)
Creatinine, Ser: 0.85 mg/dL (ref 0.40–1.20)
GFR: 84.73 mL/min (ref >60.00)
Glucose, Bld: 334 mg/dL — ABNORMAL HIGH (ref 70–99)
Potassium: 3.8 mEq/L (ref 3.5–5.1)
Sodium: 137 mEq/L (ref 135–145)

## 2016-02-21 LAB — FRUCTOSAMINE: Fructosamine: 463 umol/L — ABNORMAL HIGH (ref 0–285)

## 2016-02-28 ENCOUNTER — Encounter: Payer: Self-pay | Admitting: Endocrinology

## 2016-02-28 ENCOUNTER — Ambulatory Visit (INDEPENDENT_AMBULATORY_CARE_PROVIDER_SITE_OTHER): Payer: Medicare Other | Admitting: Endocrinology

## 2016-02-28 VITALS — BP 118/74 | HR 76 | Temp 97.8°F | Resp 14 | Ht 65.0 in | Wt 162.6 lb

## 2016-02-28 DIAGNOSIS — E1165 Type 2 diabetes mellitus with hyperglycemia: Secondary | ICD-10-CM | POA: Diagnosis not present

## 2016-02-28 MED ORDER — CANAGLIFLOZIN 100 MG PO TABS: ORAL_TABLET | ORAL | Status: DC

## 2016-02-28 MED ORDER — ATORVASTATIN CALCIUM 10 MG PO TABS: 10.0000 mg | ORAL_TABLET | Freq: Every day | ORAL | Status: DC

## 2016-02-28 NOTE — Progress Notes (Signed)
Patient ID: Pam Obrien, female   DOB: 1946/11/08, 70 y.o.   MRN: [id number]    Reason for Appointment: Followup for Type 2 Diabetes   History of Present Illness:          Diagnosis: Type 2 diabetes mellitus, date of diagnosis:  4/13      Past history:  She was initially started on metformin but he thinks that this made her sleepy and had some abdominal discomfort   She also has been tried on other oral hypoglycemic regimens including Jentadueto  since 2014  But she had similar side effects with this  And not clear how regularly she had been taking this until it was discontinued in 5/15  A1c appears to be persistently over 8% Since 2014  She was tried on Invokamet by her PCP but because of excessive urination with this and also some nausea and weakness it was stopped With glyburide alone her blood sugars are poorly controlled and she was referred here for further management She was given Januvia but she did not take this because of expense of the medication   She was referred to the nurse educator and started on basal insulin to improve her control on 06/07/14 This was changed to premixed insulin in 9/15 because of continued poor control and her not being able to afford NovoLog mix insulin or Invokana  She stopped taking insulin in 1/16 because of the cost and was taking only Amaryl.  Recent history:   Oral hypoglycemic drugs the patient is taking are: Glimepiride  2 mg at supper and 4 mg at bedtime, metformin ER 500 mg in pm   Her blood sugars still appear to be poorly controlled A1c is  high at 8.3 and now her fructosamine is over 400 On her last visit she was wanting to improve her diet and no change in medication because of cost of brand name medications  Current management, blood sugar patterns and problems identified:  She is taking her metformin and Amaryl as directed, still taking only 500 mg metformin since higher doses cause abdominal discomfort.  Her blood  sugars are not being checked at home as she had some difficulty getting her test strips from the pharmacy  Her glucose was over 300 after lunch when she came in for her labs  She is trying to do a little better with her diet and has lost 3 pounds.  However still tends to have high carbohydrate meals like oatmeal and rice, drinking some ginger ale  She has not seen the dietitian as instructed  She is trying to walk as before  Glucose monitoring with Accu-Chek meter:    Recent blood sugars not checked     Side effects from medications have been: Metformin causes abdominal discomfort Compliance with the medical regimen: fair  Self-care: The diet that the patient has been following is: tries to limit  portions      Meals:  usually 2-3 meals per day. Usually has oatmeal in the morning, sometimes will have only fruit at lunchtime, may have rice or other carbohydrates at dinner time, will have fruit for snacks   .        Exercise: walking several days a week, 1 mile       Dietician visit: Most recent:3/14.              CDE visit last in 7/15     Glycemic control:   Lab Results  Component Value Date  HGBA1C 8.3* 01/08/2016   HGBA1C 8.2* 08/07/2015   HGBA1C 6.8* 03/23/2015   Lab Results  Component Value Date   MICROALBUR 1.8 01/08/2016   LDLCALC 129* 02/20/2016   CREATININE 0.85 02/20/2016    No visits with results within 1 Week(s) from this visit. Latest known visit with results is:  Lab on 02/20/2016  Component Date Value Ref Range Status  . Sodium 02/20/2016 137  135 - 145 mEq/L Final  . Potassium 02/20/2016 3.8  3.5 - 5.1 mEq/L Final  . Chloride 02/20/2016 95* 96 - 112 mEq/L Final  . CO2 02/20/2016 30  19 - 32 mEq/L Final  . Glucose, Bld 02/20/2016 334* 70 - 99 mg/dL Final  . BUN 02/20/2016 16  6 - 23 mg/dL Final  . Creatinine, Ser 02/20/2016 0.85  0.40 - 1.20 mg/dL Final  . Calcium 02/20/2016 9.7  8.4 - 10.5 mg/dL Final  . GFR 02/20/2016 84.73  >60.00 mL/min Final  .  Fructosamine 02/20/2016 463* 0 - 285 umol/L Final   Comment: Published reference interval for apparently healthy subjects between age 70 and 63 is 30 - 285 umol/L and in a poorly controlled diabetic population is 228 - 563 umol/L with a mean of 396 umol/L.   Marland Kitchen Cholesterol 02/20/2016 211* 0 - 200 mg/dL Final   ATP III Classification       Desirable:  < 200 mg/dL               Borderline High:  200 - 239 mg/dL          High:  > = 240 mg/dL  . Triglycerides 02/20/2016 126.0  0.0 - 149.0 mg/dL Final   Normal:  <150 mg/dLBorderline High:  150 - 199 mg/dL  . HDL 02/20/2016 56.40  >39.00 mg/dL Final  . VLDL 02/20/2016 25.2  0.0 - 40.0 mg/dL Final  . LDL Cholesterol 02/20/2016 129* 0 - 99 mg/dL Final  . Total CHOL/HDL Ratio 02/20/2016 4   Final                  Men          Women1/2 Average Risk     3.4          3.3Average Risk          5.0          4.42X Average Risk          9.6          7.13X Average Risk          15.0          11.0                      . NonHDL 02/20/2016 154.38   Final   NOTE:  Non-HDL goal should be 30 mg/dL higher than patient's LDL goal (i.e. LDL goal of < 70 mg/dL, would have non-HDL goal of < 100 mg/dL)     Weight history:  Previous range 160-172   Wt Readings from Last 3 Encounters:  02/28/16 162 lb 9.6 oz (73.755 kg)  01/11/16 165 lb (74.844 kg)  09/28/15 155 lb 6.4 oz (70.489 kg)      Medication List       This list is accurate as of: 02/28/16  3:55 PM.  Always use your most recent med list.               ACCU-CHEK AVIVA PLUS test strip  Generic drug:  glucose blood  USE AS DIRECTED TO CHECK BLOOD SUGAR ONCE DAILY     ACCU-CHEK SOFTCLIX LANCETS lancets  USE AS DIRECTED EVERY DAY     atenolol-chlorthalidone 50-25 MG tablet  Commonly known as:  TENORETIC  Take 1 tablet by mouth daily.     atorvastatin 10 MG tablet  Commonly known as:  LIPITOR  Take 1 tablet (10 mg total) by mouth daily.     calcium citrate-vitamin D 315-200 MG-UNIT tablet    Commonly known as:  CITRACAL+D  Take 1 tablet by mouth daily.     canagliflozin 100 MG Tabs tablet  Commonly known as:  INVOKANA  1 tablet before breakfast     cholecalciferol 1000 units tablet  Commonly known as:  VITAMIN D  Take 2,000 Units by mouth daily.     glimepiride 4 MG tablet  Commonly known as:  AMARYL  Take 1 tablet (4 mg total) by mouth 2 (two) times daily.     loratadine 10 MG tablet  Commonly known as:  CLARITIN  Take 1 tablet (10 mg total) by mouth 2 (two) times daily as needed for allergies.     metFORMIN 500 MG 24 hr tablet  Commonly known as:  GLUCOPHAGE-XR  TAKE 2 TABLETS EVERY DAY WITH SUPPER     omeprazole 20 MG capsule  Commonly known as:  PRILOSEC  Take 20 mg by mouth daily.     SE-TAN PLUS 162-115.2-1 MG Caps  Take 1 capsule by mouth daily.     spironolactone 50 MG tablet  Commonly known as:  ALDACTONE  TAKE 1 TABLET EVERY DAY        Allergies:  Allergies  Allergen Reactions  . Aspirin     REACTION: nervous  . Beef-Derived Products   . Celecoxib     REACTION: joints swell  . Milk-Related Compounds   . Mold Extract [Trichophyton]   . Nsaids     REACTION: sweling  . Peanuts [Peanut Oil]   . Shellfish Allergy   . Sulfamethoxazole     REACTION: urticaria (hives)    Past Medical History  Diagnosis Date  . Allergy   . Hyperlipidemia   . Arthritis   . Diabetes mellitus without complication ([place])   . Hypertension     Past Surgical History  Procedure Laterality Date  . Colonoscopy  2008  . Flexible sigmoidoscopy  2003  . Abdominal hysterectomy  1994    Family History  Problem Relation Age of Onset  . Hypertension Sister   . Stroke    . Diabetes Father   . Hypertension Father   . Stroke Father   . Heart disease Neg Hx   . Diabetes Sister   . Diabetes Brother   . Hypertension Brother   . Stroke Brother     Social History:  reports that she has never smoked. She has never used smokeless tobacco. She reports that she  does not drink alcohol or use illicit drugs.    Review of Systems   Retinal exam: Most recent: 6/16  With the optometrist.  HYPERTENSION: She takes a half a tablet of atenolol/chlorthalidone  HYPOKALEMIA: Her potassium has been low consistently in the past This is  controlled with her taking Aldactone 50 mg regularly  Lab Results  Component Value Date   CREATININE 0.85 02/20/2016   BUN 16 02/20/2016   NA 137 02/20/2016   K 3.8 02/20/2016   CL 95* 02/20/2016   CO2 30 02/20/2016  Lipids: She is Supposed to be on Lipitor 10 mg as directed for mild hyperlipidemia as well as other risk factors She says she did not start it as she thinks her pharmacy did not have it for her Prescription was sent last month Lipids are poorly-controlled still  She does have family history of CVA but no known history of CAD       Lab Results  Component Value Date   CHOL 211* 02/20/2016   HDL 56.40 02/20/2016   LDLCALC 129* 02/20/2016   LDLDIRECT 121.7 03/04/2012   TRIG 126.0 02/20/2016   CHOLHDL 4 02/20/2016    Last foot exam was in 09/2015  Physical Examination:  BP 118/74 mmHg  Pulse 76  Temp(Src) 97.8 F (36.6 C)  Resp 14  Ht 5\' 5"  (1.651 m)  Wt 162 lb 9.6 oz (73.755 kg)  BMI 27.06 kg/m2  SpO2 97%      ASSESSMENT:  Diabetes type 2, uncontrolled   See history of present illness for detailed discussion of his current management, blood sugar patterns and problems identified Currently is on Amaryl and only low dose metformin Although she is trying to improve her diet her blood sugars are still poorly controlled, lab glucose 334 and fructosamine 463 Discussed various options for improving her blood sugar control with additional medications or insulin She is very reluctant to start back on insulin She also will not get any brand name medications if too expensive Also not monitoring blood sugars at home recently and is going to get her test strips now  Hypertension: Blood  pressure is controlled  HYPOKALEMIA: Controlled with Aldactone  PLAN:   Start monitoring blood sugars regularly, some  fasting and after supper also Trial of Invokana 100 mg daily. Discussed action of SGLT 2 drugs on lowering glucose by decreasing kidney absorption of glucose, benefits of weight loss and lower blood pressure, possible side effects including candidiasis and dosage regimen  With this she will need to reduce her Aldactone in half and have her labs checked for metabolic panel in follow-up She will try to avoid drinks with sugar including ginger ale  Reduce Amaryl to half tablet when the blood sugars are below 100 at night Regular walking Resume getting some protein at breakfast and less carbohydrate at meals otherwise Consultation with dietitian  Restart Lipitor    Patient Instructions  Restart Lipitor generic  Check blood sugars on waking up 3  times a week Also check blood sugars about 2 hours after a meal and do this after different meals by rotation  Recommended blood sugar levels on waking up is 90-130 and about 2 hours after meal is 130-160  Please bring your blood sugar monitor to each visit, thank you  Take Invokana in am  Increase fluids, no regular soft drinks  Cut spironolactone in 1/2    Counseling time on subjects discussed above is over 50% of today's 25 minute visit    Pam Obrien 02/28/2016, 3:55 PM   Note: This office note was prepared with Estate agent. Any transcriptional errors that result from this process are unintentional.

## 2016-02-28 NOTE — Patient Instructions (Signed)
Restart Lipitor generic  Check blood sugars on waking up 3  times a week Also check blood sugars about 2 hours after a meal and do this after different meals by rotation  Recommended blood sugar levels on waking up is 90-130 and about 2 hours after meal is 130-160  Please bring your blood sugar monitor to each visit, thank you  Take Invokana in am  Increase fluids, no regular soft drinks  Cut spironolactone in 1/2

## 2016-03-08 DIAGNOSIS — J3089 Other allergic rhinitis: Secondary | ICD-10-CM | POA: Diagnosis not present

## 2016-03-08 DIAGNOSIS — J301 Allergic rhinitis due to pollen: Secondary | ICD-10-CM | POA: Diagnosis not present

## 2016-03-17 ENCOUNTER — Other Ambulatory Visit: Payer: Self-pay | Admitting: Endocrinology

## 2016-03-19 ENCOUNTER — Other Ambulatory Visit: Payer: Medicare Other

## 2016-03-20 ENCOUNTER — Ambulatory Visit (INDEPENDENT_AMBULATORY_CARE_PROVIDER_SITE_OTHER): Payer: Medicare Other | Admitting: Endocrinology

## 2016-03-20 ENCOUNTER — Encounter: Payer: Self-pay | Admitting: Endocrinology

## 2016-03-20 VITALS — BP 126/82 | HR 75 | Temp 98.5°F | Resp 14 | Ht 65.0 in | Wt 161.6 lb

## 2016-03-20 DIAGNOSIS — E1165 Type 2 diabetes mellitus with hyperglycemia: Secondary | ICD-10-CM

## 2016-03-20 DIAGNOSIS — E785 Hyperlipidemia, unspecified: Secondary | ICD-10-CM

## 2016-03-20 MED ORDER — INSULIN LISPRO PROT & LISPRO (75-25 MIX) 100 UNIT/ML KWIKPEN
PEN_INJECTOR | SUBCUTANEOUS | Status: DC
Start: 2016-03-20 — End: 2016-03-28

## 2016-03-20 MED ORDER — ATORVASTATIN CALCIUM 10 MG PO TABS: 10.0000 mg | ORAL_TABLET | Freq: Every day | ORAL | Status: DC

## 2016-03-20 NOTE — Patient Instructions (Signed)
8 units insulin before breakfast   Egg or other protein at Bfst  Check blood sugars on waking up 2-3  times a week  Also check blood sugars about 2 hours after a meal and do this after different meals by rotation  Recommended blood sugar levels on waking up is 90-130 and about 2 hours after meal is 130-160  Please bring your blood sugar monitor to each visit, thank you

## 2016-03-20 NOTE — Progress Notes (Signed)
Patient ID: Pam Obrien, female   DOB: [birth year], 70 y.o.   MRN: [id number]    Reason for Appointment: Followup for Type 2 Diabetes   History of Present Illness:          Diagnosis: Type 2 diabetes mellitus, date of diagnosis:  4/13      Past history:  She was initially started on metformin but he thinks that this made her sleepy and had some abdominal discomfort   She also has been tried on other oral hypoglycemic regimens including Jentadueto  since 2014  But she had similar side effects with this  And not clear how regularly she had been taking this until it was discontinued in 5/15  A1c appears to be persistently over 8% Since 2014  She was tried on Invokamet by her PCP but because of excessive urination with this and also some nausea and weakness it was stopped With glyburide alone her blood sugars are poorly controlled and she was referred here for further management She was given Januvia but she did not take this because of expense of the medication   She was referred to the nurse educator and started on basal insulin to improve her control on 06/07/14 This was changed to premixed insulin in 9/15 because of continued poor control and her not being able to afford NovoLog mix insulin or Invokana  She stopped taking insulin in 1/16 because of the cost and was taking only Amaryl.  Recent history:   Oral hypoglycemic drugs the patient is taking are: Glimepiride  2 mg at supper and 4 mg at bedtime, metformin ER 500 mg in pm   Since her A1c was 8.3 and consistently high in 1/17 she was recommended Invokana She did not take this because of the expense Her blood sugars still appear to be poorly controlled Today did not bring her monitor for download and again not clear what her home blood sugar patterns are  Current management, blood sugar patterns and problems identified:  She believes her fasting blood sugars are usually fairly good, mostly near normal but not clear  how point she is monitoring  She will occasionally check readings after meals and these may be at times over 200 based on what she is eating  Today her glucose is nearly 200 about 4 hours after eating her oatmeal this morning  She is again refusing to consider any brand name medications or insulin  She has not seen the dietitian as yet  She is trying to walk regularly but not losing weight  Glucose monitoring with Accu-Chek meter:    Recent blood sugars: As above    Side effects from medications have been: Metformin causes abdominal discomfort Compliance with the medical regimen: fair  Self-care: The diet that the patient has been following is: tries to limit  portions      Meals:  usually 2-3 meals per day. Usually has oatmeal in the morning, sometimes will have only fruit at lunchtime, may have rice or other carbohydrates at dinner time, will have fruit for snacks   .        Exercise: walking several days a week, 1 mile       Dietician visit: Most recent:3/14.              CDE visit last in 7/15     Glycemic control:   Lab Results  Component Value Date   HGBA1C 8.3* 01/08/2016   HGBA1C 8.2* 08/07/2015   HGBA1C  6.8* 03/23/2015   Lab Results  Component Value Date   MICROALBUR 1.8 01/08/2016   LDLCALC 129* 02/20/2016   CREATININE 0.85 02/20/2016    No visits with results within 1 Week(s) from this visit. Latest known visit with results is:  Lab on 02/20/2016  Component Date Value Ref Range Status  . Sodium 02/20/2016 137  135 - 145 mEq/L Final  . Potassium 02/20/2016 3.8  3.5 - 5.1 mEq/L Final  . Chloride 02/20/2016 95* 96 - 112 mEq/L Final  . CO2 02/20/2016 30  19 - 32 mEq/L Final  . Glucose, Bld 02/20/2016 334* 70 - 99 mg/dL Final  . BUN 02/20/2016 16  6 - 23 mg/dL Final  . Creatinine, Ser 02/20/2016 0.85  0.40 - 1.20 mg/dL Final  . Calcium 02/20/2016 9.7  8.4 - 10.5 mg/dL Final  . GFR 02/20/2016 84.73  >60.00 mL/min Final  . Fructosamine 02/20/2016 463* 0 -  285 umol/L Final   Comment: Published reference interval for apparently healthy subjects between age 74 and 37 is 93 - 285 umol/L and in a poorly controlled diabetic population is 228 - 563 umol/L with a mean of 396 umol/L.   Marland Kitchen Cholesterol 02/20/2016 211* 0 - 200 mg/dL Final   ATP III Classification       Desirable:  < 200 mg/dL               Borderline High:  200 - 239 mg/dL          High:  > = 240 mg/dL  . Triglycerides 02/20/2016 126.0  0.0 - 149.0 mg/dL Final   Normal:  <150 mg/dLBorderline High:  150 - 199 mg/dL  . HDL 02/20/2016 56.40  >39.00 mg/dL Final  . VLDL 02/20/2016 25.2  0.0 - 40.0 mg/dL Final  . LDL Cholesterol 02/20/2016 129* 0 - 99 mg/dL Final  . Total CHOL/HDL Ratio 02/20/2016 4   Final                  Men          Women1/2 Average Risk     3.4          3.3Average Risk          5.0          4.42X Average Risk          9.6          7.13X Average Risk          15.0          11.0                      . NonHDL 02/20/2016 154.38   Final   NOTE:  Non-HDL goal should be 30 mg/dL higher than patient's LDL goal (i.e. LDL goal of < 70 mg/dL, would have non-HDL goal of < 100 mg/dL)     Weight history:  Previous range 160-172   Wt Readings from Last 3 Encounters:  03/20/16 161 lb 9.6 oz (73.301 kg)  02/28/16 162 lb 9.6 oz (73.755 kg)  01/11/16 165 lb (74.844 kg)      Medication List       This list is accurate as of: 03/20/16  2:15 PM.  Always use your most recent med list.               ACCU-CHEK AVIVA PLUS test strip  Generic drug:  glucose blood  USE AS DIRECTED TO CHECK BLOOD SUGAR  ONCE DAILY     ACCU-CHEK SOFTCLIX LANCETS lancets  USE AS DIRECTED EVERY DAY     atenolol-chlorthalidone 50-25 MG tablet  Commonly known as:  TENORETIC  Take 1 tablet by mouth daily.     atorvastatin 10 MG tablet  Commonly known as:  LIPITOR  Take 1 tablet (10 mg total) by mouth daily.     calcium citrate-vitamin D 315-200 MG-UNIT tablet  Commonly known as:  CITRACAL+D    Take 1 tablet by mouth daily.     cholecalciferol 1000 units tablet  Commonly known as:  VITAMIN D  Take 2,000 Units by mouth daily.     glimepiride 4 MG tablet  Commonly known as:  AMARYL  Take 1 tablet (4 mg total) by mouth 2 (two) times daily.     loratadine 10 MG tablet  Commonly known as:  CLARITIN  Take 1 tablet (10 mg total) by mouth 2 (two) times daily as needed for allergies.     metFORMIN 500 MG 24 hr tablet  Commonly known as:  GLUCOPHAGE-XR  TAKE 2 TABLETS EVERY DAY WITH SUPPER     omeprazole 20 MG capsule  Commonly known as:  PRILOSEC  Take 20 mg by mouth daily.     SE-TAN PLUS 162-115.2-1 MG Caps  Take 1 capsule by mouth daily.     spironolactone 50 MG tablet  Commonly known as:  ALDACTONE  TAKE 1 TABLET EVERY DAY        Allergies:  Allergies  Allergen Reactions  . Aspirin     REACTION: nervous  . Beef-Derived Products   . Celecoxib     REACTION: joints swell  . Milk-Related Compounds   . Mold Extract [Trichophyton]   . Nsaids     REACTION: sweling  . Peanuts [Peanut Oil]   . Shellfish Allergy   . Sulfamethoxazole     REACTION: urticaria (hives)    Past Medical History  Diagnosis Date  . Allergy   . Hyperlipidemia   . Arthritis   . Diabetes mellitus without complication ([place])   . Hypertension     Past Surgical History  Procedure Laterality Date  . Colonoscopy  2008  . Flexible sigmoidoscopy  2003  . Abdominal hysterectomy  1994    Family History  Problem Relation Age of Onset  . Hypertension Sister   . Stroke    . Diabetes Father   . Hypertension Father   . Stroke Father   . Heart disease Neg Hx   . Diabetes Sister   . Diabetes Brother   . Hypertension Brother   . Stroke Brother     Social History:  reports that she has never smoked. She has never used smokeless tobacco. She reports that she does not drink alcohol or use illicit drugs.    Review of Systems   Retinal exam: Most recent: 6/16  With the  optometrist.  HYPERTENSION: She takes a half a tablet of atenolol/chlorthalidone  HYPOKALEMIA: Her potassium has been low consistently in the past This is  controlled with her taking Aldactone 50 mg   Lab Results  Component Value Date   CREATININE 0.85 02/20/2016   BUN 16 02/20/2016   NA 137 02/20/2016   K 3.8 02/20/2016   CL 95* 02/20/2016   CO2 30 02/20/2016        Lipids: She was told to start on Lipitor 10 mg as directed for mild hyperlipidemia as well as other risk factors She says she did not start it  again because she thinks her pharmacy did not call her about the prescription which was sent again Prescription was sent last month  She does have family history of CVA but no known history of CAD       Lab Results  Component Value Date   CHOL 211* 02/20/2016   HDL 56.40 02/20/2016   LDLCALC 129* 02/20/2016   LDLDIRECT 121.7 03/04/2012   TRIG 126.0 02/20/2016   CHOLHDL 4 02/20/2016    Last foot exam was in 09/2015  Physical Examination:  BP 126/82 mmHg  Pulse 75  Temp(Src) 98.5 F (36.9 C)  Resp 14  Ht 5\' 5"  (1.651 m)  Wt 161 lb 9.6 oz (73.301 kg)  BMI 26.89 kg/m2  SpO2 93%      ASSESSMENT:  Diabetes type 2, uncontrolled   See history of present illness for detailed discussion of his current management, blood sugar patterns and problems identified Currently is on Amaryl and only low dose metformin She has had A1c persistently over 8% Postprandial readings are high and fasting readings are not as high but because no since she did not bring her monitor again for review She is very reluctant to start back on insulin She also will not get any brand name medications if too expensive  Hypertension: Blood pressure is controlled   PLAN:   After much discussion about her diabetes control and progression of her disease and need for further action she agrees to try the Humalog mix again as before She was shown how to use the Humalog mix pen device in the  office Since she reports that her fasting readings are not high will only start 8 units in the morning and plan on increasing the dose as needed If she is not able to do enough home monitoring will consider continuous glucose monitoring for 2 weeks Discussed timing and targets of blood sugars Encouraged her to have more protein with her meals especially breakfast which she is not doing Consultation with dietitian has been scheduled More regular walking for exercise  Restart Lipitor, she needs to ask the pharmacy to give the prescription that was all recent  Will need follow-up lipids  There are no Patient Instructions on file for this visit.  Counseling time on subjects discussed above is over 50% of today's 25 minute visit    Pam Obrien 03/20/2016, 2:15 PM   Note: This office note was prepared with Estate agent. Any transcriptional errors that result from this process are unintentional.

## 2016-03-22 ENCOUNTER — Encounter: Payer: Medicare Other | Attending: Endocrinology | Admitting: Dietician

## 2016-03-22 ENCOUNTER — Encounter: Payer: Self-pay | Admitting: Dietician

## 2016-03-22 ENCOUNTER — Other Ambulatory Visit: Payer: Self-pay | Admitting: Endocrinology

## 2016-03-22 VITALS — Ht 65.0 in | Wt 161.0 lb

## 2016-03-22 DIAGNOSIS — E119 Type 2 diabetes mellitus without complications: Secondary | ICD-10-CM | POA: Insufficient documentation

## 2016-03-22 DIAGNOSIS — IMO0002 Reserved for concepts with insufficient information to code with codable children: Secondary | ICD-10-CM

## 2016-03-22 DIAGNOSIS — E118 Type 2 diabetes mellitus with unspecified complications: Secondary | ICD-10-CM

## 2016-03-22 DIAGNOSIS — E1165 Type 2 diabetes mellitus with hyperglycemia: Secondary | ICD-10-CM

## 2016-03-22 NOTE — Progress Notes (Signed)
  Medical Nutrition Therapy:  Appt start time: 1330 end time:  [id number].   Assessment:  Primary concerns today: Patient is here alone.  She would like to learn to reduce her blood sugar.  She states that she stays hungry all of the time.  She has had type 2 diabetes since 02/2012.  Her last HgbA1C was 8.3% (01/08/16).  She gets her eyes examined once per year.  She checks her blood sugar once a day.  129-150 in the am and 150-200 in the pm.    Patient's daughter lives with her.  Patient and daughter shop and patient cooks her own food.  She is retired from a company that [employer].  Preferred Learning Style:   No preference indicated   Learning Readiness:   Ready  MEDICATIONS:   See lit to include amaryl, humalog (75/25) which she has not started yet.  She states that she was on it in the past and "it did not work".  Per the chart the insulin was too expensive.  She is also taking Metformin   DIETARY INTAKE:  Usual eating pattern includes 3 meals and 1-2 snacks per day.  Avoided foods include wheat, milk, beef, shellfish, pinto beans and other beans, peanut butter, [place] nuts due to allergies but eats them occasionally.  24-hr recall:  B ( AM): "anything that my body craves", leftovers  Snk ( AM): usually none L ( PM): fruit, saltines with peanut butter and jelly Snk ( PM): occasional cracker or apple D ( PM): pork, [place], chicken with rice or grits Snk ( PM): lorna cookies Beverages: water  Usual physical activity: walks daily for 5-10 minutes or stretching  Estimated energy needs: 160 calories 180 g carbohydrates 100 g protein 53 g fat  Progress Towards Goal(s):  In progress.   Nutritional Diagnosis:  NB-1.1 Food and nutrition-related knowledge deficit As related to balance of carbohydrate, protein and fat.  As evidenced by diet hx and patient report.    Intervention:  Nutrition counseling and diabetes education initiated. Discussed Carb Counting by food group  as method of portion control, reading food labels, and benefits of increased activity.  Stay as active as possible.  Try to walk 30 minutes every day. Choose sugar free popsickles rather than regular. Consider gluten free products if your wheat allergy gets worse. Aim for 3 Carb Choices per meal (45 grams) +/- 1 either way  Aim for 0-1 Carbs per snack if hungry  Include protein in moderation with your meals and snacks Aim for half of your plate to be non starchy vegetables. Be careful about portion sizes of starchy foods.  Teaching Method Utilized:  Visual Auditory Hands on  Handouts given during visit include: Living Well with Diabetes Food Label handouts Meal Plan Card Snack list  Barriers to learning/adherence to lifestyle change: ability to comprehend  Demonstrated degree of understanding via:  Teach Back   Monitoring/Evaluation:  Dietary intake, exercise, and body weight prn.

## 2016-03-22 NOTE — Patient Instructions (Signed)
Stay as active as possible.  Try to walk 30 minutes every day. Choose sugar free popsickles rather than regular. Consider gluten free products if your wheat allergy gets worse. Aim for 3 Carb Choices per meal (45 grams) +/- 1 either way  Aim for 0-1 Carbs per snack if hungry  Include protein in moderation with your meals and snacks Aim for half of your plate to be non starchy vegetables. Be careful about portion sizes of starchy foods.

## 2016-03-28 ENCOUNTER — Telehealth: Payer: Self-pay | Admitting: Endocrinology

## 2016-03-28 MED ORDER — INSULIN ASPART PROT & ASPART (70-30 MIX) 100 UNIT/ML PEN
8.0000 [IU] | PEN_INJECTOR | Freq: Two times a day (BID) | SUBCUTANEOUS | Status: DC
Start: 2016-03-28 — End: 2016-05-22

## 2016-03-28 NOTE — Telephone Encounter (Signed)
She must have used the coupon before.  She can try the same dose of NovoLog mix 70/30, not clear if this will be covered as well as Humalog Mix

## 2016-03-28 NOTE — Addendum Note (Signed)
Addended by: Verlin Grills T on: 03/28/2016 04:37 PM   Modules accepted: Orders, Medications

## 2016-03-28 NOTE — Telephone Encounter (Signed)
Pt calling to let us know that the rx for insulin lispro qwikpens? With a 30 day voucher, it was refused please advise

## 2016-03-28 NOTE — Telephone Encounter (Signed)
See note below and please advise on how to proceed. Thanks!  

## 2016-03-28 NOTE — Telephone Encounter (Signed)
Pt advised of note below and voiced. Rx sent.

## 2016-04-17 ENCOUNTER — Other Ambulatory Visit: Payer: Self-pay | Admitting: Endocrinology

## 2016-04-19 ENCOUNTER — Other Ambulatory Visit: Payer: Medicare Other

## 2016-04-22 ENCOUNTER — Ambulatory Visit: Payer: Medicare Other | Admitting: Endocrinology

## 2016-05-16 ENCOUNTER — Other Ambulatory Visit: Payer: Self-pay | Admitting: Adult Health

## 2016-05-17 ENCOUNTER — Other Ambulatory Visit (INDEPENDENT_AMBULATORY_CARE_PROVIDER_SITE_OTHER): Payer: Medicare Other

## 2016-05-17 DIAGNOSIS — E785 Hyperlipidemia, unspecified: Secondary | ICD-10-CM | POA: Diagnosis not present

## 2016-05-17 DIAGNOSIS — E1165 Type 2 diabetes mellitus with hyperglycemia: Secondary | ICD-10-CM

## 2016-05-17 LAB — BASIC METABOLIC PANEL
BUN: 22 mg/dL (ref 6–23)
CO2: 31 mEq/L (ref 19–32)
Calcium: 9.6 mg/dL (ref 8.4–10.5)
Chloride: 100 mEq/L (ref 96–112)
Creatinine, Ser: 0.86 mg/dL (ref 0.40–1.20)
GFR: 83.54 mL/min (ref >60.00)
Glucose, Bld: 203 mg/dL — ABNORMAL HIGH (ref 70–99)
Potassium: 3.7 mEq/L (ref 3.5–5.1)
Sodium: 140 mEq/L (ref 135–145)

## 2016-05-17 LAB — HEMOGLOBIN A1C: Hgb A1c MFr Bld: 7.4 % — ABNORMAL HIGH (ref 4.6–6.5)

## 2016-05-17 LAB — LDL CHOLESTEROL, DIRECT: Direct LDL: 130 mg/dL

## 2016-05-22 ENCOUNTER — Ambulatory Visit (INDEPENDENT_AMBULATORY_CARE_PROVIDER_SITE_OTHER): Payer: Medicare Other | Admitting: Endocrinology

## 2016-05-22 ENCOUNTER — Encounter: Payer: Self-pay | Admitting: Endocrinology

## 2016-05-22 ENCOUNTER — Encounter: Payer: Medicare Other | Attending: Endocrinology | Admitting: Nutrition

## 2016-05-22 VITALS — BP 128/82 | HR 74 | Ht 65.0 in | Wt 159.0 lb

## 2016-05-22 DIAGNOSIS — E119 Type 2 diabetes mellitus without complications: Secondary | ICD-10-CM | POA: Diagnosis not present

## 2016-05-22 DIAGNOSIS — E785 Hyperlipidemia, unspecified: Secondary | ICD-10-CM

## 2016-05-22 DIAGNOSIS — E1165 Type 2 diabetes mellitus with hyperglycemia: Secondary | ICD-10-CM

## 2016-05-22 MED ORDER — "INSULIN SYRINGE 30G X 5/16"" 0.3 ML MISC": Status: DC

## 2016-05-22 MED ORDER — INSULIN NPH (HUMAN) (ISOPHANE) 100 UNIT/ML ~~LOC~~ SUSP: SUBCUTANEOUS | Status: DC

## 2016-05-22 NOTE — Patient Instructions (Addendum)
Novolin N insulin 6 units at bedtime  Go up or down 1 units if sugars are either over 140 or under 110  Glimeperide at supper 1 pill only  Check blood sugars on waking up  4-5 times a week Also check blood sugars about 2 hours after a meal and do this after different meals by rotation  Recommended blood sugar levels on waking up is 90-130 and about 2 hours after meal is 130-160  Please bring your blood sugar monitor to each visit, thank you

## 2016-05-22 NOTE — Progress Notes (Signed)
Patient ID: Pam Obrien, female   DOB: 1946-10-11, 70 y.o.   MRN: [id number]    Reason for Appointment: Followup for Type 2 Diabetes   History of Present Illness:          Diagnosis: Type 2 diabetes mellitus, date of diagnosis:  4/13      Past history:  She was initially started on metformin but he thinks that this made her sleepy and had some abdominal discomfort   She also has been tried on other oral hypoglycemic regimens including Jentadueto  since 2014  But she had similar side effects with this  And not clear how regularly she had been taking this until it was discontinued in 5/15  A1c appears to be persistently over 8% Since 2014  She was tried on Invokamet by her PCP but because of excessive urination with this and also some nausea and weakness it was stopped With glyburide alone her blood sugars are poorly controlled and she was referred here for further management She was given Januvia but she did not take this because of expense of the medication   She was referred to the nurse educator and started on basal insulin to improve her control on 06/07/14 This was changed to premixed insulin in 9/15 because of continued poor control and her not being able to afford NovoLog mix insulin or Invokana  She stopped taking insulin in 1/16 because of the cost and was taking only Amaryl.  Recent history:   Oral hypoglycemic drugs: Glimepiride  2 mg at supper and 4 mg at bedtime, metformin ER 500 mg in pm   Since her A1c was 8.3 she was sent to the dietitian She has improved her diet somewhat and is getting somewhat better readings and A1c below 8 now  Current management, blood sugar patterns and problems identified:  She still has persistently high fasting readings, as high as 186  Blood sugars are not significantly high at bedtime although this is about 4-5 hours after eating.  She cannot tolerate more than 500 mg of metformin  She was advised to start insulin on the  previous visit but she did not do so because of the cost  She thinks her blood sugars will go up with eating any carbohydrate  She is trying to walk regularly   Glucose monitoring with Accu-Chek meter:    Recent blood sugars:  Mean values apply above for all meters except median for One Touch  PRE-MEAL Fasting Lunch Dinner Bedtime Overall  Glucose range: 146-186  164  133-158    Mean/median: 160    157   Side effects from medications have been: Metformin causes abdominal discomfort Compliance with the medical regimen: fair  Self-care: The diet that the patient has been following is: tries to limit  portions      Meals:  usually 2-3 meals per day. Usually has oatmeal in the morning, may have rice or other carbohydrates at dinner time, will have fruit for snacks   .        Exercise: walking several days a week, 1 mile       Dietician visit: Most recent:3/14.              CDE visit last in 7/15  Wt Readings from Last 3 Encounters:  05/22/16 159 lb (72.122 kg)  03/22/16 161 lb (73.029 kg)  03/20/16 161 lb 9.6 oz (73.301 kg)       Glycemic control:   Lab Results  Component Value Date   HGBA1C 7.4* 05/17/2016   HGBA1C 8.3* 01/08/2016   HGBA1C 8.2* 08/07/2015   Lab Results  Component Value Date   MICROALBUR 1.8 01/08/2016   LDLCALC 129* 02/20/2016   CREATININE 0.86 05/17/2016    Lab on 05/17/2016  Component Date Value Ref Range Status  . Sodium 05/17/2016 140  135 - 145 mEq/L Final  . Potassium 05/17/2016 3.7  3.5 - 5.1 mEq/L Final  . Chloride 05/17/2016 100  96 - 112 mEq/L Final  . CO2 05/17/2016 31  19 - 32 mEq/L Final  . Glucose, Bld 05/17/2016 203* 70 - 99 mg/dL Final  . BUN 05/17/2016 22  6 - 23 mg/dL Final  . Creatinine, Ser 05/17/2016 0.86  0.40 - 1.20 mg/dL Final  . Calcium 05/17/2016 9.6  8.4 - 10.5 mg/dL Final  . GFR 05/17/2016 83.54  >60.00 mL/min Final  . Hgb A1c MFr Bld 05/17/2016 7.4* 4.6 - 6.5 % Final   Glycemic Control Guidelines for People with  Diabetes:Non Diabetic:  <6%Goal of Therapy: <7%Additional Action Suggested:  >8%   . Direct LDL 05/17/2016 130.0   Final   Optimal:  <100 mg/dLNear or Above Optimal:  100-129 mg/dLBorderline High:  130-159 mg/dLHigh:  160-189 mg/dLVery High:  >190 mg/dL     Weight history:  Previous range 160-172   Wt Readings from Last 3 Encounters:  05/22/16 159 lb (72.122 kg)  03/22/16 161 lb (73.029 kg)  03/20/16 161 lb 9.6 oz (73.301 kg)      Medication List       This list is accurate as of: 05/22/16  9:14 PM.  Always use your most recent med list.               ACCU-CHEK AVIVA PLUS test strip  Generic drug:  glucose blood  USE AS DIRECTED TO CHECK BLOOD SUGAR ONCE DAILY     ACCU-CHEK SOFTCLIX LANCETS lancets  USE AS DIRECTED EVERY DAY     atenolol-chlorthalidone 50-25 MG tablet  Commonly known as:  TENORETIC  TAKE 1 TABLET BY MOUTH DAILY.     atorvastatin 10 MG tablet  Commonly known as:  LIPITOR  Take 1 tablet (10 mg total) by mouth daily.     calcium citrate-vitamin D 315-200 MG-UNIT tablet  Commonly known as:  CITRACAL+D  Take 1 tablet by mouth daily.     cholecalciferol 1000 units tablet  Commonly known as:  VITAMIN D  Take 2,000 Units by mouth daily.     glimepiride 4 MG tablet  Commonly known as:  AMARYL  Take 1 tablet (4 mg total) by mouth 2 (two) times daily.     glimepiride 4 MG tablet  Commonly known as:  AMARYL  TAKE 1 TABLET BY MOUTH AT DINNER AND 1/2 TABLET AT BEDTIME     insulin aspart protamine - aspart (70-30) 100 UNIT/ML FlexPen  Commonly known as:  NOVOLOG MIX 70/30 FLEXPEN  Inject 0.08 mLs (8 Units total) into the skin 2 (two) times daily.     insulin NPH Human 100 UNIT/ML injection  Commonly known as:  NOVOLIN N RELION  6 units daily at bedtime     INSULIN SYRINGE .3CC/30GX5/16" 30G X 5/16" 0.3 ML Misc  Use to inject insulin 1 time per day     loratadine 10 MG tablet  Commonly known as:  CLARITIN  Take 1 tablet (10 mg total) by mouth 2  (two) times daily as needed for allergies.     metFORMIN 500  MG 24 hr tablet  Commonly known as:  GLUCOPHAGE-XR  TAKE 2 TABLETS EVERY DAY WITH SUPPER     omeprazole 20 MG capsule  Commonly known as:  PRILOSEC  Take 20 mg by mouth daily.     SE-TAN PLUS 162-115.2-1 MG Caps  Take 1 capsule by mouth daily.     spironolactone 50 MG tablet  Commonly known as:  ALDACTONE  TAKE 1 TABLET EVERY DAY        Allergies:  Allergies  Allergen Reactions  . Aspirin     REACTION: nervous  . Beef-Derived Products   . Celecoxib     REACTION: joints swell  . Milk-Related Compounds   . Mold Extract [Trichophyton]   . Nsaids     REACTION: sweling  . Peanuts [Peanut Oil]   . Shellfish Allergy   . Sulfamethoxazole     REACTION: urticaria (hives)    Past Medical History  Diagnosis Date  . Allergy   . Hyperlipidemia   . Arthritis   . Diabetes mellitus without complication ([place])   . Hypertension     Past Surgical History  Procedure Laterality Date  . Colonoscopy  2008  . Flexible sigmoidoscopy  2003  . Abdominal hysterectomy  1994    Family History  Problem Relation Age of Onset  . Hypertension Sister   . Stroke    . Diabetes Father   . Hypertension Father   . Stroke Father   . Heart disease Neg Hx   . Diabetes Sister   . Diabetes Brother   . Hypertension Brother   . Stroke Brother     Social History:  reports that she has never smoked. She has never used smokeless tobacco. She reports that she does not drink alcohol or use illicit drugs.    Review of Systems   Retinal exam: Most recent: 6/16  With the optometrist.   HYPERTENSION: She takes a half a tablet of atenolol/chlorthalidone  HYPOKALEMIA: Her potassium has been low consistently in the past This is  controlled with her taking Aldactone 50 mg   Lab Results  Component Value Date   CREATININE 0.86 05/17/2016   BUN 22 05/17/2016   NA 140 05/17/2016   K 3.7 05/17/2016   CL 100 05/17/2016   CO2 31  05/17/2016        Lipids: She was told to start on Lipitor 10 mg as directed for mild hyperlipidemia as well as other risk factors   She does have family history of CVA but no known history of CAD       Lab Results  Component Value Date   CHOL 211* 02/20/2016   HDL 56.40 02/20/2016   LDLCALC 129* 02/20/2016   LDLDIRECT 130.0 05/17/2016   TRIG 126.0 02/20/2016   CHOLHDL 4 02/20/2016    Last foot exam was in 09/2015  Physical Examination:  BP 128/82 mmHg  Pulse 74  Ht 5\' 5"  (1.651 m)  Wt 159 lb (72.122 kg)  BMI 26.46 kg/m2  SpO2 97%      ASSESSMENT:  Diabetes type 2, uncontrolled   See history of present illness for detailed discussion of his current management, blood sugar patterns and problems identified Currently is on Amaryl and only low dose metformin She has had A1c Above target although slightly better recently with improving her diet  She is not taking blood sugar readings as often as needed Persistently high fasting readings are related to insulin resistance and insulin deficiency. Although she can benefit from  various non-insulin drugs she is very reluctant to pay for brand name drugs Discussed likelihood of progression of her diabetes on Amaryl alone  Hypertension: Blood pressure is controlled   PLAN:    She will start NPH insulin at bedtime to control fasting hyperglycemia  She will stop the Amaryl at bedtime  In the meantime will use 4 mg Amaryl at dinnertime along with 500 mg of metformin  She will need to start checking blood sugars more often and consistently in the morning  Discussed principles of adjustment of bedtime insulin by 1 unit every 3-4 days.  She will be instructed in detail by nurse educator especially on technique of drawing up insulin from the bottle   Continue Lipitor  Discussed benefits of statin drugs Will need follow-up lipids  Patient Instructions  Novolin N insulin 6 units at bedtime  Go up or down 1 units if  sugars are either over 140 or under 110  Glimeperide at supper 1 pill only  Check blood sugars on waking up  4-5 times a week Also check blood sugars about 2 hours after a meal and do this after different meals by rotation  Recommended blood sugar levels on waking up is 90-130 and about 2 hours after meal is 130-160  Please bring your blood sugar monitor to each visit, thank you    Counseling time on subjects discussed above is over 50% of today's 25 minute visit       Yula Crotwell 05/22/2016, 9:14 PM   Note: This office note was prepared with Dragon voice recognition system technology. Any transcriptional errors that result from this process are unintentional.

## 2016-06-19 ENCOUNTER — Encounter: Payer: Self-pay | Admitting: Endocrinology

## 2016-06-19 ENCOUNTER — Ambulatory Visit (INDEPENDENT_AMBULATORY_CARE_PROVIDER_SITE_OTHER): Payer: Medicare Other | Admitting: Endocrinology

## 2016-06-19 VITALS — BP 118/80 | HR 75 | Ht 65.0 in | Wt 157.0 lb

## 2016-06-19 DIAGNOSIS — E1165 Type 2 diabetes mellitus with hyperglycemia: Secondary | ICD-10-CM

## 2016-06-19 NOTE — Patient Instructions (Addendum)
Have a low fat dairy snack at bedtime  N insulin 9 units at bedtime  Glimeperide 1/2 at Bfst in am  Check blood sugars on waking up  3x per week  Also check blood sugars about 2 hours after a meal and do this after different meals by rotation  Recommended blood sugar levels on waking up is 90-140 and about 2 hours after meal is 130-180  Please bring your blood sugar monitor to each visit, thank you

## 2016-06-19 NOTE — Progress Notes (Signed)
Patient ID: Pam Obrien, female   DOB: August 29, 1946, 70 y.o.   MRN: [id number]    Reason for Appointment: Followup for Type 2 Diabetes   History of Present Illness:          Diagnosis: Type 2 diabetes mellitus, date of diagnosis:  4/13      Past history:  She was initially started on metformin but he thinks that this made her sleepy and had some abdominal discomfort   She also has been tried on other oral hypoglycemic regimens including Jentadueto  since 2014  But she had similar side effects with this  And not clear how regularly she had been taking this until it was discontinued in 5/15  A1c appears to be persistently over 8% Since 2014  She was tried on Invokamet by her PCP but because of excessive urination with this and also some nausea and weakness it was stopped With glyburide alone her blood sugars are poorly controlled and she was referred here for further management She was given Januvia but she did not take this because of expense of the medication   She was referred to the nurse educator and started on basal insulin to improve her control on 06/07/14 This was changed to premixed insulin in 9/15 because of continued poor control and her not being able to afford NovoLog mix insulin or Invokana  She stopped taking insulin in 1/16 because of the cost and was taking only Amaryl.  Recent history:   Oral hypoglycemic drugs: 8 units N insulin at bedtime  Glimepiride  4 mg at supper, metformin ER 500 mg in pm   Since her blood sugars were consistently poorly controlled she was started on bedtime NPH Previously taking only Amaryl and metformin, she does not want to take any brand name medications  Current management, blood sugar patterns and problems identified:  She has gone up to units on her insulin that was started at 6 units and fasting readings are relatively better in the last few days  However blood sugars are not being checked after meals during the day and  he seemed to be higher usually  Recently has only one or 2 readings after evening meal which are about 160  She feels a little hungry in the middle of the night and will have a snack.  Glucose readings overnight are not low and are relatively higher around 170  She has occasionally postprandial readings over 200, today after eating a sandwich at breakfast  She is trying to walk regularly, weather permitting   Glucose monitoring with Accu-Chek meter:    Mean values apply above for all meters except median for One Touch  PRE-MEAL Fasting Afternoon  PC Dinner Overnight  Overall  Glucose range: 136-214  149-288  164, 161  161-180    Mean/median: 172    183    Side effects from medications have been: Metformin causes abdominal discomfort Compliance with the medical regimen: fair  Self-care: The diet that the patient has been following is: tries to limit  portions      Meals:  usually 2-3 meals per day. Usually has variable intake in the morning, may have Some carbohydrates at dinner time, will have fruit for snacks   .        Exercise: walking several days a week, 1 mile       Dietician visit: Most recent: 03/2016 .              CDE  visit last in 7/15  Wt Readings from Last 3 Encounters:  06/19/16 157 lb (71.2 kg)  05/22/16 159 lb (72.1 kg)  03/22/16 161 lb (73 kg)       Glycemic control:   Lab Results  Component Value Date   HGBA1C 7.4 (H) 05/17/2016   HGBA1C 8.3 (H) 01/08/2016   HGBA1C 8.2 (H) 08/07/2015   Lab Results  Component Value Date   MICROALBUR 1.8 01/08/2016   LDLCALC 129 (H) 02/20/2016   CREATININE 0.86 05/17/2016    No visits with results within 1 Week(s) from this visit.  Latest known visit with results is:  Lab on 05/17/2016  Component Date Value Ref Range Status  . Sodium 05/17/2016 140  135 - 145 mEq/L Final  . Potassium 05/17/2016 3.7  3.5 - 5.1 mEq/L Final  . Chloride 05/17/2016 100  96 - 112 mEq/L Final  . CO2 05/17/2016 31  19 - 32 mEq/L Final   . Glucose, Bld 05/17/2016 203* 70 - 99 mg/dL Final  . BUN 05/17/2016 22  6 - 23 mg/dL Final  . Creatinine, Ser 05/17/2016 0.86  0.40 - 1.20 mg/dL Final  . Calcium 05/17/2016 9.6  8.4 - 10.5 mg/dL Final  . GFR 05/17/2016 83.54  >60.00 mL/min Final  . Hgb A1c MFr Bld 05/17/2016 7.4* 4.6 - 6.5 % Final  . Direct LDL 05/17/2016 130.0  mg/dL Final     Weight history:  Previous range 160-172   Wt Readings from Last 3 Encounters:  06/19/16 157 lb (71.2 kg)  05/22/16 159 lb (72.1 kg)  03/22/16 161 lb (73 kg)      Medication List       Accurate as of 06/19/16  1:50 PM. Always use your most recent med list.          ACCU-CHEK AVIVA PLUS test strip Generic drug:  glucose blood USE AS DIRECTED TO CHECK BLOOD SUGAR ONCE DAILY   ACCU-CHEK SOFTCLIX LANCETS lancets USE AS DIRECTED EVERY DAY   atenolol-chlorthalidone 50-25 MG tablet Commonly known as:  TENORETIC TAKE 1 TABLET BY MOUTH DAILY.   atorvastatin 10 MG tablet Commonly known as:  LIPITOR Take 1 tablet (10 mg total) by mouth daily.   calcium citrate-vitamin D 315-200 MG-UNIT tablet Commonly known as:  CITRACAL+D Take 1 tablet by mouth daily.   cholecalciferol 1000 units tablet Commonly known as:  VITAMIN D Take 2,000 Units by mouth daily.   glimepiride 4 MG tablet Commonly known as:  AMARYL TAKE 1 TABLET BY MOUTH AT DINNER AND 1/2 TABLET AT BEDTIME   insulin NPH Human 100 UNIT/ML injection Commonly known as:  NOVOLIN N RELION 6 units daily at bedtime   INSULIN SYRINGE .3CC/30GX5/16" 30G X 5/16" 0.3 ML Misc Use to inject insulin 1 time per day   loratadine 10 MG tablet Commonly known as:  CLARITIN Take 1 tablet (10 mg total) by mouth 2 (two) times daily as needed for allergies.   metFORMIN 500 MG 24 hr tablet Commonly known as:  GLUCOPHAGE-XR TAKE 2 TABLETS EVERY DAY WITH SUPPER   omeprazole 20 MG capsule Commonly known as:  PRILOSEC Take 20 mg by mouth daily.   SE-TAN PLUS 162-115.2-1 MG Caps Take 1  capsule by mouth daily.   spironolactone 50 MG tablet Commonly known as:  ALDACTONE TAKE 1 TABLET EVERY DAY       Allergies:  Allergies  Allergen Reactions  . Aspirin     REACTION: nervous  . Beef-Derived Products   . Celecoxib  REACTION: joints swell  . Milk-Related Compounds   . Mold Extract [Trichophyton]   . Nsaids     REACTION: sweling  . Peanuts [Peanut Oil]   . Shellfish Allergy   . Sulfamethoxazole     REACTION: urticaria (hives)    Past Medical History:  Diagnosis Date  . Allergy   . Arthritis   . Diabetes mellitus without complication ([place])   . Hyperlipidemia   . Hypertension     Past Surgical History:  Procedure Laterality Date  . ABDOMINAL HYSTERECTOMY  1994  . COLONOSCOPY  2008  . FLEXIBLE SIGMOIDOSCOPY  2003    Family History  Problem Relation Age of Onset  . Hypertension Sister   . Stroke    . Diabetes Father   . Hypertension Father   . Stroke Father   . Heart disease Neg Hx   . Diabetes Sister   . Diabetes Brother   . Hypertension Brother   . Stroke Brother     Social History:  reports that she has never smoked. She has never used smokeless tobacco. She reports that she does not drink alcohol or use drugs.    Review of Systems   Retinal exam: Most recent: 6/16  With the optometrist.   HYPERTENSION: She takes a half a tablet of atenolol/chlorthalidone  HYPOKALEMIA: Her potassium has been low consistently in the past This is  controlled with her taking Aldactone 50 mg   Lab Results  Component Value Date   CREATININE 0.86 05/17/2016   BUN 22 05/17/2016   NA 140 05/17/2016   K 3.7 05/17/2016   CL 100 05/17/2016   CO2 31 05/17/2016        Lipids: She was told to start on Lipitor 10 mg as directed for mild hyperlipidemia as well as other risk factors   She does have family history of CVA but no known history of CAD       Lab Results  Component Value Date   CHOL 211 (H) 02/20/2016   HDL 56.40 02/20/2016   LDLCALC 129  (H) 02/20/2016   LDLDIRECT 130.0 05/17/2016   TRIG 126.0 02/20/2016   CHOLHDL 4 02/20/2016    Last foot exam was in 09/2015  Physical Examination:  BP 118/80   Pulse 75   Ht 5\' 5"  (1.651 m)   Wt 157 lb (71.2 kg)   SpO2 97%   BMI 26.13 kg/m       ASSESSMENT:  Diabetes type 2, uncontrolled   See history of present illness for detailed discussion of  current management, blood sugar patterns and problems identified Currently is on Amaryl and only low dose metformin She has had A1c Above target although slightly better recently with improving her diet  She is not taking blood sugar readings as often as needed Persistently high fasting readings are related to insulin resistance and insulin deficiency. Although she can benefit from various non-insulin drugs she is very reluctant to pay for brand name drugs Discussed likelihood of progression of her diabetes on Amaryl alone  Hypertension: Blood pressure is controlled   PLAN:    She will continue bedtime NPH, go up to 9 units for now  Start taking 2 mg Amaryl at breakfast  Control carbohydrates especially at breakfast  Check blood sugars at various times  Continue walking  Check A1c on the next visit   Will need follow-up lipids  There are no Patient Instructions on file for this visit.       Pam Obrien 06/19/2016,  1:50 PM   Note: This office note was prepared with Estate agent. Any transcriptional errors that result from this process are unintentional.

## 2016-07-04 DIAGNOSIS — J3089 Other allergic rhinitis: Secondary | ICD-10-CM | POA: Diagnosis not present

## 2016-07-04 DIAGNOSIS — H1045 Other chronic allergic conjunctivitis: Secondary | ICD-10-CM | POA: Diagnosis not present

## 2016-07-04 DIAGNOSIS — J3081 Allergic rhinitis due to animal (cat) (dog) hair and dander: Secondary | ICD-10-CM | POA: Diagnosis not present

## 2016-07-04 DIAGNOSIS — J301 Allergic rhinitis due to pollen: Secondary | ICD-10-CM | POA: Diagnosis not present

## 2016-07-04 NOTE — Assessment & Plan Note (Signed)
Patient was instructed on how to inject NPH insulin using a vial and syringe.   We discussed how this insulin works, how to draw up the dose, how/where to inject.  She re demonstrated the procedure correctly but did not give insulin due to timing. We also discussed low blood sugars--symptoms and treatment.  She reports having had some of these symptoms a long time ago. We also dicussed how to go up on the dose.  Written instructions were given for 6u to be taken at bedtime.  She  Will wait 3-4 days, if the morning blood sugars are over 150, she will increase the dose by 1 units.  We reviewed this and she had no questions.

## 2016-07-04 NOTE — Patient Instructions (Signed)
Take 6 units of insulin at bedtime.  Increase the dose of insulin every 3-4 days if the morning blood sugars are over 150.   Call if questions, or having low blood sugars.

## 2016-08-14 ENCOUNTER — Other Ambulatory Visit: Payer: Medicare Other

## 2016-08-19 ENCOUNTER — Ambulatory Visit: Payer: Medicare Other | Admitting: Endocrinology

## 2016-09-04 ENCOUNTER — Other Ambulatory Visit (INDEPENDENT_AMBULATORY_CARE_PROVIDER_SITE_OTHER): Payer: Medicare Other

## 2016-09-04 DIAGNOSIS — E785 Hyperlipidemia, unspecified: Secondary | ICD-10-CM

## 2016-09-04 DIAGNOSIS — E1165 Type 2 diabetes mellitus with hyperglycemia: Secondary | ICD-10-CM

## 2016-09-04 LAB — LIPID PANEL
Cholesterol: 243 mg/dL — ABNORMAL HIGH (ref 0–200)
HDL: 58.1 mg/dL (ref >39.00)
LDL Cholesterol: 154 mg/dL — ABNORMAL HIGH (ref 0–99)
NonHDL: 184.91
Total CHOL/HDL Ratio: 4
Triglycerides: 156 mg/dL — ABNORMAL HIGH (ref 0.0–149.0)
VLDL: 31.2 mg/dL (ref 0.0–40.0)

## 2016-09-04 LAB — COMPREHENSIVE METABOLIC PANEL
ALT: 14 U/L (ref 0–35)
AST: 15 U/L (ref 0–37)
Albumin: 4.7 g/dL (ref 3.5–5.2)
Alkaline Phosphatase: 111 U/L (ref 39–117)
BUN: 24 mg/dL — ABNORMAL HIGH (ref 6–23)
CO2: 29 mEq/L (ref 19–32)
Calcium: 10.3 mg/dL (ref 8.4–10.5)
Chloride: 95 mEq/L — ABNORMAL LOW (ref 96–112)
Creatinine, Ser: 0.99 mg/dL (ref 0.40–1.20)
GFR: 70.95 mL/min (ref >60.00)
Glucose, Bld: 238 mg/dL — ABNORMAL HIGH (ref 70–99)
Potassium: 4.3 mEq/L (ref 3.5–5.1)
Sodium: 137 mEq/L (ref 135–145)
Total Bilirubin: 0.5 mg/dL (ref 0.2–1.2)
Total Protein: 8.7 g/dL — ABNORMAL HIGH (ref 6.0–8.3)

## 2016-09-04 LAB — HEMOGLOBIN A1C: Hgb A1c MFr Bld: 7.1 % — ABNORMAL HIGH (ref 4.6–6.5)

## 2016-09-09 ENCOUNTER — Encounter: Payer: Self-pay | Admitting: Endocrinology

## 2016-09-09 ENCOUNTER — Ambulatory Visit (INDEPENDENT_AMBULATORY_CARE_PROVIDER_SITE_OTHER): Payer: Medicare Other | Admitting: Endocrinology

## 2016-09-09 VITALS — BP 128/82 | HR 85 | Ht 65.0 in | Wt 158.0 lb

## 2016-09-09 DIAGNOSIS — Z794 Long term (current) use of insulin: Secondary | ICD-10-CM

## 2016-09-09 DIAGNOSIS — E1165 Type 2 diabetes mellitus with hyperglycemia: Secondary | ICD-10-CM

## 2016-09-09 MED ORDER — PRAVASTATIN SODIUM 40 MG PO TABS: 40.0000 mg | ORAL_TABLET | Freq: Every day | ORAL | 3 refills | Status: DC

## 2016-09-09 NOTE — Progress Notes (Signed)
Patient ID: Pam Obrien, female   DOB: 1945/12/18, 70 y.o.   MRN: [id number]    Reason for Appointment: Followup for Type 2 Diabetes   History of Present Illness:          Diagnosis: Type 2 diabetes mellitus, date of diagnosis:  4/13      Past history:  She was initially started on metformin but he thinks that this made her sleepy and had some abdominal discomfort   She also has been tried on other oral hypoglycemic regimens including Jentadueto  since 2014  But she had similar side effects with this  And not clear how regularly she had been taking this until it was discontinued in 5/15  A1c appears to be persistently over 8% Since 2014  She was tried on Invokamet by her PCP but because of excessive urination with this and also some nausea and weakness it was stopped With glyburide alone her blood sugars are poorly controlled and she was referred here for further management She was given Januvia but she did not take this because of expense of the medication   She was referred to the nurse educator and started on basal insulin to improve her control on 06/07/14 This was changed to premixed insulin in 9/15 because of continued poor control and her not being able to afford NovoLog mix insulin or Invokana  She stopped taking insulin in 1/16 because of the cost and was taking only Amaryl.  Recent history:   hypoglycemic drugs: 0-9 units N insulin at bedtime  Glimepiride  4 mg at supper, metformin ER 500 mg in pm   Since her blood sugars were consistently poorly controlled she was started on bedtime NPH in addition to Amaryl and metformin  Current management, blood sugar patterns and problems identified:  She has run out of her insulin a few days ago and even though she is getting the [employer] brand she thinks she cannot afford it all the time  However her A1c has improved significantly with starting NPH at bedtime and this is now better at 7.1  No recent glucose readings  are higher in the morning and lab glucose was 238 after eating a half banana in the morning  However blood sugars are being checked mostly late morning and usually not when she wakes up and some in the afternoon  Blood sugars overall are fairly good except a few readings sporadically about 150  However even though she is checking blood sugars mostly midday and afternoon no overall they seem to be not as high as on her last visit  She is trying to walk regularly, weather permitting   Glucose monitoring with Accu-Chek meter:     Overall RANGE 117-186 with average 145, see discussion above   Side effects from medications have been: Metformin higher doses causes abdominal discomfort Compliance with the medical regimen: fair  Self-care: The diet that the patient has been following is: tries to limit  portions      Meals:  usually 2-3 meals per day. Usually has variable intake in the morning, may have Some carbohydrates at dinner time, will have fruit for snacks           Exercise: walking several days a week, 1 mile       Dietician visit: Most recent: 03/2016 .              CDE visit last in 7/15  Wt Readings from Last 3 Encounters:  09/09/16 158  lb (71.7 kg)  06/19/16 157 lb (71.2 kg)  05/22/16 159 lb (72.1 kg)       Glycemic control:   Lab Results  Component Value Date   HGBA1C 7.1 (H) 09/04/2016   HGBA1C 7.4 (H) 05/17/2016   HGBA1C 8.3 (H) 01/08/2016   Lab Results  Component Value Date   MICROALBUR 1.8 01/08/2016   LDLCALC 154 (H) 09/04/2016   CREATININE 0.99 09/04/2016    Lab on 09/04/2016  Component Date Value Ref Range Status  . Sodium 09/04/2016 137  135 - 145 mEq/L Final  . Potassium 09/04/2016 4.3  3.5 - 5.1 mEq/L Final  . Chloride 09/04/2016 95* 96 - 112 mEq/L Final  . CO2 09/04/2016 29  19 - 32 mEq/L Final  . Glucose, Bld 09/04/2016 238* 70 - 99 mg/dL Final  . BUN 09/04/2016 24* 6 - 23 mg/dL Final  . Creatinine, Ser 09/04/2016 0.99  0.40 - 1.20 mg/dL Final    . Total Bilirubin 09/04/2016 0.5  0.2 - 1.2 mg/dL Final  . Alkaline Phosphatase 09/04/2016 111  39 - 117 U/L Final  . AST 09/04/2016 15  0 - 37 U/L Final  . ALT 09/04/2016 14  0 - 35 U/L Final  . Total Protein 09/04/2016 8.7* 6.0 - 8.3 g/dL Final  . Albumin 09/04/2016 4.7  3.5 - 5.2 g/dL Final  . Calcium 09/04/2016 10.3  8.4 - 10.5 mg/dL Final  . GFR 09/04/2016 70.95  >60.00 mL/min Final  . Cholesterol 09/04/2016 243* 0 - 200 mg/dL Final  . Triglycerides 09/04/2016 156.0* 0.0 - 149.0 mg/dL Final  . HDL 09/04/2016 58.10  >39.00 mg/dL Final  . VLDL 09/04/2016 31.2  0.0 - 40.0 mg/dL Final  . LDL Cholesterol 09/04/2016 154* 0 - 99 mg/dL Final  . Total CHOL/HDL Ratio 09/04/2016 4   Final  . NonHDL 09/04/2016 184.91   Final  . Hgb A1c MFr Bld 09/04/2016 7.1* 4.6 - 6.5 % Final     Weight history:  Previous range 160-172   Wt Readings from Last 3 Encounters:  09/09/16 158 lb (71.7 kg)  06/19/16 157 lb (71.2 kg)  05/22/16 159 lb (72.1 kg)      Medication List       Accurate as of 09/09/16  4:43 PM. Always use your most recent med list.          ACCU-CHEK AVIVA PLUS test strip Generic drug:  glucose blood USE AS DIRECTED TO CHECK BLOOD SUGAR ONCE DAILY   ACCU-CHEK SOFTCLIX LANCETS lancets USE AS DIRECTED EVERY DAY   atenolol-chlorthalidone 50-25 MG tablet Commonly known as:  TENORETIC TAKE 1 TABLET BY MOUTH DAILY.   calcium citrate-vitamin D 315-200 MG-UNIT tablet Commonly known as:  CITRACAL+D Take 1 tablet by mouth daily.   cholecalciferol 1000 units tablet Commonly known as:  VITAMIN D Take 2,000 Units by mouth daily.   glimepiride 4 MG tablet Commonly known as:  AMARYL TAKE 1 TABLET BY MOUTH AT DINNER AND 1/2 TABLET AT BEDTIME   insulin NPH Human 100 UNIT/ML injection Commonly known as:  NOVOLIN N RELION 6 units daily at bedtime   INSULIN SYRINGE .3CC/30GX5/16" 30G X 5/16" 0.3 ML Misc Use to inject insulin 1 time per day   loratadine 10 MG  tablet Commonly known as:  CLARITIN Take 1 tablet (10 mg total) by mouth 2 (two) times daily as needed for allergies.   metFORMIN 500 MG 24 hr tablet Commonly known as:  GLUCOPHAGE-XR TAKE 2 TABLETS EVERY DAY WITH SUPPER  omeprazole 20 MG capsule Commonly known as:  PRILOSEC Take 20 mg by mouth daily.   pravastatin 40 MG tablet Commonly known as:  PRAVACHOL Take 1 tablet (40 mg total) by mouth daily.   SE-TAN PLUS 162-115.2-1 MG Caps Take 1 capsule by mouth daily.   spironolactone 50 MG tablet Commonly known as:  ALDACTONE TAKE 1 TABLET EVERY DAY       Allergies:  Allergies  Allergen Reactions  . Aspirin     REACTION: nervous  . Beef-Derived Products   . Celecoxib     REACTION: joints swell  . Milk-Related Compounds   . Mold Extract [Trichophyton]   . Nsaids     REACTION: sweling  . Peanuts [Peanut Oil]   . Shellfish Allergy   . Sulfamethoxazole     REACTION: urticaria (hives)    Past Medical History:  Diagnosis Date  . Allergy   . Arthritis   . Diabetes mellitus without complication ([place])   . Hyperlipidemia   . Hypertension     Past Surgical History:  Procedure Laterality Date  . ABDOMINAL HYSTERECTOMY  1994  . COLONOSCOPY  2008  . FLEXIBLE SIGMOIDOSCOPY  2003    Family History  Problem Relation Age of Onset  . Hypertension Sister   . Stroke    . Diabetes Father   . Hypertension Father   . Stroke Father   . Heart disease Neg Hx   . Diabetes Sister   . Diabetes Brother   . Hypertension Brother   . Stroke Brother     Social History:  reports that she has never smoked. She has never used smokeless tobacco. She reports that she does not drink alcohol or use drugs.    Review of Systems   Retinal exam: Most recent: 2016  With the optometrist.   HYPERTENSION: She takes a half a tablet of atenolol/chlorthalidone  HYPOKALEMIA: Her potassium has been low consistently in the past This is  controlled with taking Aldactone 50 mg   Lab Results   Component Value Date   CREATININE 0.99 09/04/2016   BUN 24 (H) 09/04/2016   NA 137 09/04/2016   K 4.3 09/04/2016   CL 95 (L) 09/04/2016   CO2 29 09/04/2016    Lipids: She was told to start on Lipitor 10 mg as directed for mild hyperlipidemia as well as other risk factors, not on it now.  She may be in the doughnut hole and she cannot afford the co-pay now  She does have family history of CVA but no known history of CAD       Lab Results  Component Value Date   CHOL 243 (H) 09/04/2016   HDL 58.10 09/04/2016   LDLCALC 154 (H) 09/04/2016   LDLDIRECT 130.0 05/17/2016   TRIG 156.0 (H) 09/04/2016   CHOLHDL 4 09/04/2016    Last foot exam was in 09/2015  Physical Examination:  BP 128/82   Pulse 85   Ht 5\' 5"  (1.651 m)   Wt 158 lb (71.7 kg)   SpO2 96%   BMI 26.29 kg/m   No ankle edema present    ASSESSMENT:  Diabetes type 2, uncontrolled   See history of present illness for detailed discussion of  current management, blood sugar patterns and problems identified  With adding bedtime insulin her A1c has improved and now 7.1 However she now says that she cannot afford even the [employer] brand insulin of which she was taking very small doses Blood sugars are mostly fairly good  at home and higher now in the last few days with running out of insulin She is trying to be active and keep her weight fairly stable otherwise  Hypertension: Blood pressure is controlled  LIPIDS: LDL is significantly high with her running out of Lipitor which she says she cannot afford now  She refuses influenza vaccine  PLAN:    She will resume bedtime NPH 9 units for now  For her lipids she needs to try pravastatin 40 mg daily on the [employer] $4 plan since she cannot afford Lipitor  There are no Patient Instructions on file for this visit.       Maeola Mchaney 09/09/2016, 4:43 PM   Note: This office note was prepared with Dragon voice recognition system technology. Any transcriptional  errors that result from this process are unintentional.

## 2016-10-27 ENCOUNTER — Other Ambulatory Visit: Payer: Self-pay | Admitting: Endocrinology

## 2016-11-23 ENCOUNTER — Other Ambulatory Visit: Payer: Self-pay | Admitting: Endocrinology

## 2016-12-05 ENCOUNTER — Other Ambulatory Visit (INDEPENDENT_AMBULATORY_CARE_PROVIDER_SITE_OTHER): Payer: Medicare Other

## 2016-12-05 DIAGNOSIS — Z794 Long term (current) use of insulin: Secondary | ICD-10-CM

## 2016-12-05 DIAGNOSIS — E1165 Type 2 diabetes mellitus with hyperglycemia: Secondary | ICD-10-CM | POA: Diagnosis not present

## 2016-12-05 LAB — LIPID PANEL
Cholesterol: 208 mg/dL — ABNORMAL HIGH (ref 0–200)
HDL: 52.3 mg/dL (ref >39.00)
LDL Cholesterol: 135 mg/dL — ABNORMAL HIGH (ref 0–99)
NonHDL: 155.34
Total CHOL/HDL Ratio: 4
Triglycerides: 103 mg/dL (ref 0.0–149.0)
VLDL: 20.6 mg/dL (ref 0.0–40.0)

## 2016-12-05 LAB — COMPREHENSIVE METABOLIC PANEL
ALT: 12 U/L (ref 0–35)
AST: 13 U/L (ref 0–37)
Albumin: 4.5 g/dL (ref 3.5–5.2)
Alkaline Phosphatase: 110 U/L (ref 39–117)
BUN: 20 mg/dL (ref 6–23)
CO2: 28 mEq/L (ref 19–32)
Calcium: 9.8 mg/dL (ref 8.4–10.5)
Chloride: 98 mEq/L (ref 96–112)
Creatinine, Ser: 0.99 mg/dL (ref 0.40–1.20)
GFR: 70.9 mL/min (ref >60.00)
Glucose, Bld: 213 mg/dL — ABNORMAL HIGH (ref 70–99)
Potassium: 4 mEq/L (ref 3.5–5.1)
Sodium: 137 mEq/L (ref 135–145)
Total Bilirubin: 0.4 mg/dL (ref 0.2–1.2)
Total Protein: 8.5 g/dL — ABNORMAL HIGH (ref 6.0–8.3)

## 2016-12-05 LAB — HEMOGLOBIN A1C: Hgb A1c MFr Bld: 8.7 % — ABNORMAL HIGH (ref 4.6–6.5)

## 2016-12-10 ENCOUNTER — Encounter: Payer: Self-pay | Admitting: Endocrinology

## 2016-12-10 ENCOUNTER — Ambulatory Visit (INDEPENDENT_AMBULATORY_CARE_PROVIDER_SITE_OTHER): Payer: Medicare Other | Admitting: Endocrinology

## 2016-12-10 VITALS — BP 136/80 | HR 76 | Ht 65.0 in | Wt 159.0 lb

## 2016-12-10 DIAGNOSIS — E1165 Type 2 diabetes mellitus with hyperglycemia: Secondary | ICD-10-CM | POA: Diagnosis not present

## 2016-12-10 MED ORDER — PIOGLITAZONE HCL 15 MG PO TABS: 15.0000 mg | ORAL_TABLET | Freq: Every day | ORAL | 3 refills | Status: DC

## 2016-12-10 MED ORDER — ACCU-CHEK SOFTCLIX LANCETS MISC: 1 refills | Status: DC

## 2016-12-10 NOTE — Progress Notes (Signed)
j         Patient ID: Pam Obrien, female   DOB: Mar 26, 1946, 71 y.o.   MRN: [id number]    Reason for Appointment: Followup for Type 2 Diabetes   History of Present Illness:          Diagnosis: Type 2 diabetes mellitus, date of diagnosis:  4/13      Past history:  She was initially started on metformin but he thinks that this made her sleepy and had some abdominal discomfort   She also has been tried on other oral hypoglycemic regimens including Jentadueto  since 2014  But she had similar side effects with this  And not clear how regularly she had been taking this until it was discontinued in 5/15  A1c appears to be persistently over 8% Since 2014  She was tried on Invokamet by her PCP but because of excessive urination with this and also some nausea and weakness it was stopped With glyburide alone her blood sugars are poorly controlled and she was referred here for further management She was given Januvia but she did not take this because of expense of the medication   She was referred to the nurse educator and started on basal insulin to improve her control on 06/07/14 This was changed to premixed insulin in 9/15 because of continued poor control and her not being able to afford NovoLog mix insulin or Invokana  She stopped taking insulin in 1/16 because of the cost and was taking only Amaryl.  Recent history:   Hypoglycemic drugs:   Glimepiride  4 mg at supper, metformin ER 500 mg in pm   Since her blood sugars were consistently poorly controlled she was previously on bedtime NPH in addition to Amaryl and metformin She again has not taken this she cannot afford even the [employer] brand  Current management, blood sugar patterns and problems identified:  She has not checked her blood sugar as she does not have lancets and did not call for a refill  Also she says that her fingers get numb when she tries to check her sugar daily  Her fasting glucose was 213  She is trying to  walk regularly, weather permitting   Glucose monitoring with Accu-Chek meter: None recently     Side effects from medications have been: Metformin higher doses causes abdominal discomfort Compliance with the medical regimen: fair  Self-care: The diet that the patient has been following is: tries to limit  portions      Meals:  usually 2-3 meals per day. Usually has variable intake in the morning, may have Some carbohydrates at dinner time, will have fruit for snacks           Exercise: walking 1-3 days a week, 1 mile       Dietician visit: Most recent: 03/2016 .              CDE visit last in 7/15  Wt Readings from Last 3 Encounters:  12/10/16 159 lb (72.1 kg)  09/09/16 158 lb (71.7 kg)  06/19/16 157 lb (71.2 kg)       Glycemic control:   Lab Results  Component Value Date   HGBA1C 8.7 (H) 12/05/2016   HGBA1C 7.1 (H) 09/04/2016   HGBA1C 7.4 (H) 05/17/2016   Lab Results  Component Value Date   MICROALBUR 1.8 01/08/2016   LDLCALC 135 (H) 12/05/2016   CREATININE 0.99 12/05/2016    Lab on 12/05/2016  Component Date Value Ref Range  Status  . Hgb A1c MFr Bld 12/05/2016 8.7* 4.6 - 6.5 % Final  . Sodium 12/05/2016 137  135 - 145 mEq/L Final  . Potassium 12/05/2016 4.0  3.5 - 5.1 mEq/L Final  . Chloride 12/05/2016 98  96 - 112 mEq/L Final  . CO2 12/05/2016 28  19 - 32 mEq/L Final  . Glucose, Bld 12/05/2016 213* 70 - 99 mg/dL Final  . BUN 12/05/2016 20  6 - 23 mg/dL Final  . Creatinine, Ser 12/05/2016 0.99  0.40 - 1.20 mg/dL Final  . Total Bilirubin 12/05/2016 0.4  0.2 - 1.2 mg/dL Final  . Alkaline Phosphatase 12/05/2016 110  39 - 117 U/L Final  . AST 12/05/2016 13  0 - 37 U/L Final  . ALT 12/05/2016 12  0 - 35 U/L Final  . Total Protein 12/05/2016 8.5* 6.0 - 8.3 g/dL Final  . Albumin 12/05/2016 4.5  3.5 - 5.2 g/dL Final  . Calcium 12/05/2016 9.8  8.4 - 10.5 mg/dL Final  . GFR 12/05/2016 70.90  >60.00 mL/min Final  . Cholesterol 12/05/2016 208* 0 - 200 mg/dL Final  .  Triglycerides 12/05/2016 103.0  0.0 - 149.0 mg/dL Final  . HDL 12/05/2016 52.30  >39.00 mg/dL Final  . VLDL 12/05/2016 20.6  0.0 - 40.0 mg/dL Final  . LDL Cholesterol 12/05/2016 135* 0 - 99 mg/dL Final  . Total CHOL/HDL Ratio 12/05/2016 4   Final  . NonHDL 12/05/2016 155.34   Final     Weight history:  Previous range 160-172   Wt Readings from Last 3 Encounters:  12/10/16 159 lb (72.1 kg)  09/09/16 158 lb (71.7 kg)  06/19/16 157 lb (71.2 kg)    Allergies as of 12/10/2016      Reactions   Aspirin    REACTION: nervous   Beef-derived Products    Celecoxib    REACTION: joints swell   Milk-related Compounds    Mold Extract [trichophyton]    Nsaids    REACTION: sweling   Peanuts [peanut Oil]    Shellfish Allergy    Sulfamethoxazole    REACTION: urticaria (hives)      Medication List       Accurate as of 12/10/16  1:52 PM. Always use your most recent med list.          ACCU-CHEK AVIVA PLUS test strip Generic drug:  glucose blood USE AS DIRECTED TO CHECK BLOOD SUGAR ONCE DAILY   ACCU-CHEK SOFTCLIX LANCETS lancets USE AS DIRECTED EVERY DAY   atenolol-chlorthalidone 50-25 MG tablet Commonly known as:  TENORETIC TAKE 1 TABLET BY MOUTH DAILY.   calcium citrate-vitamin D 315-200 MG-UNIT tablet Commonly known as:  CITRACAL+D Take 1 tablet by mouth daily.   cholecalciferol 1000 units tablet Commonly known as:  VITAMIN D Take 2,000 Units by mouth daily.   glimepiride 4 MG tablet Commonly known as:  AMARYL TAKE 1 TABLET BY MOUTH AT DINNER AND 1/2 TABLET AT BEDTIME   insulin NPH Human 100 UNIT/ML injection Commonly known as:  NOVOLIN N RELION 6 units daily at bedtime   INSULIN SYRINGE .3CC/30GX5/16" 30G X 5/16" 0.3 ML Misc Use to inject insulin 1 time per day   loratadine 10 MG tablet Commonly known as:  CLARITIN Take 1 tablet (10 mg total) by mouth 2 (two) times daily as needed for allergies.   metFORMIN 500 MG 24 hr tablet Commonly known as:   GLUCOPHAGE-XR TAKE 2 TABLETS EVERY DAY WITH SUPPER   omeprazole 20 MG capsule Commonly known as:  PRILOSEC Take 20 mg by mouth daily.   pioglitazone 15 MG tablet Commonly known as:  ACTOS Take 1 tablet (15 mg total) by mouth daily.   pravastatin 40 MG tablet Commonly known as:  PRAVACHOL Take 1 tablet (40 mg total) by mouth daily.   SE-TAN PLUS 162-115.2-1 MG Caps Take 1 capsule by mouth daily.   spironolactone 50 MG tablet Commonly known as:  ALDACTONE TAKE 1 TABLET EVERY DAY       Allergies:  Allergies  Allergen Reactions  . Aspirin     REACTION: nervous  . Beef-Derived Products   . Celecoxib     REACTION: joints swell  . Milk-Related Compounds   . Mold Extract [Trichophyton]   . Nsaids     REACTION: sweling  . Peanuts [Peanut Oil]   . Shellfish Allergy   . Sulfamethoxazole     REACTION: urticaria (hives)    Past Medical History:  Diagnosis Date  . Allergy   . Arthritis   . Diabetes mellitus without complication ([place])   . Hyperlipidemia   . Hypertension     Past Surgical History:  Procedure Laterality Date  . ABDOMINAL HYSTERECTOMY  1994  . COLONOSCOPY  2008  . FLEXIBLE SIGMOIDOSCOPY  2003    Family History  Problem Relation Age of Onset  . Hypertension Sister   . Stroke    . Diabetes Father   . Hypertension Father   . Stroke Father   . Heart disease Neg Hx   . Diabetes Sister   . Diabetes Brother   . Hypertension Brother   . Stroke Brother     Social History:  reports that she has never smoked. She has never used smokeless tobacco. She reports that she does not drink alcohol or use drugs.    Review of Systems   Retinal exam: Most recent:05/2015  With the optometrist.   HYPERTENSION: She takes a half a tablet of atenolol/chlorthalidone  HYPOKALEMIA: Her potassium Had been low consistently in the past This is  controlled with taking Aldactone 50 mg   Lab Results  Component Value Date   CREATININE 0.99 12/05/2016   BUN 20  12/05/2016   NA 137 12/05/2016   K 4.0 12/05/2016   CL 98 12/05/2016   CO2 28 12/05/2016    Lipids: She was told to start Pravastatin on her last visit but she thinks it is too expensive LDL increased unexpected  She does have family history of CVA but no known history of CAD       Lab Results  Component Value Date   CHOL 208 (H) 12/05/2016   HDL 52.30 12/05/2016   LDLCALC 135 (H) 12/05/2016   LDLDIRECT 130.0 05/17/2016   TRIG 103.0 12/05/2016   CHOLHDL 4 12/05/2016    Last foot exam was in 09/2015  Physical Examination:  BP 136/80   Pulse 76   Ht 5\' 5"  (1.651 m)   Wt 159 lb (72.1 kg)   SpO2 96%   BMI 26.46 kg/m   No ankle edema present    ASSESSMENT:  Diabetes type 2, uncontrolled   See history of present illness for detailed discussion of  current management, blood sugar patterns and problems identified  With Only taking Amaryl and metformin her A1c has increased significant due to 8.7 She again refuses to consider insulin because of the expense Also not monitoring blood sugars at home She has not tried Actos previously  Hypertension: Blood pressure is controlled  Hypokalemia: Controlled with spironolactone  LIPIDS: LDL is  high with her not taking any medications, again says that she cannot afford anymore medications   PLAN:    She will be given a trial of Actos 15 mg daily.  Discussed that this may work slowly and potentially may cause weight gain or edema  She'll start checking her blood sugars and follow-up in 6 weeks   Needs regular eye exams    Patient Instructions  Check blood sugars on waking up 2-3x weekly   Also check blood sugars about 2 hours after a meal and do this after different meals by rotation  Recommended blood sugar levels on waking up is 90-130 and about 2 hours after meal is 130-160  Please bring your blood sugar monitor to each visit, thank you         [facility] 12/10/2016, 1:52 PM   Note: This office note  was prepared with Dragon voice recognition system technology. Any transcriptional errors that result from this process are unintentional.

## 2016-12-10 NOTE — Patient Instructions (Signed)
Check blood sugars on waking up  2-3x weekly  Also check blood sugars about 2 hours after a meal and do this after different meals by rotation  Recommended blood sugar levels on waking up is 90-130 and about 2 hours after meal is 130-160  Please bring your blood sugar monitor to each visit, thank you  

## 2016-12-16 ENCOUNTER — Telehealth: Payer: Self-pay | Admitting: Adult Health

## 2016-12-17 ENCOUNTER — Other Ambulatory Visit: Payer: Self-pay | Admitting: Adult Health

## 2016-12-17 NOTE — Telephone Encounter (Signed)
Ok to refill 

## 2016-12-17 NOTE — Telephone Encounter (Signed)
Ok to refill for one year  

## 2016-12-29 ENCOUNTER — Other Ambulatory Visit: Payer: Self-pay | Admitting: Endocrinology

## 2017-01-02 ENCOUNTER — Other Ambulatory Visit: Payer: Medicare Other

## 2017-01-06 ENCOUNTER — Ambulatory Visit: Payer: Medicare Other | Admitting: Endocrinology

## 2017-01-14 NOTE — Progress Notes (Deleted)
Subjective:   Pam Obrien is a 71 y.o. female who presents for Medicare Annual (Subsequent) preventive examination.  The Patient was informed that the wellness visit is to identify future health risk and educate and initiate measures that can reduce risk for increased disease through the lifespan.    NO ROS; Medicare Wellness Visit Seen [facility] 07/2015 - had medicare and a supplement  Describes health as good, fair or great?   Psychosocial Has 5 children; two are local  dtr lives with her   Preventive Screening -Counseling & Management  Mammogram 02/2009 Eye exam; Dr. Marica Otter; 05/2015 no diabetic retinopathy Dexa Colonoscopy 2008 Flu due Foot exam Td Hep c  Smoking history 30 pack hx: Educated regarding LDCT if applicable with a 30 year hx Also educated regarding AAA for female tobacco users with smoking hx  Smokeless tobacco  Second Hand Smoke status; No Smokers in the home ETOH  Medication adherence or issues?   RISK FACTORS Diet Regular exercise   Cardiac Risk Factors:  Advanced aged > 49 in men; >65 in women Hyperlipidemia - chol 208; HDL 52; LDL 135 and Trig 103 Diabetes - 213 and A1c 8.7; on NPH Family History  Obesity  Fall risk  Given education on "Fall Prevention in the Home" for more safety tips the patient can apply as appropriate.  Long term goal is to "age in place" or undecided   Mobility of Functional changes this year? Safety; community, wears sunscreen, safe place for firearms; Motor vehicle accidents;   Mental Health:  Any emotional problems? Anxious, depressed, irritable, sad or blue?  Denies feeling depressed or hopeless; voices pleasure in daily life How many social activities have you been engaged in within the last 2 weeks? Who would help you with chores; illness; shopping other?  Eye exam   Activities of Daily Living - See functional screen   Cognitive testing; Ad8 score; 0 or less than 2  MMSE deferred or completed  if AD8 + 2 issues  Advanced Directives   List the name of Physicians or other Practitioners you currently use:   Immunization History  Administered Date(s) Administered  . Influenza Split 09/03/2012  . Pneumococcal Conjugate-13 06/06/2015  . Pneumococcal Polysaccharide-23 12/28/2009  . Td 11/11/2006   Required Immunizations needed today  Screening test up to date or reviewed for plan of completion Health Maintenance Due  Topic Date Due  . Hepatitis C Screening  [birth year]  . MAMMOGRAM  03/04/2011  . OPHTHALMOLOGY EXAM  05/25/2016  . INFLUENZA VACCINE  06/11/2016  . FOOT EXAM  09/27/2016  . TETANUS/TDAP  11/11/2016  . URINE MICROALBUMIN  01/07/2017          Objective:     Vitals: There were no vitals taken for this visit.  There is no height or weight on file to calculate BMI.   Tobacco History  Smoking Status  . Never Smoker  Smokeless Tobacco  . Never Used     Counseling given: Not Answered   Past Medical History:  Diagnosis Date  . Allergy   . Arthritis   . Diabetes mellitus without complication ([place])   . Hyperlipidemia   . Hypertension    Past Surgical History:  Procedure Laterality Date  . ABDOMINAL HYSTERECTOMY  1994  . COLONOSCOPY  2008  . FLEXIBLE SIGMOIDOSCOPY  2003   Family History  Problem Relation Age of Onset  . Hypertension Sister   . Stroke    . Diabetes Father   .  Hypertension Father   . Stroke Father   . Heart disease Neg Hx   . Diabetes Sister   . Diabetes Brother   . Hypertension Brother   . Stroke Brother    History  Sexual Activity  . Sexual activity: Not on file    Outpatient Encounter Prescriptions as of 01/15/2017  Medication Sig  . ACCU-CHEK AVIVA PLUS test strip USE AS DIRECTED TO CHECK BLOOD SUGAR ONCE DAILY  . ACCU-CHEK SOFTCLIX LANCETS lancets USE AS DIRECTED EVERY DAY  . atenolol-chlorthalidone (TENORETIC) 50-25 MG tablet TAKE 1 TABLET BY MOUTH DAILY.  . calcium citrate-vitamin D (CITRACAL+D) 315-200 MG-UNIT  per tablet Take 1 tablet by mouth daily.  . cholecalciferol (VITAMIN D) 1000 UNITS tablet Take 2,000 Units by mouth daily.  Marland Kitchen FeFum-FePo-FA-B Cmp-C-Zn-Mn-Cu (SE-TAN PLUS) 162-115.2-1 MG CAPS TAKE 1 CAPSULE BY MOUTH DAILY.  Marland Kitchen glimepiride (AMARYL) 4 MG tablet TAKE 1 TABLET BY MOUTH AT DINNER AND 1/2 TABLET AT BEDTIME (Patient taking differently: TAKE 1 TABLET BY MOUTH AT DINNER)  . insulin NPH Human (NOVOLIN N RELION) 100 UNIT/ML injection 6 units daily at bedtime (Patient not taking: Reported on 12/10/2016)  . Insulin Syringe-Needle U-100 (INSULIN SYRINGE .3CC/30GX5/16") 30G X 5/16" 0.3 ML MISC Use to inject insulin 1 time per day  . loratadine (CLARITIN) 10 MG tablet Take 1 tablet (10 mg total) by mouth 2 (two) times daily as needed for allergies.  . metFORMIN (GLUCOPHAGE-XR) 500 MG 24 hr tablet TAKE 2 TABLETS EVERY DAY WITH SUPPER  . omeprazole (PRILOSEC) 20 MG capsule Take 20 mg by mouth daily.  . pioglitazone (ACTOS) 15 MG tablet Take 1 tablet (15 mg total) by mouth daily.  . pravastatin (PRAVACHOL) 40 MG tablet Take 1 tablet (40 mg total) by mouth daily.  Marland Kitchen spironolactone (ALDACTONE) 50 MG tablet TAKE 1 TABLET BY MOUTH ONCE DAILY   No facility-administered encounter medications on file as of 01/15/2017.     Activities of Daily Living No flowsheet data found.  Patient Care Team: [name], NP as PCP - General (Family Medicine)    Assessment:    *** Exercise Activities and Dietary recommendations    Goals    . Prevent Falls    . Reduce sugar intake to X grams per day      Fall Risk Fall Risk  03/22/2016 08/07/2015 01/06/2015 04/12/2013  Falls in the past year? No No No No   Depression Screen PHQ 2/9 Scores 03/22/2016 08/07/2015 01/06/2015 04/12/2013  PHQ - 2 Score 0 0 0 0     Cognitive Function        Immunization History  Administered Date(s) Administered  . Influenza Split 09/03/2012  . Pneumococcal Conjugate-13 06/06/2015  . Pneumococcal Polysaccharide-23 12/28/2009  .  Td 11/11/2006   Screening Tests Health Maintenance  Topic Date Due  . Hepatitis C Screening  1946-06-04  . MAMMOGRAM  03/04/2011  . OPHTHALMOLOGY EXAM  05/25/2016  . INFLUENZA VACCINE  06/11/2016  . FOOT EXAM  09/27/2016  . TETANUS/TDAP  11/11/2016  . URINE MICROALBUMIN  01/07/2017  . COLONOSCOPY  04/27/2017  . HEMOGLOBIN A1C  06/04/2017  . DEXA SCAN  Completed  . PNA vac Low Risk Adult  Completed      Plan:   *** During the course of the visit the patient was educated and counseled about the following appropriate screening and preventive services:   Vaccines to include Pneumoccal, Influenza, Hepatitis B, Td, Zostavax, HCV  Electrocardiogram  Cardiovascular Disease  Colorectal cancer screening  Bone  density screening  Diabetes screening  Glaucoma screening  Mammography/PAP  Nutrition counseling   Patient Instructions (the written plan) was given to the patient.   Wynetta Fines, RN  01/14/2017

## 2017-01-15 ENCOUNTER — Ambulatory Visit: Payer: Medicare Other

## 2017-01-17 ENCOUNTER — Other Ambulatory Visit (INDEPENDENT_AMBULATORY_CARE_PROVIDER_SITE_OTHER): Payer: Medicare Other

## 2017-01-17 DIAGNOSIS — E1165 Type 2 diabetes mellitus with hyperglycemia: Secondary | ICD-10-CM

## 2017-01-17 LAB — GLUCOSE, RANDOM: Glucose, Bld: 159 mg/dL — ABNORMAL HIGH (ref 70–99)

## 2017-01-18 LAB — FRUCTOSAMINE: Fructosamine: 323 umol/L — ABNORMAL HIGH (ref 0–285)

## 2017-01-21 ENCOUNTER — Ambulatory Visit (INDEPENDENT_AMBULATORY_CARE_PROVIDER_SITE_OTHER): Payer: Medicare Other | Admitting: Endocrinology

## 2017-01-21 ENCOUNTER — Encounter: Payer: Self-pay | Admitting: Endocrinology

## 2017-01-21 VITALS — BP 128/76 | HR 84 | Temp 97.8°F | Resp 16 | Wt 158.1 lb

## 2017-01-21 DIAGNOSIS — E1165 Type 2 diabetes mellitus with hyperglycemia: Secondary | ICD-10-CM

## 2017-01-21 DIAGNOSIS — Z794 Long term (current) use of insulin: Secondary | ICD-10-CM

## 2017-01-21 NOTE — Progress Notes (Signed)
j         Patient ID: Pam Obrien, female   DOB: 1946/11/09, 71 y.o.   MRN: [id number]    Reason for Appointment: Followup for Type 2 Diabetes   History of Present Illness:          Diagnosis: Type 2 diabetes mellitus, date of diagnosis:  4/13      Past history:  She was initially started on metformin but he thinks that this made her sleepy and had some abdominal discomfort   She also has been tried on other oral hypoglycemic regimens including Jentadueto  since 2014  But she had similar side effects with this  And not clear how regularly she had been taking this until it was discontinued in 5/15  A1c appears to be persistently over 8% Since 2014  She was tried on Invokamet by her PCP but because of excessive urination with this and also some nausea and weakness it was stopped With glyburide alone her blood sugars are poorly controlled and she was referred here for further management She was given Januvia but she did not take this because of expense of the medication   She was referred to the nurse educator and started on basal insulin to improve her control on 06/07/14 This was changed to premixed insulin in 9/15 because of continued poor control and her not being able to afford NovoLog mix insulin or Invokana  She stopped taking insulin in 1/16 because of the cost and was taking only Amaryl.  Recent history:   Hypoglycemic drugs:   Glimepiride  4 mg at supper, metformin ER 500 mg in pm   Since her blood sugars were consistently poorly controlled she was previously on bedtime NPH in addition to Amaryl and metformin Because of cost she did not continue insulin Also on her last visit because of her A1c being 8.7 she was told to start on Actos  Recently fructosamine is 323  Current management, blood sugar patterns and problems identified:  She did not start Actos because it was expensive for her  However at least in the last 3 weeks or so she has tried to do much better with  her diet and blood sugars are generally better including fasting which were sometimes over 200 previously  She has also restarted checking her blood sugars, she had previously not wanted to do so and also did not have lancets  She appears to be more motivated and she thinks she is monitoring her diet with smaller portions and lower fat meals and avoiding any drinks with sugar  Her non-fasting glucose was 159  She is trying to walk regularly, weather permitting   Glucose monitoring with Accu-Chek meter:   Mean values apply above for all meters except median for One Touch  PRE-MEAL Fasting Lunch Dinner Bedtime Overall  Glucose range: 121-158  126   130-142    Mean/median:     146   POST-MEAL PC Breakfast PC Lunch PC Dinner  Glucose range: 136-146     Mean/median:       Side effects from medications have been: Metformin higher doses causes abdominal discomfort Compliance with the medical regimen: fair  Self-care: The diet that the patient has been following is: tries to limit  portions      Meals:  usually 2-3 meals per day. Usually has variable intake in the morning, may have Some carbohydrates at dinner time, will have fruit for snacks  Exercise: walking 1-3 days a week, upto 1 mile       Dietician visit: Most recent: 03/2016 .              CDE visit last in 7/15  Wt Readings from Last 3 Encounters:  01/21/17 158 lb 2 oz (71.7 kg)  12/10/16 159 lb (72.1 kg)  09/09/16 158 lb (71.7 kg)       Glycemic control:   Lab Results  Component Value Date   HGBA1C 8.7 (H) 12/05/2016   HGBA1C 7.1 (H) 09/04/2016   HGBA1C 7.4 (H) 05/17/2016   Lab Results  Component Value Date   MICROALBUR 1.8 01/08/2016   LDLCALC 135 (H) 12/05/2016   CREATININE 0.99 12/05/2016    Lab on 01/17/2017  Component Date Value Ref Range Status  . Fructosamine 01/17/2017 323* 0 - 285 umol/L Final   Comment: Published reference interval for apparently healthy subjects between age 41 and 42 is  69 - 285 umol/L and in a poorly controlled diabetic population is 228 - 563 umol/L with a mean of 396 umol/L.   Marland Kitchen Glucose, Bld 01/17/2017 159* 70 - 99 mg/dL Final     Weight history:  Previous range upto 172   Wt Readings from Last 3 Encounters:  01/21/17 158 lb 2 oz (71.7 kg)  12/10/16 159 lb (72.1 kg)  09/09/16 158 lb (71.7 kg)    Allergies as of 01/21/2017      Reactions   Aspirin    REACTION: nervous   Beef-derived Products    Celecoxib    REACTION: joints swell   Milk-related Compounds    Mold Extract [trichophyton]    Nsaids    REACTION: sweling   Peanuts [peanut Oil]    Shellfish Allergy    Sulfamethoxazole    REACTION: urticaria (hives)      Medication List       Accurate as of 01/21/17  3:23 PM. Always use your most recent med list.          ACCU-CHEK AVIVA PLUS test strip Generic drug:  glucose blood USE AS DIRECTED TO CHECK BLOOD SUGAR ONCE DAILY   ACCU-CHEK SOFTCLIX LANCETS lancets USE AS DIRECTED EVERY DAY   atenolol-chlorthalidone 50-25 MG tablet Commonly known as:  TENORETIC TAKE 1 TABLET BY MOUTH DAILY.   calcium citrate-vitamin D 315-200 MG-UNIT tablet Commonly known as:  CITRACAL+D Take 1 tablet by mouth daily.   cholecalciferol 1000 units tablet Commonly known as:  VITAMIN D Take 2,000 Units by mouth daily.   glimepiride 4 MG tablet Commonly known as:  AMARYL TAKE 1 TABLET BY MOUTH AT DINNER AND 1/2 TABLET AT BEDTIME   insulin NPH Human 100 UNIT/ML injection Commonly known as:  NOVOLIN N RELION 6 units daily at bedtime   INSULIN SYRINGE .3CC/30GX5/16" 30G X 5/16" 0.3 ML Misc Use to inject insulin 1 time per day   loratadine 10 MG tablet Commonly known as:  CLARITIN Take 1 tablet (10 mg total) by mouth 2 (two) times daily as needed for allergies.   metFORMIN 500 MG 24 hr tablet Commonly known as:  GLUCOPHAGE-XR TAKE 2 TABLETS EVERY DAY WITH SUPPER   omeprazole 20 MG capsule Commonly known as:  PRILOSEC Take 20 mg by  mouth daily.   pioglitazone 15 MG tablet Commonly known as:  ACTOS Take 1 tablet (15 mg total) by mouth daily.   pravastatin 40 MG tablet Commonly known as:  PRAVACHOL Take 1 tablet (40 mg total) by mouth daily.  SE-TAN PLUS 162-115.2-1 MG Caps TAKE 1 CAPSULE BY MOUTH DAILY.   spironolactone 50 MG tablet Commonly known as:  ALDACTONE TAKE 1 TABLET BY MOUTH ONCE DAILY       Allergies:  Allergies  Allergen Reactions  . Aspirin     REACTION: nervous  . Beef-Derived Products   . Celecoxib     REACTION: joints swell  . Milk-Related Compounds   . Mold Extract [Trichophyton]   . Nsaids     REACTION: sweling  . Peanuts [Peanut Oil]   . Shellfish Allergy   . Sulfamethoxazole     REACTION: urticaria (hives)    Past Medical History:  Diagnosis Date  . Allergy   . Arthritis   . Diabetes mellitus without complication ([place])   . Hyperlipidemia   . Hypertension     Past Surgical History:  Procedure Laterality Date  . ABDOMINAL HYSTERECTOMY  1994  . COLONOSCOPY  2008  . FLEXIBLE SIGMOIDOSCOPY  2003    Family History  Problem Relation Age of Onset  . Hypertension Sister   . Stroke    . Diabetes Father   . Hypertension Father   . Stroke Father   . Diabetes Sister   . Diabetes Brother   . Hypertension Brother   . Stroke Brother   . Heart disease Neg Hx     Social History:  reports that she has never smoked. She has never used smokeless tobacco. She reports that she does not drink alcohol or use drugs.    Review of Systems   Retinal exam: Most recent:05/2015  With the optometrist.   HYPERTENSION: She takes a half a tablet of atenolol/chlorthalidone  HYPOKALEMIA: Her potassium Had been low consistently in the past This is  controlled with taking Aldactone 50 mg   Lab Results  Component Value Date   CREATININE 0.99 12/05/2016   BUN 20 12/05/2016   NA 137 12/05/2016   K 4.0 12/05/2016   CL 98 12/05/2016   CO2 28 12/05/2016    Lipids: She was told to  start Pravastatin on Previous visits but she thinks it is too expensive LDL increased again as of 1/18  She does have family history of CVA but no known history of CAD       Lab Results  Component Value Date   CHOL 208 (H) 12/05/2016   HDL 52.30 12/05/2016   LDLCALC 135 (H) 12/05/2016   LDLDIRECT 130.0 05/17/2016   TRIG 103.0 12/05/2016   CHOLHDL 4 12/05/2016    Last foot exam was in 01/2017, also had no symptoms of neuropathy   Physical Examination:  BP 128/76 (BP Location: Left Arm, Patient Position: Sitting, Cuff Size: Normal)   Pulse 84   Temp 97.8 F (36.6 C) (Oral)   Resp 16   Wt 158 lb 2 oz (71.7 kg)   SpO2 96%   BMI 26.31 kg/m   No ankle edema present  Diabetic Foot Exam - Simple   Simple Foot Form Diabetic Foot exam was performed with the following findings:  Yes 01/21/2017  3:31 PM  Visual Inspection No deformities, no ulcerations, no other skin breakdown bilaterally:  Yes Sensation Testing Intact to touch and monofilament testing bilaterally:  Yes Pulse Check Posterior Tibialis and Dorsalis pulse intact bilaterally:  Yes Comments        ASSESSMENT:  Diabetes type 2   See history of present illness for detailed discussion of  current management, blood sugar patterns and problems identified  Although her A1c was  8.7 earlier this year her blood sugars are looking better She is checking mostly fasting readings which are averaging about 145, previously was having readings over 200 also in the morning She may have improved her diet since her last visit is causing better blood sugars Again she is refusing to take any other medication besides Amaryl and metformin  She has only a few readings later in the day which are also fairly good including the lab  Hypertension: Blood pressure is controlled  LIPIDS: LDL is  high with her not taking any medications, again says that she cannot afford pravastatin   PLAN:    She will continue Amaryl and  metformin  More readings after meals  Continue watching diet and try to increase her walking also  Follow-up in 3 months  Needs regular eye exams    There are no Patient Instructions on file for this visit.       Natarsha Hurwitz 01/21/2017, 3:23 PM   Note: This office note was prepared with Dragon voice recognition system technology. Any transcriptional errors that result from this process are unintentional.

## 2017-01-21 NOTE — Patient Instructions (Signed)
Check blood sugars on waking up  2x weekly  Also check blood sugars about 2 hours after a meal and do this after different meals by rotation  Recommended blood sugar levels on waking up is 90-130 and about 2 hours after meal is 130-160  Please bring your blood sugar monitor to each visit, thank you  

## 2017-01-22 ENCOUNTER — Ambulatory Visit (INDEPENDENT_AMBULATORY_CARE_PROVIDER_SITE_OTHER): Payer: Medicare Other

## 2017-01-22 VITALS — BP 134/84 | HR 72 | Ht 65.0 in | Wt 158.0 lb

## 2017-01-22 DIAGNOSIS — Z0000 Encounter for general adult medical examination without abnormal findings: Secondary | ICD-10-CM

## 2017-01-22 DIAGNOSIS — Z1231 Encounter for screening mammogram for malignant neoplasm of breast: Secondary | ICD-10-CM

## 2017-01-22 LAB — MICROALBUMIN / CREATININE URINE RATIO
Creatinine,U: 349.9 mg/dL
Microalb Creat Ratio: 0.7 mg/g (ref 0.0–30.0)
Microalb, Ur: 2.3 mg/dL — ABNORMAL HIGH (ref 0.0–1.9)

## 2017-01-22 NOTE — Patient Instructions (Addendum)
Pam Obrien , Thank you for taking time to come for your Medicare Wellness Visit. I appreciate your ongoing commitment to your health goals. Please review the following plan we discussed and let me know if I can assist you in the future.   Will make apt with Cory to fup on labs and appetite suppression if this is the case.   Medicare now request all "baby boomers" test for possible exposure to Hepatitis C. Many may have been exposed due to dental work, tatoo's, vaccinations when young. The Hepatitis C virus is dormant for many years and then sometimes will cause liver cancer. If you gave blood in the past 15 years, you were most likely checked for Hep C. If you rec'd blood; you may want to consider testing or if you are high risk for any other reason.    Mammogram; will order today; [facility] will [facility]  Eye exam will schedule an eye exam   Has not had the flu vaccine this year and she declines  She states she was sick with the flu vaccine      A Tetanus is recommended every 10 years. Medicare covers a tetanus if you have a cut or wound; otherwise, there may be a charge. If you had not had a tetanus with pertusses, known as the Tdap, you can take this anytime.      These are the goals we discussed: Goals    . patient          Explore new recipes Different when working;  Try new foods;  Will see [name] regarding appetite changes     . Prevent Falls    . Reduce sugar intake to X grams per day       This is a list of the screening recommended for you and due dates:  Health Maintenance  Topic Date Due  .  Hepatitis C: One time screening is recommended by Center for Disease Control  (CDC) for  adults born from [birth year] through 1966.   September 07, 1946  . Mammogram  03/04/2011  . Eye exam for diabetics  05/25/2016  . Tetanus Vaccine  11/11/2016  . Urine Protein Check  01/07/2017  . Flu Shot  06/11/2017*  . Colon Cancer Screening  04/27/2017  . Hemoglobin A1C  06/04/2017   . Complete foot exam   01/21/2018  . DEXA scan (bone density measurement)  Completed  . Pneumonia vaccines  Completed  *Topic was postponed. The date shown is not the original due date.        Fall Prevention in the Home Falls can cause injuries. They can happen to people of all ages. There are many things you can do to make your home safe and to help prevent falls. What can I do on the outside of my home?  Regularly fix the edges of walkways and driveways and fix any cracks.  Remove anything that might make you trip as you walk through a door, such as a raised step or threshold.  Trim any bushes or trees on the path to your home.  Use bright outdoor lighting.  Clear any walking paths of anything that might make someone trip, such as rocks or tools.  Regularly check to see if handrails are loose or broken. Make sure that both sides of any steps have handrails.  Any raised decks and porches should have guardrails on the edges.  Have any leaves, snow, or ice cleared regularly.  Use sand or salt on  walking paths during winter.  Clean up any spills in your garage right away. This includes oil or grease spills. What can I do in the bathroom?  Use night lights.  Install grab bars by the toilet and in the tub and shower. Do not use towel bars as grab bars.  Use non-skid mats or decals in the tub or shower.  If you need to sit down in the shower, use a plastic, non-slip stool.  Keep the floor dry. Clean up any water that spills on the floor as soon as it happens.  Remove soap buildup in the tub or shower regularly.  Attach bath mats securely with double-sided non-slip rug tape.  Do not have throw rugs and other things on the floor that can make you trip. What can I do in the bedroom?  Use night lights.  Make sure that you have a light by your bed that is easy to reach.  Do not use any sheets or blankets that are too big for your bed. They should not hang down onto  the floor.  Have a firm chair that has side arms. You can use this for support while you get dressed.  Do not have throw rugs and other things on the floor that can make you trip. What can I do in the kitchen?  Clean up any spills right away.  Avoid walking on wet floors.  Keep items that you use a lot in easy-to-reach places.  If you need to reach something above you, use a strong step stool that has a grab bar.  Keep electrical cords out of the way.  Do not use floor polish or wax that makes floors slippery. If you must use wax, use non-skid floor wax.  Do not have throw rugs and other things on the floor that can make you trip. What can I do with my stairs?  Do not leave any items on the stairs.  Make sure that there are handrails on both sides of the stairs and use them. Fix handrails that are broken or loose. Make sure that handrails are as long as the stairways.  Check any carpeting to make sure that it is firmly attached to the stairs. Fix any carpet that is loose or worn.  Avoid having throw rugs at the top or bottom of the stairs. If you do have throw rugs, attach them to the floor with carpet tape.  Make sure that you have a light switch at the top of the stairs and the bottom of the stairs. If you do not have them, ask someone to add them for you. What else can I do to help prevent falls?  Wear shoes that:  Do not have high heels.  Have rubber bottoms.  Are comfortable and fit you well.  Are closed at the toe. Do not wear sandals.  If you use a stepladder:  Make sure that it is fully opened. Do not climb a closed stepladder.  Make sure that both sides of the stepladder are locked into place.  Ask someone to hold it for you, if possible.  Clearly mark and make sure that you can see:  Any grab bars or handrails.  First and last steps.  Where the edge of each step is.  Use tools that help you move around (mobility aids) if they are needed. These  include:  Canes.  Walkers.  Scooters.  Crutches.  Turn on the lights when you go into a dark area. Replace  any light bulbs as soon as they burn out.  Set up your furniture so you have a clear path. Avoid moving your furniture around.  If any of your floors are uneven, fix them.  If there are any pets around you, be aware of where they are.  Review your medicines with your doctor. Some medicines can make you feel dizzy. This can increase your chance of falling. Ask your doctor what other things that you can do to help prevent falls. This information is not intended to replace advice given to you by your health care provider. Make sure you discuss any questions you have with your health care provider. Document Released: 08/24/2009 Document Revised: 04/04/2016 Document Reviewed: 12/02/2014 Elsevier Interactive Patient Education  2017 [place] Maintenance, Female Adopting a healthy lifestyle and getting preventive care can go a long way to promote health and wellness. Talk with your health care provider about what schedule of regular examinations is right for you. This is a good chance for you to check in with your provider about disease prevention and staying healthy. In between checkups, there are plenty of things you can do on your own. Experts have done a lot of research about which lifestyle changes and preventive measures are most likely to keep you healthy. Ask your health care provider for more information. Weight and diet Eat a healthy diet  Be sure to include plenty of vegetables, fruits, low-fat dairy products, and lean protein.  Do not eat a lot of foods high in solid fats, added sugars, or salt.  Get regular exercise. This is one of the most important things you can do for your health.  Most adults should exercise for at least 150 minutes each week. The exercise should increase your heart rate and make you sweat (moderate-intensity exercise).  Most adults  should also do strengthening exercises at least twice a week. This is in addition to the moderate-intensity exercise. Maintain a healthy weight  Body mass index (BMI) is a measurement that can be used to identify possible weight problems. It estimates body fat based on height and weight. Your health care provider can help determine your BMI and help you achieve or maintain a healthy weight.  For females 20 years of age and older:  A BMI below 18.5 is considered underweight.  A BMI of 18.5 to 24.9 is normal.  A BMI of 25 to 29.9 is considered overweight.  A BMI of 30 and above is considered obese. Watch levels of cholesterol and blood lipids  You should start having your blood tested for lipids and cholesterol at 72 years of age, then have this test every 5 years.  You may need to have your cholesterol levels checked more often if:  Your lipid or cholesterol levels are high.  You are older than 72 years of age.  You are at high risk for heart disease. Cancer screening Lung Cancer  Lung cancer screening is recommended for adults 72-27 years old who are at high risk for lung cancer because of a history of smoking.  A yearly low-dose CT scan of the lungs is recommended for people who:  Currently smoke.  Have quit within the past 15 years.  Have at least a 30-pack-year history of smoking. A pack year is smoking an average of one pack of cigarettes a day for 1 year.  Yearly screening should continue until it has been 15 years since you quit.  Yearly screening should stop if you develop a  health problem that would prevent you from having lung cancer treatment. Breast Cancer  Practice breast self-awareness. This means understanding how your breasts normally appear and feel.  It also means doing regular breast self-exams. Let your health care provider know about any changes, no matter how small.  If you are in your 20s or 30s, you should have a clinical breast exam (CBE) by a  health care provider every 1-3 years as part of a regular health exam.  If you are 51 or older, have a CBE every year. Also consider having a breast X-ray (mammogram) every year.  If you have a family history of breast cancer, talk to your health care provider about genetic screening.  If you are at high risk for breast cancer, talk to your health care provider about having an MRI and a mammogram every year.  Breast cancer gene (BRCA) assessment is recommended for women who have family members with BRCA-related cancers. BRCA-related cancers include:  Breast.  Ovarian.  Tubal.  Peritoneal cancers.  Results of the assessment will determine the need for genetic counseling and BRCA1 and BRCA2 testing. Cervical Cancer  Your health care provider may recommend that you be screened regularly for cancer of the pelvic organs (ovaries, uterus, and vagina). This screening involves a pelvic examination, including checking for microscopic changes to the surface of your cervix (Pap test). You may be encouraged to have this screening done every 3 years, beginning at age 49.  For women ages 77-65, health care providers may recommend pelvic exams and Pap testing every 3 years, or they may recommend the Pap and pelvic exam, combined with testing for human papilloma virus (HPV), every 5 years. Some types of HPV increase your risk of cervical cancer. Testing for HPV may also be done on women of any age with unclear Pap test results.  Other health care providers may not recommend any screening for nonpregnant women who are considered low risk for pelvic cancer and who do not have symptoms. Ask your health care provider if a screening pelvic exam is right for you.  If you have had past treatment for cervical cancer or a condition that could lead to cancer, you need Pap tests and screening for cancer for at least 20 years after your treatment. If Pap tests have been discontinued, your risk factors (such as having  a new sexual partner) need to be reassessed to determine if screening should resume. Some women have medical problems that increase the chance of getting cervical cancer. In these cases, your health care provider may recommend more frequent screening and Pap tests. Colorectal Cancer  This type of cancer can be detected and often prevented.  Routine colorectal cancer screening usually begins at 72 years of age and continues through 72 years of age.  Your health care provider may recommend screening at an earlier age if you have risk factors for colon cancer.  Your health care provider may also recommend using home test kits to check for hidden blood in the stool.  A small camera at the end of a tube can be used to examine your colon directly (sigmoidoscopy or colonoscopy). This is done to check for the earliest forms of colorectal cancer.  Routine screening usually begins at age 64.  Direct examination of the colon should be repeated every 5-10 years through 72 years of age. However, you may need to be screened more often if early forms of precancerous polyps or small growths are found. Skin Cancer  Check  your skin from head to toe regularly.  Tell your health care provider about any new moles or changes in moles, especially if there is a change in a mole's shape or color.  Also tell your health care provider if you have a mole that is larger than the size of a pencil eraser.  Always use sunscreen. Apply sunscreen liberally and repeatedly throughout the day.  Protect yourself by wearing long sleeves, pants, a wide-brimmed hat, and sunglasses whenever you are outside. Heart disease, diabetes, and high blood pressure  High blood pressure causes heart disease and increases the risk of stroke. High blood pressure is more likely to develop in:  People who have blood pressure in the high end of the normal range (130-139/85-89 mm Hg).  People who are overweight or obese.  People who are  African American.  If you are 13-64 years of age, have your blood pressure checked every 3-5 years. If you are 67 years of age or older, have your blood pressure checked every year. You should have your blood pressure measured twice-once when you are at a hospital or clinic, and once when you are not at a hospital or clinic. Record the average of the two measurements. To check your blood pressure when you are not at a hospital or clinic, you can use:  An automated blood pressure machine at a pharmacy.  A home blood pressure monitor.  If you are between 58 years and 27 years old, ask your health care provider if you should take aspirin to prevent strokes.  Have regular diabetes screenings. This involves taking a blood sample to check your fasting blood sugar level.  If you are at a normal weight and have a low risk for diabetes, have this test once every three years after 72 years of age.  If you are overweight and have a high risk for diabetes, consider being tested at a younger age or more often. Preventing infection Hepatitis B  If you have a higher risk for hepatitis B, you should be screened for this virus. You are considered at high risk for hepatitis B if:  You were born in a country where hepatitis B is common. Ask your health care provider which countries are considered high risk.  Your parents were born in a high-risk country, and you have not been immunized against hepatitis B (hepatitis B vaccine).  You have HIV or AIDS.  You use needles to inject street drugs.  You live with someone who has hepatitis B.  You have had sex with someone who has hepatitis B.  You get hemodialysis treatment.  You take certain medicines for conditions, including cancer, organ transplantation, and autoimmune conditions. Hepatitis C  Blood testing is recommended for:  Everyone born from [birth year] through 1966.  Anyone with known risk factors for hepatitis C. Sexually transmitted infections  (STIs)  You should be screened for sexually transmitted infections (STIs) including gonorrhea and chlamydia if:  You are sexually active and are younger than 72 years of age.  You are older than 72 years of age and your health care provider tells you that you are at risk for this type of infection.  Your sexual activity has changed since you were last screened and you are at an increased risk for chlamydia or gonorrhea. Ask your health care provider if you are at risk.  If you do not have HIV, but are at risk, it may be recommended that you take a prescription medicine daily to prevent  HIV infection. This is called pre-exposure prophylaxis (PrEP). You are considered at risk if:  You are sexually active and do not regularly use condoms or know the HIV status of your partner(s).  You take drugs by injection.  You are sexually active with a partner who has HIV. Talk with your health care provider about whether you are at high risk of being infected with HIV. If you choose to begin PrEP, you should first be tested for HIV. You should then be tested every 3 months for as long as you are taking PrEP. Pregnancy  If you are premenopausal and you may become pregnant, ask your health care provider about preconception counseling.  If you may become pregnant, take 400 to 800 micrograms (mcg) of folic acid every day.  If you want to prevent pregnancy, talk to your health care provider about birth control (contraception). Osteoporosis and menopause  Osteoporosis is a disease in which the bones lose minerals and strength with aging. This can result in serious bone fractures. Your risk for osteoporosis can be identified using a bone density scan.  If you are 50 years of age or older, or if you are at risk for osteoporosis and fractures, ask your health care provider if you should be screened.  Ask your health care provider whether you should take a calcium or vitamin D supplement to lower your risk  for osteoporosis.  Menopause may have certain physical symptoms and risks.  Hormone replacement therapy may reduce some of these symptoms and risks. Talk to your health care provider about whether hormone replacement therapy is right for you. Follow these instructions at home:  Schedule regular health, dental, and eye exams.  Stay current with your immunizations.  Do not use any tobacco products including cigarettes, chewing tobacco, or electronic cigarettes.  If you are pregnant, do not drink alcohol.  If you are breastfeeding, limit how much and how often you drink alcohol.  Limit alcohol intake to no more than 1 drink per day for nonpregnant women. One drink equals 12 ounces of beer, 5 ounces of wine, or 1 ounces of hard liquor.  Do not use street drugs.  Do not share needles.  Ask your health care provider for help if you need support or information about quitting drugs.  Tell your health care provider if you often feel depressed.  Tell your health care provider if you have ever been abused or do not feel safe at home. This information is not intended to replace advice given to you by your health care provider. Make sure you discuss any questions you have with your health care provider. Document Released: 05/13/2011 Document Revised: 04/04/2016 Document Reviewed: 08/01/2015 Elsevier Interactive Patient Education  2017 [employer].

## 2017-01-22 NOTE — Progress Notes (Signed)
Subjective:   Pam Obrien is a 71 y.o. female who presents for Medicare Annual (Subsequent) preventive examination.  Hs of allergies;  Peanuts and other  Was taking allergy shots; taking her allergy meds po now and no longer taking the shots.  BS checks  X 2 per day - ate a banana this am and Bs was  196;  She felt sick on her stomach but generally does eat breakfast  Tries to eat between 6 and 7' but generally runs 130 in in the am Dr Dwyane Dee following   Frustrated with diet Breakfast; ate a banana, but generally eats eggs but is tired of them; tired of oatmeal; tired of chicken and fish Eat sweet potatoes Can eat a baked potato Broccoli, cabbage and greens Can eat fruits  She has to stay within the boundary of her allergies and diabetes which is difficult Does eat yogurt or ice cream on occasion; Appetite is depressed and agreed to see [name] to discuss  Also states she feels sick on her stomach at times in the am but has difficulty deciding what she is hungry for.    Exercise  Likes to walk about 30 minutes per day per Dr. Dwyane Dee Does walk her german shepherd If sitting, may take weight and work with arms  Rather be outside   [place] Separated now;  No falls  Children grown and gone  Talks to them every day; 2 of them  check on her daily   Lives in one level home Safe in neighborhood and has one level home  Stress: paying taxes    Advanced Directive Has talked to her children and expressed her wishes Educated provided regarding the [place] form but did not wish to take this home today. May consider at a later time  Health Maintenance Due  Topic Date Due  . Hepatitis C Screening  08/03/1946  . MAMMOGRAM  03/04/2011  . OPHTHALMOLOGY EXAM  05/25/2016  . TETANUS/TDAP  11/11/2016  . URINE MICROALBUMIN  01/07/2017   Medicare now request all "baby boomers" test for possible exposure to Hepatitis C. Many may have been exposed due to dental work,  tatoo's, vaccinations when young. The Hepatitis C virus is dormant for many years and then sometimes will cause liver cancer. If you gave blood in the past 15 years, you were most likely checked for Hep C. If you rec'd blood; you may want to consider testing or if you are high risk for any other reason.    Mammogram; has not had one since she quit work  Did go to [facility];  Can order today   Eye exam will schedule an eye exam   Has not had the flu vaccine this year and she declines  She states she was sick with the flu vaccine    Cardiac Risk Factors include: advanced age (>56men, >65 women);hypertension A Tetanus is recommended every 10 years. Medicare covers a tetanus if you have a cut or wound; otherwise, there may be a charge. If you had not had a tetanus with pertusses, known as the Tdap, you can take this anytime.   Dexa; bone density 11/2010 was normal     Objective:     Vitals: BP 134/84   Pulse 72   Ht 5\' 5"  (1.651 m)   Wt 158 lb (71.7 kg)   SpO2 98%   BMI 26.29 kg/m   Body mass index is 26.29 kg/m.   Tobacco History  Smoking Status  .  Never Smoker  Smokeless Tobacco  . Never Used     Counseling given: Yes   Past Medical History:  Diagnosis Date  . Allergy   . Arthritis   . Diabetes mellitus without complication ([place])   . Hyperlipidemia   . Hypertension    Mother had cancer - not sure what type; under her rib cage Father had HTN;   Past Surgical History:  Procedure Laterality Date  . ABDOMINAL HYSTERECTOMY  1994  . COLONOSCOPY  2008  . FLEXIBLE SIGMOIDOSCOPY  2003   Family History  Problem Relation Age of Onset  . Hypertension Sister   . Stroke    . Diabetes Father   . Hypertension Father   . Stroke Father   . Diabetes Sister   . Diabetes Brother   . Hypertension Brother   . Stroke Brother   . Heart disease Neg Hx    History  Sexual Activity  . Sexual activity: Not on file    Outpatient Encounter Prescriptions as of 01/22/2017    Medication Sig  . ACCU-CHEK AVIVA PLUS test strip USE AS DIRECTED TO CHECK BLOOD SUGAR ONCE DAILY  . ACCU-CHEK SOFTCLIX LANCETS lancets USE AS DIRECTED EVERY DAY  . atenolol-chlorthalidone (TENORETIC) 50-25 MG tablet TAKE 1 TABLET BY MOUTH DAILY.  . calcium citrate-vitamin D (CITRACAL+D) 315-200 MG-UNIT per tablet Take 1 tablet by mouth daily.  . cholecalciferol (VITAMIN D) 1000 UNITS tablet Take 2,000 Units by mouth daily.  Marland Kitchen FeFum-FePo-FA-B Cmp-C-Zn-Mn-Cu (SE-TAN PLUS) 162-115.2-1 MG CAPS TAKE 1 CAPSULE BY MOUTH DAILY.  Marland Kitchen glimepiride (AMARYL) 4 MG tablet TAKE 1 TABLET BY MOUTH AT DINNER AND 1/2 TABLET AT BEDTIME (Patient taking differently: TAKE 1 TABLET BY MOUTH AT DINNER)  . loratadine (CLARITIN) 10 MG tablet Take 1 tablet (10 mg total) by mouth 2 (two) times daily as needed for allergies.  . metFORMIN (GLUCOPHAGE-XR) 500 MG 24 hr tablet TAKE 2 TABLETS EVERY DAY WITH SUPPER  . omeprazole (PRILOSEC) 20 MG capsule Take 20 mg by mouth daily.  . pioglitazone (ACTOS) 15 MG tablet Take 1 tablet (15 mg total) by mouth daily.  . pravastatin (PRAVACHOL) 40 MG tablet Take 1 tablet (40 mg total) by mouth daily.  Marland Kitchen spironolactone (ALDACTONE) 50 MG tablet TAKE 1 TABLET BY MOUTH ONCE DAILY  . insulin NPH Human (NOVOLIN N RELION) 100 UNIT/ML injection 6 units daily at bedtime (Patient not taking: Reported on 12/10/2016)  . Insulin Syringe-Needle U-100 (INSULIN SYRINGE .3CC/30GX5/16") 30G X 5/16" 0.3 ML MISC Use to inject insulin 1 time per day (Patient not taking: Reported on 01/21/2017)   No facility-administered encounter medications on file as of 01/22/2017.     Activities of Daily Living In your present state of health, do you have any difficulty performing the following activities: 01/22/2017  Vision? (No Data)  Difficulty concentrating or making decisions? N  Walking or climbing stairs? N  Dressing or bathing? N  Doing errands, shopping? N  Preparing Food and eating ? N  Using the Toilet? N  In  the past six months, have you accidently leaked urine? N  Do you have problems with loss of bowel control? N  Managing your Medications? N  Managing your Finances? N  Housekeeping or managing your Housekeeping? N  Some recent data might be hidden    Patient Care Team: [name], NP as PCP - General (Family Medicine)    Assessment:   Exercise Activities and Dietary recommendations Current Exercise Habits: Home exercise routine, Type of exercise: walking,  Time (Minutes): 30, Frequency (Times/Week): 5, Weekly Exercise (Minutes/Week): 150  Goals    . patient          Explore new recipes Different when working;  Try new foods;  Will see [name] regarding appetite changes     . Prevent Falls    . Reduce sugar intake to X grams per day      Fall Risk Fall Risk  01/22/2017 03/22/2016 08/07/2015 01/06/2015 04/12/2013  Falls in the past year? No No No No No   Depression Screen PHQ 2/9 Scores 01/22/2017 03/22/2016 08/07/2015 01/06/2015  PHQ - 2 Score 0 0 0 0     Cognitive Function MMSE - Mini Mental State Exam 01/22/2017  Not completed: (No Data)    no issues with daily living     Immunization History  Administered Date(s) Administered  . Influenza Split 09/03/2012  . Pneumococcal Conjugate-13 06/06/2015  . Pneumococcal Polysaccharide-23 12/28/2009  . Td 11/11/2006   Screening Tests Health Maintenance  Topic Date Due  . Hepatitis C Screening  July 13, 1946  . MAMMOGRAM  03/04/2011  . OPHTHALMOLOGY EXAM  05/25/2016  . TETANUS/TDAP  11/11/2016  . URINE MICROALBUMIN  01/07/2017  . INFLUENZA VACCINE  06/11/2017 (Originally 06/11/2016)  . COLONOSCOPY  04/27/2017  . HEMOGLOBIN A1C  06/04/2017  . FOOT EXAM  01/21/2018  . DEXA SCAN  Completed  . PNA vac Low Risk Adult  Completed      Plan:      PCP Notes  Health Maintenance Reviewed Hep c; will discuss with [name] but declines at present  Agrees to have mammogram and order placed  Agrees to schedule her eye exam   Declines  Tetanus but feels she had this 5 years ago  Declines flu   Agreed to check [facility] her area for a report of her vaccination hx.   Abnormal Screens none  Referrals  As stated above  Patient concerns; C/o about her appetite;  Has multiple allergies and is tired of her diet. Will fup with [facility] for blood work and review of issues impacting her appetite   Nurse Concerns; For fup regarding appetite  Also c/o of some nausea in the am  Everything "taste" funny;   Next PCP scheduled when leaving today   Agreed to schedule AWV next year   During the course of the visit the patient was educated and counseled about the following appropriate screening and preventive services:   Vaccines to include Pneumoccal, Influenza, Hepatitis B, Td, Zostavax, HCV  Electrocardiogram  Cardiovascular Disease  Colorectal cancer screening  Bone density screening  Diabetes screening  Glaucoma screening  Mammography/PAP  Nutrition counseling   Patient Instructions (the written plan) was given to the patient.   Wynetta Fines, RN  01/22/2017

## 2017-01-22 NOTE — Addendum Note (Signed)
Addended by: Kaylyn Lim I on: 01/22/2017 08:09 AM   Modules accepted: Orders

## 2017-01-22 NOTE — Progress Notes (Signed)
I have reviewed and agree with this plan  

## 2017-02-25 ENCOUNTER — Other Ambulatory Visit: Payer: Self-pay | Admitting: Adult Health

## 2017-02-25 ENCOUNTER — Other Ambulatory Visit: Payer: Self-pay | Admitting: Endocrinology

## 2017-02-25 ENCOUNTER — Encounter: Payer: Self-pay | Admitting: Gastroenterology

## 2017-02-25 DIAGNOSIS — I10 Essential (primary) hypertension: Secondary | ICD-10-CM

## 2017-02-25 NOTE — Telephone Encounter (Signed)
Ok to refill for 90 days  

## 2017-03-06 ENCOUNTER — Ambulatory Visit: Payer: Medicare Other

## 2017-03-10 ENCOUNTER — Ambulatory Visit: Payer: Medicare Other

## 2017-03-24 ENCOUNTER — Other Ambulatory Visit: Payer: Self-pay | Admitting: Endocrinology

## 2017-03-25 NOTE — Telephone Encounter (Signed)
Called to see if pt wanted to schedule awv - left message.  

## 2017-03-26 ENCOUNTER — Ambulatory Visit
Admission: RE | Admit: 2017-03-26 | Discharge: 2017-03-26 | Disposition: A | Discharge status: Home / Self Care | Payer: Medicare Other | Source: Ambulatory Visit | Attending: Adult Health | Admitting: Adult Health

## 2017-03-26 DIAGNOSIS — Z1231 Encounter for screening mammogram for malignant neoplasm of breast: Secondary | ICD-10-CM

## 2017-03-28 ENCOUNTER — Other Ambulatory Visit: Payer: Self-pay | Admitting: Adult Health

## 2017-03-28 DIAGNOSIS — R928 Other abnormal and inconclusive findings on diagnostic imaging of breast: Secondary | ICD-10-CM

## 2017-04-01 ENCOUNTER — Ambulatory Visit
Admission: RE | Admit: 2017-04-01 | Discharge: 2017-04-01 | Disposition: A | Discharge status: Home / Self Care | Payer: Medicare Other | Source: Ambulatory Visit | Attending: Adult Health | Admitting: Adult Health

## 2017-04-01 DIAGNOSIS — R928 Other abnormal and inconclusive findings on diagnostic imaging of breast: Secondary | ICD-10-CM | POA: Diagnosis not present

## 2017-04-03 ENCOUNTER — Other Ambulatory Visit: Payer: Self-pay | Admitting: Endocrinology

## 2017-04-18 ENCOUNTER — Other Ambulatory Visit: Payer: Medicare Other

## 2017-04-23 ENCOUNTER — Ambulatory Visit: Payer: Medicare Other | Admitting: Endocrinology

## 2017-04-28 DIAGNOSIS — H25813 Combined forms of age-related cataract, bilateral: Secondary | ICD-10-CM | POA: Diagnosis not present

## 2017-04-28 DIAGNOSIS — E119 Type 2 diabetes mellitus without complications: Secondary | ICD-10-CM | POA: Diagnosis not present

## 2017-04-28 DIAGNOSIS — H5203 Hypermetropia, bilateral: Secondary | ICD-10-CM | POA: Diagnosis not present

## 2017-04-28 DIAGNOSIS — Z7984 Long term (current) use of oral hypoglycemic drugs: Secondary | ICD-10-CM | POA: Diagnosis not present

## 2017-04-28 DIAGNOSIS — H52223 Regular astigmatism, bilateral: Secondary | ICD-10-CM | POA: Diagnosis not present

## 2017-04-28 LAB — HM DIABETES EYE EXAM

## 2017-04-30 ENCOUNTER — Other Ambulatory Visit: Payer: Self-pay | Admitting: Endocrinology

## 2017-05-06 ENCOUNTER — Encounter: Payer: Self-pay | Admitting: Family Medicine

## 2017-05-06 ENCOUNTER — Ambulatory Visit: Payer: Medicare Other | Admitting: Endocrinology

## 2017-05-08 ENCOUNTER — Other Ambulatory Visit: Payer: Self-pay | Admitting: Adult Health

## 2017-05-08 DIAGNOSIS — I10 Essential (primary) hypertension: Secondary | ICD-10-CM

## 2017-05-19 ENCOUNTER — Encounter: Payer: Self-pay | Admitting: Gastroenterology

## 2017-05-21 ENCOUNTER — Other Ambulatory Visit (INDEPENDENT_AMBULATORY_CARE_PROVIDER_SITE_OTHER): Payer: Medicare Other

## 2017-05-21 DIAGNOSIS — E1165 Type 2 diabetes mellitus with hyperglycemia: Secondary | ICD-10-CM

## 2017-05-21 LAB — LIPID PANEL
Cholesterol: 175 mg/dL (ref 0–200)
HDL: 55.5 mg/dL (ref >39.00)
LDL Cholesterol: 106 mg/dL — ABNORMAL HIGH (ref 0–99)
NonHDL: 119.86
Total CHOL/HDL Ratio: 3
Triglycerides: 69 mg/dL (ref 0.0–149.0)
VLDL: 13.8 mg/dL (ref 0.0–40.0)

## 2017-05-21 LAB — COMPREHENSIVE METABOLIC PANEL
ALT: 12 U/L (ref 0–35)
AST: 16 U/L (ref 0–37)
Albumin: 4.4 g/dL (ref 3.5–5.2)
Alkaline Phosphatase: 80 U/L (ref 39–117)
BUN: 20 mg/dL (ref 6–23)
CO2: 28 mEq/L (ref 19–32)
Calcium: 10 mg/dL (ref 8.4–10.5)
Chloride: 101 mEq/L (ref 96–112)
Creatinine, Ser: 0.99 mg/dL (ref 0.40–1.20)
GFR: 70.81 mL/min (ref >60.00)
Glucose, Bld: 164 mg/dL — ABNORMAL HIGH (ref 70–99)
Potassium: 4.1 mEq/L (ref 3.5–5.1)
Sodium: 139 mEq/L (ref 135–145)
Total Bilirubin: 0.5 mg/dL (ref 0.2–1.2)
Total Protein: 8.2 g/dL (ref 6.0–8.3)

## 2017-05-21 LAB — HEMOGLOBIN A1C: Hgb A1c MFr Bld: 7.1 % — ABNORMAL HIGH (ref 4.6–6.5)

## 2017-05-26 ENCOUNTER — Ambulatory Visit (INDEPENDENT_AMBULATORY_CARE_PROVIDER_SITE_OTHER): Payer: Medicare Other | Admitting: Endocrinology

## 2017-05-26 ENCOUNTER — Encounter: Payer: Self-pay | Admitting: Endocrinology

## 2017-05-26 VITALS — BP 114/74 | HR 74 | Ht 65.0 in | Wt 155.0 lb

## 2017-05-26 DIAGNOSIS — E1165 Type 2 diabetes mellitus with hyperglycemia: Secondary | ICD-10-CM | POA: Diagnosis not present

## 2017-05-26 NOTE — Patient Instructions (Signed)
Check blood sugars on waking up  2/7 days  Also check blood sugars about 2 hours after a meal and do this after different meals by rotation  Recommended blood sugar levels on waking up is 90-130 and about 2 hours after meal is 130-160  Please bring your blood sugar monitor to each visit, thank you  

## 2017-05-26 NOTE — Progress Notes (Signed)
j         Patient ID: Pam Obrien, female   DOB: June 02, 1946, 71 y.o.   MRN: [id number]    Reason for Appointment: Followup for Type 2 Diabetes   History of Present Illness:          Diagnosis: Type 2 diabetes mellitus, date of diagnosis:  4/13      Past history:  She was initially started on metformin but he thinks that this made her sleepy and had some abdominal discomfort   She also has been tried on other oral hypoglycemic regimens including Jentadueto  since 2014  But she had similar side effects with this  And not clear how regularly she had been taking this until it was discontinued in 5/15  A1c appears to be persistently over 8% Since 2014  She was tried on Invokamet by her PCP but because of excessive urination with this and also some nausea and weakness it was stopped With glyburide alone her blood sugars are poorly controlled and she was referred here for further management She was given Januvia but she did not take this because of expense of the medication   She was referred to the nurse educator and started on basal insulin to improve her control on 06/07/14 This was changed to premixed insulin in 9/15 because of continued poor control and her not being able to afford NovoLog mix insulin or Invokana  She stopped taking insulin in 1/16 because of the cost and was taking only Amaryl.  Recent history:   Hypoglycemic drugs:   Glimepiride 4 mg at supper, metformin ER 500 mg in pm   Since her blood sugars were consistently poorly controlled she was previously on bedtime NPH in addition to Amaryl and metformin Because of cost she did not continue insulin or add another diabetes drugs  Although on her last visit because of her A1c being 8.7 she was told she needed more medications her blood sugars are not improved and A1c is back to 7.1   Current management, blood sugar patterns and problems identified:  She has started watching her diet with controlling portions and  healthier meals and less eating out  She is consistent with cutting back on simple sugars but tends to eat more fruits including grapes  She is checking her blood sugars very sporadically and did not bring her monitor for download  However she thinks that she has checked a few readings either morning or after meals with the highest reading 168  Her lab glucose was 164 fasting  She has tried to do some walking but does not do much if the weather is hot  Continues to take Amaryl and metformin consistently, again generally taking only low doses of metformin because of side effects with larger doses   Glucose monitoring with Accu-Chek meter:   Blood sugars by recall: 94-168    Side effects from medications have been: Metformin higher doses causes abdominal discomfort Compliance with the medical regimen: fair  Self-care: The diet that the patient has been following is: tries to limit  portions      Meals:  usually 2-3 meals per day. Usually has variable intake in the morning toast/fruit, may have Some carbohydrates at dinner time, will have fruit for snacks           Exercise: walking 1-3 days a week, upto 1 mile.        Dietician visit: Most recent: 03/2016 .  CDE visit last in 7/15  Wt Readings from Last 3 Encounters:  05/26/17 155 lb (70.3 kg)  01/22/17 158 lb (71.7 kg)  01/21/17 158 lb 2 oz (71.7 kg)       Glycemic control:   Lab Results  Component Value Date   HGBA1C 7.1 (H) 05/21/2017   HGBA1C 8.7 (H) 12/05/2016   HGBA1C 7.1 (H) 09/04/2016   Lab Results  Component Value Date   MICROALBUR 2.3 (H) 01/22/2017   LDLCALC 106 (H) 05/21/2017   CREATININE 0.99 05/21/2017    Lab on 05/21/2017  Component Date Value Ref Range Status  . Hgb A1c MFr Bld 05/21/2017 7.1* 4.6 - 6.5 % Final   Glycemic Control Guidelines for People with Diabetes:Non Diabetic:  <6%Goal of Therapy: <7%Additional Action Suggested:  >8%   . Sodium 05/21/2017 139  135 - 145 mEq/L Final    . Potassium 05/21/2017 4.1  3.5 - 5.1 mEq/L Final  . Chloride 05/21/2017 101  96 - 112 mEq/L Final  . CO2 05/21/2017 28  19 - 32 mEq/L Final  . Glucose, Bld 05/21/2017 164* 70 - 99 mg/dL Final  . BUN 05/21/2017 20  6 - 23 mg/dL Final  . Creatinine, Ser 05/21/2017 0.99  0.40 - 1.20 mg/dL Final  . Total Bilirubin 05/21/2017 0.5  0.2 - 1.2 mg/dL Final  . Alkaline Phosphatase 05/21/2017 80  39 - 117 U/L Final  . AST 05/21/2017 16  0 - 37 U/L Final  . ALT 05/21/2017 12  0 - 35 U/L Final  . Total Protein 05/21/2017 8.2  6.0 - 8.3 g/dL Final  . Albumin 05/21/2017 4.4  3.5 - 5.2 g/dL Final  . Calcium 05/21/2017 10.0  8.4 - 10.5 mg/dL Final  . GFR 05/21/2017 70.81  >60.00 mL/min Final  . Cholesterol 05/21/2017 175  0 - 200 mg/dL Final   ATP III Classification       Desirable:  < 200 mg/dL               Borderline High:  200 - 239 mg/dL          High:  > = 240 mg/dL  . Triglycerides 05/21/2017 69.0  0.0 - 149.0 mg/dL Final   Normal:  <150 mg/dLBorderline High:  150 - 199 mg/dL  . HDL 05/21/2017 55.50  >39.00 mg/dL Final  . VLDL 05/21/2017 13.8  0.0 - 40.0 mg/dL Final  . LDL Cholesterol 05/21/2017 106* 0 - 99 mg/dL Final  . Total CHOL/HDL Ratio 05/21/2017 3   Final                  Men          Women1/2 Average Risk     3.4          3.3Average Risk          5.0          4.42X Average Risk          9.6          7.13X Average Risk          15.0          11.0                      . NonHDL 05/21/2017 119.86   Final   NOTE:  Non-HDL goal should be 30 mg/dL higher than patient's LDL goal (i.e. LDL goal of < 70 mg/dL, would have non-HDL goal of < 100  mg/dL)     Weight history:  Previous range upto 172   Wt Readings from Last 3 Encounters:  05/26/17 155 lb (70.3 kg)  01/22/17 158 lb (71.7 kg)  01/21/17 158 lb 2 oz (71.7 kg)    Allergies as of 05/26/2017      Reactions   Aspirin    REACTION: nervous   Beef-derived Products    Celecoxib    REACTION: joints swell   Milk-related Compounds     Mold Extract [trichophyton]    Nsaids    REACTION: sweling   Peanuts [peanut Oil]    Shellfish Allergy    Sulfamethoxazole    REACTION: urticaria (hives)      Medication List       Accurate as of 05/26/17  9:08 PM. Always use your most recent med list.          ACCU-CHEK AVIVA PLUS test strip Generic drug:  glucose blood USE AS DIRECTED TO CHECK BLOOD SUGAR ONCE DAILY   ACCU-CHEK SOFTCLIX LANCETS lancets USE AS DIRECTED EVERY DAY   atenolol-chlorthalidone 50-25 MG tablet Commonly known as:  TENORETIC TAKE 1 TABLET BY MOUTH DAILY.   calcium citrate-vitamin D 315-200 MG-UNIT tablet Commonly known as:  CITRACAL+D Take 1 tablet by mouth daily.   cholecalciferol 1000 units tablet Commonly known as:  VITAMIN D Take 2,000 Units by mouth daily.   glimepiride 4 MG tablet Commonly known as:  AMARYL TAKE 1 TABLET BY MOUTH EVERY DAY AT DINNER AND 1/2 TABLET AT BEDTIME   loratadine 10 MG tablet Commonly known as:  CLARITIN Take 1 tablet (10 mg total) by mouth 2 (two) times daily as needed for allergies.   metFORMIN 500 MG 24 hr tablet Commonly known as:  GLUCOPHAGE-XR TAKE 2 TABLETS EVERY DAY WITH SUPPER   omeprazole 20 MG capsule Commonly known as:  PRILOSEC Take 20 mg by mouth daily.   SE-TAN PLUS 162-115.2-1 MG Caps TAKE 1 CAPSULE BY MOUTH DAILY.   spironolactone 50 MG tablet Commonly known as:  ALDACTONE TAKE 1 TABLET BY MOUTH ONCE DAILY       Allergies:  Allergies  Allergen Reactions  . Aspirin     REACTION: nervous  . Beef-Derived Products   . Celecoxib     REACTION: joints swell  . Milk-Related Compounds   . Mold Extract [Trichophyton]   . Nsaids     REACTION: sweling  . Peanuts [Peanut Oil]   . Shellfish Allergy   . Sulfamethoxazole     REACTION: urticaria (hives)    Past Medical History:  Diagnosis Date  . Allergy   . Arthritis   . Diabetes mellitus without complication ([place])   . Hyperlipidemia   . Hypertension     Past Surgical  History:  Procedure Laterality Date  . ABDOMINAL HYSTERECTOMY  1994  . COLONOSCOPY  2008  . FLEXIBLE SIGMOIDOSCOPY  2003    Family History  Problem Relation Age of Onset  . Hypertension Sister   . Stroke Unknown   . Diabetes Father   . Hypertension Father   . Stroke Father   . Diabetes Sister   . Diabetes Brother   . Hypertension Brother   . Stroke Brother   . Heart disease Neg Hx     Social History:  reports that she has never smoked. She has never used smokeless tobacco. She reports that she does not drink alcohol or use drugs.    Review of Systems   Retinal exam: Most recent: 3/18  HYPERTENSION:  She takes a half a tablet of atenolol/chlorthalidone  HYPOKALEMIA: Her potassium had been low consistently prior to starting Aldactone This is  controlled with taking Aldactone 50 mg   Lab Results  Component Value Date   CREATININE 0.99 05/21/2017   BUN 20 05/21/2017   NA 139 05/21/2017   K 4.1 05/21/2017   CL 101 05/21/2017   CO2 28 05/21/2017    Lipids: She was told to start Pravastatin on Previous visits but she Did not start because of cost However her LDL appears to be improving, likely because of improved diet  She does have family history of CVA but no known history of CAD       Lab Results  Component Value Date   CHOL 175 05/21/2017   HDL 55.50 05/21/2017   LDLCALC 106 (H) 05/21/2017   LDLDIRECT 130.0 05/17/2016   TRIG 69.0 05/21/2017   CHOLHDL 3 05/21/2017    Last foot exam was in 01/2017, also had no symptoms of neuropathy   Physical Examination:  BP 114/74   Pulse 74   Ht 5\' 5"  (1.651 m)   Wt 155 lb (70.3 kg)   SpO2 98%   BMI 25.79 kg/m   No ankle edema present      ASSESSMENT:  Diabetes type 2   See history of present illness for detailed discussion of  current management, blood sugar patterns and problems identified  Although her A1c was 8.7 earlier this year her blood sugars are looking better with A1c now 7.1  Her blood sugar  improvement is likely to be related to her more consistently improving her diet and eating healthier meals, cutting back on portions and trying to walk as much as possible She has been reluctant to take any brand-name medications Previously on NPH insulin because of high fasting readings and she does not think her fasting readings are high consistently despite lab glucose of 164  Hypertension: Blood pressure is controlled as also potassium  LIPIDS: LDL is  improving, generally improving her diet   PLAN:    She will continue Amaryl and metformin unchanged  She will rotate the times of her monitoring and bring monitor on the next visit  Encouraged her to walk indoors if she cannot go out in the heat  A1c in another 3-4 months    Patient Instructions  Check blood sugars on waking up  2/7 days  Also check blood sugars about 2 hours after a meal and do this after different meals by rotation  Recommended blood sugar levels on waking up is 90-130 and about 2 hours after meal is 130-160  Please bring your blood sugar monitor to each visit, thank you         [facility] 05/26/2017, 9:08 PM   Note: This office note was prepared with Dragon voice recognition system technology. Any transcriptional errors that result from this process are unintentional.

## 2017-05-27 ENCOUNTER — Encounter: Payer: Self-pay | Admitting: Adult Health

## 2017-05-27 ENCOUNTER — Ambulatory Visit (INDEPENDENT_AMBULATORY_CARE_PROVIDER_SITE_OTHER): Payer: Medicare Other | Admitting: Adult Health

## 2017-05-27 VITALS — BP 120/68 | HR 66 | Temp 98.2°F | Ht 65.0 in | Wt 155.4 lb

## 2017-05-27 DIAGNOSIS — E1165 Type 2 diabetes mellitus with hyperglycemia: Secondary | ICD-10-CM

## 2017-05-27 DIAGNOSIS — E785 Hyperlipidemia, unspecified: Secondary | ICD-10-CM | POA: Diagnosis not present

## 2017-05-27 DIAGNOSIS — I10 Essential (primary) hypertension: Secondary | ICD-10-CM

## 2017-05-27 MED ORDER — SE-TAN PLUS 162-115.2-1 MG PO CAPS: 1.0000 | ORAL_CAPSULE | Freq: Every day | ORAL | 3 refills | Status: DC

## 2017-05-27 NOTE — Progress Notes (Signed)
Subjective:    Patient ID: Pam Obrien, female    DOB: 11/10/1946, 71 y.o.   MRN: [id number]  HPI  Patient presents for yearly preventative medicine examination. She is a pleasant 71 year old female who  has a past medical history of Allergy; Arthritis; Diabetes mellitus without complication ([place]); Hyperlipidemia; and Hypertension.   All immunizations and health maintenance protocols were reviewed with the patient and needed orders were placed.  Appropriate screening laboratory values were ordered for the patient including screening of hyperlipidemia, renal function and hepatic function. If indicated by BPH, a PSA was ordered.  Medication reconciliation,  past medical history, social history, problem list and allergies were reviewed in detail with the patient  Goals were established with regard to weight loss, exercise, and  diet in compliance with medications. She is eating healthier but not exercising on a regular basis   End of life planning was discussed. He does not have an advanced directive and living will. She would like information.   She was seen by Endocrinology yesterday and has an improving lipid and A1c. Her A1c is down to 7.1 from 8.7. She has been working on diet and exercise. Endocrinology has her controlled with Metformin and Amaryl.   She takes 1/2 tab of Tenoretic for management of hypertension   She is up to date on her mammogram. She is due for her colonoscopy and is scheduled in September.   She has no acute complaints today   Review of Systems  Constitutional: Negative.   HENT: Negative.   Eyes: Negative.   Respiratory: Negative.   Cardiovascular: Negative.   Gastrointestinal: Negative.   Endocrine: Negative.   Genitourinary: Negative.   Musculoskeletal: Negative.   Allergic/Immunologic: Negative.   Neurological: Negative.   Hematological: Negative.   Psychiatric/Behavioral: Negative.   All other systems reviewed and are negative.  Past  Medical History:  Diagnosis Date  . Allergy   . Arthritis   . Diabetes mellitus without complication ([place])   . Hyperlipidemia   . Hypertension     Social History   Social History  . Marital status: Married    Spouse name: N/A  . Number of children: N/A  . Years of education: N/A   Occupational History  . Not on file.   Social History Main Topics  . Smoking status: Never Smoker  . Smokeless tobacco: Never Used  . Alcohol use No  . Drug use: No  . Sexual activity: Not on file   Other Topics Concern  . Not on file   Social History Narrative   Retired from [employer] work    Separated for 9 years.    Five children, two live locally, three are in [place]   One of her daughters live with her   Has a dog.    She likes to dance and walk    Past Surgical History:  Procedure Laterality Date  . ABDOMINAL HYSTERECTOMY  1994  . COLONOSCOPY  2008  . FLEXIBLE SIGMOIDOSCOPY  2003    Family History  Problem Relation Age of Onset  . Hypertension Sister   . Stroke Unknown   . Diabetes Father   . Hypertension Father   . Stroke Father   . Diabetes Sister   . Diabetes Brother   . Hypertension Brother   . Stroke Brother   . Heart disease Neg Hx     Allergies  Allergen Reactions  . Aspirin     REACTION: nervous  . Beef-Derived Products   .  Celecoxib     REACTION: joints swell  . Milk-Related Compounds   . Mold Extract [Trichophyton]   . Nsaids     REACTION: sweling  . Peanuts [Peanut Oil]   . Shellfish Allergy   . Sulfamethoxazole     REACTION: urticaria (hives)    Current Outpatient Prescriptions on File Prior to Visit  Medication Sig Dispense Refill  . ACCU-CHEK AVIVA PLUS test strip USE AS DIRECTED TO CHECK BLOOD SUGAR ONCE DAILY 50 each 3  . ACCU-CHEK SOFTCLIX LANCETS lancets USE AS DIRECTED EVERY DAY 100 each 1  . atenolol-chlorthalidone (TENORETIC) 50-25 MG tablet TAKE 1 TABLET BY MOUTH DAILY. 90 tablet 3  . calcium citrate-vitamin D (CITRACAL+D) 315-200  MG-UNIT per tablet Take 1 tablet by mouth daily.    . cholecalciferol (VITAMIN D) 1000 UNITS tablet Take 2,000 Units by mouth daily.    Marland Kitchen FeFum-FePo-FA-B Cmp-C-Zn-Mn-Cu (SE-TAN PLUS) 162-115.2-1 MG CAPS TAKE 1 CAPSULE BY MOUTH DAILY. 90 capsule 3  . glimepiride (AMARYL) 4 MG tablet TAKE 1 TABLET BY MOUTH EVERY DAY AT DINNER AND 1/2 TABLET AT BEDTIME 45 tablet 3  . loratadine (CLARITIN) 10 MG tablet Take 1 tablet (10 mg total) by mouth 2 (two) times daily as needed for allergies. 180 tablet 3  . metFORMIN (GLUCOPHAGE-XR) 500 MG 24 hr tablet TAKE 2 TABLETS EVERY DAY WITH SUPPER 60 tablet 3  . omeprazole (PRILOSEC) 20 MG capsule Take 20 mg by mouth daily.    Marland Kitchen spironolactone (ALDACTONE) 50 MG tablet TAKE 1 TABLET BY MOUTH ONCE DAILY 30 tablet 3   No current facility-administered medications on file prior to visit.     There were no vitals taken for this visit.      Objective:   Physical Exam  Constitutional: She is oriented to person, place, and time. She appears well-developed and well-nourished. No distress.  HENT:  Head: Normocephalic and atraumatic.  Right Ear: External ear normal.  Left Ear: External ear normal.  Nose: Nose normal.  Mouth/Throat: Oropharynx is clear and moist. She has dentures. Abnormal dentition. No oropharyngeal exudate.  Eyes: Pupils are equal, round, and reactive to light. Conjunctivae and EOM are normal. Right eye exhibits no discharge. Left eye exhibits no discharge. No scleral icterus.  Neck: Normal range of motion. Neck supple. No JVD present. No tracheal deviation present. No thyromegaly present.  Cardiovascular: Normal rate, regular rhythm, normal heart sounds and intact distal pulses.  Exam reveals no gallop and no friction rub.   No murmur heard. Pulmonary/Chest: Effort normal and breath sounds normal. No stridor. No respiratory distress. She has no wheezes. She has no rales. She exhibits no tenderness.  Abdominal: Soft. Bowel sounds are normal. She  exhibits no distension and no mass. There is no tenderness. There is no rebound and no guarding.  Genitourinary:  Genitourinary Comments: Refused   Musculoskeletal: Normal range of motion. She exhibits no edema, tenderness or deformity.  Lymphadenopathy:    She has no cervical adenopathy.  Neurological: She is alert and oriented to person, place, and time. She has normal reflexes. She displays normal reflexes. No cranial nerve deficit. She exhibits normal muscle tone. Coordination normal.  Skin: Skin is warm and dry. No rash noted. No erythema. No pallor.  Psychiatric: She has a normal mood and affect. Her behavior is normal. Judgment and thought content normal.  Nursing note and vitals reviewed.     Assessment & Plan:  1. Essential hypertension - Well controlled. No change in medication  - CBC with  Differential/Platelet - TSH  2. Uncontrolled type 2 diabetes mellitus with hyperglycemia, without long-term current use of insulin (HCC) - A1c has improved over the last 5 months. Continue to follow up with Dr. Dwyane Dee as directed - Work on diet and exercise - CBC with Differential/Platelet - TSH  3. Hyperlipidemia, unspecified hyperlipidemia type - Has improved. No need for statin at this time  - Will continue to monitor  - CBC with Differential/Platelet - TSH   [name], NP

## 2017-05-27 NOTE — Patient Instructions (Addendum)
It was great seeing you today   Great job on reducing your A1c and cholesterol. Keep working on diet and stay active   Follow up in one year or sooner if needed

## 2017-06-04 ENCOUNTER — Other Ambulatory Visit: Payer: Self-pay | Admitting: Endocrinology

## 2017-07-03 ENCOUNTER — Ambulatory Visit (AMBULATORY_SURGERY_CENTER): Payer: Self-pay | Admitting: *Deleted

## 2017-07-03 VITALS — Ht 65.0 in | Wt 152.2 lb

## 2017-07-03 DIAGNOSIS — Z86010 Personal history of colonic polyps: Secondary | ICD-10-CM

## 2017-07-03 MED ORDER — NA SULFATE-K SULFATE-MG SULF 17.5-3.13-1.6 GM/177ML PO SOLN: 1.0000 [IU] | Freq: Once | ORAL | 0 refills | Status: AC

## 2017-07-03 NOTE — Progress Notes (Signed)
No egg or soy allergy known to patient  No issues with past sedation with any surgeries  or procedures, no intubation problems  No diet pills per patient No home 02 use per patient  No blood thinners per patient  Pt denies issues with constipation  No A fib or A flutter  EMMI video sent to pt's e mail  No email address

## 2017-07-24 ENCOUNTER — Ambulatory Visit (AMBULATORY_SURGERY_CENTER): Payer: Medicare Other | Admitting: Gastroenterology

## 2017-07-24 ENCOUNTER — Encounter: Payer: Self-pay | Admitting: Gastroenterology

## 2017-07-24 VITALS — BP 134/73 | HR 69 | Temp 97.3°F | Resp 12 | Ht 65.0 in | Wt 152.0 lb

## 2017-07-24 DIAGNOSIS — Z86010 Personal history of colonic polyps: Secondary | ICD-10-CM | POA: Diagnosis not present

## 2017-07-24 DIAGNOSIS — Z1212 Encounter for screening for malignant neoplasm of rectum: Secondary | ICD-10-CM | POA: Diagnosis not present

## 2017-07-24 DIAGNOSIS — Z1211 Encounter for screening for malignant neoplasm of colon: Secondary | ICD-10-CM

## 2017-07-24 DIAGNOSIS — I10 Essential (primary) hypertension: Secondary | ICD-10-CM | POA: Diagnosis not present

## 2017-07-24 DIAGNOSIS — E119 Type 2 diabetes mellitus without complications: Secondary | ICD-10-CM | POA: Diagnosis not present

## 2017-07-24 MED ORDER — SODIUM CHLORIDE 0.9 % IV SOLN: 500.0000 mL | INTRAVENOUS | Status: DC

## 2017-07-24 NOTE — Progress Notes (Signed)
Report given to PACU, vss 

## 2017-07-24 NOTE — Op Note (Signed)
[facility] Patient Name: Pam Obrien Procedure Date: 07/24/2017 9:01 AM MRN: [id number] Endoscopist: Ladene Artist , MD Age: 71 Referring MD:  Date of Birth: 17-Feb-1946 Gender: Female Account #: [id number] Procedure:                Colonoscopy Indications:              Screening for colorectal malignant neoplasm Medicines:                Monitored Anesthesia Care Procedure:                Pre-Anesthesia Assessment:                           - Prior to the procedure, a History and Physical                            was performed, and patient medications and                            allergies were reviewed. The patient's tolerance of                            previous anesthesia was also reviewed. The risks                            and benefits of the procedure and the sedation                            options and risks were discussed with the patient.                            All questions were answered, and informed consent                            was obtained. Prior Anticoagulants: The patient has                            taken no previous anticoagulant or antiplatelet                            agents. ASA Grade Assessment: II - A patient with                            mild systemic disease. After reviewing the risks                            and benefits, the patient was deemed in                            satisfactory condition to undergo the procedure.                           After obtaining informed consent, the colonoscope  was passed under direct vision. Throughout the                            procedure, the patient's blood pressure, pulse, and                            oxygen saturations were monitored continuously. The                            Colonoscope was introduced through the anus and                            advanced to the the cecum, identified by                            appendiceal orifice and  ileocecal valve. The                            ileocecal valve, appendiceal orifice, and rectum                            were photographed. The quality of the bowel                            preparation was good. The colonoscopy was performed                            without difficulty. The patient tolerated the                            procedure well. Scope In: 9:30:26 AM Scope Out: 9:41:59 AM Scope Withdrawal Time: 0 hours 7 minutes 49 seconds  Total Procedure Duration: 0 hours 11 minutes 33 seconds  Findings:                 The perianal and digital rectal examinations were                            normal.                           A few small-mouthed diverticula were found in the                            transverse colon.                           Internal hemorrhoids were found during                            retroflexion. The hemorrhoids were small and Grade                            I (internal hemorrhoids that do not prolapse).  The exam was otherwise without abnormality on                            direct and retroflexion views. Complications:            No immediate complications. Estimated blood loss:                            None. Estimated Blood Loss:     Estimated blood loss: none. Impression:               - Diverticulosis in the transverse colon.                           - Internal hemorrhoids.                           - The examination was otherwise normal on direct                            and retroflexion views.                           - No specimens collected. Recommendation:           - Patient has a contact number available for                            emergencies. The signs and symptoms of potential                            delayed complications were discussed with the                            patient. Return to normal activities tomorrow.                            Written discharge instructions were  provided to the                            patient.                           - Resume previous diet.                           - Continue present medications.                           - No repeat colonoscopy due to age. Ladene Artist, MD 07/24/2017 9:55:16 AM This report has been signed electronically.

## 2017-07-24 NOTE — Patient Instructions (Addendum)
YOU HAD AN ENDOSCOPIC PROCEDURE TODAY AT [facility]:   Refer to the procedure report that was given to you for any specific questions about what was found during the examination.  If the procedure report does not answer your questions, please call your gastroenterologist to clarify.  If you requested that your care partner not be given the details of your procedure findings, then the procedure report has been included in a sealed envelope for you to review at your convenience later.  YOU SHOULD EXPECT: Some feelings of bloating in the abdomen. Passage of more gas than usual.  Walking can help get rid of the air that was put into your GI tract during the procedure and reduce the bloating. If you had a lower endoscopy (such as a colonoscopy or flexible sigmoidoscopy) you may notice spotting of blood in your stool or on the toilet paper. If you underwent a bowel prep for your procedure, you may not have a normal bowel movement for a few days.  Please Note:  You might notice some irritation and congestion in your nose or some drainage.  This is from the oxygen used during your procedure.  There is no need for concern and it should clear up in a day or so.  SYMPTOMS TO REPORT IMMEDIATELY:   Following lower endoscopy (colonoscopy or flexible sigmoidoscopy):  Excessive amounts of blood in the stool  Significant tenderness or worsening of abdominal pains  Swelling of the abdomen that is new, acute  Fever of 100F or higher   For urgent or emergent issues, a gastroenterologist can be reached at any hour by calling [phone].   DIET:  We do recommend a small meal at first, but then you may proceed to your regular diet.  Drink plenty of fluids but you should avoid alcoholic beverages for 24 hours.  ACTIVITY:  You should plan to take it easy for the rest of today and you should NOT DRIVE or use heavy machinery until tomorrow (because of the sedation medicines used during the test).     FOLLOW UP: Our staff will call the number listed on your records the next business day following your procedure to check on you and address any questions or concerns that you may have regarding the information given to you following your procedure. If we do not reach you, we will leave a message.  However, if you are feeling well and you are not experiencing any problems, there is no need to return our call.  We will assume that you have returned to your regular daily activities without incident.  If any biopsies were taken you will be contacted by phone or by letter within the next 1-3 weeks.  Please call us at [phone] if you have not heard about the biopsies in 3 weeks.    SIGNATURES/CONFIDENTIALITY: You and/or your care partner have signed paperwork which will be entered into your electronic medical record.  These signatures attest to the fact that that the information above on your After Visit Summary has been reviewed and is understood.  Full responsibility of the confidentiality of this discharge information lies with you and/or your care-partner.  You had a normal colonoscopy! Handouts given on Diverticulosis and Hemorrhoids.   Thank you for letting us take care of your healthcare needs today! :)

## 2017-07-24 NOTE — Progress Notes (Signed)
Pt's states no medical or surgical changes since previsit or office visit.  Maw  Reported to Dr. Fuller Plan and Lafe Garin, CRNA pt ate 4 cheddar cracker with peanut butter 07-23-17 at 0900 and a fried chicken leg 07-23-18 at 2100.  Pt reported she ate because her meds make her sick on an empty stomach.  Dr. Fuller Plan said will proceed with colonoscopy today.  I explained to pt if she is not cleaned out well, we will have to stop the exam.  Pt understands. maw

## 2017-07-25 ENCOUNTER — Telehealth: Payer: Self-pay | Admitting: *Deleted

## 2017-07-25 NOTE — Telephone Encounter (Signed)
  Follow up Call-  Call back number 07/24/2017  Post procedure Call Back phone  # [phone] cell  Permission to leave phone message Yes  Some recent data might be hidden     Patient questions:  Do you have a fever, pain , or abdominal swelling? No. Pain Score  0 *  Have you tolerated food without any problems? Yes.    Have you been able to return to your normal activities? Yes.    Do you have any questions about your discharge instructions: Diet   No. Medications  No. Follow up visit  No.  Do you have questions or concerns about your Care? No.  Actions: * If pain score is 4 or above: No action needed, pain <4.

## 2017-08-13 ENCOUNTER — Other Ambulatory Visit: Payer: Self-pay | Admitting: Endocrinology

## 2017-09-18 ENCOUNTER — Other Ambulatory Visit: Payer: Self-pay | Admitting: Endocrinology

## 2017-09-23 ENCOUNTER — Other Ambulatory Visit (INDEPENDENT_AMBULATORY_CARE_PROVIDER_SITE_OTHER): Payer: Medicare Other

## 2017-09-23 DIAGNOSIS — E1165 Type 2 diabetes mellitus with hyperglycemia: Secondary | ICD-10-CM

## 2017-09-23 LAB — COMPREHENSIVE METABOLIC PANEL
ALT: 12 U/L (ref 0–35)
AST: 15 U/L (ref 0–37)
Albumin: 4.6 g/dL (ref 3.5–5.2)
Alkaline Phosphatase: 91 U/L (ref 39–117)
BUN: 27 mg/dL — ABNORMAL HIGH (ref 6–23)
CO2: 28 mEq/L (ref 19–32)
Calcium: 9.9 mg/dL (ref 8.4–10.5)
Chloride: 98 mEq/L (ref 96–112)
Creatinine, Ser: 1.11 mg/dL (ref 0.40–1.20)
GFR: 61.99 mL/min (ref >60.00)
Glucose, Bld: 186 mg/dL — ABNORMAL HIGH (ref 70–99)
Potassium: 4.3 mEq/L (ref 3.5–5.1)
Sodium: 136 mEq/L (ref 135–145)
Total Bilirubin: 0.4 mg/dL (ref 0.2–1.2)
Total Protein: 8.4 g/dL — ABNORMAL HIGH (ref 6.0–8.3)

## 2017-09-23 LAB — HEMOGLOBIN A1C: Hgb A1c MFr Bld: 6.5 % (ref 4.6–6.5)

## 2017-09-26 ENCOUNTER — Encounter: Payer: Self-pay | Admitting: Endocrinology

## 2017-09-26 ENCOUNTER — Ambulatory Visit (INDEPENDENT_AMBULATORY_CARE_PROVIDER_SITE_OTHER): Payer: Medicare Other | Admitting: Endocrinology

## 2017-09-26 VITALS — BP 110/70 | HR 88 | Ht 65.0 in | Wt 154.2 lb

## 2017-09-26 DIAGNOSIS — E1165 Type 2 diabetes mellitus with hyperglycemia: Secondary | ICD-10-CM | POA: Diagnosis not present

## 2017-09-26 NOTE — Patient Instructions (Signed)
Check blood sugars on waking up  3/7 days  Also check blood sugars about 2 hours after a meal and do this after different meals by rotation  Recommended blood sugar levels on waking up is 90-130 and about 2 hours after meal is 130-160  Please bring your blood sugar monitor to each visit, thank you  Walk daily

## 2017-09-26 NOTE — Progress Notes (Signed)
j         Patient ID: Pam Obrien, female   DOB: [birth year], 71 y.o.   MRN: [id number]    Reason for Appointment: Followup for Type 2 Diabetes   History of Present Illness:          Diagnosis: Type 2 diabetes mellitus, date of diagnosis:  4/13      Past history:  She was initially started on metformin but he thinks that this made her sleepy and had some abdominal discomfort   She also has been tried on other oral hypoglycemic regimens including Jentadueto  since 2014  But she had similar side effects with this  And not clear how regularly she had been taking this until it was discontinued in 5/15  A1c appears to be persistently over 8% Since 2014  She was tried on Invokamet by her PCP but because of excessive urination with this and also some nausea and weakness it was stopped With glyburide alone her blood sugars are poorly controlled and she was referred here for further management She was given Januvia but she did not take this because of expense of the medication   She was referred to the nurse educator and started on basal insulin to improve her control on 06/07/14 This was changed to premixed insulin in 9/15 because of continued poor control and her not being able to afford NovoLog mix insulin or Invokana  She stopped taking insulin in 1/16 because of the cost and was taking only Amaryl.  Recent history:   Hypoglycemic drugs: Glimepiride 4 mg at supper, metformin ER 500 mg in pm    Although on her last visit her A1c was still high at 7.1 but has come down further at 6.5 now  Current management, blood sugar patterns and problems identified:  She has not brought her monitor again for download  She isn't consistent about what she thinks her blood sugars are and she does not think they are usually high in the morning although her lab fasting glucose 186  Based on her diet her blood sugars maybe occasionally over 200 also with again not clear if this is recent or on a  regular basis  No hypoglycemia during the day with taking 4 mg Amaryl at dinnertime  Recently has not done much walking although previously had been trying to do more  Overall her weight is about the same with slight increase  She is still trying to watch her portions and started watching her diet with controlling portions and healthier meals and less eating out  She is also trying to cut back on her fruits like grapes   Glucose monitoring with Accu-Chek meter:   Blood sugars by recall: am 129-140, highest pc 246, usually 160  Side effects from medications have been: Metformin higher doses causes abdominal discomfort Compliance with the medical regimen: fair  Self-care: The diet that the patient has been following is: tries to limit  portions      Meals:  usually 2-3 meals per day. Usually has variable intake in the morning, sometimes toast/fruit, usually has carbohydrates at dinner time, will have fruit for snacks           Exercise: walking 0-1 days a week, upto 1 mile.        Dietician visit: Most recent: 03/2016 .              CDE visit last in 7/15  Wt Readings from Last 3 Encounters:  09/26/17 154  lb 3.2 oz (69.9 kg)  07/24/17 152 lb (68.9 kg)  07/03/17 152 lb 3.2 oz (69 kg)       Glycemic control:   Lab Results  Component Value Date   HGBA1C 6.5 09/23/2017   HGBA1C 7.1 (H) 05/21/2017   HGBA1C 8.7 (H) 12/05/2016   Lab Results  Component Value Date   MICROALBUR 2.3 (H) 01/22/2017   LDLCALC 106 (H) 05/21/2017   CREATININE 1.11 09/23/2017    Lab on 09/23/2017  Component Date Value Ref Range Status  . Sodium 09/23/2017 136  135 - 145 mEq/L Final  . Potassium 09/23/2017 4.3  3.5 - 5.1 mEq/L Final  . Chloride 09/23/2017 98  96 - 112 mEq/L Final  . CO2 09/23/2017 28  19 - 32 mEq/L Final  . Glucose, Bld 09/23/2017 186* 70 - 99 mg/dL Final  . BUN 09/23/2017 27* 6 - 23 mg/dL Final  . Creatinine, Ser 09/23/2017 1.11  0.40 - 1.20 mg/dL Final  . Total Bilirubin  09/23/2017 0.4  0.2 - 1.2 mg/dL Final  . Alkaline Phosphatase 09/23/2017 91  39 - 117 U/L Final  . AST 09/23/2017 15  0 - 37 U/L Final  . ALT 09/23/2017 12  0 - 35 U/L Final  . Total Protein 09/23/2017 8.4* 6.0 - 8.3 g/dL Final  . Albumin 09/23/2017 4.6  3.5 - 5.2 g/dL Final  . Calcium 09/23/2017 9.9  8.4 - 10.5 mg/dL Final  . GFR 09/23/2017 61.99  >60.00 mL/min Final  . Hgb A1c MFr Bld 09/23/2017 6.5  4.6 - 6.5 % Final   Glycemic Control Guidelines for People with Diabetes:Non Diabetic:  <6%Goal of Therapy: <7%Additional Action Suggested:  >8%      Weight history:  Previous range upto 172   Wt Readings from Last 3 Encounters:  09/26/17 154 lb 3.2 oz (69.9 kg)  07/24/17 152 lb (68.9 kg)  07/03/17 152 lb 3.2 oz (69 kg)    Allergies as of 09/26/2017      Reactions   Aspirin    REACTION: nervous   Beef-derived Products    Sneezing, nasal drainage, throat scratchy   Celecoxib    REACTION: joints swell   Milk-related Compounds    itching   Mold Extract [trichophyton]    Pt does not know the reaction.  Positive allergy test per pt.   Nsaids    REACTION: sweling   Peanuts [peanut Oil]    Throat feels scratchy   Shellfish Allergy    Nasal congestion, throat scratchy, eyes watery   Sulfamethoxazole    REACTION: urticaria (hives)   Vioxx [rofecoxib] Swelling      Medication List        Accurate as of 09/26/17  1:11 PM. Always use your most recent med list.          ACCU-CHEK AVIVA PLUS test strip Generic drug:  glucose blood USE AS DIRECTED TO CHECK BLOOD SUGAR ONCE DAILY   ACCU-CHEK SOFTCLIX LANCETS lancets USE AS DIRECTED EVERY DAY   atenolol-chlorthalidone 50-25 MG tablet Commonly known as:  TENORETIC TAKE 1 TABLET BY MOUTH DAILY.   calcium citrate-vitamin D 315-200 MG-UNIT tablet Commonly known as:  CITRACAL+D Take 1 tablet by mouth daily.   cholecalciferol 1000 units tablet Commonly known as:  VITAMIN D Take 2,000 Units by mouth daily.   glimepiride 4  MG tablet Commonly known as:  AMARYL TAKE 1 TABLET EVERY DAY AT DINNER AND 1/2 TABLET AT BEDTIME   loratadine 10 MG tablet Commonly known  as:  CLARITIN Take 1 tablet (10 mg total) by mouth 2 (two) times daily as needed for allergies.   metFORMIN 500 MG 24 hr tablet Commonly known as:  GLUCOPHAGE-XR TAKE 2 TABLETS EVERY DAY WITH SUPPER   omeprazole 20 MG capsule Commonly known as:  PRILOSEC Take 20 mg by mouth daily.   SE-TAN PLUS 162-115.2-1 MG Caps Take 1 capsule by mouth daily.   spironolactone 50 MG tablet Commonly known as:  ALDACTONE TAKE 1 TABLET BY MOUTH ONCE DAILY       Allergies:  Allergies  Allergen Reactions  . Aspirin     REACTION: nervous  . Beef-Derived Products     Sneezing, nasal drainage, throat scratchy  . Celecoxib     REACTION: joints swell  . Milk-Related Compounds     itching  . Mold Extract [Trichophyton]     Pt does not know the reaction.  Positive allergy test per pt.  . Nsaids     REACTION: sweling  . Peanuts [Peanut Oil]     Throat feels scratchy  . Shellfish Allergy     Nasal congestion, throat scratchy, eyes watery  . Sulfamethoxazole     REACTION: urticaria (hives)  . Vioxx [Rofecoxib] Swelling    Past Medical History:  Diagnosis Date  . Allergy   . Anemia    as a young adult  . Arthritis   . Diabetes mellitus without complication ([place])   . Hyperlipidemia    was normal range 05/2017 with PCP  . Hypertension     Past Surgical History:  Procedure Laterality Date  . ABDOMINAL HYSTERECTOMY  1994  . COLONOSCOPY  2008  . FLEXIBLE SIGMOIDOSCOPY  2003  . POLYPECTOMY      Family History  Problem Relation Age of Onset  . Hypertension Sister   . Stroke Unknown   . Diabetes Father   . Hypertension Father   . Stroke Father   . Diabetes Sister   . Diabetes Brother   . Hypertension Brother   . Stroke Brother   . Esophageal cancer Neg Hx   . Rectal cancer Neg Hx   . Stomach cancer Neg Hx   . Colon cancer Neg Hx      Social History:  reports that  has never smoked. she has never used smokeless tobacco. She reports that she does not drink alcohol or use drugs.    Review of Systems   Retinal exam: Most recent: 3/18  HYPERTENSION: She takes a half a tablet of atenolol/chlorthalidone  HYPOKALEMIA: Her potassium had been low consistently prior to starting Aldactone This is  controlled with taking Aldactone 50 mg   Lab Results  Component Value Date   CREATININE 1.11 09/23/2017   BUN 27 (H) 09/23/2017   NA 136 09/23/2017   K 4.3 09/23/2017   CL 98 09/23/2017   CO2 28 09/23/2017    Lipids: She was told to start Pravastatin but does not take this because of cost However her LDL appears to be only minimally higher She does have family history of CVA but no known history of CAD       Lab Results  Component Value Date   CHOL 175 05/21/2017   HDL 55.50 05/21/2017   LDLCALC 106 (H) 05/21/2017   LDLDIRECT 130.0 05/17/2016   TRIG 69.0 05/21/2017   CHOLHDL 3 05/21/2017    Last foot exam was in 01/2017, also had no symptoms of neuropathy   Physical Examination:  BP 110/70   Pulse 88  Ht 5\' 5"  (1.651 m)   Wt 154 lb 3.2 oz (69.9 kg)   SpO2 96%   BMI 25.66 kg/m   No ankle edema present      ASSESSMENT:  Diabetes type 2   See history of present illness for detailed discussion of  current management, blood sugar patterns and problems identified  Although her A1c was 8.7 earlier this year her  control has progressively improved and A1c is now 6.5 Despite her fasting readings probably being higher at times including in the lab which was 186 She does need to check more readings consistently at various times especially after supper at night  Hypertension: Blood pressure is controlled and she is benefiting from Aldactone also her hypokalemia   LIPIDS: LDL will be checked on her next visit  PLAN:    She will continue Amaryl and metformin   To check blood sugars regularly and call  if fasting blood sugars consistently high  Also discussed needing to periodically monitor after meals to help adjust her diet  She needs to bring her monitor on each visit   Balanced meals with some protein at each meal and watch simple sugars consistently  A1c and follow-up in another 4 months    There are no Patient Instructions on file for this visit.       Myquan Schaumburg 09/26/2017, 1:11 PM   Note: This office note was prepared with Dragon voice recognition system technology. Any transcriptional errors that result from this process are unintentional.

## 2017-10-26 ENCOUNTER — Other Ambulatory Visit: Payer: Self-pay | Admitting: Endocrinology

## 2017-12-20 ENCOUNTER — Other Ambulatory Visit: Payer: Self-pay | Admitting: Endocrinology

## 2018-01-18 ENCOUNTER — Other Ambulatory Visit: Payer: Self-pay | Admitting: Endocrinology

## 2018-01-23 ENCOUNTER — Other Ambulatory Visit: Payer: Medicare Other

## 2018-01-26 ENCOUNTER — Ambulatory Visit: Payer: Medicare Other | Admitting: Endocrinology

## 2018-02-20 ENCOUNTER — Other Ambulatory Visit (INDEPENDENT_AMBULATORY_CARE_PROVIDER_SITE_OTHER): Payer: Medicare Other

## 2018-02-20 DIAGNOSIS — E1165 Type 2 diabetes mellitus with hyperglycemia: Secondary | ICD-10-CM

## 2018-02-20 LAB — COMPREHENSIVE METABOLIC PANEL WITH GFR
ALT: 12 U/L (ref 0–35)
AST: 17 U/L (ref 0–37)
Albumin: 4.6 g/dL (ref 3.5–5.2)
Alkaline Phosphatase: 86 U/L (ref 39–117)
BUN: 28 mg/dL — ABNORMAL HIGH (ref 6–23)
CO2: 28 meq/L (ref 19–32)
Calcium: 10 mg/dL (ref 8.4–10.5)
Chloride: 101 meq/L (ref 96–112)
Creatinine, Ser: 1.19 mg/dL (ref 0.40–1.20)
GFR: 57.14 mL/min — ABNORMAL LOW
Glucose, Bld: 170 mg/dL — ABNORMAL HIGH (ref 70–99)
Potassium: 4.7 meq/L (ref 3.5–5.1)
Sodium: 140 meq/L (ref 135–145)
Total Bilirubin: 0.5 mg/dL (ref 0.2–1.2)
Total Protein: 8.2 g/dL (ref 6.0–8.3)

## 2018-02-20 LAB — LIPID PANEL
Cholesterol: 205 mg/dL — ABNORMAL HIGH (ref 0–200)
HDL: 53 mg/dL (ref >39.00)
LDL Cholesterol: 138 mg/dL — ABNORMAL HIGH (ref 0–99)
NonHDL: 152.39
Total CHOL/HDL Ratio: 4
Triglycerides: 74 mg/dL (ref 0.0–149.0)
VLDL: 14.8 mg/dL (ref 0.0–40.0)

## 2018-02-20 LAB — HEMOGLOBIN A1C: Hgb A1c MFr Bld: 7 % — ABNORMAL HIGH (ref 4.6–6.5)

## 2018-02-20 LAB — MICROALBUMIN / CREATININE URINE RATIO
Creatinine,U: 454.4 mg/dL
Microalb Creat Ratio: 0.6 mg/g (ref 0.0–30.0)
Microalb, Ur: 2.6 mg/dL — ABNORMAL HIGH (ref 0.0–1.9)

## 2018-02-25 ENCOUNTER — Ambulatory Visit (INDEPENDENT_AMBULATORY_CARE_PROVIDER_SITE_OTHER): Payer: Medicare Other | Admitting: Endocrinology

## 2018-02-25 ENCOUNTER — Encounter: Payer: Self-pay | Admitting: Endocrinology

## 2018-02-25 VITALS — BP 126/86 | HR 70 | Ht 65.0 in | Wt 161.8 lb

## 2018-02-25 DIAGNOSIS — E1165 Type 2 diabetes mellitus with hyperglycemia: Secondary | ICD-10-CM

## 2018-02-25 DIAGNOSIS — E7800 Pure hypercholesterolemia, unspecified: Secondary | ICD-10-CM

## 2018-02-25 MED ORDER — SIMVASTATIN 20 MG PO TABS: 20.0000 mg | ORAL_TABLET | Freq: Every day | ORAL | 3 refills | Status: DC

## 2018-02-25 NOTE — Progress Notes (Signed)
j         Patient ID: ADASIA HOAR, female   DOB: [birth year], 72 y.o.   MRN: [id number]    Reason for Appointment: Followup for Type 2 Diabetes   History of Present Illness:          Diagnosis: Type 2 diabetes mellitus, date of diagnosis:  4/13      Past history:  She was initially started on metformin but he thinks that this made her sleepy and had some abdominal discomfort   She also has been tried on other oral hypoglycemic regimens including Jentadueto  since 2014  But she had similar side effects with this  And not clear how regularly she had been taking this until it was discontinued in 5/15  A1c appears to be persistently over 8% Since 2014  She was tried on Invokamet by her PCP but because of excessive urination with this and also some nausea and weakness it was stopped With glyburide alone her blood sugars are poorly controlled and she was referred here for further management She was given Januvia but she did not take this because of expense of the medication   She was referred to the nurse educator and started on basal insulin to improve her control on 06/07/14 This was changed to premixed insulin in 9/15 because of continued poor control and her not being able to afford NovoLog mix insulin or Invokana  She stopped taking insulin in 1/16 because of the cost and was taking only Amaryl.  Recent history:   Hypoglycemic drugs: Glimepiride 4 mg at supper, metformin ER 500 mg in pm    Although on her last visit her A1c was 8.5 it is now up to 7%   Current management, blood sugar patterns and problems identified:  She has checked her blood sugar mostly fasting although difficult to interpret her download since her monitor is probably off by 12 hours  She has gained weight  She thinks that the new generic she got for metformin makes her eat more and not watching her diet as well  Also sometimes eating large amounts of fruits  Has been irregular with walking  also   Glucose monitoring with Accu-Chek meter:   Blood sugars up checked mostly fasting ranging from 116 up to 168 with average 137 Meter has probably been off by 12 hours on the time  Side effects from medications have been: Metformin higher doses causes abdominal discomfort Compliance with the medical regimen: fair  Self-care: The diet that the patient has been following is: tries to limit  portions      Meals:  usually 2-3 meals per day. Usually has variable intake in the morning, sometimes toast/fruit, usually has carbohydrates at dinner time, will have fruit for snacks           Exercise: walking 0-1 days a week, upto 1 mile.        Dietician visit: Most recent: 03/2016 .              CDE visit last in 7/15  Wt Readings from Last 3 Encounters:  02/25/18 161 lb 12.8 oz (73.4 kg)  09/26/17 154 lb 3.2 oz (69.9 kg)  07/24/17 152 lb (68.9 kg)       Glycemic control:   Lab Results  Component Value Date   HGBA1C 7.0 (H) 02/20/2018   HGBA1C 6.5 09/23/2017   HGBA1C 7.1 (H) 05/21/2017   Lab Results  Component Value Date   MICROALBUR 2.6 (H)  02/20/2018   LDLCALC 138 (H) 02/20/2018   CREATININE 1.19 02/20/2018    Lab on 02/20/2018  Component Date Value Ref Range Status  . Cholesterol 02/20/2018 205* 0 - 200 mg/dL Final   ATP III Classification       Desirable:  < 200 mg/dL               Borderline High:  200 - 239 mg/dL          High:  > = 240 mg/dL  . Triglycerides 02/20/2018 74.0  0.0 - 149.0 mg/dL Final   Normal:  <150 mg/dLBorderline High:  150 - 199 mg/dL  . HDL 02/20/2018 53.00  >39.00 mg/dL Final  . VLDL 02/20/2018 14.8  0.0 - 40.0 mg/dL Final  . LDL Cholesterol 02/20/2018 138* 0 - 99 mg/dL Final  . Total CHOL/HDL Ratio 02/20/2018 4   Final                  Men          Women1/2 Average Risk     3.4          3.3Average Risk          5.0          4.42X Average Risk          9.6          7.13X Average Risk          15.0          11.0                      . NonHDL  02/20/2018 152.39   Final   NOTE:  Non-HDL goal should be 30 mg/dL higher than patient's LDL goal (i.e. LDL goal of < 70 mg/dL, would have non-HDL goal of < 100 mg/dL)  . Microalb, Ur 02/20/2018 2.6* 0.0 - 1.9 mg/dL Final  . Creatinine,U 02/20/2018 454.4  mg/dL Final  . Microalb Creat Ratio 02/20/2018 0.6  0.0 - 30.0 mg/g Final  . Sodium 02/20/2018 140  135 - 145 mEq/L Final  . Potassium 02/20/2018 4.7  3.5 - 5.1 mEq/L Final  . Chloride 02/20/2018 101  96 - 112 mEq/L Final  . CO2 02/20/2018 28  19 - 32 mEq/L Final  . Glucose, Bld 02/20/2018 170* 70 - 99 mg/dL Final  . BUN 02/20/2018 28* 6 - 23 mg/dL Final  . Creatinine, Ser 02/20/2018 1.19  0.40 - 1.20 mg/dL Final  . Total Bilirubin 02/20/2018 0.5  0.2 - 1.2 mg/dL Final  . Alkaline Phosphatase 02/20/2018 86  39 - 117 U/L Final  . AST 02/20/2018 17  0 - 37 U/L Final  . ALT 02/20/2018 12  0 - 35 U/L Final  . Total Protein 02/20/2018 8.2  6.0 - 8.3 g/dL Final  . Albumin 02/20/2018 4.6  3.5 - 5.2 g/dL Final  . Calcium 02/20/2018 10.0  8.4 - 10.5 mg/dL Final  . GFR 02/20/2018 57.14* >60.00 mL/min Final  . Hgb A1c MFr Bld 02/20/2018 7.0* 4.6 - 6.5 % Final   Glycemic Control Guidelines for People with Diabetes:Non Diabetic:  <6%Goal of Therapy: <7%Additional Action Suggested:  >8%      Weight history:  Previous range upto 172   Wt Readings from Last 3 Encounters:  02/25/18 161 lb 12.8 oz (73.4 kg)  09/26/17 154 lb 3.2 oz (69.9 kg)  07/24/17 152 lb (68.9 kg)    Allergies as of 02/25/2018  Reactions   Aspirin    REACTION: nervous   Beef-derived Products    Sneezing, nasal drainage, throat scratchy   Celecoxib    REACTION: joints swell   Milk-related Compounds    itching   Mold Extract [trichophyton]    Pt does not know the reaction.  Positive allergy test per pt.   Nsaids    REACTION: sweling   Peanuts [peanut Oil]    Throat feels scratchy   Shellfish Allergy    Nasal congestion, throat scratchy, eyes watery    Sulfamethoxazole    REACTION: urticaria (hives)   Vioxx [rofecoxib] Swelling      Medication List        Accurate as of 02/25/18  9:12 AM. Always use your most recent med list.          ACCU-CHEK AVIVA PLUS test strip Generic drug:  glucose blood USE AS DIRECTED TO CHECK BLOOD SUGAR ONCE DAILY   ACCU-CHEK SOFTCLIX LANCETS lancets USE AS DIRECTED EVERY DAY   atenolol-chlorthalidone 50-25 MG tablet Commonly known as:  TENORETIC TAKE 1 TABLET BY MOUTH DAILY.   calcium citrate-vitamin D 315-200 MG-UNIT tablet Commonly known as:  CITRACAL+D Take 1 tablet by mouth daily.   cholecalciferol 1000 units tablet Commonly known as:  VITAMIN D Take 2,000 Units by mouth daily.   glimepiride 4 MG tablet Commonly known as:  AMARYL TAKE 1 TABLET EVERY DAY AT DINNER AND 1/2 TABLET AT BEDTIME   loratadine 10 MG tablet Commonly known as:  CLARITIN Take 1 tablet (10 mg total) by mouth 2 (two) times daily as needed for allergies.   metFORMIN 500 MG 24 hr tablet Commonly known as:  GLUCOPHAGE-XR TAKE 2 TABLETS EVERY DAY WITH SUPPER   omeprazole 20 MG capsule Commonly known as:  PRILOSEC Take 20 mg by mouth daily.   SE-TAN PLUS 162-115.2-1 MG Caps Take 1 capsule by mouth daily.   spironolactone 50 MG tablet Commonly known as:  ALDACTONE TAKE 1 TABLET BY MOUTH EVERY DAY       Allergies:  Allergies  Allergen Reactions  . Aspirin     REACTION: nervous  . Beef-Derived Products     Sneezing, nasal drainage, throat scratchy  . Celecoxib     REACTION: joints swell  . Milk-Related Compounds     itching  . Mold Extract [Trichophyton]     Pt does not know the reaction.  Positive allergy test per pt.  . Nsaids     REACTION: sweling  . Peanuts [Peanut Oil]     Throat feels scratchy  . Shellfish Allergy     Nasal congestion, throat scratchy, eyes watery  . Sulfamethoxazole     REACTION: urticaria (hives)  . Vioxx [Rofecoxib] Swelling    Past Medical History:  Diagnosis  Date  . Allergy   . Anemia    as a young adult  . Arthritis   . Diabetes mellitus without complication ([place])   . Hyperlipidemia    was normal range 05/2017 with PCP  . Hypertension     Past Surgical History:  Procedure Laterality Date  . ABDOMINAL HYSTERECTOMY  1994  . COLONOSCOPY  2008  . FLEXIBLE SIGMOIDOSCOPY  2003  . POLYPECTOMY      Family History  Problem Relation Age of Onset  . Hypertension Sister   . Stroke Unknown   . Diabetes Father   . Hypertension Father   . Stroke Father   . Diabetes Sister   . Diabetes Brother   .  Hypertension Brother   . Stroke Brother   . Esophageal cancer Neg Hx   . Rectal cancer Neg Hx   . Stomach cancer Neg Hx   . Colon cancer Neg Hx     Social History:  reports that she has never smoked. She has never used smokeless tobacco. She reports that she does not drink alcohol or use drugs.    Review of Systems   Retinal exam: Most recent: 3/18  HYPERTENSION: She takes a half a tablet of atenolol/chlorthalidone and followed by PCP  HYPOKALEMIA: Idiopathic and treated with Aldactone This is  controlled with taking Aldactone 50 mg   Lab Results  Component Value Date   CREATININE 1.19 02/20/2018   BUN 28 (H) 02/20/2018   NA 140 02/20/2018   K 4.7 02/20/2018   CL 101 02/20/2018   CO2 28 02/20/2018    Lipids: She does not take pravastatin as prescribed because of cost LDL is significantly high  She does have family history of CVA but no known history of CAD       Lab Results  Component Value Date   CHOL 205 (H) 02/20/2018   HDL 53.00 02/20/2018   LDLCALC 138 (H) 02/20/2018   LDLDIRECT 130.0 05/17/2016   TRIG 74.0 02/20/2018   CHOLHDL 4 02/20/2018    Last foot exam was in 01/2017, also had no symptoms of neuropathy   Physical Examination:  BP 126/86 (BP Location: Left Arm, Patient Position: Sitting, Cuff Size: Normal)   Pulse 70   Ht 5\' 5"  (1.651 m)   Wt 161 lb 12.8 oz (73.4 kg)   SpO2 95%   BMI 26.92 kg/m    No ankle edema present      ASSESSMENT:  Diabetes type 2 with BMI now 27  See history of present illness for detailed discussion of  current management, blood sugar patterns and problems identified  Has had a relatively high A1c This is likely to be from poor diet and weight gain Checking her blood sugars mostly fasting and these are variable her lab glucose 170 fasting She has questions about her diet and appears to be getting unbalanced meals and large amounts of fruits at times   Hypertension: Blood pressure is high normal, will continue to monitor and she will follow-up with PCP also  LIPIDS: She agrees to try Zocor 20 mg daily, if this is more expensive at [facility] she will [facility] to [facility] Discussed cardiovascular protection with using this  LDL will be checked on her next visit  PLAN:    She will see the dietitian  She will try to increase her Metformin to twice a day and see if she can tolerate this  Cut back on large amounts of fruits  Regular walking for exercise  More readings after meals    There are no Patient Instructions on file for this visit.       Elayne Snare 02/25/2018, 9:12 AM   Note: This office note was prepared with Dragon voice recognition system technology. Any transcriptional errors that result from this process are unintentional.

## 2018-02-25 NOTE — Patient Instructions (Addendum)
Check blood sugars on waking up  2-3/7  Also check blood sugars about 2 hours after a meal and do this after different meals by rotation  Recommended blood sugar levels on waking up is 90-130 and about 2 hours after meal is 130-160  Please bring your blood sugar monitor to each visit, thank you  Try metformin 2x daily  Walk daily  Small amounts of fruits

## 2018-02-26 ENCOUNTER — Other Ambulatory Visit: Payer: Self-pay | Admitting: Endocrinology

## 2018-03-17 ENCOUNTER — Other Ambulatory Visit: Payer: Self-pay | Admitting: Endocrinology

## 2018-03-20 ENCOUNTER — Other Ambulatory Visit: Payer: Self-pay | Admitting: Family Medicine

## 2018-03-20 DIAGNOSIS — I10 Essential (primary) hypertension: Secondary | ICD-10-CM

## 2018-03-24 ENCOUNTER — Other Ambulatory Visit: Payer: Self-pay

## 2018-03-24 DIAGNOSIS — I10 Essential (primary) hypertension: Secondary | ICD-10-CM

## 2018-03-24 MED ORDER — ATENOLOL-CHLORTHALIDONE 50-25 MG PO TABS: 1.0000 | ORAL_TABLET | Freq: Every day | ORAL | 3 refills | Status: DC

## 2018-03-26 ENCOUNTER — Ambulatory Visit: Payer: Medicare Other | Admitting: Dietician

## 2018-04-24 ENCOUNTER — Encounter: Payer: Medicare Other | Attending: Endocrinology | Admitting: Dietician

## 2018-04-24 ENCOUNTER — Encounter: Payer: Self-pay | Admitting: Dietician

## 2018-04-24 DIAGNOSIS — E118 Type 2 diabetes mellitus with unspecified complications: Secondary | ICD-10-CM

## 2018-04-24 DIAGNOSIS — Z6825 Body mass index (BMI) 25.0-25.9, adult: Secondary | ICD-10-CM | POA: Diagnosis not present

## 2018-04-24 DIAGNOSIS — E1165 Type 2 diabetes mellitus with hyperglycemia: Secondary | ICD-10-CM | POA: Diagnosis not present

## 2018-04-24 DIAGNOSIS — Z713 Dietary counseling and surveillance: Secondary | ICD-10-CM | POA: Diagnosis not present

## 2018-04-24 NOTE — Progress Notes (Signed)
Diabetes Self-Management Education  Visit Type: First/Initial  Appt. Start Time: 1030 Appt. End Time: 1130  04/24/2018  [name], identified by name and date of birth, is a 72 y.o. female with a diagnosis of Diabetes: Type 2. Other history includes HTN and hyperlipidemia.   She has multiple allergies including food:  Beef, [place], milk, peanuts, tree nuts, shellfish, wheat, legumes.  She does eat small amounts of these occasionally and notices some throat tightness. Medications include glimepiride and metformin XR.   Labs 02/20/18:  Cholesterol 205, HDL 53, LDL 138, Triglycerides 74, GFR 57 decreased.   Weight similar to her last visit with me 2 years ago.  Patient's daughter lives with her.  Patient and daughter shop and patient cooks her own food.  She tries to limit sweets but varies.  She is tired of eating what she can/should eat.  Appetite is fine per patient.  Intake is imbalanced and often lacks protein.  She skips meals at times.  She is retired from a company that [employer] f[employer].  ASSESSMENT  Height 5\' 6"  (1.676 m), weight 159 lb (72.1 kg). Body mass index is 25.66 kg/m.  Diabetes Self-Management Education - 04/24/18 1051      Visit Information   Visit Type  First/Initial      Initial Visit   Diabetes Type  Type 2    Are you currently following a meal plan?  No    Are you taking your medications as prescribed?  Yes    Date Diagnosed  02/2012      Health Coping   How would you rate your overall health?  Good      Psychosocial Assessment   Patient Belief/Attitude about Diabetes  Motivated to manage diabetes    Self-care barriers  None    Self-management support  Doctor's office    Other persons present  Patient    Patient Concerns  Nutrition/Meal planning;Support;Glycemic Control;Weight Control    Special Needs  None    Preferred Learning Style  No preference indicated    Learning Readiness  Ready    How often do you need to have someone help you  when you read instructions, pamphlets, or other written materials from your doctor or pharmacy?  1 - Never    What is the last grade level you completed in school?  2 years college      Pre-Education Assessment   Patient understands the diabetes disease and treatment process.  Needs Review    Patient understands incorporating nutritional management into lifestyle.  Needs Review    Patient undertands incorporating physical activity into lifestyle.  Needs Review    Patient understands using medications safely.  Needs Review    Patient understands monitoring blood glucose, interpreting and using results  Needs Review    Patient understands prevention, detection, and treatment of acute complications.  Needs Review    Patient understands prevention, detection, and treatment of chronic complications.  Needs Review    Patient understands how to develop strategies to address psychosocial issues.  Needs Review    Patient understands how to develop strategies to promote health/change behavior.  Needs Review      Complications   Last HgB A1C per patient/outside source  7 % 02/20/18    How often do you check your blood sugar?  3-4 times / week    Fasting Blood glucose range (mg/dL)  130-179    Postprandial Blood glucose range (mg/dL)  130-179;70-129    Number of hypoglycemic  episodes per month  0    Number of hyperglycemic episodes per week  0    Have you had a dilated eye exam in the past 12 months?  Yes    Have you had a dental exam in the past 12 months?  No    Are you checking your feet?  Yes    How many days per week are you checking your feet?  4      Dietary Intake   Breakfast  skips at at times as she sleeps late OR occasional french toast with maple syrup    Snack (morning)  occasional banana    Lunch  fruit, cheese    Dinner  baked pork chop or chicken or fish, sweet potate or rice, vegetable OR spaghetti with marinara sauce 6    Snack (evening)  fruit    Beverage(s)  water, occasional  regular gingerale or sprite      Exercise   Exercise Type  Light (walking / raking leaves)    How many days per week to you exercise?  7    How many minutes per day do you exercise?  15    Total minutes per week of exercise  105      Patient Education   Previous Diabetes Education  Yes (please comment) 2 years ago with this RD    Nutrition management   Meal timing in regards to the patients' current diabetes medication.;Information on hints to eating out and maintain blood glucose control.;Meal options for control of blood glucose level and chronic complications.    Physical activity and exercise   Role of exercise on diabetes management, blood pressure control and cardiac health.    Medications  Reviewed patients medication for diabetes, action, purpose, timing of dose and side effects.      Individualized Goals (developed by patient)   Nutrition  General guidelines for healthy choices and portions discussed    Physical Activity  Exercise 5-7 days per week;30 minutes per day    Medications  take my medication as prescribed    Monitoring   test my blood glucose as discussed    Reducing Risk  examine blood glucose patterns    Health Coping  discuss diabetes with (comment) MD, RD, CDE      Post-Education Assessment   Patient understands the diabetes disease and treatment process.  Demonstrates understanding / competency    Patient understands incorporating nutritional management into lifestyle.  Demonstrates understanding / competency    Patient undertands incorporating physical activity into lifestyle.  Demonstrates understanding / competency    Patient understands using medications safely.  Demonstrates understanding / competency    Patient understands monitoring blood glucose, interpreting and using results  Demonstrates understanding / competency    Patient understands prevention, detection, and treatment of acute complications.  Demonstrates understanding / competency    Patient  understands prevention, detection, and treatment of chronic complications.  Demonstrates understanding / competency    Patient understands how to develop strategies to address psychosocial issues.  Demonstrates understanding / competency    Patient understands how to develop strategies to promote health/change behavior.  Needs Review      Outcomes   Expected Outcomes  Demonstrated interest in learning. Expect positive outcomes    Future DMSE  Yearly    Program Status  Completed       Individualized Plan for Diabetes Self-Management Training:   Learning Objective:  Patient will have a greater understanding of diabetes self-management. Patient education  plan is to attend individual and/or group sessions per assessed needs and concerns.   Plan:   Patient Instructions  Be active every day for 30 minutes. Breakfast, lunch, and dinner daily. Small amounts of Protein with each meal Aim for 1/2 of your plate to be vegetables. (brussel sprouts etc). Consider Glucerna once daily (Can blend this with 1 cup frozen strawberries).   [facility]  [address]  Saturday 7-12 (year round)  Wednesday 8-12 (April 17-October 30)   Expected Outcomes:  Demonstrated interest in learning. Expect positive outcomes  Education material provided: Meal plan card and My Plate  If problems or questions, patient to contact team via:  Phone  Future DSME appointment: Yearly

## 2018-04-24 NOTE — Patient Instructions (Addendum)
Be active every day for 30 minutes. Breakfast, lunch, and dinner daily. Small amounts of Protein with each meal Aim for 1/2 of your plate to be vegetables. (brussel sprouts etc). Consider Glucerna once daily (Can blend this with 1 cup frozen strawberries).   [facility]  [address]  Saturday 7-12 (year round)  Wednesday 8-12 (April 17-October 30)

## 2018-05-16 ENCOUNTER — Other Ambulatory Visit: Payer: Self-pay | Admitting: Endocrinology

## 2018-05-18 ENCOUNTER — Other Ambulatory Visit: Payer: Self-pay | Admitting: Adult Health

## 2018-05-18 DIAGNOSIS — Z1231 Encounter for screening mammogram for malignant neoplasm of breast: Secondary | ICD-10-CM

## 2018-05-22 ENCOUNTER — Other Ambulatory Visit: Payer: Medicare Other

## 2018-05-25 ENCOUNTER — Other Ambulatory Visit: Payer: Medicare Other

## 2018-05-27 ENCOUNTER — Encounter: Payer: Self-pay | Admitting: Endocrinology

## 2018-05-27 ENCOUNTER — Ambulatory Visit (INDEPENDENT_AMBULATORY_CARE_PROVIDER_SITE_OTHER): Payer: Medicare Other | Admitting: Endocrinology

## 2018-05-27 VITALS — BP 122/76 | HR 74 | Ht 65.0 in | Wt 158.0 lb

## 2018-05-27 DIAGNOSIS — E1165 Type 2 diabetes mellitus with hyperglycemia: Secondary | ICD-10-CM | POA: Diagnosis not present

## 2018-05-27 DIAGNOSIS — I10 Essential (primary) hypertension: Secondary | ICD-10-CM | POA: Diagnosis not present

## 2018-05-27 LAB — POCT GLYCOSYLATED HEMOGLOBIN (HGB A1C): Hemoglobin A1C: 6.6 % — AB (ref 4.0–5.6)

## 2018-05-27 MED ORDER — SIMVASTATIN 20 MG PO TABS: 20.0000 mg | ORAL_TABLET | Freq: Every day | ORAL | 3 refills | Status: DC

## 2018-05-27 NOTE — Progress Notes (Signed)
j         Patient ID: Pam Obrien, female   DOB: [birth year], 72 y.o.   MRN: [id number]    Reason for Appointment: Followup for Type 2 Diabetes   History of Present Illness:          Diagnosis: Type 2 diabetes mellitus, date of diagnosis:  4/13      Past history:  She was initially started on metformin but he thinks that this made her sleepy and had some abdominal discomfort   She also has been tried on other oral hypoglycemic regimens including Jentadueto  since 2014  But she had similar side effects with this  And not clear how regularly she had been taking this until it was discontinued in 5/15  A1c appears to be persistently over 8% Since 2014  She was tried on Invokamet by her PCP but because of excessive urination with this and also some nausea and weakness it was stopped With glyburide alone her blood sugars are poorly controlled and she was referred here for further management She was given Januvia but she did not take this because of expense of the medication   She was referred to the nurse educator and started on basal insulin to improve her control on 06/07/14 This was changed to premixed insulin in 9/15 because of continued poor control and her not being able to afford NovoLog mix insulin or Invokana  She stopped taking insulin in 1/16 because of the cost and was taking only Amaryl.  Recent history:   Hypoglycemic drugs: Glimepiride 4 mg at supper, metformin ER 500 mg in a.m. and pm    Although on her last visit her A1c was 7% it is now 6.6 %  Current management, blood sugar patterns and problems identified:  She has increased her metformin to 500 mg twice daily as recommended and is tolerating this fairly well  She thinks that her new generic does not upset her stomach  Also has lost couple pounds, previously was having tendency to gain weight  May well be cutting back on carbohydrates and fruits also has recommended  However she does not exercise or walk as  directed despite recommendations on each visit  No hypoglycemia with Amaryl  Again despite reminders she only checks readings in the mornings  Glucose monitoring with Accu-Chek meter:   Mean values apply above for all meters except median for One Touch  PRE-MEAL Fasting  midday Dinner Bedtime Overall  Glucose range:  86-150  149-158     Mean/median:  122  155    129   POST-MEAL PC Breakfast PC Lunch PC Dinner  Glucose range:   ?  Mean/median:        Side effects from medications have been: Metformin higher doses causes abdominal discomfort Compliance with the medical regimen: fair  Self-care: The diet that the patient has been following is: tries to limit  portions      Meals:  usually 2-3 meals per day. Usually has variable intake in the morning, sometimes toast/fruit, usually has carbohydrates at dinner time, will have fruit for snacks           Exercise: walking 0-1 days a week       Dietician visit: Most recent: 6/17 .              CDE visit last in 7/15  Wt Readings from Last 3 Encounters:  05/27/18 158 lb (71.7 kg)  04/24/18 159 lb (72.1 kg)  02/25/18  161 lb 12.8 oz (73.4 kg)       Glycemic control:   Lab Results  Component Value Date   HGBA1C 6.6 (A) 05/27/2018   HGBA1C 7.0 (H) 02/20/2018   HGBA1C 6.5 09/23/2017   Lab Results  Component Value Date   MICROALBUR 2.6 (H) 02/20/2018   LDLCALC 138 (H) 02/20/2018   CREATININE 1.19 02/20/2018    Office Visit on 05/27/2018  Component Date Value Ref Range Status  . Hemoglobin A1C 05/27/2018 6.6* 4.0 - 5.6 % Final     Weight history:  Previous range upto 172   Wt Readings from Last 3 Encounters:  05/27/18 158 lb (71.7 kg)  04/24/18 159 lb (72.1 kg)  02/25/18 161 lb 12.8 oz (73.4 kg)    Allergies as of 05/27/2018      Reactions   Aspirin    REACTION: nervous   Beef-derived Products    Sneezing, nasal drainage, throat scratchy   Celecoxib    REACTION: joints swell   Milk-related Compounds     itching   Mold Extract [trichophyton]    Pt does not know the reaction.  Positive allergy test per pt.   Nsaids    REACTION: sweling   Peanuts [peanut Oil]    Throat feels scratchy   Shellfish Allergy    Nasal congestion, throat scratchy, eyes watery   Sulfamethoxazole    REACTION: urticaria (hives)   Vioxx [rofecoxib] Swelling      Medication List        Accurate as of 05/27/18 11:59 PM. Always use your most recent med list.          ACCU-CHEK AVIVA PLUS test strip Generic drug:  glucose blood USE AS DIRECTED TO CHECK BLOOD SUGAR ONCE DAILY   ACCU-CHEK SOFTCLIX LANCETS lancets USE AS DIRECTED EVERY DAY   atenolol-chlorthalidone 50-25 MG tablet Commonly known as:  TENORETIC Take 1 tablet by mouth daily.   calcium citrate-vitamin D 315-200 MG-UNIT tablet Commonly known as:  CITRACAL+D Take 1 tablet by mouth daily.   cholecalciferol 1000 units tablet Commonly known as:  VITAMIN D Take 2,000 Units by mouth daily.   glimepiride 4 MG tablet Commonly known as:  AMARYL TAKE 1 TABLET EVERY DAY AT DINNER AND 1/2 TABLET AT BEDTIME   loratadine 10 MG tablet Commonly known as:  CLARITIN Take 1 tablet (10 mg total) by mouth 2 (two) times daily as needed for allergies.   metFORMIN 500 MG 24 hr tablet Commonly known as:  GLUCOPHAGE-XR TAKE 2 TABLETS EVERY DAY WITH SUPPER   omeprazole 20 MG capsule Commonly known as:  PRILOSEC Take 20 mg by mouth daily.   SE-TAN PLUS 162-115.2-1 MG Caps Take 1 capsule by mouth daily.   simvastatin 20 MG tablet Commonly known as:  ZOCOR Take 1 tablet (20 mg total) by mouth at bedtime.   spironolactone 50 MG tablet Commonly known as:  ALDACTONE TAKE 1 TABLET BY MOUTH EVERY DAY       Allergies:  Allergies  Allergen Reactions  . Aspirin     REACTION: nervous  . Beef-Derived Products     Sneezing, nasal drainage, throat scratchy  . Celecoxib     REACTION: joints swell  . Milk-Related Compounds     itching  . Mold Extract  [Trichophyton]     Pt does not know the reaction.  Positive allergy test per pt.  . Nsaids     REACTION: sweling  . Peanuts [Peanut Oil]     Throat feels scratchy  .  Shellfish Allergy     Nasal congestion, throat scratchy, eyes watery  . Sulfamethoxazole     REACTION: urticaria (hives)  . Vioxx [Rofecoxib] Swelling    Past Medical History:  Diagnosis Date  . Allergy   . Anemia    as a young adult  . Arthritis   . Diabetes mellitus without complication ([place])   . Hyperlipidemia    was normal range 05/2017 with PCP  . Hypertension     Past Surgical History:  Procedure Laterality Date  . ABDOMINAL HYSTERECTOMY  1994  . COLONOSCOPY  2008  . FLEXIBLE SIGMOIDOSCOPY  2003  . POLYPECTOMY      Family History  Problem Relation Age of Onset  . Hypertension Sister   . Stroke Unknown   . Diabetes Father   . Hypertension Father   . Stroke Father   . Diabetes Sister   . Diabetes Brother   . Hypertension Brother   . Stroke Brother   . Esophageal cancer Neg Hx   . Rectal cancer Neg Hx   . Stomach cancer Neg Hx   . Colon cancer Neg Hx     Social History:  reports that she has never smoked. She has never used smokeless tobacco. She reports that she does not drink alcohol or use drugs.    Review of Systems   Retinal exam: Most recent: 3/18  HYPERTENSION: She takes a half a tablet of atenolol/chlorthalidone and followed by PCP  HYPOKALEMIA: Likely from HCTZ, managed with Aldactone This is again controlled with taking Aldactone 50 mg   Lab Results  Component Value Date   CREATININE 1.19 02/20/2018   BUN 28 (H) 02/20/2018   NA 140 02/20/2018   K 4.7 02/20/2018   CL 101 02/20/2018   CO2 28 02/20/2018    Lipids: She does not take pravastatin as prescribed because of cost She was recommended taking simvastatin but she did not pick this up and not clear if she was concerned about the cost also She was told to tell us if [facility] would be less expensive  LDL is  significantly high as of 4/19  She does have family history of CVA but no known history of CAD       Lab Results  Component Value Date   CHOL 205 (H) 02/20/2018   HDL 53.00 02/20/2018   LDLCALC 138 (H) 02/20/2018   LDLDIRECT 130.0 05/17/2016   TRIG 74.0 02/20/2018   CHOLHDL 4 02/20/2018    Last foot exam was in 01/2017, also had no symptoms of neuropathy   Physical Examination:  BP 122/76   Pulse 74   Ht 5\' 5"  (1.651 m)   Wt 158 lb (71.7 kg)   SpO2 98%   BMI 26.29 kg/m   No edema present      ASSESSMENT:  Diabetes type 2 with BMI now 26  See history of present illness for detailed discussion of  current management, blood sugar patterns and problems identified  A1c is back to 6.6 This is with improving her diet, increasing her metformin to twice daily and has been able to lose a little weight gain Encouraged her to continue doing so and no change in medications   Hypertension: Blood pressure is normal, will continue to monitor and she will follow-up with PCP also Potassium stable  LIPIDS: She we will again prescribe Zocor 20 mg daily, if this is more expensive at [facility] will [facility]   LDL will be checked on  her next visit  PLAN:    Recommended some readings after meals also  Follow-up in 4 months    Patient Instructions  Check blood sugars on waking up  2-3/7  Also check blood sugars about 2 hours after a meal and do this after different meals by rotation  Recommended blood sugar levels on waking up is 90-130 and about 2 hours after meal is 130-160  Please bring your blood sugar monitor to each visit, thank you         Elayne Snare 05/28/2018, 12:33 PM   Note: This office note was prepared with Dragon voice recognition system technology. Any transcriptional errors that result from this process are unintentional.

## 2018-05-27 NOTE — Patient Instructions (Signed)
Check blood sugars on waking up  2-3/7  Also check blood sugars about 2 hours after a meal and do this after different meals by rotation  Recommended blood sugar levels on waking up is 90-130 and about 2 hours after meal is 130-160  Please bring your blood sugar monitor to each visit, thank you   

## 2018-05-28 ENCOUNTER — Encounter: Payer: Medicare Other | Admitting: Adult Health

## 2018-06-15 ENCOUNTER — Ambulatory Visit
Admission: RE | Admit: 2018-06-15 | Discharge: 2018-06-15 | Disposition: A | Discharge status: Home / Self Care | Payer: Medicare Other | Source: Ambulatory Visit | Attending: Adult Health | Admitting: Adult Health

## 2018-06-15 DIAGNOSIS — Z1231 Encounter for screening mammogram for malignant neoplasm of breast: Secondary | ICD-10-CM

## 2018-06-17 ENCOUNTER — Ambulatory Visit (INDEPENDENT_AMBULATORY_CARE_PROVIDER_SITE_OTHER): Payer: Medicare Other | Admitting: Adult Health

## 2018-06-17 ENCOUNTER — Encounter: Payer: Self-pay | Admitting: Adult Health

## 2018-06-17 VITALS — BP 100/70 | Temp 98.2°F | Ht 61.5 in | Wt 159.0 lb

## 2018-06-17 DIAGNOSIS — Z1159 Encounter for screening for other viral diseases: Secondary | ICD-10-CM | POA: Diagnosis not present

## 2018-06-17 DIAGNOSIS — E1165 Type 2 diabetes mellitus with hyperglycemia: Secondary | ICD-10-CM | POA: Diagnosis not present

## 2018-06-17 DIAGNOSIS — I10 Essential (primary) hypertension: Secondary | ICD-10-CM

## 2018-06-17 DIAGNOSIS — E785 Hyperlipidemia, unspecified: Secondary | ICD-10-CM

## 2018-06-17 LAB — HEPATIC FUNCTION PANEL
ALT: 9 U/L (ref 0–35)
AST: 12 U/L (ref 0–37)
Albumin: 4.5 g/dL (ref 3.5–5.2)
Alkaline Phosphatase: 85 U/L (ref 39–117)
Bilirubin, Direct: 0.1 mg/dL (ref 0.0–0.3)
Total Bilirubin: 0.4 mg/dL (ref 0.2–1.2)
Total Protein: 7.6 g/dL (ref 6.0–8.3)

## 2018-06-17 LAB — BASIC METABOLIC PANEL
BUN: 16 mg/dL (ref 6–23)
CO2: 30 mEq/L (ref 19–32)
Calcium: 9.7 mg/dL (ref 8.4–10.5)
Chloride: 103 mEq/L (ref 96–112)
Creatinine, Ser: 1.17 mg/dL (ref 0.40–1.20)
GFR: 58.22 mL/min — ABNORMAL LOW (ref >60.00)
Glucose, Bld: 151 mg/dL — ABNORMAL HIGH (ref 70–99)
Potassium: 4.8 mEq/L (ref 3.5–5.1)
Sodium: 141 mEq/L (ref 135–145)

## 2018-06-17 LAB — TSH: TSH: 3.77 u[IU]/mL (ref 0.35–4.50)

## 2018-06-17 LAB — CBC WITH DIFFERENTIAL/PLATELET
Basophils Absolute: 0 10*3/uL (ref 0.0–0.1)
Basophils Relative: 0.3 % (ref 0.0–3.0)
Eosinophils Absolute: 0.3 10*3/uL (ref 0.0–0.7)
Eosinophils Relative: 4.6 % (ref 0.0–5.0)
HCT: 34 % — ABNORMAL LOW (ref 36.0–46.0)
Hemoglobin: 11.1 g/dL — ABNORMAL LOW (ref 12.0–15.0)
Lymphocytes Relative: 32.9 % (ref 12.0–46.0)
Lymphs Abs: 2.5 10*3/uL (ref 0.7–4.0)
MCHC: 32.6 g/dL (ref 30.0–36.0)
MCV: 82 fl (ref 78.0–100.0)
Monocytes Absolute: 0.6 10*3/uL (ref 0.1–1.0)
Monocytes Relative: 8.3 % (ref 3.0–12.0)
Neutro Abs: 4 10*3/uL (ref 1.4–7.7)
Neutrophils Relative %: 53.9 % (ref 43.0–77.0)
Platelets: 322 10*3/uL (ref 150.0–400.0)
RBC: 4.15 Mil/uL (ref 3.87–5.11)
RDW: 13 % (ref 11.5–15.5)
WBC: 7.5 10*3/uL (ref 4.0–10.5)

## 2018-06-17 LAB — LIPID PANEL
Cholesterol: 228 mg/dL — ABNORMAL HIGH (ref 0–200)
HDL: 49 mg/dL (ref >39.00)
LDL Cholesterol: 152 mg/dL — ABNORMAL HIGH (ref 0–99)
NonHDL: 179.17
Total CHOL/HDL Ratio: 5
Triglycerides: 138 mg/dL (ref 0.0–149.0)
VLDL: 27.6 mg/dL (ref 0.0–40.0)

## 2018-06-17 NOTE — Progress Notes (Signed)
Subjective:    Patient ID: Pam Obrien, female    DOB: 1946/09/21, 72 y.o.   MRN: [id number]  HPI  Patient presents for yearly follow up examination. She is a pleasant 72 year old female who  has a past medical history of Allergy, Anemia, Arthritis, Diabetes mellitus without complication ([place]), Hyperlipidemia, and Hypertension.  DM - she is followed by Endocrinology.  Her only being managed with metformin 500 mg XR ( two tabs at dinner)  and Amaryl 4 mg daily. Her A1c has greatly improved over the last year.  Lab Results  Component Value Date   HGBA1C 6.6 (A) 05/27/2018   Essential Hypertension - controlled with 1/2 tab of atenolol/chlorthalidone. She denies dizziness, lightheadedness, or syncopal episodes  BP Readings from Last 3 Encounters:  05/27/18 122/76  02/25/18 126/86  09/26/17 110/70   Hyperlipidemia - Prescribed simvastatin by Endocrinology. She reports that she has not started this medication yet  Lab Results  Component Value Date   CHOL 205 (H) 02/20/2018   HDL 53.00 02/20/2018   LDLCALC 138 (H) 02/20/2018   LDLDIRECT 130.0 05/17/2016   TRIG 74.0 02/20/2018   CHOLHDL 4 02/20/2018    All immunizations and health maintenance protocols were reviewed with the patient and needed orders were placed.  She is due for tetanus booster, but insurance will not pay for this  Appropriate screening laboratory values were ordered for the patient including screening of hyperlipidemia, renal function and hepatic function.  Medication reconciliation,  past medical history, social history, problem list and allergies were reviewed in detail with the patient  Goals were established with regard to weight loss, exercise, and  diet in compliance with medications  End of life planning was discussed.   She is up-to-date on her mammogram and colonoscopy.  She has no acute complaints today   Review of Systems  Constitutional: Negative.   HENT: Negative.   Eyes: Negative.     Respiratory: Negative.   Cardiovascular: Negative.   Gastrointestinal: Negative.   Endocrine: Negative.   Genitourinary: Negative.   Musculoskeletal: Negative.   Skin: Negative.   Allergic/Immunologic: Negative.   Neurological: Negative.   Hematological: Negative.   Psychiatric/Behavioral: Negative.    Past Medical History:  Diagnosis Date  . Allergy   . Anemia    as a young adult  . Arthritis   . Diabetes mellitus without complication ([place])   . Hyperlipidemia    was normal range 05/2017 with PCP  . Hypertension     Social History   Socioeconomic History  . Marital status: Married    Spouse name: Not on file  . Number of children: Not on file  . Years of education: Not on file  . Highest education level: Not on file  Occupational History  . Not on file  Social Needs  . Financial resource strain: Not on file  . Food insecurity:    Worry: Not on file    Inability: Not on file  . Transportation needs:    Medical: Not on file    Non-medical: Not on file  Tobacco Use  . Smoking status: Never Smoker  . Smokeless tobacco: Never Used  Substance and Sexual Activity  . Alcohol use: No  . Drug use: No  . Sexual activity: Not on file  Lifestyle  . Physical activity:    Days per week: Not on file    Minutes per session: Not on file  . Stress: Not on file  Relationships  . Social  connections:    Talks on phone: Not on file    Gets together: Not on file    Attends religious service: Not on file    Active member of club or organization: Not on file    Attends meetings of clubs or organizations: Not on file    Relationship status: Not on file  . Intimate partner violence:    Fear of current or ex partner: Not on file    Emotionally abused: Not on file    Physically abused: Not on file    Forced sexual activity: Not on file  Other Topics Concern  . Not on file  Social History Narrative   Retired from [employer] work    Separated for 9 years.    Five children, two  live locally, three are in [place]   One of her daughters live with her   Has a dog.    She likes to dance and walk    Past Surgical History:  Procedure Laterality Date  . ABDOMINAL HYSTERECTOMY  1994  . COLONOSCOPY  2008  . FLEXIBLE SIGMOIDOSCOPY  2003  . POLYPECTOMY      Family History  Problem Relation Age of Onset  . Hypertension Sister   . Stroke Unknown   . Diabetes Father   . Hypertension Father   . Stroke Father   . Diabetes Sister   . Diabetes Brother   . Hypertension Brother   . Stroke Brother   . Esophageal cancer Neg Hx   . Rectal cancer Neg Hx   . Stomach cancer Neg Hx   . Colon cancer Neg Hx     Allergies  Allergen Reactions  . Aspirin     REACTION: nervous  . Beef-Derived Products     Sneezing, nasal drainage, throat scratchy  . Celecoxib     REACTION: joints swell  . Milk-Related Compounds     itching  . Mold Extract [Trichophyton]     Pt does not know the reaction.  Positive allergy test per pt.  . Nsaids     REACTION: sweling  . Peanuts [Peanut Oil]     Throat feels scratchy  . Shellfish Allergy     Nasal congestion, throat scratchy, eyes watery  . Sulfamethoxazole     REACTION: urticaria (hives)  . Vioxx [Rofecoxib] Swelling    Current Outpatient Medications on File Prior to Visit  Medication Sig Dispense Refill  . ACCU-CHEK AVIVA PLUS test strip USE AS DIRECTED TO CHECK BLOOD SUGAR ONCE DAILY 50 each 2  . ACCU-CHEK SOFTCLIX LANCETS lancets USE AS DIRECTED EVERY DAY 100 each 1  . atenolol-chlorthalidone (TENORETIC) 50-25 MG tablet Take 1 tablet by mouth daily. 90 tablet 3  . calcium citrate-vitamin D (CITRACAL+D) 315-200 MG-UNIT per tablet Take 1 tablet by mouth daily.    . cholecalciferol (VITAMIN D) 1000 UNITS tablet Take 2,000 Units by mouth daily.    Marland Kitchen FeFum-FePo-FA-B Cmp-C-Zn-Mn-Cu (SE-TAN PLUS) 162-115.2-1 MG CAPS Take 1 capsule by mouth daily. 90 capsule 3  . glimepiride (AMARYL) 4 MG tablet TAKE 1 TABLET EVERY DAY AT DINNER AND  1/2 TABLET AT BEDTIME 45 tablet 2  . loratadine (CLARITIN) 10 MG tablet Take 1 tablet (10 mg total) by mouth 2 (two) times daily as needed for allergies. 180 tablet 3  . metFORMIN (GLUCOPHAGE-XR) 500 MG 24 hr tablet TAKE 2 TABLETS EVERY DAY WITH SUPPER 60 tablet 3  . omeprazole (PRILOSEC) 20 MG capsule Take 20 mg by mouth daily.    Marland Kitchen  simvastatin (ZOCOR) 20 MG tablet Take 1 tablet (20 mg total) by mouth at bedtime. 30 tablet 3  . spironolactone (ALDACTONE) 50 MG tablet TAKE 1 TABLET BY MOUTH EVERY DAY 30 tablet 3   No current facility-administered medications on file prior to visit.     There were no vitals taken for this visit.      Objective:   Physical Exam  Constitutional: She is oriented to person, place, and time. She appears well-developed and well-nourished. No distress.  HENT:  Head: Normocephalic and atraumatic.  Right Ear: External ear normal.  Left Ear: External ear normal.  Nose: Nose normal.  Mouth/Throat: Oropharynx is clear and moist and mucous membranes are normal. Abnormal dentition. No oropharyngeal exudate.  Eyes: Pupils are equal, round, and reactive to light. Conjunctivae and EOM are normal. Right eye exhibits no discharge. Left eye exhibits no discharge. No scleral icterus.  Neck: Normal range of motion. Neck supple. No JVD present. No tracheal deviation present. No thyromegaly present.  Cardiovascular: Normal rate, regular rhythm, normal heart sounds and intact distal pulses. Exam reveals no gallop and no friction rub.  No murmur heard. Pulmonary/Chest: Effort normal and breath sounds normal. No stridor. No respiratory distress. She has no wheezes. She has no rales. She exhibits no tenderness.  Abdominal: Soft. Bowel sounds are normal. She exhibits no distension and no mass. There is no tenderness. There is no rebound and no guarding. No hernia.  Musculoskeletal: Normal range of motion. She exhibits no edema, tenderness or deformity.  Lymphadenopathy:    She has  no cervical adenopathy.  Neurological: She is alert and oriented to person, place, and time. She displays normal reflexes. No cranial nerve deficit or sensory deficit. She exhibits normal muscle tone. Coordination normal.  Skin: Skin is warm and dry. Capillary refill takes less than 2 seconds. No rash noted. She is not diaphoretic. No erythema. No pallor.  Psychiatric: She has a normal mood and affect. Her behavior is normal. Judgment and thought content normal.  Nursing note and vitals reviewed.     Assessment & Plan:  1. Essential hypertension - Well controlled.  - No change in medication  - Basic metabolic panel - CBC with Differential/Platelet - Hepatic function panel - TSH - Lipid panel  2. Uncontrolled type 2 diabetes mellitus with hyperglycemia (HCC) - Encouraged heart healthy diet and exercise - Basic metabolic panel - CBC with Differential/Platelet - Hepatic function panel - TSH - Lipid panel  3. Hyperlipidemia, unspecified hyperlipidemia type -Encouraged to start stain  - Basic metabolic panel - CBC with Differential/Platelet - Hepatic function panel - TSH - Lipid panel  4. Need for hepatitis C screening test - Hep C Antibody   [name], NP

## 2018-06-17 NOTE — Patient Instructions (Signed)
It was great seeing you today   Please follow up with me in one year for your next physical or sooner if needed  We will follow up with you regarding your blood work

## 2018-06-18 LAB — HEPATITIS C ANTIBODY
Hepatitis C Ab: NONREACTIVE
SIGNAL TO CUT-OFF: 0.01 (ref <1.00)

## 2018-07-07 ENCOUNTER — Encounter: Payer: Self-pay | Admitting: Family Medicine

## 2018-07-09 ENCOUNTER — Other Ambulatory Visit: Payer: Self-pay | Admitting: Endocrinology

## 2018-07-20 ENCOUNTER — Other Ambulatory Visit: Payer: Self-pay | Admitting: Adult Health

## 2018-07-21 ENCOUNTER — Other Ambulatory Visit: Payer: Self-pay | Admitting: Adult Health

## 2018-07-21 NOTE — Telephone Encounter (Signed)
Should pt continue to take ?

## 2018-07-28 ENCOUNTER — Telehealth: Payer: Self-pay | Admitting: Endocrinology

## 2018-07-28 ENCOUNTER — Encounter (HOSPITAL_COMMUNITY): Payer: Self-pay | Admitting: Emergency Medicine

## 2018-07-28 ENCOUNTER — Ambulatory Visit (HOSPITAL_COMMUNITY)
Admission: EM | Admit: 2018-07-28 | Discharge: 2018-07-28 | Disposition: A | Discharge status: Home / Self Care | Payer: Medicare Other | Attending: Family Medicine | Admitting: Family Medicine

## 2018-07-28 ENCOUNTER — Other Ambulatory Visit: Payer: Self-pay

## 2018-07-28 DIAGNOSIS — B353 Tinea pedis: Secondary | ICD-10-CM

## 2018-07-28 MED ORDER — CLOTRIMAZOLE-BETAMETHASONE 1-0.05 % EX CREA: TOPICAL_CREAM | CUTANEOUS | 0 refills | Status: DC

## 2018-07-28 NOTE — Telephone Encounter (Signed)
error 

## 2018-07-28 NOTE — ED Triage Notes (Signed)
The patient presented to the [facility] with a complaint of left foot itching x 2 days. The patient denied any known injury. The patient presented to triage with an ice pack on her left foot.

## 2018-07-28 NOTE — ED Provider Notes (Signed)
[place]    CSN: [id number] Arrival date & time: 07/28/18  1201     History   Chief Complaint Chief Complaint  Patient presents with  . Foot Pain    HPI Pam Obrien is a 72 y.o. female.   The patient presented to the [facility] with a complaint of left foot itching x 2 days. The patient denied any known injury. The patient presented to triage with an ice pack on her left foot.  Patient has a history of diabetes which is been well controlled.     Past Medical History:  Diagnosis Date  . Allergy   . Anemia    as a young adult  . Arthritis   . Diabetes mellitus without complication ([place])   . Hyperlipidemia    was normal range 05/2017 with PCP  . Hypertension     Patient Active Problem List   Diagnosis Date Noted  . Hyperlipidemia 07/21/2014  . Type II diabetes mellitus, uncontrolled ([place]) 01/04/2013  . ESOPHAGEAL REFLUX 06/18/2010  . HYPOKALEMIA, MILD 06/02/2009  . Disorder of bone and cartilage 12/11/2007  . COLONIC POLYPS, HX OF 12/11/2007  . Essential hypertension 06/23/2007  . ALLERGIC RHINITIS 06/23/2007  . OSTEOARTHRITIS 06/23/2007    Past Surgical History:  Procedure Laterality Date  . ABDOMINAL HYSTERECTOMY  1994  . COLONOSCOPY  2008  . FLEXIBLE SIGMOIDOSCOPY  2003  . POLYPECTOMY      OB History   None      Home Medications    Prior to Admission medications   Medication Sig Start Date End Date Taking? Authorizing Provider  ACCU-CHEK AVIVA PLUS test strip USE AS DIRECTED TO CHECK BLOOD SUGAR ONCE DAILY 12/21/17   Elayne Snare, MD  ACCU-CHEK SOFTCLIX LANCETS lancets USE AS DIRECTED EVERY DAY 07/09/18   Elayne Snare, MD  atenolol-chlorthalidone (TENORETIC) 50-25 MG tablet Take 1 tablet by mouth daily. 03/24/18   Marin Olp, MD  calcium citrate-vitamin D (CITRACAL+D) 315-200 MG-UNIT per tablet Take 1 tablet by mouth daily.    [provider]  cholecalciferol (VITAMIN D) 1000 UNITS tablet Take 2,000 Units by mouth daily.     [provider]  FeFum-FePo-FA-B Cmp-C-Zn-Mn-Cu (SE-TAN PLUS) 162-115.2-1 MG CAPS TAKE 1 CAPSULE BY MOUTH EVERY DAY 07/21/18   Nafziger, [name], NP  glimepiride (AMARYL) 4 MG tablet TAKE 1 TABLET EVERY DAY AT [facility] AND 1/2 TABLET AT BEDTIME 05/18/18   Elayne Snare, MD  loratadine (CLARITIN) 10 MG tablet Take 1 tablet (10 mg total) by mouth 2 (two) times daily as needed for allergies. 03/14/14   Ricard Dillon, MD  metFORMIN (GLUCOPHAGE-XR) 500 MG 24 hr tablet TAKE 2 TABLETS EVERY DAY WITH SUPPER 05/18/18   Elayne Snare, MD  omeprazole (PRILOSEC) 20 MG capsule Take 20 mg by mouth daily.    [provider]  simvastatin (ZOCOR) 20 MG tablet Take 1 tablet (20 mg total) by mouth at bedtime. Patient not taking: Reported on 06/17/2018 05/27/18   Elayne Snare, MD  spironolactone (ALDACTONE) 50 MG tablet TAKE 1 TABLET BY MOUTH EVERY DAY 05/18/18   Elayne Snare, MD    Family History Family History  Problem Relation Age of Onset  . Hypertension Sister   . Stroke Unknown   . Diabetes Father   . Hypertension Father   . Stroke Father   . Diabetes Sister   . Diabetes Brother   . Hypertension Brother   . Stroke Brother   . Esophageal cancer Neg Hx   .  Rectal cancer Neg Hx   . Stomach cancer Neg Hx   . Colon cancer Neg Hx     Social History Social History   Tobacco Use  . Smoking status: Never Smoker  . Smokeless tobacco: Never Used  Substance Use Topics  . Alcohol use: No  . Drug use: No     Allergies   Aspirin; Beef-derived products; Celecoxib; Milk-related compounds; Mold extract [trichophyton]; Nsaids; Peanuts [peanut oil]; Shellfish allergy; Sulfamethoxazole; and Vioxx [rofecoxib]   Review of Systems Review of Systems  Skin: Positive for rash.  All other systems reviewed and are negative.    Physical Exam Triage Vital Signs ED Triage Vitals [07/28/18 1246]  Enc Vitals Group     BP 127/63     Pulse Rate 79     Resp 18     Temp 98.1 F (36.7 C)     Temp Source Oral      SpO2 99 %     Weight      Height      Head Circumference      Peak Flow      Pain Score 0     Pain Loc      Pain Edu?      Excl. in [place]?    No data found.  Updated Vital Signs BP 127/63 (BP Location: Left Arm)   Pulse 79   Temp 98.1 F (36.7 C) (Oral)   Resp 18   SpO2 99%   Physical Exam  Constitutional: She is oriented to person, place, and time. She appears well-developed and well-nourished.  Musculoskeletal: Normal range of motion.  Neurological: She is alert and oriented to person, place, and time.  Skin: Skin is warm and dry. There is erythema.  The sole of the left foot is mildly erythematous and excoriated.  There is no interdigital exudate or redness.  Psychiatric: She has a normal mood and affect. Her behavior is normal.  Nursing note and vitals reviewed.    UC Treatments / Results  Labs (all labs ordered are listed, but only abnormal results are displayed) Labs Reviewed - No data to display  EKG None  Radiology No results found.  Procedures Procedures (including critical care time)  Medications Ordered in UC Medications - No data to display  Initial Impression / Assessment and Plan / UC Course  I have reviewed the triage vital signs and the nursing notes.  Pertinent labs & imaging results that were available during my care of the patient were reviewed by me and considered in my medical decision making (see chart for details).    Final Clinical Impressions(s) / UC Diagnoses   Final diagnoses:  None   Discharge Instructions   None    ED Prescriptions    None     Controlled Substance Prescriptions [place] Controlled Substance Registry consulted? Not Applicable   Robyn Haber, MD 07/28/18 1315

## 2018-07-30 ENCOUNTER — Encounter (HOSPITAL_COMMUNITY): Payer: Self-pay | Admitting: Emergency Medicine

## 2018-07-30 ENCOUNTER — Ambulatory Visit (HOSPITAL_COMMUNITY)
Admission: EM | Admit: 2018-07-30 | Discharge: 2018-07-30 | Disposition: A | Discharge status: Home / Self Care | Payer: Medicare Other | Attending: Family Medicine | Admitting: Family Medicine

## 2018-07-30 DIAGNOSIS — M79672 Pain in left foot: Secondary | ICD-10-CM | POA: Diagnosis not present

## 2018-07-30 MED ORDER — DICLOFENAC SODIUM 1 % TD GEL: 2.0000 g | Freq: Four times a day (QID) | TRANSDERMAL | 0 refills | Status: DC

## 2018-07-30 MED ORDER — ACETAMINOPHEN 500 MG PO TABS: 500.0000 mg | ORAL_TABLET | Freq: Four times a day (QID) | ORAL | 0 refills | Status: AC | PRN

## 2018-07-30 NOTE — ED Provider Notes (Signed)
[place]    CSN: [id number] Arrival date & time: 07/30/18  1600     History   Chief Complaint Chief Complaint  Patient presents with  . Foot Pain    HPI Pam Obrien is a 72 y.o. female.   Pam Obrien presents with complaints of left foot pain which has been ongoing for the past week approximately. Was seen 9/17 and prescribed lotrisone, as patient had itching at that time. This has not helped. Pain comes and goes. Improves with ibuprofen which she took this morning. Currently no pain. Has a hard time sleeping due to pain. Does not typically go barefoot at home. No injury. Denies previous similar. She is not on her feet excessively. Hx of dm, hyperlipidemia, htn.     ROS per HPI.      Past Medical History:  Diagnosis Date  . Allergy   . Anemia    as a young adult  . Arthritis   . Diabetes mellitus without complication ([place])   . Hyperlipidemia    was normal range 05/2017 with PCP  . Hypertension     Patient Active Problem List   Diagnosis Date Noted  . Hyperlipidemia 07/21/2014  . Type II diabetes mellitus, uncontrolled ([place]) 01/04/2013  . ESOPHAGEAL REFLUX 06/18/2010  . HYPOKALEMIA, MILD 06/02/2009  . Disorder of bone and cartilage 12/11/2007  . COLONIC POLYPS, HX OF 12/11/2007  . Essential hypertension 06/23/2007  . ALLERGIC RHINITIS 06/23/2007  . OSTEOARTHRITIS 06/23/2007    Past Surgical History:  Procedure Laterality Date  . ABDOMINAL HYSTERECTOMY  1994  . COLONOSCOPY  2008  . FLEXIBLE SIGMOIDOSCOPY  2003  . POLYPECTOMY      OB History   None      Home Medications    Prior to Admission medications   Medication Sig Start Date End Date Taking? Authorizing Provider  ACCU-CHEK AVIVA PLUS test strip USE AS DIRECTED TO CHECK BLOOD SUGAR ONCE DAILY 12/21/17   Elayne Snare, MD  ACCU-CHEK SOFTCLIX LANCETS lancets USE AS DIRECTED EVERY DAY 07/09/18   Elayne Snare, MD  acetaminophen (TYLENOL) 500 MG tablet Take 1 tablet (500 mg total) by  mouth every 6 (six) hours as needed. 07/30/18   Zigmund Gottron, NP  atenolol-chlorthalidone (TENORETIC) 50-25 MG tablet Take 1 tablet by mouth daily. 03/24/18   Marin Olp, MD  calcium citrate-vitamin D (CITRACAL+D) 315-200 MG-UNIT per tablet Take 1 tablet by mouth daily.    [provider]  cholecalciferol (VITAMIN D) 1000 UNITS tablet Take 2,000 Units by mouth daily.    [provider]  diclofenac sodium (VOLTAREN) 1 % GEL Apply 2 g topically 4 (four) times daily. 07/30/18   Zigmund Gottron, NP  FeFum-FePo-FA-B Cmp-C-Zn-Mn-Cu (SE-TAN PLUS) 162-115.2-1 MG CAPS TAKE 1 CAPSULE BY MOUTH EVERY DAY 07/21/18   Nafziger, [name], NP  glimepiride (AMARYL) 4 MG tablet TAKE 1 TABLET EVERY DAY AT [facility] AND 1/2 TABLET AT BEDTIME 05/18/18   Elayne Snare, MD  loratadine (CLARITIN) 10 MG tablet Take 1 tablet (10 mg total) by mouth 2 (two) times daily as needed for allergies. 03/14/14   Ricard Dillon, MD  metFORMIN (GLUCOPHAGE-XR) 500 MG 24 hr tablet TAKE 2 TABLETS EVERY DAY WITH SUPPER 05/18/18   Elayne Snare, MD  omeprazole (PRILOSEC) 20 MG capsule Take 20 mg by mouth daily.    [provider]  spironolactone (ALDACTONE) 50 MG tablet TAKE 1 TABLET BY MOUTH EVERY DAY 05/18/18   Elayne Snare, MD    Family  History Family History  Problem Relation Age of Onset  . Hypertension Sister   . Stroke Unknown   . Diabetes Father   . Hypertension Father   . Stroke Father   . Diabetes Sister   . Diabetes Brother   . Hypertension Brother   . Stroke Brother   . Esophageal cancer Neg Hx   . Rectal cancer Neg Hx   . Stomach cancer Neg Hx   . Colon cancer Neg Hx     Social History Social History   Tobacco Use  . Smoking status: Never Smoker  . Smokeless tobacco: Never Used  Substance Use Topics  . Alcohol use: No  . Drug use: No     Allergies   Aspirin; Beef-derived products; Celecoxib; Milk-related compounds; Mold extract [trichophyton]; Nsaids; Peanuts [peanut oil]; Shellfish  allergy; Sulfamethoxazole; and Vioxx [rofecoxib]   Review of Systems Review of Systems   Physical Exam Triage Vital Signs ED Triage Vitals [07/30/18 1628]  Enc Vitals Group     BP 132/72     Pulse Rate 80     Resp 16     Temp 98.6 F (37 C)     Temp Source Oral     SpO2 99 %     Weight      Height      Head Circumference      Peak Flow      Pain Score      Pain Loc      Pain Edu?      Excl. in [place]?    No data found.  Updated Vital Signs BP 132/72 (BP Location: Right Arm)   Pulse 80   Temp 98.6 F (37 C) (Oral)   Resp 16   SpO2 99%    Physical Exam  Constitutional: She is oriented to person, place, and time. She appears well-developed and well-nourished. No distress.  Cardiovascular: Normal rate, regular rhythm and normal heart sounds.  Pulmonary/Chest: Effort normal and breath sounds normal.  Musculoskeletal:       Left ankle: Normal.       Left foot: There is normal range of motion, no tenderness, no bony tenderness, no swelling, normal capillary refill, no crepitus, no deformity and no laceration.  Indicates pain to arch and to forefoot; no pain currently; non tender; no rash; full ROm to foot and ankle; cap refill < 2 seconds ; no redness or swelling  Neurological: She is alert and oriented to person, place, and time.  Skin: Skin is warm and dry.     UC Treatments / Results  Labs (all labs ordered are listed, but only abnormal results are displayed) Labs Reviewed - No data to display  EKG None  Radiology No results found.  Procedures Procedures (including critical care time)  Medications Ordered in UC Medications - No data to display  Initial Impression / Assessment and Plan / UC Course  I have reviewed the triage vital signs and the nursing notes.  Pertinent labs & imaging results that were available during my care of the patient were reviewed by me and considered in my medical decision making (see chart for details).     Has been taking  ibuprofen despite listed allergy to nsaids, tolerating. Will provide topical diclofenac to see if this provides better relief. Tylenol prn. Stretching and massage of arch with arch supports recommended. Follow up with podiatry prn. Patient verbalized understanding and agreeable to plan.   Final Clinical Impressions(s) / UC Diagnoses   Final  diagnoses:  Foot pain, left     Discharge Instructions     Use of tennis ball/ rubber ball under foot to massage arch.  Add arch support to your shoe and keep on at all times with activity, don't go barefoot.  Tylenol for pain control.  Topical voltaren gel- this is an antiinflammatory, monitor for any allergic responses.  Ice application at night.  Please follow up with podiatry if pain persists.  May stop previously ordered cream.    ED Prescriptions    Medication Sig Dispense Auth. Provider   diclofenac sodium (VOLTAREN) 1 % GEL Apply 2 g topically 4 (four) times daily. 1 Tube Augusto Gamble B, NP   acetaminophen (TYLENOL) 500 MG tablet Take 1 tablet (500 mg total) by mouth every 6 (six) hours as needed. 30 tablet Zigmund Gottron, NP     Controlled Substance Prescriptions [place] Controlled Substance Registry consulted? Not Applicable   Zigmund Gottron, NP 07/30/18 1714

## 2018-07-30 NOTE — Discharge Instructions (Signed)
Use of tennis ball/ rubber ball under foot to massage arch.  Add arch support to your shoe and keep on at all times with activity, don't go barefoot.  Tylenol for pain control.  Topical voltaren gel- this is an antiinflammatory, monitor for any allergic responses.  Ice application at night.  Please follow up with podiatry if pain persists.  May stop previously ordered cream.

## 2018-07-30 NOTE — ED Triage Notes (Signed)
Pt sts left foot pain; pt seen here Tuesday for same and given cream; pt sts not helping

## 2018-08-12 ENCOUNTER — Other Ambulatory Visit: Payer: Self-pay | Admitting: Endocrinology

## 2018-08-24 ENCOUNTER — Other Ambulatory Visit: Payer: Medicare Other

## 2018-08-26 ENCOUNTER — Other Ambulatory Visit: Payer: Self-pay | Admitting: Endocrinology

## 2018-08-27 ENCOUNTER — Ambulatory Visit: Payer: Medicare Other | Admitting: Endocrinology

## 2018-09-25 ENCOUNTER — Other Ambulatory Visit: Payer: Self-pay | Admitting: Endocrinology

## 2018-11-16 ENCOUNTER — Other Ambulatory Visit: Payer: Self-pay | Admitting: Endocrinology

## 2018-12-04 ENCOUNTER — Other Ambulatory Visit: Payer: Medicare Other

## 2018-12-04 ENCOUNTER — Telehealth: Payer: Self-pay | Admitting: Endocrinology

## 2018-12-04 NOTE — Telephone Encounter (Signed)
FYI  Patient called requesting same day lab work due to not being able to drive. Confirmed with Dr. Dwyane Dee that patient could do same day lab work on 12/09/18.

## 2018-12-04 NOTE — Telephone Encounter (Signed)
Noted  

## 2018-12-09 ENCOUNTER — Encounter (INDEPENDENT_AMBULATORY_CARE_PROVIDER_SITE_OTHER): Payer: Medicare Other | Admitting: Endocrinology

## 2018-12-09 ENCOUNTER — Other Ambulatory Visit: Payer: Medicare Other

## 2018-12-09 DIAGNOSIS — E1165 Type 2 diabetes mellitus with hyperglycemia: Secondary | ICD-10-CM | POA: Diagnosis not present

## 2018-12-09 LAB — COMPREHENSIVE METABOLIC PANEL
ALT: 13 U/L (ref 0–35)
AST: 16 U/L (ref 0–37)
Albumin: 4.6 g/dL (ref 3.5–5.2)
Alkaline Phosphatase: 100 U/L (ref 39–117)
BUN: 31 mg/dL — ABNORMAL HIGH (ref 6–23)
CO2: 28 mEq/L (ref 19–32)
Calcium: 10.2 mg/dL (ref 8.4–10.5)
Chloride: 96 mEq/L (ref 96–112)
Creatinine, Ser: 1.22 mg/dL — ABNORMAL HIGH (ref 0.40–1.20)
GFR: 52.13 mL/min — ABNORMAL LOW (ref >60.00)
Glucose, Bld: 114 mg/dL — ABNORMAL HIGH (ref 70–99)
Potassium: 4.9 mEq/L (ref 3.5–5.1)
Sodium: 134 mEq/L — ABNORMAL LOW (ref 135–145)
Total Bilirubin: 0.4 mg/dL (ref 0.2–1.2)
Total Protein: 8.2 g/dL (ref 6.0–8.3)

## 2018-12-09 LAB — LIPID PANEL
Cholesterol: 212 mg/dL — ABNORMAL HIGH (ref 0–200)
HDL: 48 mg/dL (ref >39.00)
LDL Cholesterol: 143 mg/dL — ABNORMAL HIGH (ref 0–99)
NonHDL: 164.3
Total CHOL/HDL Ratio: 4
Triglycerides: 109 mg/dL (ref 0.0–149.0)
VLDL: 21.8 mg/dL (ref 0.0–40.0)

## 2018-12-09 LAB — HEMOGLOBIN A1C: Hgb A1c MFr Bld: 6.5 % (ref 4.6–6.5)

## 2018-12-09 NOTE — Progress Notes (Signed)
This encounter was created in error - please disregard.

## 2018-12-22 ENCOUNTER — Ambulatory Visit: Payer: Medicare Other | Admitting: Endocrinology

## 2018-12-22 ENCOUNTER — Encounter: Payer: Self-pay | Admitting: Endocrinology

## 2018-12-22 ENCOUNTER — Ambulatory Visit (INDEPENDENT_AMBULATORY_CARE_PROVIDER_SITE_OTHER): Payer: Medicare Other | Admitting: Endocrinology

## 2018-12-22 VITALS — BP 122/74 | HR 85 | Ht 61.5 in | Wt 149.0 lb

## 2018-12-22 DIAGNOSIS — E1165 Type 2 diabetes mellitus with hyperglycemia: Secondary | ICD-10-CM

## 2018-12-22 DIAGNOSIS — E7800 Pure hypercholesterolemia, unspecified: Secondary | ICD-10-CM

## 2018-12-22 LAB — GLUCOSE, POCT (MANUAL RESULT ENTRY): POC Glucose: 111 mg/dl — AB (ref 70–99)

## 2018-12-22 NOTE — Patient Instructions (Addendum)
Check blood sugars on waking up 3 days a week  Also check blood sugars about 2 hours after meals and do this after different meals by rotation  Recommended blood sugar levels on waking up are 90-130 and about 2 hours after meal is 130-160  Please bring your blood sugar monitor to each visit, thank you  Take Simvastatin 1/2 daily for cholesterol and call if needing new Rx  SPRINOLACTONE TAKE 1/2 PILL DAILY

## 2018-12-22 NOTE — Progress Notes (Signed)
j         Patient ID: Pam Obrien, female   DOB: 05-25-1946, 73 y.o.   MRN: [id number]    Reason for Appointment: Followup for Type 2 Diabetes   History of Present Illness:          Diagnosis: Type 2 diabetes mellitus, date of diagnosis:  4/13      Past history:  She was initially started on metformin but he thinks that this made her sleepy and had some abdominal discomfort   She also has been tried on other oral hypoglycemic regimens including Jentadueto  since 2014  But she had similar side effects with this  And not clear how regularly she had been taking this until it was discontinued in 5/15  A1c appears to be persistently over 8% Since 2014  She was tried on Invokamet by her PCP but because of excessive urination with this and also some nausea and weakness it was stopped With glyburide alone her blood sugars are poorly controlled and she was referred here for further management She was given Januvia but she did not take this because of expense of the medication   She was referred to the nurse educator and started on basal insulin to improve her control on 06/07/14 This was changed to premixed insulin in 9/15 because of continued poor control and her not being able to afford NovoLog mix insulin or Invokana  She stopped taking insulin in 1/16 because of the cost and was taking only Amaryl.  Recent history:   Hypoglycemic drugs: Glimepiride 4 mg at supper, metformin ER 500 mg in a.m. and pm    Her A1c is stable at 6.5, was last seen in the office in 7/19  Current management, blood sugar patterns and problems identified:  She has not checked her blood sugars recently as she does not have a functioning meter  Did not report that she had a problem with her meter  She may be doing better on her diet since her weight has gone down 10 pounds  Has tolerated metformin twice a day and she is taking her Amaryl in the evening as directed also  She also is trying to do a little  more walking when she is able to  No hypoglycemic symptoms  Glucose monitoring with Accu-Chek meter:   No results available  Side effects from medications have been: Metformin higher doses causes abdominal discomfort Compliance with the medical regimen: fair  Self-care: The diet that the patient has been following is: tries to limit  portions      Meals:  usually 2-3 meals per day. Usually has variable intake in the morning, sometimes toast/fruit, usually has carbohydrates at dinner time, will have fruit for snacks           Exercise: walking 0-2 days a week       Dietician visit: Most recent: 6/17 .              CDE visit last in 7/15  Wt Readings from Last 3 Encounters:  12/22/18 149 lb (67.6 kg)  06/17/18 159 lb (72.1 kg)  05/27/18 158 lb (71.7 kg)       Glycemic control:   Lab Results  Component Value Date   HGBA1C 6.5 12/09/2018   HGBA1C 6.6 (A) 05/27/2018   HGBA1C 7.0 (H) 02/20/2018   Lab Results  Component Value Date   MICROALBUR 2.6 (H) 02/20/2018   LDLCALC 143 (H) 12/09/2018   CREATININE 1.22 (H) 12/09/2018  Office Visit on 12/22/2018  Component Date Value Ref Range Status  . POC Glucose 12/22/2018 111* 70 - 99 mg/dl Final     Weight history:  Previous range upto 172   Wt Readings from Last 3 Encounters:  12/22/18 149 lb (67.6 kg)  06/17/18 159 lb (72.1 kg)  05/27/18 158 lb (71.7 kg)    Allergies as of 12/22/2018      Reactions   Aspirin    REACTION: nervous   Beef-derived Products    Sneezing, nasal drainage, throat scratchy   Celecoxib    REACTION: joints swell   Milk-related Compounds    itching   Mold Extract [trichophyton]    Pt does not know the reaction.  Positive allergy test per pt.   Nsaids    REACTION: sweling   Peanuts [peanut Oil]    Throat feels scratchy   Shellfish Allergy    Nasal congestion, throat scratchy, eyes watery   Sulfamethoxazole    REACTION: urticaria (hives)   Vioxx [rofecoxib] Swelling      Medication  List       Accurate as of December 22, 2018 11:59 PM. Always use your most recent med list.        ACCU-CHEK AVIVA PLUS test strip Generic drug:  glucose blood USE AS DIRECTED TO CHECK BLOOD SUGAR ONCE DAILY   ACCU-CHEK AVIVA PLUS test strip Generic drug:  glucose blood USE AS DIRECTED TO CHECK BLOOD SUGAR ONCE A DAY   ACCU-CHEK SOFTCLIX LANCETS lancets USE AS DIRECTED EVERY DAY   acetaminophen 500 MG tablet Commonly known as:  TYLENOL Take 1 tablet (500 mg total) by mouth every 6 (six) hours as needed.   atenolol-chlorthalidone 50-25 MG tablet Commonly known as:  TENORETIC Take 1 tablet by mouth daily.   calcium citrate-vitamin D 315-200 MG-UNIT tablet Commonly known as:  CITRACAL+D Take 1 tablet by mouth daily.   cholecalciferol 1000 units tablet Commonly known as:  VITAMIN D Take 2,000 Units by mouth daily.   diclofenac sodium 1 % Gel Commonly known as:  VOLTAREN Apply 2 g topically 4 (four) times daily.   glimepiride 4 MG tablet Commonly known as:  AMARYL TAKE 1 TABLET EVERY DAY AT DINNER AND 1/2 TABLET AT BEDTIME   loratadine 10 MG tablet Commonly known as:  CLARITIN Take 1 tablet (10 mg total) by mouth 2 (two) times daily as needed for allergies.   metFORMIN 500 MG 24 hr tablet Commonly known as:  GLUCOPHAGE-XR TAKE 2 TABLETS EVERY DAY WITH SUPPER   omeprazole 20 MG capsule Commonly known as:  PRILOSEC Take 20 mg by mouth daily.   SE-TAN PLUS 162-115.2-1 MG Caps TAKE 1 CAPSULE BY MOUTH EVERY DAY   spironolactone 50 MG tablet Commonly known as:  ALDACTONE TAKE 1 TABLET BY MOUTH EVERY DAY       Allergies:  Allergies  Allergen Reactions  . Aspirin     REACTION: nervous  . Beef-Derived Products     Sneezing, nasal drainage, throat scratchy  . Celecoxib     REACTION: joints swell  . Milk-Related Compounds     itching  . Mold Extract [Trichophyton]     Pt does not know the reaction.  Positive allergy test per pt.  . Nsaids     REACTION:  sweling  . Peanuts [Peanut Oil]     Throat feels scratchy  . Shellfish Allergy     Nasal congestion, throat scratchy, eyes watery  . Sulfamethoxazole     REACTION: urticaria (  hives)  . Vioxx [Rofecoxib] Swelling    Past Medical History:  Diagnosis Date  . Allergy   . Anemia    as a young adult  . Arthritis   . Diabetes mellitus without complication ([place])   . Hyperlipidemia    was normal range 05/2017 with PCP  . Hypertension     Past Surgical History:  Procedure Laterality Date  . ABDOMINAL HYSTERECTOMY  1994  . COLONOSCOPY  2008  . FLEXIBLE SIGMOIDOSCOPY  2003  . POLYPECTOMY      Family History  Problem Relation Age of Onset  . Hypertension Sister   . Stroke Unknown   . Diabetes Father   . Hypertension Father   . Stroke Father   . Diabetes Sister   . Diabetes Brother   . Hypertension Brother   . Stroke Brother   . Esophageal cancer Neg Hx   . Rectal cancer Neg Hx   . Stomach cancer Neg Hx   . Colon cancer Neg Hx     Social History:  reports that she has never smoked. She has never used smokeless tobacco. She reports that she does not drink alcohol or use drugs.    Review of Systems    HYPERTENSION: She takes a half a tablet of atenolol/chlorthalidone and followed by PCP  HYPOKALEMIA: Likely from HCTZ, managed with Aldactone This is controlled with taking Aldactone 50 mg although potassium is now 4.9  Lab Results  Component Value Date   CREATININE 1.22 (H) 12/09/2018   BUN 31 (H) 12/09/2018   NA 134 (L) 12/09/2018   K 4.9 12/09/2018   CL 96 12/09/2018   CO2 28 12/09/2018    Lipids: She did not take pravastatin as prescribed because of cost She was recommended taking simvastatin in 7/19 and not clear whether she is taking this or not She thinks she was given her medication for her heart last year by myself but no other medication is listed She thinks this was causing a pulling sensation in her right shoulder and also some foot pain and itching and  she stopped it and is convinced that she was having side effects  Currently not on treatment  LDL is significantly high as of 1/29 Discussed LDL targets of under 100 and what her levels are currently  She does have family history of CVA but no known history of CAD       Lab Results  Component Value Date   CHOL 212 (H) 12/09/2018   HDL 48.00 12/09/2018   LDLCALC 143 (H) 12/09/2018   LDLDIRECT 130.0 05/17/2016   TRIG 109.0 12/09/2018   CHOLHDL 4 12/09/2018    Last foot exam was in 12/2018, she does not complain of any  symptoms of neuropathy   Physical Examination:  BP 122/74 (BP Location: Left Arm, Patient Position: Sitting, Cuff Size: Normal)   Pulse 85   Ht 5' 1.5" (1.562 m)   Wt 149 lb (67.6 kg)   SpO2 98%   BMI 27.70 kg/m      Diabetic Foot Exam - Simple   Simple Foot Form Diabetic Foot exam was performed with the following findings:  Yes 12/22/2018  3:28 PM  Visual Inspection No deformities, no ulcerations, no other skin breakdown bilaterally:  Yes Sensation Testing Intact to touch and monofilament testing bilaterally:  Yes Pulse Check Posterior Tibialis and Dorsalis pulse intact bilaterally:  Yes Comments        ASSESSMENT:  Diabetes type 2 with BMI now 27  See  history of present illness for detailed discussion of  current management, blood sugar patterns and problems identified  A1c is consistently controlled 6.5 She is continuing to watch her diet well and has lost weight Also tolerating metformin twice a day along with her Amaryl and no hypoglycemia However she has not monitored her blood sugar and still not motivated to check at home    Hypertension: Blood pressure is normal, will continue to monitor and she will follow-up with PCP also Potassium 4.9 and is on 50 mg Aldactone  LIPIDS: She was reassured that Zocor does not cause a nonspecific musculoskeletal symptoms that she was having Explained the need for cardiovascular risk reduction Also  explained to her what her lipids are currently   LDL will be checked on her next visit  PLAN:    She will start checking her blood sugars at least every other day  Discussed blood sugar targets in the morning and after meals  No change in medications  For her hypertension she will cut her Aldactone in half because of her hypotension  Try to take half tablet of simvastatin daily  Follow-up in 3-4 months    Patient Instructions  Check blood sugars on waking up 3 days a week  Also check blood sugars about 2 hours after meals and do this after different meals by rotation  Recommended blood sugar levels on waking up are 90-130 and about 2 hours after meal is 130-160  Please bring your blood sugar monitor to each visit, thank you  Take Simvastatin 1/2 daily for cholesterol and call if needing new Rx  [place] 1/[address] [place] 12/23/2018, 10:44 AM   Note: This office note was prepared with Dragon voice recognition system technology. Any transcriptional errors that result from this process are unintentional.

## 2019-01-04 ENCOUNTER — Other Ambulatory Visit: Payer: Self-pay

## 2019-01-04 ENCOUNTER — Telehealth: Payer: Self-pay

## 2019-01-04 MED ORDER — GLUCOSE BLOOD VI STRP: ORAL_STRIP | 1 refills | Status: DC

## 2019-01-04 MED ORDER — ACCU-CHEK SOFTCLIX LANCETS MISC: 1 refills | Status: DC

## 2019-01-04 NOTE — Telephone Encounter (Signed)
Pt called and stated that during her last visit, Dr. Dwyane Dee had questioned whether she was taking Simvastatin, and pt called back to inform him that she is taking this.

## 2019-01-06 ENCOUNTER — Other Ambulatory Visit: Payer: Self-pay

## 2019-01-06 MED ORDER — SIMVASTATIN 40 MG PO TABS: ORAL_TABLET | ORAL | 3 refills | Status: DC

## 2019-01-06 NOTE — Telephone Encounter (Signed)
She was given simvastatin 20 mg daily, need to increase this to 40 mg daily since her cholesterol is still very high, new prescription may need to be sent

## 2019-01-06 NOTE — Telephone Encounter (Signed)
Called pt and informed her of medication change. Pt verbalized understanding

## 2019-04-20 ENCOUNTER — Other Ambulatory Visit (INDEPENDENT_AMBULATORY_CARE_PROVIDER_SITE_OTHER): Payer: Medicare Other

## 2019-04-20 ENCOUNTER — Other Ambulatory Visit: Payer: Self-pay

## 2019-04-20 DIAGNOSIS — E7800 Pure hypercholesterolemia, unspecified: Secondary | ICD-10-CM | POA: Diagnosis not present

## 2019-04-20 DIAGNOSIS — E1165 Type 2 diabetes mellitus with hyperglycemia: Secondary | ICD-10-CM | POA: Diagnosis not present

## 2019-04-20 LAB — LIPID PANEL
Cholesterol: 203 mg/dL — ABNORMAL HIGH (ref 0–200)
HDL: 49.6 mg/dL (ref >39.00)
LDL Cholesterol: 129 mg/dL — ABNORMAL HIGH (ref 0–99)
NonHDL: 153.7
Total CHOL/HDL Ratio: 4
Triglycerides: 125 mg/dL (ref 0.0–149.0)
VLDL: 25 mg/dL (ref 0.0–40.0)

## 2019-04-20 LAB — URINALYSIS, ROUTINE W REFLEX MICROSCOPIC
Bilirubin Urine: NEGATIVE
Hgb urine dipstick: NEGATIVE
Nitrite: NEGATIVE
RBC / HPF: NONE SEEN (ref 0–?)
Specific Gravity, Urine: 1.01 (ref 1.000–1.030)
Total Protein, Urine: NEGATIVE
Urine Glucose: NEGATIVE
Urobilinogen, UA: 0.2 (ref 0.0–1.0)
pH: 6 (ref 5.0–8.0)

## 2019-04-20 LAB — COMPREHENSIVE METABOLIC PANEL
ALT: 13 U/L (ref 0–35)
AST: 17 U/L (ref 0–37)
Albumin: 4.5 g/dL (ref 3.5–5.2)
Alkaline Phosphatase: 71 U/L (ref 39–117)
BUN: 24 mg/dL — ABNORMAL HIGH (ref 6–23)
CO2: 28 mEq/L (ref 19–32)
Calcium: 9.4 mg/dL (ref 8.4–10.5)
Chloride: 100 mEq/L (ref 96–112)
Creatinine, Ser: 1.2 mg/dL (ref 0.40–1.20)
GFR: 53.08 mL/min — ABNORMAL LOW (ref >60.00)
Glucose, Bld: 115 mg/dL — ABNORMAL HIGH (ref 70–99)
Potassium: 4.4 mEq/L (ref 3.5–5.1)
Sodium: 137 mEq/L (ref 135–145)
Total Bilirubin: 0.5 mg/dL (ref 0.2–1.2)
Total Protein: 7.7 g/dL (ref 6.0–8.3)

## 2019-04-20 LAB — MICROALBUMIN / CREATININE URINE RATIO
Creatinine,U: 152.8 mg/dL
Microalb Creat Ratio: 0.5 mg/g (ref 0.0–30.0)
Microalb, Ur: <0.7 mg/dL (ref 0.0–1.9)

## 2019-04-20 LAB — HEMOGLOBIN A1C: Hgb A1c MFr Bld: 7 % — ABNORMAL HIGH (ref 4.6–6.5)

## 2019-04-22 ENCOUNTER — Encounter: Payer: Self-pay | Admitting: Endocrinology

## 2019-04-22 ENCOUNTER — Other Ambulatory Visit: Payer: Self-pay

## 2019-04-22 ENCOUNTER — Other Ambulatory Visit: Payer: Self-pay | Admitting: Endocrinology

## 2019-04-22 ENCOUNTER — Ambulatory Visit (INDEPENDENT_AMBULATORY_CARE_PROVIDER_SITE_OTHER): Payer: Medicare Other | Admitting: Endocrinology

## 2019-04-22 DIAGNOSIS — E1165 Type 2 diabetes mellitus with hyperglycemia: Secondary | ICD-10-CM

## 2019-04-22 DIAGNOSIS — E7800 Pure hypercholesterolemia, unspecified: Secondary | ICD-10-CM

## 2019-04-22 MED ORDER — SIMVASTATIN 20 MG PO TABS: ORAL_TABLET | ORAL | 3 refills | Status: DC

## 2019-04-22 NOTE — Progress Notes (Signed)
j         Patient ID: Pam Obrien, female   DOB: [birth year], 73 y.o.   MRN: [id number]  Today's office visit was provided via telemedicine using a telephone call to the patient Patient has been explained the limitations of evaluation and management by telemedicine and the availability of in person appointments.  The patient understood the limitations and agreed to proceed. Patient also understood that the telehealth visit is billable. . Location of the patient: Home . Location of the provider: Office Only the patient and myself were participating in the encounter   Reason for Appointment: Followup for Type 2 Diabetes   History of Present Illness:          Diagnosis: Type 2 diabetes mellitus, date of diagnosis:  4/13      Past history:  She was initially started on metformin but he thinks that this made her sleepy and had some abdominal discomfort   She also has been tried on other oral hypoglycemic regimens including Jentadueto  since 2014  But she had similar side effects with this  And not clear how regularly she had been taking this until it was discontinued in 5/15  A1c appears to be persistently over 8% Since 2014  She was tried on Invokamet by her PCP but because of excessive urination with this and also some nausea and weakness it was stopped With glyburide alone her blood sugars are poorly controlled and she was referred here for further management She was given Januvia but she did not take this because of expense of the medication   She was referred to the nurse educator and started on basal insulin to improve her control on 06/07/14 This was changed to premixed insulin in 9/15 because of continued poor control and her not being able to afford NovoLog mix insulin or Invokana  She stopped taking insulin in 1/16 because of the cost and was taking only Amaryl.  Recent history:   Hypoglycemic drugs: Glimepiride 4 mg at supper, metformin ER 500 mg in a.m. and pm    Her A1c  is slightly higher at 7%, previously at 6.5, was last seen in the office in 12/2018  Current management, blood sugar patterns and problems identified:  She does not know why her blood sugars are higher, on her last visit she had not been checking her sugars  Not clear how to explain her higher A1c since recent blood sugars are near normal and lab glucose was 115 midday  However she has generally been less active and not doing any of her walking  May not be consistent with diet also but being more at home  She has not checked her weight  No hypoglycemic symptoms with Amaryl  Glucose monitoring with Accu-Chek meter readings obtained from patient at home:    PRE-MEAL Fasting Lunch Dinner Bedtime Overall  Glucose range:  99, 120  104-120   102-142   Mean/median:     ?    Side effects from medications have been: Metformin higher doses causes abdominal discomfort Compliance with the medical regimen: fair  Self-care: The diet that the patient has been following is: tries to limit  portions      Meals:  usually 2-3 meals per day. Usually has variable intake in the morning, sometimes toast/fruit, usually has carbohydrates at dinner time, will have fruit for snacks           Exercise: Currently not walking       Dietician  visit: Most recent: 6/17 .              CDE visit last in 7/15  Wt Readings from Last 3 Encounters:  12/22/18 149 lb (67.6 kg)  06/17/18 159 lb (72.1 kg)  05/27/18 158 lb (71.7 kg)       Glycemic control:   Lab Results  Component Value Date   HGBA1C 7.0 (H) 04/20/2019   HGBA1C 6.5 12/09/2018   HGBA1C 6.6 (A) 05/27/2018   Lab Results  Component Value Date   MICROALBUR <0.7 04/20/2019   LDLCALC 129 (H) 04/20/2019   CREATININE 1.20 04/20/2019    Lab on 04/20/2019  Component Date Value Ref Range Status  . Cholesterol 04/20/2019 203* 0 - 200 mg/dL Final   ATP III Classification       Desirable:  < 200 mg/dL               Borderline High:  200 - 239 mg/dL           High:  > = 240 mg/dL  . Triglycerides 04/20/2019 125.0  0.0 - 149.0 mg/dL Final   Normal:  <150 mg/dLBorderline High:  150 - 199 mg/dL  . HDL 04/20/2019 49.60  >39.00 mg/dL Final  . VLDL 04/20/2019 25.0  0.0 - 40.0 mg/dL Final  . LDL Cholesterol 04/20/2019 129* 0 - 99 mg/dL Final  . Total CHOL/HDL Ratio 04/20/2019 4   Final                  Men          Women1/2 Average Risk     3.4          3.3Average Risk          5.0          4.42X Average Risk          9.6          7.13X Average Risk          15.0          11.0                      . NonHDL 04/20/2019 153.70   Final   NOTE:  Non-HDL goal should be 30 mg/dL higher than patient's LDL goal (i.e. LDL goal of < 70 mg/dL, would have non-HDL goal of < 100 mg/dL)  . Color, Urine 04/20/2019 YELLOW  Yellow;Lt. Yellow;Straw;Dark Yellow;Amber;Green;Red;Brown Final  . APPearance 04/20/2019 CLEAR  Clear;Turbid;Slightly Cloudy;Cloudy Final  . Specific Gravity, Urine 04/20/2019 1.010  1.000 - 1.030 Final  . pH 04/20/2019 6.0  5.0 - 8.0 Final  . Total Protein, Urine 04/20/2019 NEGATIVE  Negative Final  . Urine Glucose 04/20/2019 NEGATIVE  Negative Final  . Ketones, ur 04/20/2019 TRACE* Negative Final  . Bilirubin Urine 04/20/2019 NEGATIVE  Negative Final  . Hgb urine dipstick 04/20/2019 NEGATIVE  Negative Final  . Urobilinogen, UA 04/20/2019 0.2  0.0 - 1.0 Final  . Leukocytes,Ua 04/20/2019 TRACE* Negative Final  . Nitrite 04/20/2019 NEGATIVE  Negative Final  . WBC, UA 04/20/2019 0-2/hpf  0-2/hpf Final  . RBC / HPF 04/20/2019 none seen  0-2/hpf Final  . Squamous Epithelial / LPF 04/20/2019 Rare(0-4/hpf)  Rare(0-4/hpf) Final  . Microalb, Ur 04/20/2019 <0.7  0.0 - 1.9 mg/dL Final  . Creatinine,U 04/20/2019 152.8  mg/dL Final  . Microalb Creat Ratio 04/20/2019 0.5  0.0 - 30.0 mg/g Final  . Sodium 04/20/2019 137  135 - 145  mEq/L Final  . Potassium 04/20/2019 4.4  3.5 - 5.1 mEq/L Final  . Chloride 04/20/2019 100  96 - 112 mEq/L Final  . CO2  04/20/2019 28  19 - 32 mEq/L Final  . Glucose, Bld 04/20/2019 115* 70 - 99 mg/dL Final  . BUN 04/20/2019 24* 6 - 23 mg/dL Final  . Creatinine, Ser 04/20/2019 1.20  0.40 - 1.20 mg/dL Final  . Total Bilirubin 04/20/2019 0.5  0.2 - 1.2 mg/dL Final  . Alkaline Phosphatase 04/20/2019 71  39 - 117 U/L Final  . AST 04/20/2019 17  0 - 37 U/L Final  . ALT 04/20/2019 13  0 - 35 U/L Final  . Total Protein 04/20/2019 7.7  6.0 - 8.3 g/dL Final  . Albumin 04/20/2019 4.5  3.5 - 5.2 g/dL Final  . Calcium 04/20/2019 9.4  8.4 - 10.5 mg/dL Final  . GFR 04/20/2019 53.08* >60.00 mL/min Final  . Hgb A1c MFr Bld 04/20/2019 7.0* 4.6 - 6.5 % Final   Glycemic Control Guidelines for People with Diabetes:Non Diabetic:  <6%Goal of Therapy: <7%Additional Action Suggested:  >8%      Weight history:  Previous range upto 172   Wt Readings from Last 3 Encounters:  12/22/18 149 lb (67.6 kg)  06/17/18 159 lb (72.1 kg)  05/27/18 158 lb (71.7 kg)    Allergies as of 04/22/2019      Reactions   Aspirin    REACTION: nervous   Beef-derived Products    Sneezing, nasal drainage, throat scratchy   Celecoxib    REACTION: joints swell   Milk-related Compounds    itching   Mold Extract [trichophyton]    Pt does not know the reaction.  Positive allergy test per pt.   Nsaids    REACTION: sweling   Peanuts [peanut Oil]    Throat feels scratchy   Shellfish Allergy    Nasal congestion, throat scratchy, eyes watery   Sulfamethoxazole    REACTION: urticaria (hives)   Vioxx [rofecoxib] Swelling      Medication List       Accurate as of April 22, 2019  8:55 PM. If you have any questions, ask your nurse or doctor.        Accu-Chek [employer] Kit by Does not apply route. USE LANCET TO CHECK BLOOD SUGAR ONCE DAILY.   Accu-Chek Softclix Lancets lancets USE AS DIRECTED EVERY DAY   acetaminophen 500 MG tablet Commonly known as: TYLENOL Take 1 tablet (500 mg total) by mouth every 6 (six) hours as needed.    atenolol-chlorthalidone 50-25 MG tablet Commonly known as: TENORETIC Take 1 tablet by mouth daily.   calcium citrate-vitamin D 315-200 MG-UNIT tablet Commonly known as: CITRACAL+D Take 1 tablet by mouth daily.   cholecalciferol 1000 units tablet Commonly known as: VITAMIN D Take 2,000 Units by mouth daily.   diclofenac sodium 1 % Gel Commonly known as: VOLTAREN Apply 2 g topically 4 (four) times daily.   glimepiride 4 MG tablet Commonly known as: AMARYL TAKE 1 TABLET EVERY DAY AT DINNER AND 1/2 TABLET AT BEDTIME   glucose blood test strip Commonly known as: Accu-Chek Guide Use as instructed to check blood sugar once daily.   loratadine 10 MG tablet Commonly known as: CLARITIN Take 1 tablet (10 mg total) by mouth 2 (two) times daily as needed for allergies.   metFORMIN 500 MG 24 hr tablet Commonly known as: GLUCOPHAGE-XR TAKE 2 TABLETS EVERY DAY WITH SUPPER   omeprazole 20 MG capsule Commonly known  as: PRILOSEC Take 20 mg by mouth daily.   Se-Tan PLUS 162-115.2-1 MG Caps TAKE 1 CAPSULE BY MOUTH EVERY DAY   simvastatin 20 MG tablet Commonly known as: ZOCOR TAKE 1 TABLET BY MOUTH ONCE DAILY. What changed: medication strength Changed by: Elayne Snare, MD   spironolactone 50 MG tablet Commonly known as: ALDACTONE TAKE 1 TABLET BY MOUTH EVERY DAY       Allergies:  Allergies  Allergen Reactions  . Aspirin     REACTION: nervous  . Beef-Derived Products     Sneezing, nasal drainage, throat scratchy  . Celecoxib     REACTION: joints swell  . Milk-Related Compounds     itching  . Mold Extract [Trichophyton]     Pt does not know the reaction.  Positive allergy test per pt.  . Nsaids     REACTION: sweling  . Peanuts [Peanut Oil]     Throat feels scratchy  . Shellfish Allergy     Nasal congestion, throat scratchy, eyes watery  . Sulfamethoxazole     REACTION: urticaria (hives)  . Vioxx [Rofecoxib] Swelling    Past Medical History:  Diagnosis Date  .  Allergy   . Anemia    as a young adult  . Arthritis   . Diabetes mellitus without complication ([place])   . Hyperlipidemia    was normal range 05/2017 with PCP  . Hypertension     Past Surgical History:  Procedure Laterality Date  . ABDOMINAL HYSTERECTOMY  1994  . COLONOSCOPY  2008  . FLEXIBLE SIGMOIDOSCOPY  2003  . POLYPECTOMY      Family History  Problem Relation Age of Onset  . Hypertension Sister   . Stroke Unknown   . Diabetes Father   . Hypertension Father   . Stroke Father   . Diabetes Sister   . Diabetes Brother   . Hypertension Brother   . Stroke Brother   . Esophageal cancer Neg Hx   . Rectal cancer Neg Hx   . Stomach cancer Neg Hx   . Colon cancer Neg Hx     Social History:  reports that she has never smoked. She has never used smokeless tobacco. She reports that she does not drink alcohol or use drugs.    Review of Systems    HYPERTENSION: She takes a half a tablet of atenolol/chlorthalidone and followed by PCP Does not check blood pressure at home  HYPOKALEMIA: Previously from HCTZ, managed with Aldactone This is controlled with taking Aldactone 50 mg  Lab Results  Component Value Date   CREATININE 1.20 04/20/2019   BUN 24 (H) 04/20/2019   NA 137 04/20/2019   K 4.4 04/20/2019   CL 100 04/20/2019   CO2 28 04/20/2019    Lipids: She did not take pravastatin as prescribed because of cost She was recommended taking simvastatin in 7/19 and again on her last visit in 2/20  Again not clear why she is not taking her simvastatin, apparently did not pick it up from the pharmacy Lipids are not controlled as before although slightly better  She does have family history of CVA but no known history of CAD    Lab Results  Component Value Date   CHOL 203 (H) 04/20/2019   CHOL 212 (H) 12/09/2018   CHOL 228 (H) 06/17/2018   Lab Results  Component Value Date   HDL 49.60 04/20/2019   HDL 48.00 12/09/2018   HDL 49.00 06/17/2018   Lab Results   Component Value Date  LDLCALC 129 (H) 04/20/2019   LDLCALC 143 (H) 12/09/2018   LDLCALC 152 (H) 06/17/2018   Lab Results  Component Value Date   TRIG 125.0 04/20/2019   TRIG 109.0 12/09/2018   TRIG 138.0 06/17/2018   Lab Results  Component Value Date   CHOLHDL 4 04/20/2019   CHOLHDL 4 12/09/2018   CHOLHDL 5 06/17/2018   Lab Results  Component Value Date   LDLDIRECT 130.0 05/17/2016   LDLDIRECT 121.7 03/04/2012     Last foot exam was in 12/2018, she does not complain of any  symptoms of neuropathy   Physical Examination:  There were no vitals taken for this visit.      ASSESSMENT:  Diabetes type 2   See history of present illness for detailed discussion of  current management, blood sugar patterns and problems identified  A1c is slightly higher at 7.5  Most likely she has not watched her diet with a recent lockdown Also has not been wanting to go out to do any walking for exercise which she was previously doing Recently blood sugars are fairly good at home but she is checking mostly before meals and bedtime and may be having high readings after meals  Hypertension: Blood pressure needs to be checked in the office Potassium is normal again with Aldactone  Currently normal microalbumin  LIPIDS: She was reassured that Zocor does not cause a nonspecific musculoskeletal symptoms that she was having Explained the need for cardiovascular risk reduction Also explained to her what her lipids are currently   LDL will be checked on her next visit  PLAN:    She will start checking her blood sugars about 2 hours after her meals  Since she cannot increase her Amaryl and metformin she may be a candidate for Actos but will have her try being consistently compliant with her diet and exercise first  She agrees to start walking outside  Also reminded her to start taking Zocor for her lipids and high cardiovascular risk, new prescription for 20 mg has been sent  Follow-up in 3 months   There are no Patient Instructions on file for this visit.    Duration of telephone encounter =6 minutes   Elayne Snare 04/22/2019, 8:55 PM   Note: This office note was prepared with Dragon voice recognition system technology. Any transcriptional errors that result from this process are unintentional.

## 2019-04-28 DIAGNOSIS — H2513 Age-related nuclear cataract, bilateral: Secondary | ICD-10-CM | POA: Diagnosis not present

## 2019-04-28 DIAGNOSIS — H25813 Combined forms of age-related cataract, bilateral: Secondary | ICD-10-CM | POA: Diagnosis not present

## 2019-04-28 DIAGNOSIS — H1045 Other chronic allergic conjunctivitis: Secondary | ICD-10-CM | POA: Diagnosis not present

## 2019-04-28 DIAGNOSIS — E119 Type 2 diabetes mellitus without complications: Secondary | ICD-10-CM | POA: Diagnosis not present

## 2019-04-28 DIAGNOSIS — H5203 Hypermetropia, bilateral: Secondary | ICD-10-CM | POA: Diagnosis not present

## 2019-04-28 DIAGNOSIS — H52223 Regular astigmatism, bilateral: Secondary | ICD-10-CM | POA: Diagnosis not present

## 2019-04-28 DIAGNOSIS — H524 Presbyopia: Secondary | ICD-10-CM | POA: Diagnosis not present

## 2019-04-28 LAB — HM DIABETES EYE EXAM

## 2019-06-01 ENCOUNTER — Telehealth: Payer: Self-pay | Admitting: Endocrinology

## 2019-06-01 ENCOUNTER — Other Ambulatory Visit: Payer: Self-pay | Admitting: Family Medicine

## 2019-06-01 DIAGNOSIS — I10 Essential (primary) hypertension: Secondary | ICD-10-CM

## 2019-06-01 NOTE — Telephone Encounter (Signed)
Patient requests to be called at ph# [phone] re: Pharmacy told patient Dr. Dwyane Dee did not give permission for RX for atenolol-chlorthalidone (TENORETIC) 50-25 MG tablet to be filled. Patient wants to know why.

## 2019-06-02 NOTE — Telephone Encounter (Signed)
Called pt and informed her that per last office visit note, her HTN is followed by her PCP and she should call him for a refill. Pt verbalized understanding.

## 2019-06-22 ENCOUNTER — Ambulatory Visit (INDEPENDENT_AMBULATORY_CARE_PROVIDER_SITE_OTHER): Payer: Medicare Other | Admitting: Adult Health

## 2019-06-22 ENCOUNTER — Encounter: Payer: Self-pay | Admitting: Adult Health

## 2019-06-22 ENCOUNTER — Other Ambulatory Visit: Payer: Self-pay

## 2019-06-22 VITALS — BP 130/80 | Temp 98.3°F | Ht 62.0 in | Wt 153.0 lb

## 2019-06-22 DIAGNOSIS — I10 Essential (primary) hypertension: Secondary | ICD-10-CM | POA: Diagnosis not present

## 2019-06-22 DIAGNOSIS — E785 Hyperlipidemia, unspecified: Secondary | ICD-10-CM

## 2019-06-22 DIAGNOSIS — E1165 Type 2 diabetes mellitus with hyperglycemia: Secondary | ICD-10-CM | POA: Diagnosis not present

## 2019-06-22 LAB — CBC WITH DIFFERENTIAL/PLATELET
Basophils Absolute: 0 10*3/uL (ref 0.0–0.1)
Basophils Relative: 0.3 % (ref 0.0–3.0)
Eosinophils Absolute: 0.2 10*3/uL (ref 0.0–0.7)
Eosinophils Relative: 2 % (ref 0.0–5.0)
HCT: 34.8 % — ABNORMAL LOW (ref 36.0–46.0)
Hemoglobin: 11.3 g/dL — ABNORMAL LOW (ref 12.0–15.0)
Lymphocytes Relative: 35 % (ref 12.0–46.0)
Lymphs Abs: 2.7 10*3/uL (ref 0.7–4.0)
MCHC: 32.4 g/dL (ref 30.0–36.0)
MCV: 84.1 fl (ref 78.0–100.0)
Monocytes Absolute: 0.6 10*3/uL (ref 0.1–1.0)
Monocytes Relative: 8.2 % (ref 3.0–12.0)
Neutro Abs: 4.2 10*3/uL (ref 1.4–7.7)
Neutrophils Relative %: 54.5 % (ref 43.0–77.0)
Platelets: 247 10*3/uL (ref 150.0–400.0)
RBC: 4.14 Mil/uL (ref 3.87–5.11)
RDW: 12.9 % (ref 11.5–15.5)
WBC: 7.8 10*3/uL (ref 4.0–10.5)

## 2019-06-22 LAB — BASIC METABOLIC PANEL
BUN: 18 mg/dL (ref 6–23)
CO2: 25 mEq/L (ref 19–32)
Calcium: 9.7 mg/dL (ref 8.4–10.5)
Chloride: 102 mEq/L (ref 96–112)
Creatinine, Ser: 1 mg/dL (ref 0.40–1.20)
GFR: 65.48 mL/min (ref >60.00)
Glucose, Bld: 122 mg/dL — ABNORMAL HIGH (ref 70–99)
Potassium: 4.7 mEq/L (ref 3.5–5.1)
Sodium: 140 mEq/L (ref 135–145)

## 2019-06-22 LAB — LIPID PANEL
Cholesterol: 207 mg/dL — ABNORMAL HIGH (ref 0–200)
HDL: 54.2 mg/dL (ref >39.00)
LDL Cholesterol: 136 mg/dL — ABNORMAL HIGH (ref 0–99)
NonHDL: 153.17
Total CHOL/HDL Ratio: 4
Triglycerides: 87 mg/dL (ref 0.0–149.0)
VLDL: 17.4 mg/dL (ref 0.0–40.0)

## 2019-06-22 LAB — TSH: TSH: 4.43 u[IU]/mL (ref 0.35–4.50)

## 2019-06-22 MED ORDER — ATENOLOL-CHLORTHALIDONE 50-25 MG PO TABS: 0.5000 | ORAL_TABLET | Freq: Every day | ORAL | 3 refills | Status: DC

## 2019-06-22 NOTE — Patient Instructions (Signed)
It was great seeing you today   I have sent in your blood pressure medication  We will follow up with you regarding your blood work   Please follow up with Dr Dwyane Dee as directed

## 2019-06-22 NOTE — Progress Notes (Signed)
Subjective:    Patient ID: Pam Obrien, female    DOB: [birth year], 73 y.o.   MRN: [id number]  HPI Patient presents for yearly preventative medicine examination. She is a pleasant 73 year old female who  has a past medical history of Allergy, Anemia, Arthritis, Diabetes mellitus without complication ([place]), Hyperlipidemia, and Hypertension.  DM -he is followed by endocrinology.  She has been managed with metformin 500 mg extended release ( two tabs at dinner), and amaryl 4 mg.  Lab Results  Component Value Date   HGBA1C 7.0 (H) 04/20/2019   Essential Hypertension -controlled with one half tab of atenolol chlorthalidone.  She denies dizziness, lightheadedness, or syncopal episodes.  BP Readings from Last 3 Encounters:  06/22/19 130/80  12/22/18 122/74  07/30/18 132/72    Hyperlipidemia -prescribed simvastatin by endocrinology. She reports that she has not been taking this medication at all since it was prescribed.   Lab Results  Component Value Date   CHOL 203 (H) 04/20/2019   HDL 49.60 04/20/2019   LDLCALC 129 (H) 04/20/2019   LDLDIRECT 130.0 05/17/2016   TRIG 125.0 04/20/2019   CHOLHDL 4 04/20/2019   GERD - controlled with Prilosec   All immunizations and health maintenance protocols were reviewed with the patient and needed orders were placed.  Appropriate screening laboratory values were ordered for the patient including screening of hyperlipidemia, renal function and hepatic function.   Medication reconciliation,  past medical history, social history, problem list and allergies were reviewed in detail with the patient  Goals were established with regard to weight loss, exercise, and  diet in compliance with medications. She is eating a fairly healthy diet and is staying active   Wt Readings from Last 3 Encounters:  06/22/19 153 lb (69.4 kg)  12/22/18 149 lb (67.6 kg)  06/17/18 159 lb (72.1 kg)   End of life planning was discussed.  She is up-to-date on  screening mammogram and colonoscopy  She has no acute complaints.   Review of Systems  Constitutional: Negative.   HENT: Negative.   Eyes: Negative.   Respiratory: Negative.   Cardiovascular: Negative.   Gastrointestinal: Negative.   Endocrine: Negative.   Genitourinary: Negative.   Musculoskeletal: Negative.   Skin: Negative.   Allergic/Immunologic: Negative.   Neurological: Negative.   Hematological: Negative.   Psychiatric/Behavioral: Negative.    Past Medical History:  Diagnosis Date  . Allergy   . Anemia    as a young adult  . Arthritis   . Diabetes mellitus without complication ([place])   . Hyperlipidemia    was normal range 05/2017 with PCP  . Hypertension     Social History   Socioeconomic History  . Marital status: Married    Spouse name: Not on file  . Number of children: Not on file  . Years of education: Not on file  . Highest education level: Not on file  Occupational History  . Not on file  Social Needs  . Financial resource strain: Not on file  . Food insecurity    Worry: Not on file    Inability: Not on file  . Transportation needs    Medical: Not on file    Non-medical: Not on file  Tobacco Use  . Smoking status: Never Smoker  . Smokeless tobacco: Never Used  Substance and Sexual Activity  . Alcohol use: No  . Drug use: No  . Sexual activity: Not on file  Lifestyle  . Physical activity    Days  per week: Not on file    Minutes per session: Not on file  . Stress: Not on file  Relationships  . Social Herbalist on phone: Not on file    Gets together: Not on file    Attends religious service: Not on file    Active member of club or organization: Not on file    Attends meetings of clubs or organizations: Not on file    Relationship status: Not on file  . Intimate partner violence    Fear of current or ex partner: Not on file    Emotionally abused: Not on file    Physically abused: Not on file    Forced sexual activity: Not on  file  Other Topics Concern  . Not on file  Social History Narrative   Retired from [employer] work    Separated for 9 years.    Five children, two live locally, three are in [place]   One of her daughters live with her   Has a dog.    She likes to dance and walk    Past Surgical History:  Procedure Laterality Date  . ABDOMINAL HYSTERECTOMY  1994  . COLONOSCOPY  2008  . FLEXIBLE SIGMOIDOSCOPY  2003  . POLYPECTOMY      Family History  Problem Relation Age of Onset  . Hypertension Sister   . Stroke Unknown   . Diabetes Father   . Hypertension Father   . Stroke Father   . Diabetes Sister   . Diabetes Brother   . Hypertension Brother   . Stroke Brother   . Esophageal cancer Neg Hx   . Rectal cancer Neg Hx   . Stomach cancer Neg Hx   . Colon cancer Neg Hx     Allergies  Allergen Reactions  . Aspirin     REACTION: nervous  . Beef-Derived Products     Sneezing, nasal drainage, throat scratchy  . Celecoxib     REACTION: joints swell  . Milk-Related Compounds     itching  . Mold Extract [Trichophyton]     Pt does not know the reaction.  Positive allergy test per pt.  . Nsaids     REACTION: sweling  . Peanuts [Peanut Oil]     Throat feels scratchy  . Shellfish Allergy     Nasal congestion, throat scratchy, eyes watery  . Sulfamethoxazole     REACTION: urticaria (hives)  . Vioxx [Rofecoxib] Swelling    Current Outpatient Medications on File Prior to Visit  Medication Sig Dispense Refill  . ACCU-CHEK SOFTCLIX LANCETS lancets USE AS DIRECTED EVERY DAY 100 each 1  . acetaminophen (TYLENOL) 500 MG tablet Take 1 tablet (500 mg total) by mouth every 6 (six) hours as needed. 30 tablet 0  . atenolol-chlorthalidone (TENORETIC) 50-25 MG tablet Take 1 tablet by mouth daily. 90 tablet 3  . calcium citrate-vitamin D (CITRACAL+D) 315-200 MG-UNIT per tablet Take 1 tablet by mouth daily.    . cholecalciferol (VITAMIN D) 1000 UNITS tablet Take 2,000 Units by mouth daily.    .  diclofenac sodium (VOLTAREN) 1 % GEL Apply 2 g topically 4 (four) times daily. 1 Tube 0  . FeFum-FePo-FA-B Cmp-C-Zn-Mn-Cu (SE-TAN PLUS) 162-115.2-1 MG CAPS TAKE 1 CAPSULE BY MOUTH EVERY DAY 90 capsule 3  . glimepiride (AMARYL) 4 MG tablet TAKE 1 TABLET EVERY DAY AT DINNER AND 1/2 TABLET AT BEDTIME 135 tablet 4  . glucose blood (ACCU-CHEK GUIDE) test strip Use as instructed to  check blood sugar once daily. 100 each 1  . Lancets Misc. (ACCU-CHEK FASTCLIX LANCET) KIT by Does not apply route. USE LANCET TO CHECK BLOOD SUGAR ONCE DAILY.    Marland Kitchen loratadine (CLARITIN) 10 MG tablet Take 1 tablet (10 mg total) by mouth 2 (two) times daily as needed for allergies. 180 tablet 3  . metFORMIN (GLUCOPHAGE-XR) 500 MG 24 hr tablet TAKE 2 TABLETS EVERY DAY WITH SUPPER 180 tablet 1  . omeprazole (PRILOSEC) 20 MG capsule Take 20 mg by mouth daily.    . simvastatin (ZOCOR) 20 MG tablet TAKE 1 TABLET BY MOUTH ONCE DAILY. 30 tablet 3  . spironolactone (ALDACTONE) 50 MG tablet TAKE 1 TABLET BY MOUTH EVERY DAY 90 tablet 1   No current facility-administered medications on file prior to visit.     BP 130/80   Temp 98.3 F (36.8 C) (Temporal)   Ht _0  (1.575 m) Comment: WITH SHOES  Wt 153 lb (69.4 kg)   BMI 27.98 kg/m       Objective:   Physical Exam Vitals signs and nursing note reviewed.  Constitutional:      General: She is not in acute distress.    Appearance: She is not diaphoretic.  HENT:     Head: Normocephalic and atraumatic.     Right Ear: External ear normal.     Left Ear: External ear normal.     Nose: Nose normal.     Mouth/Throat:     Pharynx: No oropharyngeal exudate.  Eyes:     General: No scleral icterus.       Right eye: No discharge.        Left eye: No discharge.     Conjunctiva/sclera: Conjunctivae normal.     Pupils: Pupils are equal, round, and reactive to light.  Neck:     Musculoskeletal: Normal range of motion and neck supple.     Thyroid: No thyromegaly.     Vascular: No  JVD.     Trachea: No tracheal deviation.  Cardiovascular:     Rate and Rhythm: Normal rate and regular rhythm.     Heart sounds: Murmur present. Systolic murmur present with a grade of 2/6. No friction rub. No gallop.   Pulmonary:     Effort: Pulmonary effort is normal. No respiratory distress.     Breath sounds: Normal breath sounds. No stridor. No wheezing or rales.  Chest:     Chest wall: No tenderness.  Abdominal:     General: Bowel sounds are normal. There is no distension.     Palpations: Abdomen is soft. There is no mass.     Tenderness: There is no abdominal tenderness. There is no guarding or rebound.  Musculoskeletal: Normal range of motion.        General: No tenderness.  Lymphadenopathy:     Cervical: No cervical adenopathy.  Skin:    General: Skin is warm and dry.     Coloration: Skin is not pale.     Findings: No erythema or rash.  Neurological:     Mental Status: She is alert and oriented to person, place, and time.     Cranial Nerves: No cranial nerve deficit.     Motor: No abnormal muscle tone.     Coordination: Coordination normal.     Deep Tendon Reflexes: Reflexes normal.  Psychiatric:        Judgment: Judgment normal.        Assessment & Plan:  1. Essential hypertension - No  change in medication - Follow up in one year or sooner if needed - atenolol-chlorthalidone (TENORETIC) 50-25 MG tablet; Take 0.5 tablets by mouth daily.  Dispense: 45 tablet; Refill: 3 - CBC with Differential/Platelet - Lipid panel - TSH - Basic metabolic panel  2. Hyperlipidemia, unspecified hyperlipidemia type - Encouraged to take statin.  - We reviewed complications of hyperlipidemia especially since she is a diabetic  - CBC with Differential/Platelet - Lipid panel - TSH - Basic metabolic panel  3. Uncontrolled type 2 diabetes mellitus with hyperglycemia ([place]) - Follow up with endocrinology as directed  - CBC with Differential/Platelet - Lipid panel - TSH - Basic  metabolic panel  [name], NP

## 2019-07-20 ENCOUNTER — Other Ambulatory Visit (INDEPENDENT_AMBULATORY_CARE_PROVIDER_SITE_OTHER): Payer: Medicare Other

## 2019-07-20 ENCOUNTER — Other Ambulatory Visit: Payer: Self-pay

## 2019-07-20 DIAGNOSIS — E7800 Pure hypercholesterolemia, unspecified: Secondary | ICD-10-CM

## 2019-07-20 DIAGNOSIS — E1165 Type 2 diabetes mellitus with hyperglycemia: Secondary | ICD-10-CM | POA: Diagnosis not present

## 2019-07-20 LAB — COMPREHENSIVE METABOLIC PANEL
ALT: 12 U/L (ref 0–35)
AST: 16 U/L (ref 0–37)
Albumin: 4.7 g/dL (ref 3.5–5.2)
Alkaline Phosphatase: 84 U/L (ref 39–117)
BUN: 26 mg/dL — ABNORMAL HIGH (ref 6–23)
CO2: 28 mEq/L (ref 19–32)
Calcium: 9.8 mg/dL (ref 8.4–10.5)
Chloride: 101 mEq/L (ref 96–112)
Creatinine, Ser: 1.08 mg/dL (ref 0.40–1.20)
GFR: 59.9 mL/min — ABNORMAL LOW (ref >60.00)
Glucose, Bld: 152 mg/dL — ABNORMAL HIGH (ref 70–99)
Potassium: 4.7 mEq/L (ref 3.5–5.1)
Sodium: 139 mEq/L (ref 135–145)
Total Bilirubin: 0.3 mg/dL (ref 0.2–1.2)
Total Protein: 8.1 g/dL (ref 6.0–8.3)

## 2019-07-20 LAB — LIPID PANEL
Cholesterol: 147 mg/dL (ref 0–200)
HDL: 55.1 mg/dL (ref >39.00)
LDL Cholesterol: 70 mg/dL (ref 0–99)
NonHDL: 92.28
Total CHOL/HDL Ratio: 3
Triglycerides: 112 mg/dL (ref 0.0–149.0)
VLDL: 22.4 mg/dL (ref 0.0–40.0)

## 2019-07-20 LAB — HEMOGLOBIN A1C: Hgb A1c MFr Bld: 6.4 % (ref 4.6–6.5)

## 2019-07-22 ENCOUNTER — Ambulatory Visit (INDEPENDENT_AMBULATORY_CARE_PROVIDER_SITE_OTHER): Payer: Medicare Other | Admitting: Endocrinology

## 2019-07-22 ENCOUNTER — Encounter: Payer: Self-pay | Admitting: Endocrinology

## 2019-07-22 ENCOUNTER — Other Ambulatory Visit: Payer: Self-pay

## 2019-07-22 DIAGNOSIS — E669 Obesity, unspecified: Secondary | ICD-10-CM

## 2019-07-22 DIAGNOSIS — E7800 Pure hypercholesterolemia, unspecified: Secondary | ICD-10-CM

## 2019-07-22 DIAGNOSIS — E1169 Type 2 diabetes mellitus with other specified complication: Secondary | ICD-10-CM

## 2019-07-22 NOTE — Progress Notes (Signed)
j         Patient ID: Pam Obrien, female   DOB: 12/03/1945, 73 y.o.   MRN: [id number]  Today's office visit was provided via telemedicine using a telephone call to the patient Patient has been explained the limitations of evaluation and management by telemedicine and the availability of in person appointments.  The patient understood the limitations and agreed to proceed. Patient also understood that the telehealth visit is billable. . Location of the patient: Home . Location of the provider: Office Only the patient and myself were participating in the encounter   Reason for Appointment: Followup for Type 2 Diabetes   History of Present Illness:          Diagnosis: Type 2 diabetes mellitus, date of diagnosis:  4/13      Past history:  She was initially started on metformin but he thinks that this made her sleepy and had some abdominal discomfort   She also has been tried on other oral hypoglycemic regimens including Jentadueto  since 2014  But she had similar side effects with this  And not clear how regularly she had been taking this until it was discontinued in 5/15  A1c appears to be persistently over 8% Since 2014  She was tried on Invokamet by her PCP but because of excessive urination with this and also some nausea and weakness it was stopped With glyburide alone her blood sugars are poorly controlled and she was referred here for further management She was given Januvia but she did not take this because of expense of the medication   She was referred to the nurse educator and started on basal insulin to improve her control on 06/07/14 This was changed to premixed insulin in 9/15 because of continued poor control and her not being able to afford NovoLog mix insulin or Invokana  She stopped taking insulin in 1/16 because of the cost and was taking only Amaryl.  Recent history:   Hypoglycemic drugs: Glimepiride 4 mg at supper, metformin ER 500 mg in a.m. and pm    Her A1c  is back down to 6.4, previously was higher at 7%,  Current management, blood sugar patterns and problems identified:  She had a higher A1c on her last visit but not clear why because her sugars usually at home are fairly good  Also monitoring is somewhat sporadic and recently checking blood sugars mostly in the evenings after supper only  Highest blood sugar recently at home is only 133  Lab glucose was 152 after lunch  Does try to do some walking although not regularly and not for long time, previously was not motivated to do any activity  She has not checked her weight at home  No hypoglycemic symptoms with Amaryl for a couple of months, may sometimes feel symptomatic when blood sugar is as low as 80  She is usually reluctant to take brand-name medication because of cost  Glucose monitoring with Accu-Chek meter readings obtained from patient at home:    PRE-MEAL Fasting Lunch Dinner Bedtime Overall  Glucose range:   133  92  116-123   Mean/median:        POST-MEAL PC Breakfast PC Lunch PC Dinner  Glucose range:    102-132  Mean/median:      PREVIOUS readings:  PRE-MEAL Fasting Lunch Dinner Bedtime Overall  Glucose range:  99, 120  104-120   102-142   Mean/median:     ?    Side effects from  medications have been: Metformin higher doses causes abdominal discomfort Compliance with the medical regimen: fair  Self-care: The diet that the patient has been following is: tries to limit  portions      Meals:  usually 2-3 meals per day. Usually has variable intake in the morning, sometimes toast/fruit, usually has carbohydrates at dinner time, will have fruit for snacks           Exercise: Currently not walking       Dietician visit: Most recent: 6/17 .              CDE visit last in 7/15  Wt Readings from Last 3 Encounters:  06/22/19 153 lb (69.4 kg)  12/22/18 149 lb (67.6 kg)  06/17/18 159 lb (72.1 kg)       Glycemic control:   Lab Results  Component Value Date    HGBA1C 6.4 07/20/2019   HGBA1C 7.0 (H) 04/20/2019   HGBA1C 6.5 12/09/2018   Lab Results  Component Value Date   MICROALBUR <0.7 04/20/2019   [place] 70 07/20/2019   CREATININE 1.08 07/20/2019    Lab on 07/20/2019  Component Date Value Ref Range Status  . Cholesterol 07/20/2019 147  0 - 200 mg/dL Final   ATP III Classification       Desirable:  < 200 mg/dL               Borderline High:  200 - 239 mg/dL          High:  > = 240 mg/dL  . Triglycerides 07/20/2019 112.0  0.0 - 149.0 mg/dL Final   Normal:  <150 mg/dLBorderline High:  150 - 199 mg/dL  . HDL 07/20/2019 55.10  >39.00 mg/dL Final  . VLDL 07/20/2019 22.4  0.0 - 40.0 mg/dL Final  . LDL Cholesterol 07/20/2019 70  0 - 99 mg/dL Final  . Total CHOL/HDL Ratio 07/20/2019 3   Final                  Men          Women1/2 Average Risk     3.4          3.3Average Risk          5.0          4.42X Average Risk          9.6          7.13X Average Risk          15.0          11.0                      . NonHDL 07/20/2019 92.28   Final   NOTE:  Non-HDL goal should be 30 mg/dL higher than patient's LDL goal (i.e. LDL goal of < 70 mg/dL, would have non-HDL goal of < 100 mg/dL)  . Sodium 07/20/2019 139  135 - 145 mEq/L Final  . Potassium 07/20/2019 4.7  3.5 - 5.1 mEq/L Final  . Chloride 07/20/2019 101  96 - 112 mEq/L Final  . CO2 07/20/2019 28  19 - 32 mEq/L Final  . Glucose, Bld 07/20/2019 152* 70 - 99 mg/dL Final  . BUN 07/20/2019 26* 6 - 23 mg/dL Final  . Creatinine, Ser 07/20/2019 1.08  0.40 - 1.20 mg/dL Final  . Total Bilirubin 07/20/2019 0.3  0.2 - 1.2 mg/dL Final  . Alkaline Phosphatase 07/20/2019 84  39 - 117 U/L Final  .  AST 07/20/2019 16  0 - 37 U/L Final  . ALT 07/20/2019 12  0 - 35 U/L Final  . Total Protein 07/20/2019 8.1  6.0 - 8.3 g/dL Final  . Albumin 07/20/2019 4.7  3.5 - 5.2 g/dL Final  . Calcium 07/20/2019 9.8  8.4 - 10.5 mg/dL Final  . GFR 07/20/2019 59.90* >60.00 mL/min Final  . Hgb A1c MFr Bld 07/20/2019 6.4  4.6 - 6.5  % Final   Glycemic Control Guidelines for People with Diabetes:Non Diabetic:  <6%Goal of Therapy: <7%Additional Action Suggested:  >8%      Weight history:  Previous range upto 172   Wt Readings from Last 3 Encounters:  06/22/19 153 lb (69.4 kg)  12/22/18 149 lb (67.6 kg)  06/17/18 159 lb (72.1 kg)    Allergies as of 07/22/2019      Reactions   Aspirin    REACTION: nervous   Beef-derived Products    Sneezing, nasal drainage, throat scratchy   Celecoxib    REACTION: joints swell   Milk-related Compounds    itching   Mold Extract [trichophyton]    Pt does not know the reaction.  Positive allergy test per pt.   Nsaids    REACTION: sweling   Peanuts [peanut Oil]    Throat feels scratchy   Shellfish Allergy    Nasal congestion, throat scratchy, eyes watery   Sulfamethoxazole    REACTION: urticaria (hives)   Vioxx [rofecoxib] Swelling      Medication List       Accurate as of July 22, 2019  4:39 PM. If you have any questions, ask your nurse or doctor.        Accu-Chek [employer] Kit by Does not apply route. USE LANCET TO CHECK BLOOD SUGAR ONCE DAILY.   Accu-Chek Softclix Lancets lancets USE AS DIRECTED EVERY DAY   acetaminophen 500 MG tablet Commonly known as: TYLENOL Take 1 tablet (500 mg total) by mouth every 6 (six) hours as needed.   atenolol-chlorthalidone 50-25 MG tablet Commonly known as: TENORETIC Take 0.5 tablets by mouth daily.   calcium citrate-vitamin D 315-200 MG-UNIT tablet Commonly known as: CITRACAL+D Take 1 tablet by mouth daily.   cholecalciferol 1000 units tablet Commonly known as: VITAMIN D Take 2,000 Units by mouth daily.   diclofenac sodium 1 % Gel Commonly known as: VOLTAREN Apply 2 g topically 4 (four) times daily.   glimepiride 4 MG tablet Commonly known as: AMARYL TAKE 1 TABLET EVERY DAY AT DINNER AND 1/2 TABLET AT BEDTIME   glucose blood test strip Commonly known as: Accu-Chek Guide Use as instructed to check  blood sugar once daily.   loratadine 10 MG tablet Commonly known as: CLARITIN Take 1 tablet (10 mg total) by mouth 2 (two) times daily as needed for allergies.   metFORMIN 500 MG 24 hr tablet Commonly known as: GLUCOPHAGE-XR TAKE 2 TABLETS EVERY DAY WITH SUPPER   omeprazole 20 MG capsule Commonly known as: PRILOSEC Take 20 mg by mouth daily.   Se-Tan PLUS 162-115.2-1 MG Caps TAKE 1 CAPSULE BY MOUTH EVERY DAY   simvastatin 20 MG tablet Commonly known as: ZOCOR TAKE 1 TABLET BY MOUTH ONCE DAILY.   spironolactone 50 MG tablet Commonly known as: ALDACTONE TAKE 1 TABLET BY MOUTH EVERY DAY       Allergies:  Allergies  Allergen Reactions  . Aspirin     REACTION: nervous  . Beef-Derived Products     Sneezing, nasal drainage, throat scratchy  . Celecoxib  REACTION: joints swell  . Milk-Related Compounds     itching  . Mold Extract [Trichophyton]     Pt does not know the reaction.  Positive allergy test per pt.  . Nsaids     REACTION: sweling  . Peanuts [Peanut Oil]     Throat feels scratchy  . Shellfish Allergy     Nasal congestion, throat scratchy, eyes watery  . Sulfamethoxazole     REACTION: urticaria (hives)  . Vioxx [Rofecoxib] Swelling    Past Medical History:  Diagnosis Date  . Allergy   . Anemia    as a young adult  . Arthritis   . Diabetes mellitus without complication ([place])   . Hyperlipidemia    was normal range 05/2017 with PCP  . Hypertension     Past Surgical History:  Procedure Laterality Date  . ABDOMINAL HYSTERECTOMY  1994  . COLONOSCOPY  2008  . FLEXIBLE SIGMOIDOSCOPY  2003  . POLYPECTOMY      Family History  Problem Relation Age of Onset  . Hypertension Sister   . Stroke Unknown   . Diabetes Father   . Hypertension Father   . Stroke Father   . Diabetes Sister   . Diabetes Brother   . Hypertension Brother   . Stroke Brother   . Esophageal cancer Neg Hx   . Rectal cancer Neg Hx   . Stomach cancer Neg Hx   . Colon cancer  Neg Hx     Social History:  reports that she has never smoked. She has never used smokeless tobacco. She reports that she does not drink alcohol or use drugs.    Review of Systems    HYPERTENSION: She takes a half a tablet of atenolol/chlorthalidone along with half tablet of spironolactone and followed by PCP Does not check blood pressure at home  BP Readings from Last 3 Encounters:  06/22/19 130/80  12/22/18 122/74  07/30/18 132/72     HYPOKALEMIA: Previously from HCTZ, managed with Aldactone This is controlled with taking Aldactone 50 mg 1/2  Lab Results  Component Value Date   CREATININE 1.08 07/20/2019   BUN 26 (H) 07/20/2019   NA 139 07/20/2019   K 4.7 07/20/2019   CL 101 07/20/2019   CO2 28 07/20/2019    Lipids: She did not take pravastatin previously prescribed because of cost She was recommended taking simvastatin in 7/19 and again on her last visit in 2/20  Appears that she wants to just started taking the medication finally because her LDL is much better compared to her labs in August done by PCP  She does not have any difficulties with taking the medication although unclear whether she is taking 1/2 tablet or a full tablet  She does have family history of CVA but no known history of CAD    Lab Results  Component Value Date   CHOL 147 07/20/2019   CHOL 207 (H) 06/22/2019   CHOL 203 (H) 04/20/2019   Lab Results  Component Value Date   HDL 55.10 07/20/2019   HDL 54.20 06/22/2019   HDL 49.60 04/20/2019   Lab Results  Component Value Date   LDLCALC 70 07/20/2019   LDLCALC 136 (H) 06/22/2019   LDLCALC 129 (H) 04/20/2019   Lab Results  Component Value Date   TRIG 112.0 07/20/2019   TRIG 87.0 06/22/2019   TRIG 125.0 04/20/2019   Lab Results  Component Value Date   CHOLHDL 3 07/20/2019   CHOLHDL 4 06/22/2019   CHOLHDL  4 04/20/2019   Lab Results  Component Value Date   LDLDIRECT 130.0 05/17/2016   LDLDIRECT 121.7 03/04/2012     Last foot  exam was in 12/2018, she does not complain of any  symptoms of neuropathy   Physical Examination:  There were no vitals taken for this visit.      ASSESSMENT:  Diabetes type 2   See history of present illness for detailed discussion of  current management, blood sugar patterns and problems identified  A1c is back down to 6.4  Her diet likely is better and she has fairly good readings at home without hypoglycemia on Amaryl and metformin She has done some walking since her last visit Unable to check from weight Highest blood sugar appears to be only 152 which was done after meal  Explained to the patient that she may have higher readings if she has any high carbohydrate or higher fat meals and not just from high sugar content of foods   LIPIDS: She has started simvastatin, likely only recently with improvement in her lipids Baseline LDL was above 100  PLAN:    She will continue same regimen of her diabetes medication  She will call if she has any tendency to hypoglycemia  Encourage her to walk more regularly and for longer periods of time  To start checking blood sugars at different times of the day and not just in the evenings Encourage her to take a full tablet of Zocor on a regular basis long-term for cardiovascular benefits Follow-up in 4 months  Again recommended influenza vaccine but she refuses  There are no Patient Instructions on file for this visit.    Duration of telephone encounter =62mnutes   AElayne Snare9/08/2019, 4:39 PM   Note: This office note was prepared with Dragon voice recognition system technology. Any transcriptional errors that result from this process are unintentional.

## 2019-07-23 ENCOUNTER — Other Ambulatory Visit: Payer: Self-pay | Admitting: Adult Health

## 2019-07-23 DIAGNOSIS — Z1231 Encounter for screening mammogram for malignant neoplasm of breast: Secondary | ICD-10-CM

## 2019-08-05 ENCOUNTER — Ambulatory Visit: Payer: Medicare Other

## 2019-09-21 ENCOUNTER — Other Ambulatory Visit: Payer: Self-pay

## 2019-09-21 ENCOUNTER — Ambulatory Visit
Admission: RE | Admit: 2019-09-21 | Discharge: 2019-09-21 | Disposition: A | Discharge status: Home / Self Care | Payer: Medicare Other | Source: Ambulatory Visit | Attending: Adult Health | Admitting: Adult Health

## 2019-09-21 DIAGNOSIS — Z1231 Encounter for screening mammogram for malignant neoplasm of breast: Secondary | ICD-10-CM | POA: Diagnosis not present

## 2019-10-31 ENCOUNTER — Other Ambulatory Visit: Payer: Self-pay | Admitting: Endocrinology

## 2019-11-01 ENCOUNTER — Other Ambulatory Visit: Payer: Self-pay | Admitting: Adult Health

## 2019-11-01 ENCOUNTER — Other Ambulatory Visit: Payer: Self-pay | Admitting: Endocrinology

## 2019-11-01 NOTE — Telephone Encounter (Signed)
Please refill if it has been prescribed by me the last time

## 2019-11-01 NOTE — Telephone Encounter (Signed)
Chart says Aldactone is followed by PCP. Refill or send to PCP?

## 2019-11-16 ENCOUNTER — Other Ambulatory Visit: Payer: Medicare Other

## 2019-11-19 ENCOUNTER — Ambulatory Visit: Payer: Medicare Other | Admitting: Endocrinology

## 2019-11-27 ENCOUNTER — Other Ambulatory Visit: Payer: Self-pay | Admitting: Endocrinology

## 2019-12-02 ENCOUNTER — Other Ambulatory Visit: Payer: Self-pay

## 2019-12-02 ENCOUNTER — Other Ambulatory Visit (INDEPENDENT_AMBULATORY_CARE_PROVIDER_SITE_OTHER): Payer: Medicare Other

## 2019-12-02 DIAGNOSIS — E669 Obesity, unspecified: Secondary | ICD-10-CM | POA: Diagnosis not present

## 2019-12-02 DIAGNOSIS — E1169 Type 2 diabetes mellitus with other specified complication: Secondary | ICD-10-CM | POA: Diagnosis not present

## 2019-12-02 DIAGNOSIS — E7800 Pure hypercholesterolemia, unspecified: Secondary | ICD-10-CM | POA: Diagnosis not present

## 2019-12-02 LAB — BASIC METABOLIC PANEL
BUN: 30 mg/dL — ABNORMAL HIGH (ref 6–23)
CO2: 28 mEq/L (ref 19–32)
Calcium: 9.7 mg/dL (ref 8.4–10.5)
Chloride: 98 mEq/L (ref 96–112)
Creatinine, Ser: 1.25 mg/dL — ABNORMAL HIGH (ref 0.40–1.20)
GFR: 50.55 mL/min — ABNORMAL LOW (ref >60.00)
Glucose, Bld: 332 mg/dL — ABNORMAL HIGH (ref 70–99)
Potassium: 3.8 mEq/L (ref 3.5–5.1)
Sodium: 134 mEq/L — ABNORMAL LOW (ref 135–145)

## 2019-12-02 LAB — LDL CHOLESTEROL, DIRECT: Direct LDL: 98 mg/dL

## 2019-12-02 LAB — HEMOGLOBIN A1C: Hgb A1c MFr Bld: 7.3 % — ABNORMAL HIGH (ref 4.6–6.5)

## 2019-12-07 ENCOUNTER — Encounter: Payer: Self-pay | Admitting: Endocrinology

## 2019-12-07 ENCOUNTER — Ambulatory Visit (INDEPENDENT_AMBULATORY_CARE_PROVIDER_SITE_OTHER): Payer: Medicare Other | Admitting: Endocrinology

## 2019-12-07 ENCOUNTER — Other Ambulatory Visit: Payer: Self-pay

## 2019-12-07 DIAGNOSIS — E7800 Pure hypercholesterolemia, unspecified: Secondary | ICD-10-CM

## 2019-12-07 DIAGNOSIS — E1165 Type 2 diabetes mellitus with hyperglycemia: Secondary | ICD-10-CM | POA: Diagnosis not present

## 2019-12-07 DIAGNOSIS — I10 Essential (primary) hypertension: Secondary | ICD-10-CM

## 2019-12-07 MED ORDER — GLUCOSE BLOOD VI STRP: ORAL_STRIP | 2 refills | Status: DC

## 2019-12-07 MED ORDER — ACCU-CHEK AVIVA PLUS W/DEVICE KIT: PACK | 0 refills | Status: AC

## 2019-12-07 NOTE — Progress Notes (Signed)
Patient ID: Pam Obrien, female   DOB: 06/22/1946, 74 y.o.   MRN: [id number]  Today's office visit was provided via telemedicine using a telephone call to the patient Patient has been explained the limitations of evaluation and management by telemedicine and the availability of in person appointments.  The patient understood the limitations and agreed to proceed. Patient also understood that the telehealth visit is billable. . Location of the patient: Home . Location of the provider: Office Only the patient and myself were participating in the encounter   Reason for Appointment: Followup for Type 2 Diabetes   History of Present Illness:          Diagnosis: Type 2 diabetes mellitus, date of diagnosis:  4/13      Past history:  She was initially started on metformin but he thinks that this made her sleepy and had some abdominal discomfort   She also has been tried on other oral hypoglycemic regimens including Jentadueto  since 2014  But she had similar side effects with this  And not clear how regularly she had been taking this until it was discontinued in 5/15  A1c appears to be persistently over 8% Since 2014  She was tried on Invokamet by her PCP but because of excessive urination with this and also some nausea and weakness it was stopped With glyburide alone her blood sugars are poorly controlled and she was referred here for further management She was given Januvia but she did not take this because of expense of the medication   She was referred to the nurse educator and started on basal insulin to improve her control on 06/07/14 This was changed to premixed insulin in 9/15 because of continued poor control and her not being able to afford NovoLog mix insulin or Invokana  She stopped taking insulin in 1/16 because of the cost and was taking only Amaryl.  Recent history:   Hypoglycemic drugs: Glimepiride 4 mg at supper, metformin ER 500 mg in a.m. and pm    Her A1c  is up at 7.3, previously was down to 6.4  Current management, blood sugar patterns and problems identified:  She has had a higher A1c again and difficult to assess her blood sugars without home records  She says her meter battery is not functional and not clear how often she checks her blood sugars  She cannot explain why her blood sugar was 332 in the lab  Usually is avoiding regular soft drinks and high-fat meals  However she had not filled her Metformin for probably a week according to her prior to her getting her labs drawn  She claims that her blood sugars are not higher than 150 at anytime including after meals and usually about 130 after meals  Recently blood sugar was 120 at home  She has an old Accu-Chek [employer] meter  She does not think she can tolerate more than 2 tablets a day of Metformin ER  Also reluctant to take any brand-name prescriptions  She usually is not able to do much exercise or walking  Not clear if she has gained weight  Glucose monitoring with Accu-Chek meter  Readings as above  Previous readings:  PRE-MEAL Fasting Lunch Dinner Bedtime Overall  Glucose range:   133  92  116-123   Mean/median:        POST-MEAL PC Breakfast PC Lunch PC Dinner  Glucose range:    102-132  Mean/median:       Side  effects from medications have been: Metformin higher doses will cause abdominal discomfort  Self-care: The diet that the patient has been following is: tries to limit  portions      Meals:  usually 2-3 meals per day. Usually has variable intake in the morning, sometimes toast/fruit, usually has carbohydrates at dinner time, will have fruit for snacks        Dietician visit: Most recent: 6/17 .              CDE visit last in 7/15  Wt Readings from Last 3 Encounters:  06/22/19 153 lb (69.4 kg)  12/22/18 149 lb (67.6 kg)  06/17/18 159 lb (72.1 kg)       Glycemic control:   Lab Results  Component Value Date   HGBA1C 7.3 (H) 12/02/2019   HGBA1C  6.4 07/20/2019   HGBA1C 7.0 (H) 04/20/2019   Lab Results  Component Value Date   MICROALBUR <0.7 04/20/2019   LDLCALC 70 07/20/2019   CREATININE 1.25 (H) 12/02/2019    Lab on 12/02/2019  Component Date Value Ref Range Status  . Direct LDL 12/02/2019 98.0  mg/dL Final   Optimal:  <100 mg/dLNear or Above Optimal:  100-129 mg/dLBorderline High:  130-159 mg/dLHigh:  160-189 mg/dLVery High:  >190 mg/dL  . Sodium 12/02/2019 134* 135 - 145 mEq/L Final  . Potassium 12/02/2019 3.8  3.5 - 5.1 mEq/L Final  . Chloride 12/02/2019 98  96 - 112 mEq/L Final  . CO2 12/02/2019 28  19 - 32 mEq/L Final  . Glucose, Bld 12/02/2019 332* 70 - 99 mg/dL Final  . BUN 12/02/2019 30* 6 - 23 mg/dL Final  . Creatinine, Ser 12/02/2019 1.25* 0.40 - 1.20 mg/dL Final  . GFR 12/02/2019 50.55* >60.00 mL/min Final  . Calcium 12/02/2019 9.7  8.4 - 10.5 mg/dL Final  . Hgb A1c MFr Bld 12/02/2019 7.3* 4.6 - 6.5 % Final   Glycemic Control Guidelines for People with Diabetes:Non Diabetic:  <6%Goal of Therapy: <7%Additional Action Suggested:  >8%      Weight history:  Previous range upto 172   Wt Readings from Last 3 Encounters:  06/22/19 153 lb (69.4 kg)  12/22/18 149 lb (67.6 kg)  06/17/18 159 lb (72.1 kg)    Allergies as of 12/07/2019      Reactions   Aspirin    REACTION: nervous   Beef-derived Products    Sneezing, nasal drainage, throat scratchy   Celecoxib    REACTION: joints swell   Milk-related Compounds    itching   Mold Extract [trichophyton]    Pt does not know the reaction.  Positive allergy test per pt.   Nsaids    REACTION: sweling   Peanuts [peanut Oil]    Throat feels scratchy   Shellfish Allergy    Nasal congestion, throat scratchy, eyes watery   Sulfamethoxazole    REACTION: urticaria (hives)   Vioxx [rofecoxib] Swelling      Medication List       Accurate as of December 07, 2019  3:36 PM. If you have any questions, ask your nurse or doctor.        Accu-Chek [employer]  Kit by Does not apply route. USE LANCET TO CHECK BLOOD SUGAR ONCE DAILY.   Accu-Chek Softclix Lancets lancets USE AS DIRECTED EVERY DAY   acetaminophen 500 MG tablet Commonly known as: TYLENOL Take 1 tablet (500 mg total) by mouth every 6 (six) hours as needed.   atenolol-chlorthalidone 50-25 MG tablet Commonly known as:  TENORETIC Take 0.5 tablets by mouth daily.   calcium citrate-vitamin D 315-200 MG-UNIT tablet Commonly known as: CITRACAL+D Take 1 tablet by mouth daily.   cholecalciferol 1000 units tablet Commonly known as: VITAMIN D Take 2,000 Units by mouth daily.   diclofenac sodium 1 % Gel Commonly known as: VOLTAREN Apply 2 g topically 4 (four) times daily.   glimepiride 4 MG tablet Commonly known as: AMARYL Take 1 tablet by mouth daily at supper.   glucose blood test strip Commonly known as: Accu-Chek Guide Use as instructed to check blood sugar once daily.   loratadine 10 MG tablet Commonly known as: CLARITIN Take 1 tablet (10 mg total) by mouth 2 (two) times daily as needed for allergies.   metFORMIN 500 MG 24 hr tablet Commonly known as: GLUCOPHAGE-XR TAKE 2 TABLETS EVERY DAY WITH SUPPER   omeprazole 20 MG capsule Commonly known as: PRILOSEC Take 20 mg by mouth daily.   Se-Tan PLUS 162-115.2-1 MG Caps TAKE 1 CAPSULE BY MOUTH EVERY DAY   simvastatin 20 MG tablet Commonly known as: ZOCOR TAKE 1 TABLET BY MOUTH EVERY DAY   spironolactone 50 MG tablet Commonly known as: ALDACTONE TAKE 1 TABLET BY MOUTH EVERY DAY       Allergies:  Allergies  Allergen Reactions  . Aspirin     REACTION: nervous  . Beef-Derived Products     Sneezing, nasal drainage, throat scratchy  . Celecoxib     REACTION: joints swell  . Milk-Related Compounds     itching  . Mold Extract [Trichophyton]     Pt does not know the reaction.  Positive allergy test per pt.  . Nsaids     REACTION: sweling  . Peanuts [Peanut Oil]     Throat feels scratchy  . Shellfish Allergy      Nasal congestion, throat scratchy, eyes watery  . Sulfamethoxazole     REACTION: urticaria (hives)  . Vioxx [Rofecoxib] Swelling    Past Medical History:  Diagnosis Date  . Allergy   . Anemia    as a young adult  . Arthritis   . Diabetes mellitus without complication ([place])   . Hyperlipidemia    was normal range 05/2017 with PCP  . Hypertension     Past Surgical History:  Procedure Laterality Date  . ABDOMINAL HYSTERECTOMY  1994  . COLONOSCOPY  2008  . FLEXIBLE SIGMOIDOSCOPY  2003  . POLYPECTOMY      Family History  Problem Relation Age of Onset  . Hypertension Sister   . Stroke Other   . Diabetes Father   . Hypertension Father   . Stroke Father   . Diabetes Sister   . Diabetes Brother   . Hypertension Brother   . Stroke Brother   . Esophageal cancer Neg Hx   . Rectal cancer Neg Hx   . Stomach cancer Neg Hx   . Colon cancer Neg Hx     Social History:  reports that she has never smoked. She has never used smokeless tobacco. She reports that she does not drink alcohol or use drugs.    Review of Systems    HYPERTENSION: She takes a half a tablet of atenolol/chlorthalidone along with half tablet of spironolactone and followed by PCP Does not check blood pressure at home  BP Readings from Last 3 Encounters:  06/22/19 130/80  12/22/18 122/74  07/30/18 132/72    HYPOKALEMIA: Previously from HCTZ, now controlled with Aldactone Taking a half of a 50 mg tablet daily  Lab Results  Component Value Date   CREATININE 1.25 (H) 12/02/2019   BUN 30 (H) 12/02/2019   NA 134 (L) 12/02/2019   K 3.8 12/02/2019   CL 98 12/02/2019   CO2 28 12/02/2019    Lipids: She did not take pravastatin previously prescribed because of cost She was recommended taking simvastatin in 7/19 She was also told to start taking a whole tablet on her last visit and she thinks she is doing this  LDL is 98 although has been as low as 70  She does have family history of CVA but no known  history of CAD    Lab Results  Component Value Date   CHOL 147 07/20/2019   CHOL 207 (H) 06/22/2019   CHOL 203 (H) 04/20/2019   Lab Results  Component Value Date   HDL 55.10 07/20/2019   HDL 54.20 06/22/2019   HDL 49.60 04/20/2019   Lab Results  Component Value Date   LDLCALC 70 07/20/2019   LDLCALC 136 (H) 06/22/2019   LDLCALC 129 (H) 04/20/2019   Lab Results  Component Value Date   TRIG 112.0 07/20/2019   TRIG 87.0 06/22/2019   TRIG 125.0 04/20/2019   Lab Results  Component Value Date   CHOLHDL 3 07/20/2019   CHOLHDL 4 06/22/2019   CHOLHDL 4 04/20/2019   Lab Results  Component Value Date   LDLDIRECT 98.0 12/02/2019   LDLDIRECT 130.0 05/17/2016   LDLDIRECT 121.7 03/04/2012     Last foot exam was in 12/2018, she does not complain of any  symptoms of neuropathy   Physical Examination:  There were no vitals taken for this visit.      ASSESSMENT:  Diabetes type 2   See history of present illness for detailed discussion of  current management, blood sugar patterns and problems identified  A1c is back up to 7.3 and higher than usual  Her diet likely is not consistent although she is avoiding regular soft drinks easily May have been irregular with her Metformin and this may be causing some hyperglycemia However still difficult to explain her blood sugar of 332 in the lab She may not be having accurate readings on her old Accu-Chek meter  As before does not exercise much Weight was not checked today  Explained to the patient that she may have higher readings if she has any high carbohydrate or higher fat meals and not just from high sugar content of foods   LIPIDS: She has continued simvastatin,  LDL is below 100, discussed importance of staying on this long-term  History of hypokalemia: Controlled with Aldactone  PLAN:    She will be given a new glucose monitor  Encourage her to check sugars at least every other day and alternate fasting and  after meals  Although cost may be a factor she will likely need to consider adding an SGLT2 drug for better control and weight loss as well as cardiovascular risk reduction.  Will discuss on the next visit  Consider consultation with dietitian again for improved meal planning  Encouraged her to start walking regularly  Stay on simvastatin long-term  Regular eye exams   Advised her to get the Covid vaccine but she is reluctant to consider, discussed safety and benefits  There are no Patient Instructions on file for this visit.    Duration of telephone encounter = 11 minutes   Elayne Snare 12/07/2019, 3:36 PM   Note: This office note was prepared with Dragon voice recognition system technology.  Any transcriptional errors that result from this process are unintentional.

## 2020-02-07 ENCOUNTER — Other Ambulatory Visit: Payer: Self-pay

## 2020-02-07 ENCOUNTER — Ambulatory Visit (INDEPENDENT_AMBULATORY_CARE_PROVIDER_SITE_OTHER): Payer: Medicare Other | Admitting: Endocrinology

## 2020-02-07 ENCOUNTER — Encounter: Payer: Self-pay | Admitting: Endocrinology

## 2020-02-07 VITALS — BP 140/80 | HR 111 | Ht 62.0 in | Wt 160.8 lb

## 2020-02-07 DIAGNOSIS — E1165 Type 2 diabetes mellitus with hyperglycemia: Secondary | ICD-10-CM

## 2020-02-07 DIAGNOSIS — I10 Essential (primary) hypertension: Secondary | ICD-10-CM

## 2020-02-07 DIAGNOSIS — E7800 Pure hypercholesterolemia, unspecified: Secondary | ICD-10-CM | POA: Diagnosis not present

## 2020-02-07 LAB — COMPREHENSIVE METABOLIC PANEL
ALT: 13 U/L (ref 0–35)
AST: 18 U/L (ref 0–37)
Albumin: 4.5 g/dL (ref 3.5–5.2)
Alkaline Phosphatase: 71 U/L (ref 39–117)
BUN: 22 mg/dL (ref 6–23)
CO2: 26 mEq/L (ref 19–32)
Calcium: 9.4 mg/dL (ref 8.4–10.5)
Chloride: 104 mEq/L (ref 96–112)
Creatinine, Ser: 1.03 mg/dL (ref 0.40–1.20)
GFR: 63.17 mL/min (ref >60.00)
Glucose, Bld: 132 mg/dL — ABNORMAL HIGH (ref 70–99)
Potassium: 4.5 mEq/L (ref 3.5–5.1)
Sodium: 141 mEq/L (ref 135–145)
Total Bilirubin: 0.4 mg/dL (ref 0.2–1.2)
Total Protein: 7.4 g/dL (ref 6.0–8.3)

## 2020-02-07 LAB — LIPID PANEL
Cholesterol: 167 mg/dL (ref 0–200)
HDL: 50.8 mg/dL (ref >39.00)
LDL Cholesterol: 88 mg/dL (ref 0–99)
NonHDL: 116.01
Total CHOL/HDL Ratio: 3
Triglycerides: 138 mg/dL (ref 0.0–149.0)
VLDL: 27.6 mg/dL (ref 0.0–40.0)

## 2020-02-07 NOTE — Progress Notes (Signed)
Please call to let patient know that the lab results are good.  To continue the same doses of medications including cholesterol medicine

## 2020-02-07 NOTE — Patient Instructions (Addendum)
Check blood sugars on waking up 2-3 days a week  Also check blood sugars about 2 hours after meals and do this after different meals by rotation  Recommended blood sugar levels on waking up are 80-130 and about 2 hours after meal is 130-160  Please bring your blood sugar monitor to each visit, thank you   

## 2020-02-07 NOTE — Progress Notes (Signed)
Patient ID: Pam Obrien, female   DOB: [birth year], 74 y.o.   MRN: [id number]     Reason for Appointment: Followup for Type 2 Diabetes   History of Present Illness:          Diagnosis: Type 2 diabetes mellitus, date of diagnosis:  4/13      Past history:  She was initially started on metformin but he thinks that this made her sleepy and had some abdominal discomfort   She also has been tried on other oral hypoglycemic regimens including Jentadueto  since 2014  But she had similar side effects with this  And not clear how regularly she had been taking this until it was discontinued in 5/15  A1c appears to be persistently over 8% Since 2014  She was tried on Invokamet by her PCP but because of excessive urination with this and also some nausea and weakness it was stopped With glyburide alone her blood sugars are poorly controlled and she was referred here for further management She was given Januvia but she did not take this because of expense of the medication   She was referred to the nurse educator and started on basal insulin to improve her control on 06/07/14 This was changed to premixed insulin in 9/15 because of continued poor control and her not being able to afford NovoLog mix insulin or Invokana  She stopped taking insulin in 1/16 because of the cost and was taking only Amaryl.  Recent history:   Hypoglycemic drugs: Glimepiride 4 mg at supper, metformin ER 500 mg in a.m. and pm    Her A1c was at 7.3, previously was down to 6.4  Current management, blood sugar patterns and problems identified:  She has had a higher A1c on the last visit  She was given a new blood sugar meter which she brought in for download today  She is however checking blood sugars mostly around midday and most of these readings are likely before her first meal which is variable  Usually eating 2 meals a day  She says she does not go out to walk because of unsafe neighborhood  She  forgets to check her sugars after meals and also not clear which readings are before or after meals  No recent labs available to assess her level of control  She usually refuses to take brand-name medications   Glucose monitoring with Accu-Chek meter shows blood sugars down between about 11 AM-2 PM with AVERAGE 107 Blood sugar range 71-133   Previous readings:  PRE-MEAL Fasting Lunch Dinner Bedtime Overall  Glucose range:   133  92  116-123   Mean/median:        POST-MEAL PC Breakfast PC Lunch PC Dinner  Glucose range:    102-132  Mean/median:       Side effects from medications have been: Metformin higher doses will cause abdominal discomfort  Self-care: The diet that the patient has been following is: tries to limit  portions      Meals:  usually 2-3 meals per day. Usually has variable intake in the morning, sometimes toast/fruit, usually has carbohydrates at dinner time, will have fruit for snacks        Dietician visit: Most recent: 6/17 .              CDE visit last in 7/15  Wt Readings from Last 3 Encounters:  02/07/20 160 lb 12.8 oz (72.9 kg)  06/22/19 153 lb (69.4 kg)  12/22/18 149  lb (67.6 kg)       Glycemic control:   Lab Results  Component Value Date   HGBA1C 7.3 (H) 12/02/2019   HGBA1C 6.4 07/20/2019   HGBA1C 7.0 (H) 04/20/2019   Lab Results  Component Value Date   MICROALBUR <0.7 04/20/2019   LDLCALC 70 07/20/2019   CREATININE 1.25 (H) 12/02/2019    No visits with results within 1 Week(s) from this visit.  Latest known visit with results is:  Lab on 12/02/2019  Component Date Value Ref Range Status  . Direct LDL 12/02/2019 98.0  mg/dL Final   Optimal:  <100 mg/dLNear or Above Optimal:  100-129 mg/dLBorderline High:  130-159 mg/dLHigh:  160-189 mg/dLVery High:  >190 mg/dL  . Sodium 12/02/2019 134* 135 - 145 mEq/L Final  . Potassium 12/02/2019 3.8  3.5 - 5.1 mEq/L Final  . Chloride 12/02/2019 98  96 - 112 mEq/L Final  . CO2 12/02/2019 28  19 -  32 mEq/L Final  . Glucose, Bld 12/02/2019 332* 70 - 99 mg/dL Final  . BUN 12/02/2019 30* 6 - 23 mg/dL Final  . Creatinine, Ser 12/02/2019 1.25* 0.40 - 1.20 mg/dL Final  . GFR 12/02/2019 50.55* >60.00 mL/min Final  . Calcium 12/02/2019 9.7  8.4 - 10.5 mg/dL Final  . Hgb A1c MFr Bld 12/02/2019 7.3* 4.6 - 6.5 % Final   Glycemic Control Guidelines for People with Diabetes:Non Diabetic:  <6%Goal of Therapy: <7%Additional Action Suggested:  >8%      Weight history:  Previous range upto 172   Wt Readings from Last 3 Encounters:  02/07/20 160 lb 12.8 oz (72.9 kg)  06/22/19 153 lb (69.4 kg)  12/22/18 149 lb (67.6 kg)    Allergies as of 02/07/2020      Reactions   Aspirin    REACTION: nervous   Beef-derived Products    Sneezing, nasal drainage, throat scratchy   Celecoxib    REACTION: joints swell   Milk-related Compounds    itching   Mold Extract [trichophyton]    Pt does not know the reaction.  Positive allergy test per pt.   Nsaids    REACTION: sweling   Peanuts [peanut Oil]    Throat feels scratchy   Shellfish Allergy    Nasal congestion, throat scratchy, eyes watery   Sulfamethoxazole    REACTION: urticaria (hives)   Vioxx [rofecoxib] Swelling      Medication List       Accurate as of February 07, 2020  4:38 PM. If you have any questions, ask your nurse or doctor.        Accu-Chek Aviva Plus w/Device Kit Use Accu Chek Aviva Plus to check blood sugar once daily.   Accu-Chek [employer] Kit by Does not apply route. USE LANCET TO CHECK BLOOD SUGAR ONCE DAILY.   Accu-Chek Softclix Lancets lancets USE AS DIRECTED EVERY DAY   acetaminophen 500 MG tablet Commonly known as: TYLENOL Take 1 tablet (500 mg total) by mouth every 6 (six) hours as needed.   atenolol-chlorthalidone 50-25 MG tablet Commonly known as: TENORETIC Take 0.5 tablets by mouth daily.   calcium citrate-vitamin D 315-200 MG-UNIT tablet Commonly known as: CITRACAL+D Take 1 tablet by mouth daily.    cholecalciferol 1000 units tablet Commonly known as: VITAMIN D Take 2,000 Units by mouth daily.   diclofenac sodium 1 % Gel Commonly known as: VOLTAREN Apply 2 g topically 4 (four) times daily.   glimepiride 4 MG tablet Commonly known as: AMARYL Take 1 tablet by  mouth daily at supper.   glucose blood test strip Use Accu Chek Aviva test strips as instructed to check blood sugar once daily.   loratadine 10 MG tablet Commonly known as: CLARITIN Take 1 tablet (10 mg total) by mouth 2 (two) times daily as needed for allergies.   metFORMIN 500 MG 24 hr tablet Commonly known as: GLUCOPHAGE-XR TAKE 2 TABLETS EVERY DAY WITH SUPPER   omeprazole 20 MG capsule Commonly known as: PRILOSEC Take 20 mg by mouth daily.   Se-Tan PLUS 162-115.2-1 MG Caps TAKE 1 CAPSULE BY MOUTH EVERY DAY   simvastatin 20 MG tablet Commonly known as: ZOCOR TAKE 1 TABLET BY MOUTH EVERY DAY   spironolactone 50 MG tablet Commonly known as: ALDACTONE TAKE 1 TABLET BY MOUTH EVERY DAY       Allergies:  Allergies  Allergen Reactions  . Aspirin     REACTION: nervous  . Beef-Derived Products     Sneezing, nasal drainage, throat scratchy  . Celecoxib     REACTION: joints swell  . Milk-Related Compounds     itching  . Mold Extract [Trichophyton]     Pt does not know the reaction.  Positive allergy test per pt.  . Nsaids     REACTION: sweling  . Peanuts [Peanut Oil]     Throat feels scratchy  . Shellfish Allergy     Nasal congestion, throat scratchy, eyes watery  . Sulfamethoxazole     REACTION: urticaria (hives)  . Vioxx [Rofecoxib] Swelling    Past Medical History:  Diagnosis Date  . Allergy   . Anemia    as a young adult  . Arthritis   . Diabetes mellitus without complication ([place])   . Hyperlipidemia    was normal range 05/2017 with PCP  . Hypertension     Past Surgical History:  Procedure Laterality Date  . ABDOMINAL HYSTERECTOMY  1994  . COLONOSCOPY  2008  . FLEXIBLE  SIGMOIDOSCOPY  2003  . POLYPECTOMY      Family History  Problem Relation Age of Onset  . Hypertension Sister   . Stroke Other   . Diabetes Father   . Hypertension Father   . Stroke Father   . Diabetes Sister   . Diabetes Brother   . Hypertension Brother   . Stroke Brother   . Esophageal cancer Neg Hx   . Rectal cancer Neg Hx   . Stomach cancer Neg Hx   . Colon cancer Neg Hx     Social History:  reports that she has never smoked. She has never used smokeless tobacco. She reports that she does not drink alcohol or use drugs.    Review of Systems    HYPERTENSION: She takes a half a tablet of atenolol/chlorthalidone along with half tablet of spironolactone and followed by PCP Does not check blood pressure at home  BP Readings from Last 3 Encounters:  02/07/20 140/80  06/22/19 130/80  12/22/18 122/74    HYPOKALEMIA: Previously from HCTZ, now controlled with Aldactone Taking a half of a 50 mg tablet daily  Lab Results  Component Value Date   CREATININE 1.25 (H) 12/02/2019   CREATININE 1.08 07/20/2019   CREATININE 1.00 06/22/2019     Lab Results  Component Value Date   CREATININE 1.25 (H) 12/02/2019   BUN 30 (H) 12/02/2019   NA 134 (L) 12/02/2019   K 3.8 12/02/2019   CL 98 12/02/2019   CO2 28 12/02/2019    Lipids: She did not take pravastatin  previously prescribed because of cost She was recommended taking simvastatin in 7/19  She was also told to start taking a whole tablet on her last visit and she thinks she is doing this  LDL is 98 although has been as low as 70  She does have family history of CVA but no known history of CAD    Lab Results  Component Value Date   CHOL 147 07/20/2019   CHOL 207 (H) 06/22/2019   CHOL 203 (H) 04/20/2019   Lab Results  Component Value Date   HDL 55.10 07/20/2019   HDL 54.20 06/22/2019   HDL 49.60 04/20/2019   Lab Results  Component Value Date   LDLCALC 70 07/20/2019   LDLCALC 136 (H) 06/22/2019   LDLCALC  129 (H) 04/20/2019   Lab Results  Component Value Date   TRIG 112.0 07/20/2019   TRIG 87.0 06/22/2019   TRIG 125.0 04/20/2019   Lab Results  Component Value Date   CHOLHDL 3 07/20/2019   CHOLHDL 4 06/22/2019   CHOLHDL 4 04/20/2019   Lab Results  Component Value Date   LDLDIRECT 98.0 12/02/2019   LDLDIRECT 130.0 05/17/2016   LDLDIRECT 121.7 03/04/2012     Last foot exam was in 12/2018, she does not complain of any  symptoms of neuropathy   Physical Examination:  BP 140/80   Pulse (!) 111   Ht '5\' 2"'  (1.575 m)   Wt 160 lb 12.8 oz (72.9 kg)   SpO2 97%   BMI 29.41 kg/m       ASSESSMENT:  Diabetes type 2   See history of present illness for detailed discussion of  current management, blood sugar patterns and problems identified  A1c is last 7.3  Although her recent blood sugars at home are excellent most of the time she is usually checking only in the mornings.  Before or after breakfast which is a small meal Not checking after evening meal She is also not able to lose weight or exercise as expected   LIPIDS: She has continued simvastatin, needs follow-up labs Reassured her that this is not causing her symptoms of pulling on the right shoulder or left leg pain  History of hypokalemia: Controlled with Aldactone and will recheck labs today  Blood pressure is well controlled  PLAN:    As above  She needs to check more readings after dinner  Discussed her that blood sugars should be controlled both after meals and in the morning and may adjust her medications further if her evening blood sugars are high  She will try to walk and location conducive to her and she can go to a park or other location where she can walk safely  Will review labs when available  Check microalbumin   Patient Instructions  Check blood sugars on waking up 2-3  days a week  Also check blood sugars about 2 hours after meals and do this after different meals by  rotation  Recommended blood sugar levels on waking up are 80-130 and about 2 hours after meal is 130-160  Please bring your blood sugar monitor to each visit, thank you         Elayne Snare 02/07/2020, 4:38 PM   Note: This office note was prepared with Dragon voice recognition system technology. Any transcriptional errors that result from this process are unintentional.

## 2020-02-08 ENCOUNTER — Telehealth: Payer: Self-pay

## 2020-02-08 LAB — FRUCTOSAMINE: Fructosamine: 294 umol/L — ABNORMAL HIGH (ref 0–285)

## 2020-02-08 NOTE — Telephone Encounter (Signed)
-----   Message from Elayne Snare, MD sent at 02/07/2020  9:12 PM EDT ----- Please call to let patient know that the lab results are good.  To continue the same doses of medications including cholesterol medicine

## 2020-02-08 NOTE — Telephone Encounter (Signed)
LAB RESULTS  Lab results were reviewed by Dr. Dwyane Dee. A letter has been mailed to pt home address. For future reference, letter can be found in [place].

## 2020-05-10 ENCOUNTER — Ambulatory Visit (INDEPENDENT_AMBULATORY_CARE_PROVIDER_SITE_OTHER): Payer: Medicare Other | Admitting: Endocrinology

## 2020-05-10 ENCOUNTER — Encounter: Payer: Self-pay | Admitting: Endocrinology

## 2020-05-10 ENCOUNTER — Other Ambulatory Visit: Payer: Self-pay

## 2020-05-10 VITALS — BP 124/72 | HR 77 | Ht 62.0 in | Wt 157.0 lb

## 2020-05-10 DIAGNOSIS — E7800 Pure hypercholesterolemia, unspecified: Secondary | ICD-10-CM

## 2020-05-10 DIAGNOSIS — I10 Essential (primary) hypertension: Secondary | ICD-10-CM

## 2020-05-10 DIAGNOSIS — E1165 Type 2 diabetes mellitus with hyperglycemia: Secondary | ICD-10-CM | POA: Diagnosis not present

## 2020-05-10 LAB — BASIC METABOLIC PANEL
BUN: 30 mg/dL — ABNORMAL HIGH (ref 6–23)
CO2: 26 mEq/L (ref 19–32)
Calcium: 9.9 mg/dL (ref 8.4–10.5)
Chloride: 100 mEq/L (ref 96–112)
Creatinine, Ser: 1.23 mg/dL — ABNORMAL HIGH (ref 0.40–1.20)
GFR: 51.44 mL/min — ABNORMAL LOW (ref >60.00)
Glucose, Bld: 226 mg/dL — ABNORMAL HIGH (ref 70–99)
Potassium: 4.2 mEq/L (ref 3.5–5.1)
Sodium: 137 mEq/L (ref 135–145)

## 2020-05-10 LAB — POCT GLYCOSYLATED HEMOGLOBIN (HGB A1C): Hemoglobin A1C: 6.8 % — AB (ref 4.0–5.6)

## 2020-05-10 LAB — LIPID PANEL
Cholesterol: 199 mg/dL (ref 0–200)
HDL: 48.8 mg/dL (ref >39.00)
LDL Cholesterol: 131 mg/dL — ABNORMAL HIGH (ref 0–99)
NonHDL: 150.65
Total CHOL/HDL Ratio: 4
Triglycerides: 99 mg/dL (ref 0.0–149.0)
VLDL: 19.8 mg/dL (ref 0.0–40.0)

## 2020-05-10 NOTE — Patient Instructions (Signed)
Check blood sugars on waking up 2-3 days a week  Also check blood sugars about 2 hours after meals and do this after different meals by rotation  Recommended blood sugar levels on waking up are 90-130 and about 2 hours after meal is 130-160  Please bring your blood sugar monitor to each visit, thank you   

## 2020-05-10 NOTE — Progress Notes (Signed)
Patient ID: JAMESON MORROW, female   DOB: 1946/11/02, 74 y.o.   MRN: [id number]     Reason for Appointment: Followup for Type 2 Diabetes   History of Present Illness:          Diagnosis: Type 2 diabetes mellitus, date of diagnosis:  4/13      Past history:  She was initially started on metformin but he thinks that this made her sleepy and had some abdominal discomfort   She also has been tried on other oral hypoglycemic regimens including Jentadueto  since 2014  But she had similar side effects with this  And not clear how regularly she had been taking this until it was discontinued in 5/15  A1c appears to be persistently over 8% Since 2014  She was tried on Invokamet by her PCP but because of excessive urination with this and also some nausea and weakness it was stopped With glyburide alone her blood sugars are poorly controlled and she was referred here for further management She was given Januvia but she did not take this because of expense of the medication   She was referred to the nurse educator and started on basal insulin to improve her control on 06/07/14 This was changed to premixed insulin in 9/15 because of continued poor control and her not being able to afford NovoLog mix insulin or Invokana  She stopped taking insulin in 1/16 because of the cost and was taking only Amaryl.  Recent history:   Hypoglycemic drugs: Glimepiride 4 mg at supper, metformin ER 500 mg in a.m. and pm    Her A1c was at 7.3, now 6.8, has been as low as 6.4  Current management, blood sugar patterns and problems identified:  She had a higher A1c on the last visit  She was able to do some walking but only for about a month or so when she was with her daughter  She is having fairly good blood sugars at home when were higher only the first part of June  Not clear what her blood sugars are after dinner as she forgets to check them  Also not clear which of her readings below are after  meals  No hypoglycemia with Amaryl  Usually eating 2 meals a day  She again says she does not go out to walk because of unsafe neighborhood  No side effects with her reduced dose of Metformin  She usually refuses to consider brand-name medications   Glucose monitoring with Accu-Chek meter    PRE-MEAL Fasting Lunch  afternoon Bedtime Overall  Glucose range:   108-163  86-151    Mean/median:   132  127   129     Side effects from medications have been: Metformin higher doses will cause abdominal discomfort  Self-care: The diet that the patient has been following is: tries to limit  portions      Meals:  usually 2-3 meals per day. Usually has variable intake in the morning, sometimes toast/fruit, usually has carbohydrates at dinner time, will have fruit for snacks        Dietician visit: Most recent: 6/17 .              CDE visit last in 7/15  Wt Readings from Last 3 Encounters:  05/10/20 157 lb (71.2 kg)  02/07/20 160 lb 12.8 oz (72.9 kg)  06/22/19 153 lb (69.4 kg)       Glycemic control:   Lab Results  Component Value Date  HGBA1C 6.8 (A) 05/10/2020   HGBA1C 7.3 (H) 12/02/2019   HGBA1C 6.4 07/20/2019   Lab Results  Component Value Date   MICROALBUR <0.7 04/20/2019   [place] 88 02/07/2020   CREATININE 1.03 02/07/2020    Office Visit on 05/10/2020  Component Date Value Ref Range Status  . Hemoglobin A1C 05/10/2020 6.8* 4.0 - 5.6 % Final     Weight history:  Previous range upto 172   Wt Readings from Last 3 Encounters:  05/10/20 157 lb (71.2 kg)  02/07/20 160 lb 12.8 oz (72.9 kg)  06/22/19 153 lb (69.4 kg)    Allergies as of 05/10/2020      Reactions   Aspirin    REACTION: nervous   Beef-derived Products    Sneezing, nasal drainage, throat scratchy   Celecoxib    REACTION: joints swell   Milk-related Compounds    itching   Mold Extract [trichophyton]    Pt does not know the reaction.  Positive allergy test per pt.   Nsaids    REACTION:  sweling   Peanuts [peanut Oil]    Throat feels scratchy   Shellfish Allergy    Nasal congestion, throat scratchy, eyes watery   Sulfamethoxazole    REACTION: urticaria (hives)   Vioxx [rofecoxib] Swelling      Medication List       Accurate as of May 10, 2020  1:47 PM. If you have any questions, ask your nurse or doctor.        Accu-Chek Aviva Plus w/Device Kit Use Accu Chek Aviva Plus to check blood sugar once daily.   Accu-Chek [employer] Kit by Does not apply route. USE LANCET TO CHECK BLOOD SUGAR ONCE DAILY.   Accu-Chek Softclix Lancets lancets USE AS DIRECTED EVERY DAY   acetaminophen 500 MG tablet Commonly known as: TYLENOL Take 1 tablet (500 mg total) by mouth every 6 (six) hours as needed.   atenolol-chlorthalidone 50-25 MG tablet Commonly known as: TENORETIC Take 0.5 tablets by mouth daily.   calcium citrate-vitamin D 315-200 MG-UNIT tablet Commonly known as: CITRACAL+D Take 1 tablet by mouth daily.   cholecalciferol 1000 units tablet Commonly known as: VITAMIN D Take 2,000 Units by mouth daily.   diclofenac sodium 1 % Gel Commonly known as: VOLTAREN Apply 2 g topically 4 (four) times daily.   glimepiride 4 MG tablet Commonly known as: AMARYL Take 1 tablet by mouth daily at supper.   glucose blood test strip Use Accu Chek Aviva test strips as instructed to check blood sugar once daily.   loratadine 10 MG tablet Commonly known as: CLARITIN Take 1 tablet (10 mg total) by mouth 2 (two) times daily as needed for allergies.   metFORMIN 500 MG 24 hr tablet Commonly known as: GLUCOPHAGE-XR TAKE 2 TABLETS EVERY DAY WITH SUPPER   omeprazole 20 MG capsule Commonly known as: PRILOSEC Take 20 mg by mouth daily.   Se-Tan PLUS 162-115.2-1 MG Caps TAKE 1 CAPSULE BY MOUTH EVERY DAY   simvastatin 20 MG tablet Commonly known as: ZOCOR TAKE 1 TABLET BY MOUTH EVERY DAY What changed:   when to take this  additional instructions   spironolactone  50 MG tablet Commonly known as: ALDACTONE TAKE 1 TABLET BY MOUTH EVERY DAY       Allergies:  Allergies  Allergen Reactions  . Aspirin     REACTION: nervous  . Beef-Derived Products     Sneezing, nasal drainage, throat scratchy  . Celecoxib     REACTION: joints  swell  . Milk-Related Compounds     itching  . Mold Extract [Trichophyton]     Pt does not know the reaction.  Positive allergy test per pt.  . Nsaids     REACTION: sweling  . Peanuts [Peanut Oil]     Throat feels scratchy  . Shellfish Allergy     Nasal congestion, throat scratchy, eyes watery  . Sulfamethoxazole     REACTION: urticaria (hives)  . Vioxx [Rofecoxib] Swelling    Past Medical History:  Diagnosis Date  . Allergy   . Anemia    as a young adult  . Arthritis   . Diabetes mellitus without complication ([place])   . Hyperlipidemia    was normal range 05/2017 with PCP  . Hypertension     Past Surgical History:  Procedure Laterality Date  . ABDOMINAL HYSTERECTOMY  1994  . COLONOSCOPY  2008  . FLEXIBLE SIGMOIDOSCOPY  2003  . POLYPECTOMY      Family History  Problem Relation Age of Onset  . Hypertension Sister   . Stroke Other   . Diabetes Father   . Hypertension Father   . Stroke Father   . Diabetes Sister   . Diabetes Brother   . Hypertension Brother   . Stroke Brother   . Esophageal cancer Neg Hx   . Rectal cancer Neg Hx   . Stomach cancer Neg Hx   . Colon cancer Neg Hx     Social History:  reports that she has never smoked. She has never used smokeless tobacco. She reports that she does not drink alcohol and does not use drugs.    Review of Systems    HYPERTENSION: She takes a half a tablet of atenolol/chlorthalidone along with half tablet of spironolactone   Does not check blood pressure at home  BP Readings from Last 3 Encounters:  05/10/20 124/72  02/07/20 140/80  06/22/19 130/80    HYPOKALEMIA: This is secondary to HCTZ controlled with Aldactone Taking a half of a 50 mg  tablet daily  Lab Results  Component Value Date   CREATININE 1.03 02/07/2020   CREATININE 1.25 (H) 12/02/2019   CREATININE 1.08 07/20/2019     Lab Results  Component Value Date   CREATININE 1.03 02/07/2020   BUN 22 02/07/2020   NA 141 02/07/2020   K 4.5 02/07/2020   CL 104 02/07/2020   CO2 26 02/07/2020    Lipids: She did not take pravastatin previously prescribed because of cost She was recommended taking simvastatin in 7/19  She said that she has a aching and pulling sensation in her right chest to her arm when she takes simvastatin and will only take it every other day  LDL is 98 although has been as low as 70  She does have family history of CVA but no known history of CAD    Lab Results  Component Value Date   CHOL 167 02/07/2020   CHOL 147 07/20/2019   CHOL 207 (H) 06/22/2019   Lab Results  Component Value Date   HDL 50.80 02/07/2020   HDL 55.10 07/20/2019   HDL 54.20 06/22/2019   Lab Results  Component Value Date   LDLCALC 88 02/07/2020   LDLCALC 70 07/20/2019   LDLCALC 136 (H) 06/22/2019   Lab Results  Component Value Date   TRIG 138.0 02/07/2020   TRIG 112.0 07/20/2019   TRIG 87.0 06/22/2019   Lab Results  Component Value Date   CHOLHDL 3 02/07/2020   CHOLHDL  3 07/20/2019   CHOLHDL 4 06/22/2019   Lab Results  Component Value Date   LDLDIRECT 98.0 12/02/2019   LDLDIRECT 130.0 05/17/2016   LDLDIRECT 121.7 03/04/2012     Last foot exam was in 12/2018, she does not complain of any  symptoms of neuropathy   Physical Examination:  BP 124/72 (BP Location: Left Arm, Patient Position: Sitting, Cuff Size: Large)   Pulse 77   Ht '5\' 2"'  (1.575 m)   Wt 157 lb (71.2 kg)   SpO2 96%   BMI 28.72 kg/m       ASSESSMENT:  Diabetes type 2   See history of present illness for detailed discussion of  current management, blood sugar patterns and problems identified  A1c is 6.8 compared to 7.3  Blood sugars are pretty good at home although  again checking blood sugars mostly midday or early afternoon Blood sugars are higher about 3 weeks ago and may have been when she was not at home Also not clear if she may sometimes have high postprandial readings after dinner which she does not check  She has done a little better with some weight loss and at least some exercise or walking but cannot do this more consistently No hypoglycemic symptoms with Amaryl 4 mg She is again wanting to consider only generic medications despite cardiovascular risk reduction with other categories   LIPIDS: She has taken simvastatin only every other day, needs follow-up labs Encouraged her to stay on this  History of hypokalemia: Controlled with Aldactone and will recheck labs today  Blood pressure is well controlled with her regimen of atenolol/chlorthalidone from her PCP  PLAN:    As above  Labs to be checked today  Encouraged her to stay on simvastatin  More blood sugar readings after dinner  She can try to go to the mall to walk for regular exercise  To call if having unusually high or low readings  Needs urine microalbumin checked   There are no Patient Instructions on file for this visit.      Elayne Snare 05/10/2020, 1:47 PM   Note: This office note was prepared with Dragon voice recognition system technology. Any transcriptional errors that result from this process are unintentional.

## 2020-06-05 ENCOUNTER — Other Ambulatory Visit: Payer: Self-pay | Admitting: Adult Health

## 2020-06-05 ENCOUNTER — Other Ambulatory Visit: Payer: Self-pay | Admitting: Endocrinology

## 2020-06-05 DIAGNOSIS — I10 Essential (primary) hypertension: Secondary | ICD-10-CM

## 2020-06-06 NOTE — Telephone Encounter (Signed)
LEFT A MESSAGE FOR A RETURN CALL.  PT DUE FOR CPX.

## 2020-06-07 ENCOUNTER — Other Ambulatory Visit: Payer: Self-pay | Admitting: Endocrinology

## 2020-06-08 NOTE — Telephone Encounter (Signed)
Pt called on 06/07/20 and scheduled her cpx.  Rx sent to the pharmacy by e-scribe.

## 2020-06-21 DIAGNOSIS — H524 Presbyopia: Secondary | ICD-10-CM | POA: Diagnosis not present

## 2020-06-21 DIAGNOSIS — H35033 Hypertensive retinopathy, bilateral: Secondary | ICD-10-CM | POA: Diagnosis not present

## 2020-06-21 DIAGNOSIS — E119 Type 2 diabetes mellitus without complications: Secondary | ICD-10-CM | POA: Diagnosis not present

## 2020-06-21 DIAGNOSIS — H5203 Hypermetropia, bilateral: Secondary | ICD-10-CM | POA: Diagnosis not present

## 2020-06-21 DIAGNOSIS — I10 Essential (primary) hypertension: Secondary | ICD-10-CM | POA: Diagnosis not present

## 2020-06-21 DIAGNOSIS — H2513 Age-related nuclear cataract, bilateral: Secondary | ICD-10-CM | POA: Diagnosis not present

## 2020-06-21 DIAGNOSIS — H25813 Combined forms of age-related cataract, bilateral: Secondary | ICD-10-CM | POA: Diagnosis not present

## 2020-06-21 DIAGNOSIS — H52223 Regular astigmatism, bilateral: Secondary | ICD-10-CM | POA: Diagnosis not present

## 2020-06-21 LAB — HM DIABETES EYE EXAM

## 2020-06-30 ENCOUNTER — Other Ambulatory Visit: Payer: Self-pay

## 2020-06-30 ENCOUNTER — Ambulatory Visit (INDEPENDENT_AMBULATORY_CARE_PROVIDER_SITE_OTHER): Payer: Medicare Other

## 2020-06-30 DIAGNOSIS — Z780 Asymptomatic menopausal state: Secondary | ICD-10-CM

## 2020-06-30 DIAGNOSIS — Z0000 Encounter for general adult medical examination without abnormal findings: Secondary | ICD-10-CM | POA: Diagnosis not present

## 2020-06-30 NOTE — Patient Instructions (Signed)
Pam Obrien , Thank you for taking time to come for your Medicare Wellness Visit. I appreciate your ongoing commitment to your health goals. Please review the following plan we discussed and let me know if I can assist you in the future.   Screening recommendations/referrals: Colonoscopy: No longer required  Mammogram: Up to date, next due 09/20/2020 Bone Density: Currently due, orders placed this visit Recommended yearly ophthalmology/optometry visit for glaucoma screening and checkup Recommended yearly dental visit for hygiene and checkup  Vaccinations: Influenza vaccine: Patient refused Pneumococcal vaccine: completed series  Tdap vaccine: Currently due, you may contact your insurance company to discuss cost or await and injury to receive  Shingles vaccine: Currently due, please contact your pharmacy to discuss cost and to receive this vaccine     Advanced directives: Advance directive discussed with you today. Even though you declined this today please call our office should you change your mind and we can give you the proper paperwork for you to fill out.   Conditions/risks identified: None   Next appointment: 08/17/2020 @ 7:00 am with [name], NP for a physical   Preventive Care 75 Years and Older, Female Preventive care refers to lifestyle choices and visits with your health care provider that can promote health and wellness. What does preventive care include?  A yearly physical exam. This is also called an annual well check.  Dental exams once or twice a year.  Routine eye exams. Ask your health care provider how often you should have your eyes checked.  Personal lifestyle choices, including:  Daily care of your teeth and gums.  Regular physical activity.  Eating a healthy diet.  Avoiding tobacco and drug use.  Limiting alcohol use.  Practicing safe sex.  Taking low-dose aspirin every day.  Taking vitamin and mineral supplements as recommended by your  health care provider. What happens during an annual well check? The services and screenings done by your health care provider during your annual well check will depend on your age, overall health, lifestyle risk factors, and family history of disease. Counseling  Your health care provider may ask you questions about your:  Alcohol use.  Tobacco use.  Drug use.  Emotional well-being.  Home and relationship well-being.  Sexual activity.  Eating habits.  History of falls.  Memory and ability to understand (cognition).  Work and work Statistician.  Reproductive health. Screening  You may have the following tests or measurements:  Height, weight, and BMI.  Blood pressure.  Lipid and cholesterol levels. These may be checked every 5 years, or more frequently if you are over 38 years old.  Skin check.  Lung cancer screening. You may have this screening every year starting at age 75 if you have a 30-pack-year history of smoking and currently smoke or have quit within the past 15 years.  Fecal occult blood test (FOBT) of the stool. You may have this test every year starting at age 35.  Flexible sigmoidoscopy or colonoscopy. You may have a sigmoidoscopy every 5 years or a colonoscopy every 10 years starting at age 75.  Hepatitis C blood test.  Hepatitis B blood test.  Sexually transmitted disease (STD) testing.  Diabetes screening. This is done by checking your blood sugar (glucose) after you have not eaten for a while (fasting). You may have this done every 1-3 years.  Bone density scan. This is done to screen for osteoporosis. You may have this done starting at age 55.  Mammogram. This may be done  every 1-2 years. Talk to your health care provider about how often you should have regular mammograms. Talk with your health care provider about your test results, treatment options, and if necessary, the need for more tests. Vaccines  Your health care provider may recommend  certain vaccines, such as:  Influenza vaccine. This is recommended every year.  Tetanus, diphtheria, and acellular pertussis (Tdap, Td) vaccine. You may need a Td booster every 10 years.  Zoster vaccine. You may need this after age 48.  Pneumococcal 13-valent conjugate (PCV13) vaccine. One dose is recommended after age 46.  Pneumococcal polysaccharide (PPSV23) vaccine. One dose is recommended after age 75. Talk to your health care provider about which screenings and vaccines you need and how often you need them. This information is not intended to replace advice given to you by your health care provider. Make sure you discuss any questions you have with your health care provider. Document Released: 11/24/2015 Document Revised: 07/17/2016 Document Reviewed: 08/29/2015 Elsevier Interactive Patient Education  2017 [place] Prevention in the Home Falls can cause injuries. They can happen to people of all ages. There are many things you can do to make your home safe and to help prevent falls. What can I do on the outside of my home?  Regularly fix the edges of walkways and driveways and fix any cracks.  Remove anything that might make you trip as you walk through a door, such as a raised step or threshold.  Trim any bushes or trees on the path to your home.  Use bright outdoor lighting.  Clear any walking paths of anything that might make someone trip, such as rocks or tools.  Regularly check to see if handrails are loose or broken. Make sure that both sides of any steps have handrails.  Any raised decks and porches should have guardrails on the edges.  Have any leaves, snow, or ice cleared regularly.  Use sand or salt on walking paths during winter.  Clean up any spills in your garage right away. This includes oil or grease spills. What can I do in the bathroom?  Use night lights.  Install grab bars by the toilet and in the tub and shower. Do not use towel bars as  grab bars.  Use non-skid mats or decals in the tub or shower.  If you need to sit down in the shower, use a plastic, non-slip stool.  Keep the floor dry. Clean up any water that spills on the floor as soon as it happens.  Remove soap buildup in the tub or shower regularly.  Attach bath mats securely with double-sided non-slip rug tape.  Do not have throw rugs and other things on the floor that can make you trip. What can I do in the bedroom?  Use night lights.  Make sure that you have a light by your bed that is easy to reach.  Do not use any sheets or blankets that are too big for your bed. They should not hang down onto the floor.  Have a firm chair that has side arms. You can use this for support while you get dressed.  Do not have throw rugs and other things on the floor that can make you trip. What can I do in the kitchen?  Clean up any spills right away.  Avoid walking on wet floors.  Keep items that you use a lot in easy-to-reach places.  If you need to reach something above you, use a  strong step stool that has a grab bar.  Keep electrical cords out of the way.  Do not use floor polish or wax that makes floors slippery. If you must use wax, use non-skid floor wax.  Do not have throw rugs and other things on the floor that can make you trip. What can I do with my stairs?  Do not leave any items on the stairs.  Make sure that there are handrails on both sides of the stairs and use them. Fix handrails that are broken or loose. Make sure that handrails are as long as the stairways.  Check any carpeting to make sure that it is firmly attached to the stairs. Fix any carpet that is loose or worn.  Avoid having throw rugs at the top or bottom of the stairs. If you do have throw rugs, attach them to the floor with carpet tape.  Make sure that you have a light switch at the top of the stairs and the bottom of the stairs. If you do not have them, ask someone to add them  for you. What else can I do to help prevent falls?  Wear shoes that:  Do not have high heels.  Have rubber bottoms.  Are comfortable and fit you well.  Are closed at the toe. Do not wear sandals.  If you use a stepladder:  Make sure that it is fully opened. Do not climb a closed stepladder.  Make sure that both sides of the stepladder are locked into place.  Ask someone to hold it for you, if possible.  Clearly mark and make sure that you can see:  Any grab bars or handrails.  First and last steps.  Where the edge of each step is.  Use tools that help you move around (mobility aids) if they are needed. These include:  Canes.  Walkers.  Scooters.  Crutches.  Turn on the lights when you go into a dark area. Replace any light bulbs as soon as they burn out.  Set up your furniture so you have a clear path. Avoid moving your furniture around.  If any of your floors are uneven, fix them.  If there are any pets around you, be aware of where they are.  Review your medicines with your doctor. Some medicines can make you feel dizzy. This can increase your chance of falling. Ask your doctor what other things that you can do to help prevent falls. This information is not intended to replace advice given to you by your health care provider. Make sure you discuss any questions you have with your health care provider. Document Released: 08/24/2009 Document Revised: 04/04/2016 Document Reviewed: 12/02/2014 Elsevier Interactive Patient Education  2017 [employer].

## 2020-06-30 NOTE — Progress Notes (Addendum)
Subjective:   Pam Obrien is a 74 y.o. female who presents for Medicare Annual (Subsequent) preventive examination.  I connected with Owens Loffler today by telephone and verified that I am speaking with the correct person using two identifiers. Location patient: home Location provider: work Persons participating in the virtual visit: patient, provider.   I discussed the limitations, risks, security and privacy concerns of performing an evaluation and management service by telephone and the availability of in person appointments. I also discussed with the patient that there may be a patient responsible charge related to this service. The patient expressed understanding and verbally consented to this telephonic visit.    Interactive audio and video telecommunications were attempted between this provider and patient, however failed, due to patient having technical difficulties OR patient did not have access to video capability.  We continued and completed visit with audio only.      Review of Systems    N/A Cardiac Risk Factors include: advanced age (>14mn, >>44women);diabetes mellitus;dyslipidemia;hypertension     Objective:    Today's Vitals   There is no height or weight on file to calculate BMI.  Advanced Directives 06/30/2020 07/03/2017 05/27/2017 01/22/2017 03/22/2016  Does Patient Have a Medical Advance Directive? No No No No No  Would patient like information on creating a medical advance directive? No - Patient declined - Yes (MAU/Ambulatory/Procedural Areas - Information given) - No - patient declined information    Current Medications (verified) Outpatient Encounter Medications as of 06/30/2020  Medication Sig  . ACCU-CHEK SOFTCLIX LANCETS lancets USE AS DIRECTED EVERY DAY  . acetaminophen (TYLENOL) 500 MG tablet Take 1 tablet (500 mg total) by mouth every 6 (six) hours as needed.  .Marland Kitchenatenolol-chlorthalidone (TENORETIC) 50-25 MG tablet TAKE 1/2 TABLET BY MOUTH  EVERY DAY  . Blood Glucose Monitoring Suppl (ACCU-CHEK AVIVA PLUS) w/Device KIT Use Accu Chek Aviva Plus to check blood sugar once daily.  . calcium citrate-vitamin D (CITRACAL+D) 315-200 MG-UNIT per tablet Take 1 tablet by mouth daily.  . cholecalciferol (VITAMIN D) 1000 UNITS tablet Take 2,000 Units by mouth daily.  .Marland KitchenFeFum-FePo-FA-B Cmp-C-Zn-Mn-Cu (SE-TAN PLUS) 162-115.2-1 MG CAPS TAKE 1 CAPSULE BY MOUTH EVERY DAY  . glimepiride (AMARYL) 4 MG tablet Take 1 tablet by mouth daily at supper.  .Marland Kitchenglucose blood test strip Use Accu Chek Aviva test strips as instructed to check blood sugar once daily.  . Lancets Misc. (ACCU-CHEK FASTCLIX LANCET) KIT by Does not apply route. USE LANCET TO CHECK BLOOD SUGAR ONCE DAILY.  .Marland Kitchenloratadine (CLARITIN) 10 MG tablet Take 1 tablet (10 mg total) by mouth 2 (two) times daily as needed for allergies.  . metFORMIN (GLUCOPHAGE-XR) 500 MG 24 hr tablet TAKE 2 TABLETS EVERY DAY WITH SUPPER  . omeprazole (PRILOSEC) 20 MG capsule Take 20 mg by mouth daily.  . simvastatin (ZOCOR) 20 MG tablet TAKE 1 TABLET BY MOUTH EVERY DAY (Patient taking differently: every other day. )  . spironolactone (ALDACTONE) 50 MG tablet TAKE 1 TABLET BY MOUTH EVERY DAY  . diclofenac sodium (VOLTAREN) 1 % GEL Apply 2 g topically 4 (four) times daily. (Patient not taking: Reported on 05/10/2020)   No facility-administered encounter medications on file as of 06/30/2020.    Allergies (verified) Aspirin, Beef-derived products, Celecoxib, Milk-related compounds, Mold extract [trichophyton], Nsaids, Peanuts [peanut oil], Shellfish allergy, Sulfamethoxazole, and Vioxx [rofecoxib]   History: Past Medical History:  Diagnosis Date  . Allergy   . Anemia    as a young adult  .  Arthritis   . Diabetes mellitus without complication ([place])   . Hyperlipidemia    was normal range 05/2017 with PCP  . Hypertension    Past Surgical History:  Procedure Laterality Date  . ABDOMINAL HYSTERECTOMY  1994  .  COLONOSCOPY  2008  . FLEXIBLE SIGMOIDOSCOPY  2003  . POLYPECTOMY     Family History  Problem Relation Age of Onset  . Hypertension Sister   . Stroke Other   . Diabetes Father   . Hypertension Father   . Stroke Father   . Diabetes Sister   . Diabetes Brother   . Hypertension Brother   . Stroke Brother   . Esophageal cancer Neg Hx   . Rectal cancer Neg Hx   . Stomach cancer Neg Hx   . Colon cancer Neg Hx    Social History   Socioeconomic History  . Marital status: Married    Spouse name: Not on file  . Number of children: Not on file  . Years of education: Not on file  . Highest education level: Not on file  Occupational History  . Not on file  Tobacco Use  . Smoking status: Never Smoker  . Smokeless tobacco: Never Used  Vaping Use  . Vaping Use: Never used  Substance and Sexual Activity  . Alcohol use: No  . Drug use: No  . Sexual activity: Not on file  Other Topics Concern  . Not on file  Social History Narrative   Retired from [employer] work    Separated for 9 years.    Five children, two live locally, three are in [place]   One of her daughters live with her   Has a dog.    She likes to dance and walk   Social Determinants of Health   Financial Resource Strain: Low Risk   . Difficulty of Paying Living Expenses: Not hard at all  Food Insecurity: No Food Insecurity  . Worried About Charity fundraiser in the Last Year: Never true  . Ran Out of Food in the Last Year: Never true  Transportation Needs: No Transportation Needs  . Lack of Transportation (Medical): No  . Lack of Transportation (Non-Medical): No  Physical Activity: Insufficiently Active  . Days of Exercise per Week: 3 days  . Minutes of Exercise per Session: 30 min  Stress: No Stress Concern Present  . Feeling of Stress : Not at all  Social Connections: Moderately Isolated  . Frequency of Communication with Friends and Family: More than three times a week  . Frequency of Social Gatherings with  Friends and Family: More than three times a week  . Attends Religious Services: 1 to 4 times per year  . Active Member of Clubs or Organizations: No  . Attends Archivist Meetings: Never  . Marital Status: Divorced    Tobacco Counseling Counseling given: Not Answered   Clinical Intake:  Pre-visit preparation completed: Yes  Pain : No/denies pain     Nutritional Risks: None Diabetes: Yes (Patient states checks blood sugars 2-3 times per week) CBG done?: No Did pt. bring in CBG monitor from home?: No  How often do you need to have someone help you when you read instructions, pamphlets, or other written materials from your doctor or pharmacy?: 1 - Never What is the last grade level you completed in school?: Some College  Diabetic?Yes  Interpreter Needed?: No  Information entered by :: [place] of Daily Living In your present  state of health, do you have any difficulty performing the following activities: 06/30/2020  Hearing? N  Vision? N  Difficulty concentrating or making decisions? N  Walking or climbing stairs? N  Dressing or bathing? N  Doing errands, shopping? N  Preparing Food and eating ? N  Using the Toilet? N  In the past six months, have you accidently leaked urine? N  Do you have problems with loss of bowel control? N  Managing your Medications? N  Managing your Finances? N  Housekeeping or managing your Housekeeping? N  Some recent data might be hidden    Patient Care Team: [name], NP as PCP - General (Family Medicine)  Indicate any recent Medical Services you may have received from other than [facility] providers in the past year (date may be approximate).     Assessment:   This is a routine wellness examination for [place].  Hearing/Vision screen  Hearing Screening   '125Hz'  '250Hz'  '500Hz'  '1000Hz'  '2000Hz'  '3000Hz'  '4000Hz'  '6000Hz'  '8000Hz'   Right ear:           Left ear:           Vision Screening Comments: Patient states gets  eye checked annually    Dietary issues and exercise activities discussed: Current Exercise Habits: Home exercise routine, Type of exercise: stretching;walking, Time (Minutes): 30, Frequency (Times/Week): 3, Weekly Exercise (Minutes/Week): 90, Intensity: Mild, Exercise limited by: None identified  Goals    . DIET - INCREASE WATER INTAKE     Drink at least 64 ounces of water day    . patient     Explore new recipes Different when working;  Try new foods;  Will see [name] regarding appetite changes     . Prevent Falls    . Reduce sugar intake to X grams per day      Depression Screen PHQ 2/9 Scores 06/30/2020 04/24/2018 01/22/2017 03/22/2016 08/07/2015 01/06/2015 04/12/2013  PHQ - 2 Score 0 0 0 0 0 0 0  PHQ- 9 Score 0 - - - - - -    Fall Risk Fall Risk  06/30/2020 04/24/2018 01/22/2017 03/22/2016 08/07/2015  Falls in the past year? 0 No No No No  Number falls in past yr: 0 - - - -  Injury with Fall? 0 - - - -  Risk for fall due to : Medication side effect - - - -  Follow up Falls evaluation completed;Falls prevention discussed - - - -    Any stairs in or around the home? No  If so, are there any without handrails? No  Home free of loose throw rugs in walkways, pet beds, electrical cords, etc? Yes  Adequate lighting in your home to reduce risk of falls? Yes   ASSISTIVE DEVICES UTILIZED TO PREVENT FALLS:  Life alert? No  Use of a cane, walker or w/c? No  Grab bars in the bathroom? No  Shower chair or bench in shower? No  Elevated toilet seat or a handicapped toilet? No     Cognitive Function: MMSE - Mini Mental State Exam 01/22/2017  Not completed: (No Data)     6CIT Screen 06/30/2020  What Year? 0 points  What month? 0 points  What time? 0 points  Count back from 20 0 points  Months in reverse 0 points  Repeat phrase 2 points  Total Score 2    Immunizations Immunization History  Administered Date(s) Administered  . Influenza Split 09/03/2012  . Pneumococcal Conjugate-13  06/06/2015  . Pneumococcal Polysaccharide-23 12/28/2009  .  Td 11/11/2006    TDAP status: Due, Education has been provided regarding the importance of this vaccine. Advised may receive this vaccine at local pharmacy or Health Dept. Aware to provide a copy of the vaccination record if obtained from local pharmacy or Health Dept. Verbalized acceptance and understanding. Flu Vaccine status: Up to date Pneumococcal vaccine status: Up to date Covid-19 vaccine status: Declined, Education has been provided regarding the importance of this vaccine but patient still declined. Advised may receive this vaccine at local pharmacy or Health Dept.or vaccine clinic. Aware to provide a copy of the vaccination record if obtained from local pharmacy or Health Dept. Verbalized acceptance and understanding.  Qualifies for Shingles Vaccine? Yes   Zostavax completed No   Shingrix Completed?: No.    Education has been provided regarding the importance of this vaccine. Patient has been advised to call insurance company to determine out of pocket expense if they have not yet received this vaccine. Advised may also receive vaccine at local pharmacy or Health Dept. Verbalized acceptance and understanding.  Screening Tests Health Maintenance  Topic Date Due  . COVID-19 Vaccine (1) Never done  . TETANUS/TDAP  11/11/2016  . FOOT EXAM  12/23/2019  . OPHTHALMOLOGY EXAM  04/27/2020  . INFLUENZA VACCINE  06/11/2020  . HEMOGLOBIN A1C  11/09/2020  . COLONOSCOPY  07/25/2027  . DEXA SCAN  Completed  . Hepatitis C Screening  Completed  . PNA vac Low Risk Adult  Completed    Health Maintenance  Health Maintenance Due  Topic Date Due  . COVID-19 Vaccine (1) Never done  . TETANUS/TDAP  11/11/2016  . FOOT EXAM  12/23/2019  . OPHTHALMOLOGY EXAM  04/27/2020  . INFLUENZA VACCINE  06/11/2020    Colorectal cancer screening: Completed 07/22/2017. Repeat every 10 years Mammogram status: Completed 09/21/2019. Repeat every  year Bone Density status: Ordered 06/30/2020. Pt provided with contact info and advised to call to schedule appt.  Lung Cancer Screening: (Low Dose CT Chest recommended if Age 55-80 years, 30 pack-year currently smoking OR have quit w/in 15years.) does not qualify.   Lung Cancer Screening Referral: N/A  Additional Screening:  Hepatitis C Screening: does qualify; Completed 06/17/2018  Vision Screening: Recommended annual ophthalmology exams for early detection of glaucoma and other disorders of the eye. Is the patient up to date with their annual eye exam?  Yes  Who is the provider or what is the name of the office in which the patient attends annual eye exams? Dr. Marica Otter  If pt is not established with a provider, would they like to be referred to a provider to establish care? No .   Dental Screening: Recommended annual dental exams for proper oral hygiene  Community Resource Referral / Chronic Care Management: CRR required this visit?  No   CCM required this visit?  No      Plan:     I have personally reviewed and noted the following in the patient's chart:   . Medical and social history . Use of alcohol, tobacco or illicit drugs  . Current medications and supplements . Functional ability and status . Nutritional status . Physical activity . Advanced directives . List of other physicians . Hospitalizations, surgeries, and ER visits in previous 12 months . Vitals . Screenings to include cognitive, depression, and falls . Referrals and appointments  In addition, I have reviewed and discussed with patient certain preventive protocols, quality metrics, and best practice recommendations. A written personalized care plan for preventive services  as well as general preventive health recommendations were provided to patient.     Ofilia Neas, LPN   08/04/1550   Nurse Notes: None   I have collaborated with the care management provider regarding care management and care  coordination activities outlined in this encounter and have reviewed this encounter including documentation in the note and care plan. I am certifying that I agree with the content of this note and encounter as supervising physician.  [name], NP

## 2020-07-10 ENCOUNTER — Other Ambulatory Visit: Payer: Self-pay | Admitting: Adult Health

## 2020-07-10 DIAGNOSIS — Z1231 Encounter for screening mammogram for malignant neoplasm of breast: Secondary | ICD-10-CM

## 2020-07-10 DIAGNOSIS — E2839 Other primary ovarian failure: Secondary | ICD-10-CM

## 2020-07-28 ENCOUNTER — Other Ambulatory Visit: Payer: Self-pay | Admitting: Adult Health

## 2020-07-28 ENCOUNTER — Other Ambulatory Visit: Payer: Self-pay | Admitting: Endocrinology

## 2020-07-28 DIAGNOSIS — I10 Essential (primary) hypertension: Secondary | ICD-10-CM

## 2020-08-16 ENCOUNTER — Other Ambulatory Visit: Payer: Self-pay

## 2020-08-17 ENCOUNTER — Ambulatory Visit (INDEPENDENT_AMBULATORY_CARE_PROVIDER_SITE_OTHER): Payer: Medicare Other | Admitting: Adult Health

## 2020-08-17 ENCOUNTER — Encounter: Payer: Self-pay | Admitting: Adult Health

## 2020-08-17 VITALS — BP 128/84 | HR 66 | Temp 98.3°F | Ht 62.0 in | Wt 156.0 lb

## 2020-08-17 DIAGNOSIS — E785 Hyperlipidemia, unspecified: Secondary | ICD-10-CM | POA: Diagnosis not present

## 2020-08-17 DIAGNOSIS — E1165 Type 2 diabetes mellitus with hyperglycemia: Secondary | ICD-10-CM

## 2020-08-17 DIAGNOSIS — I10 Essential (primary) hypertension: Secondary | ICD-10-CM

## 2020-08-17 NOTE — Patient Instructions (Signed)
It was great seeing you today   We will follow up with you regarding your blood work   I do recommend you get the pfizer or moderna covid vaccination

## 2020-08-17 NOTE — Progress Notes (Signed)
Subjective:    Patient ID: Pam Obrien, female    DOB: [birth year], 74 y.o.   MRN: [id number]  HPI Patient presents for yearly preventative medicine examination. She is a pleasant 74 year old female who  has a past medical history of Allergy, Anemia, Arthritis, Diabetes mellitus without complication ([place]), Hyperlipidemia, and Hypertension.  DM -is followed by endocrinology.  She is managed with Metformin 500 mg ER BID and Amaryl 4 mg. She reports blood sugar readings between 80-150 Lab Results  Component Value Date   HGBA1C 6.8 (A) 05/10/2020   Hypertension-is controlled with one half tab of atenolol/hydrochlorothiazide.  She denies dizziness, lightheadedness, or syncopal episodes BP Readings from Last 3 Encounters:  08/17/20 128/84  05/10/20 124/72  02/07/20 140/80   Hyperlipidemia-is prescribed simvastatin 40 mg by endocrinology. She denies myalgia for fatigue Lab Results  Component Value Date   CHOL 199 05/10/2020   HDL 48.80 05/10/2020   LDLCALC 131 (H) 05/10/2020   LDLDIRECT 98.0 12/02/2019   TRIG 99.0 05/10/2020   CHOLHDL 4 05/10/2020    GERD-controlled with Prilosec  All immunizations and health maintenance protocols were reviewed with the patient and needed orders were placed.  Appropriate screening laboratory values were ordered for the patient including screening of hyperlipidemia, renal function and hepatic function.  Medication reconciliation,  past medical history, social history, problem list and allergies were reviewed in detail with the patient  Goals were established with regard to weight loss, exercise, and  diet in compliance with medications  End of life planning was discussed.  She is scheduled for her mammogram and bone density screening in December 2021   Review of Systems  Constitutional: Negative.   HENT: Negative.   Eyes: Negative.   Respiratory: Negative.   Cardiovascular: Negative.   Gastrointestinal: Negative.   Endocrine:  Negative.   Genitourinary: Negative.   Musculoskeletal: Negative.   Skin: Negative.   Allergic/Immunologic: Negative.   Neurological: Negative.   Hematological: Negative.   Psychiatric/Behavioral: Negative.    Past Medical History:  Diagnosis Date  . Allergy   . Anemia    as a young adult  . Arthritis   . Diabetes mellitus without complication ([place])   . Hyperlipidemia    was normal range 05/2017 with PCP  . Hypertension     Social History   Socioeconomic History  . Marital status: Married    Spouse name: Not on file  . Number of children: Not on file  . Years of education: Not on file  . Highest education level: Not on file  Occupational History  . Not on file  Tobacco Use  . Smoking status: Never Smoker  . Smokeless tobacco: Never Used  Vaping Use  . Vaping Use: Never used  Substance and Sexual Activity  . Alcohol use: No  . Drug use: No  . Sexual activity: Not on file  Other Topics Concern  . Not on file  Social History Narrative   Retired from [employer] work    Separated for 9 years.    Five children, two live locally, three are in [place]   One of her daughters live with her   Has a dog.    She likes to dance and walk   Social Determinants of Health   Financial Resource Strain: Low Risk   . Difficulty of Paying Living Expenses: Not hard at all  Food Insecurity: No Food Insecurity  . Worried About Charity fundraiser in the Last Year: Never true  .  Ran Out of Food in the Last Year: Never true  Transportation Needs: No Transportation Needs  . Lack of Transportation (Medical): No  . Lack of Transportation (Non-Medical): No  Physical Activity: Insufficiently Active  . Days of Exercise per Week: 3 days  . Minutes of Exercise per Session: 30 min  Stress: No Stress Concern Present  . Feeling of Stress : Not at all  Social Connections: Moderately Isolated  . Frequency of Communication with Friends and Family: More than three times a week  . Frequency of  Social Gatherings with Friends and Family: More than three times a week  . Attends Religious Services: 1 to 4 times per year  . Active Member of Clubs or Organizations: No  . Attends Archivist Meetings: Never  . Marital Status: Divorced  Human resources officer Violence: Not At Risk  . Fear of Current or Ex-Partner: No  . Emotionally Abused: No  . Physically Abused: No  . Sexually Abused: No    Past Surgical History:  Procedure Laterality Date  . ABDOMINAL HYSTERECTOMY  1994  . COLONOSCOPY  2008  . FLEXIBLE SIGMOIDOSCOPY  2003  . POLYPECTOMY      Family History  Problem Relation Age of Onset  . Hypertension Sister   . Stroke Other   . Diabetes Father   . Hypertension Father   . Stroke Father   . Diabetes Sister   . Diabetes Brother   . Hypertension Brother   . Stroke Brother   . Esophageal cancer Neg Hx   . Rectal cancer Neg Hx   . Stomach cancer Neg Hx   . Colon cancer Neg Hx     Allergies  Allergen Reactions  . Aspirin     REACTION: nervous  . Beef-Derived Products     Sneezing, nasal drainage, throat scratchy  . Celecoxib     REACTION: joints swell  . Milk-Related Compounds     itching  . Mold Extract [Trichophyton]     Pt does not know the reaction.  Positive allergy test per pt.  . Nsaids     REACTION: sweling  . Peanuts [Peanut Oil]     Throat feels scratchy  . Shellfish Allergy     Nasal congestion, throat scratchy, eyes watery  . Sulfamethoxazole     REACTION: urticaria (hives)  . Vioxx [Rofecoxib] Swelling    Current Outpatient Medications on File Prior to Visit  Medication Sig Dispense Refill  . Accu-Chek Softclix Lancets lancets USE AS DIRECTED EVERY DAY 100 each 1  . acetaminophen (TYLENOL) 500 MG tablet Take 1 tablet (500 mg total) by mouth every 6 (six) hours as needed. 30 tablet 0  . atenolol-chlorthalidone (TENORETIC) 50-25 MG tablet TAKE 1/2 TABLET BY MOUTH EVERY DAY 45 tablet 0  . Blood Glucose Monitoring Suppl (ACCU-CHEK AVIVA  PLUS) w/Device KIT Use Accu Chek Aviva Plus to check blood sugar once daily. 1 kit 0  . calcium citrate-vitamin D (CITRACAL+D) 315-200 MG-UNIT per tablet Take 1 tablet by mouth daily.    . cholecalciferol (VITAMIN D) 1000 UNITS tablet Take 2,000 Units by mouth daily.    Marland Kitchen FeFum-FePo-FA-B Cmp-C-Zn-Mn-Cu (SE-TAN PLUS) 162-115.2-1 MG CAPS TAKE 1 CAPSULE BY MOUTH EVERY DAY 90 capsule 3  . glimepiride (AMARYL) 4 MG tablet TAKE 1 TABLET BY MOUTH DAILY AT SUPPER. 90 tablet 1  . glucose blood test strip Use Accu Chek Aviva test strips as instructed to check blood sugar once daily. 100 each 2  . Lancets Misc. (ACCU-CHEK FASTCLIX  LANCET) KIT by Does not apply route. USE LANCET TO CHECK BLOOD SUGAR ONCE DAILY.    Marland Kitchen loratadine (CLARITIN) 10 MG tablet Take 1 tablet (10 mg total) by mouth 2 (two) times daily as needed for allergies. 180 tablet 3  . metFORMIN (GLUCOPHAGE-XR) 500 MG 24 hr tablet TAKE 2 TABLETS EVERY DAY WITH SUPPER 180 tablet 1  . omeprazole (PRILOSEC) 20 MG capsule Take 20 mg by mouth daily.    . simvastatin (ZOCOR) 20 MG tablet TAKE 1 TABLET BY MOUTH EVERY DAY (Patient taking differently: every other day. ) 90 tablet 1  . spironolactone (ALDACTONE) 50 MG tablet TAKE 1 TABLET BY MOUTH EVERY DAY 90 tablet 0   No current facility-administered medications on file prior to visit.    BP 128/84 (BP Location: Left Arm, Patient Position: Sitting, Cuff Size: Normal)   Pulse 66   Temp 98.3 F (36.8 C) (Oral)   Ht '5\' 2"'  (1.575 m)   Wt 156 lb (70.8 kg)   BMI 28.53 kg/m       Objective:   Physical Exam Vitals and nursing note reviewed.  Constitutional:      General: She is not in acute distress.    Appearance: Normal appearance. She is well-developed. She is not ill-appearing.  HENT:     Head: Normocephalic and atraumatic.     Right Ear: Tympanic membrane, ear canal and external ear normal. There is no impacted cerumen.     Left Ear: Tympanic membrane, ear canal and external ear normal.  There is no impacted cerumen.     Nose: Nose normal. No congestion or rhinorrhea.     Mouth/Throat:     Mouth: Mucous membranes are moist.     Pharynx: Oropharynx is clear. No oropharyngeal exudate or posterior oropharyngeal erythema.  Eyes:     General:        Right eye: No discharge.        Left eye: No discharge.     Extraocular Movements: Extraocular movements intact.     Conjunctiva/sclera: Conjunctivae normal.     Pupils: Pupils are equal, round, and reactive to light.  Neck:     Thyroid: No thyromegaly.     Vascular: No carotid bruit.     Trachea: No tracheal deviation.  Cardiovascular:     Rate and Rhythm: Normal rate and regular rhythm.     Pulses: Normal pulses.     Heart sounds: Normal heart sounds. No murmur heard.  No friction rub. No gallop.   Pulmonary:     Effort: Pulmonary effort is normal. No respiratory distress.     Breath sounds: Normal breath sounds. No stridor. No wheezing, rhonchi or rales.  Chest:     Chest wall: No tenderness.  Abdominal:     General: Abdomen is flat. Bowel sounds are normal. There is no distension.     Palpations: Abdomen is soft. There is no mass.     Tenderness: There is no abdominal tenderness. There is no right CVA tenderness, left CVA tenderness, guarding or rebound.     Hernia: No hernia is present.  Musculoskeletal:        General: No swelling, tenderness, deformity or signs of injury. Normal range of motion.     Cervical back: Normal range of motion and neck supple.     Right lower leg: No edema.     Left lower leg: No edema.  Lymphadenopathy:     Cervical: No cervical adenopathy.  Skin:    General:  Skin is warm and dry.     Coloration: Skin is not jaundiced or pale.     Findings: No bruising, erythema, lesion or rash.  Neurological:     General: No focal deficit present.     Mental Status: She is alert and oriented to person, place, and time.     Cranial Nerves: No cranial nerve deficit.     Sensory: No sensory  deficit.     Motor: No weakness.     Coordination: Coordination normal.     Gait: Gait normal.     Deep Tendon Reflexes: Reflexes normal.  Psychiatric:        Mood and Affect: Mood normal.        Behavior: Behavior normal.        Thought Content: Thought content normal.        Judgment: Judgment normal.       Assessment & Plan:  1. Essential hypertension -BP well controlled. No change in medication - CBC with Differential/Platelet; Future - Lipid panel; Future - TSH; Future - CMP with eGFR([facility]); Future  2. Hyperlipidemia, unspecified hyperlipidemia type - Consider increase in statin  - CBC with Differential/Platelet; Future - Lipid panel; Future - TSH; Future - CMP with eGFR([facility]); Future  3. Uncontrolled type 2 diabetes mellitus with hyperglycemia ([place]) - Follow up with Endocrinology as directed - CBC with Differential/Platelet; Future - Lipid panel; Future - TSH; Future - CMP with eGFR([facility]); Future   [name], NP

## 2020-08-17 NOTE — Addendum Note (Signed)
Addended by: Marrion Coy on: 08/17/2020 07:31 AM   Modules accepted: Orders

## 2020-08-18 ENCOUNTER — Other Ambulatory Visit: Payer: Self-pay | Admitting: Adult Health

## 2020-08-18 DIAGNOSIS — E038 Other specified hypothyroidism: Secondary | ICD-10-CM

## 2020-08-18 LAB — COMPLETE METABOLIC PANEL WITH GFR
AG Ratio: 1.6 (calc) (ref 1.0–2.5)
ALT: 9 U/L (ref 6–29)
AST: 14 U/L (ref 10–35)
Albumin: 4.6 g/dL (ref 3.6–5.1)
Alkaline phosphatase (APISO): 75 U/L (ref 37–153)
BUN/Creatinine Ratio: 21 (calc) (ref 6–22)
BUN: 23 mg/dL (ref 7–25)
CO2: 27 mmol/L (ref 20–32)
Calcium: 9.6 mg/dL (ref 8.6–10.4)
Chloride: 104 mmol/L (ref 98–110)
Creat: 1.1 mg/dL — ABNORMAL HIGH (ref 0.60–0.93)
GFR, Est African American: 57 mL/min/{1.73_m2} — ABNORMAL LOW (ref >=60)
GFR, Est Non African American: 49 mL/min/{1.73_m2} — ABNORMAL LOW (ref >=60)
Globulin: 2.9 g/dL (calc) (ref 1.9–3.7)
Glucose, Bld: 126 mg/dL — ABNORMAL HIGH (ref 65–99)
Potassium: 4.7 mmol/L (ref 3.5–5.3)
Sodium: 142 mmol/L (ref 135–146)
Total Bilirubin: 0.3 mg/dL (ref 0.2–1.2)
Total Protein: 7.5 g/dL (ref 6.1–8.1)

## 2020-08-18 LAB — LIPID PANEL
Cholesterol: 212 mg/dL — ABNORMAL HIGH (ref <200)
HDL: 54 mg/dL (ref >=50)
LDL Cholesterol (Calc): 138 mg/dL (calc) — ABNORMAL HIGH
Non-HDL Cholesterol (Calc): 158 mg/dL (calc) — ABNORMAL HIGH (ref <130)
Total CHOL/HDL Ratio: 3.9 (calc) (ref <5.0)
Triglycerides: 101 mg/dL (ref <150)

## 2020-08-18 LAB — CBC WITH DIFFERENTIAL/PLATELET
Absolute Monocytes: 669 cells/uL (ref 200–950)
Basophils Absolute: 18 cells/uL (ref 0–200)
Basophils Relative: 0.2 %
Eosinophils Absolute: 176 cells/uL (ref 15–500)
Eosinophils Relative: 2 %
HCT: 35.4 % (ref 35.0–45.0)
Hemoglobin: 11.2 g/dL — ABNORMAL LOW (ref 11.7–15.5)
Lymphs Abs: 3582 cells/uL (ref 850–3900)
MCH: 26.6 pg — ABNORMAL LOW (ref 27.0–33.0)
MCHC: 31.6 g/dL — ABNORMAL LOW (ref 32.0–36.0)
MCV: 84.1 fL (ref 80.0–100.0)
MPV: 11 fL (ref 7.5–12.5)
Monocytes Relative: 7.6 %
Neutro Abs: 4356 cells/uL (ref 1500–7800)
Neutrophils Relative %: 49.5 %
Platelets: 259 10*3/uL (ref 140–400)
RBC: 4.21 10*6/uL (ref 3.80–5.10)
RDW: 12.3 % (ref 11.0–15.0)
Total Lymphocyte: 40.7 %
WBC: 8.8 10*3/uL (ref 3.8–10.8)

## 2020-08-18 LAB — TSH: TSH: 5.54 mIU/L — ABNORMAL HIGH (ref 0.40–4.50)

## 2020-08-22 MED ORDER — ROSUVASTATIN CALCIUM 40 MG PO TABS: 40.0000 mg | ORAL_TABLET | Freq: Every day | ORAL | 0 refills | Status: DC

## 2020-08-22 NOTE — Addendum Note (Signed)
Addended by: Agnes Lawrence on: 08/22/2020 11:35 AM   Modules accepted: Orders

## 2020-09-06 ENCOUNTER — Other Ambulatory Visit (INDEPENDENT_AMBULATORY_CARE_PROVIDER_SITE_OTHER): Payer: Medicare Other

## 2020-09-06 ENCOUNTER — Other Ambulatory Visit: Payer: Self-pay

## 2020-09-06 DIAGNOSIS — E1165 Type 2 diabetes mellitus with hyperglycemia: Secondary | ICD-10-CM | POA: Diagnosis not present

## 2020-09-06 LAB — BASIC METABOLIC PANEL
BUN: 22 mg/dL (ref 6–23)
CO2: 27 mEq/L (ref 19–32)
Calcium: 8.8 mg/dL (ref 8.4–10.5)
Chloride: 100 mEq/L (ref 96–112)
Creatinine, Ser: 1.21 mg/dL — ABNORMAL HIGH (ref 0.40–1.20)
GFR: 43.75 mL/min — ABNORMAL LOW (ref >60.00)
Glucose, Bld: 193 mg/dL — ABNORMAL HIGH (ref 70–99)
Potassium: 4.3 mEq/L (ref 3.5–5.1)
Sodium: 139 mEq/L (ref 135–145)

## 2020-09-06 LAB — MICROALBUMIN / CREATININE URINE RATIO
Creatinine,U: 216.5 mg/dL
Microalb Creat Ratio: 0.6 mg/g (ref 0.0–30.0)
Microalb, Ur: 1.4 mg/dL (ref 0.0–1.9)

## 2020-09-06 LAB — HEMOGLOBIN A1C: Hgb A1c MFr Bld: 6.8 % — ABNORMAL HIGH (ref 4.6–6.5)

## 2020-09-13 ENCOUNTER — Ambulatory Visit (INDEPENDENT_AMBULATORY_CARE_PROVIDER_SITE_OTHER): Payer: Medicare Other | Admitting: Endocrinology

## 2020-09-13 ENCOUNTER — Encounter: Payer: Self-pay | Admitting: Endocrinology

## 2020-09-13 ENCOUNTER — Other Ambulatory Visit: Payer: Self-pay

## 2020-09-13 VITALS — BP 120/82 | HR 82 | Ht 62.0 in | Wt 153.6 lb

## 2020-09-13 DIAGNOSIS — E7800 Pure hypercholesterolemia, unspecified: Secondary | ICD-10-CM

## 2020-09-13 DIAGNOSIS — E1165 Type 2 diabetes mellitus with hyperglycemia: Secondary | ICD-10-CM

## 2020-09-13 DIAGNOSIS — E063 Autoimmune thyroiditis: Secondary | ICD-10-CM | POA: Diagnosis not present

## 2020-09-13 MED ORDER — LEVOTHYROXINE SODIUM 25 MCG PO TABS: 25.0000 ug | ORAL_TABLET | Freq: Every day | ORAL | 3 refills | Status: DC

## 2020-09-13 NOTE — Progress Notes (Signed)
Patient ID: Pam Obrien, female   DOB: [birth year], 74 y.o.   MRN: [id number]     Reason for Appointment: Followup for Type 2 Diabetes   History of Present Illness:          Diagnosis: Type 2 diabetes mellitus, date of diagnosis:  4/13      Past history:  She was initially started on metformin but he thinks that this made her sleepy and had some abdominal discomfort   She also has been tried on other oral hypoglycemic regimens including Jentadueto  since 2014  But she had similar side effects with this  And not clear how regularly she had been taking this until it was discontinued in 5/15  A1c appears to be persistently over 8% Since 2014  She was tried on Invokamet by her PCP but because of excessive urination with this and also some nausea and weakness it was stopped With glyburide alone her blood sugars are poorly controlled and she was referred here for further management She was given Januvia but she did not take this because of expense of the medication   She was referred to the nurse educator and started on basal insulin to improve her control on 06/07/14 This was changed to premixed insulin in 9/15 because of continued poor control and her not being able to afford NovoLog mix insulin or Invokana  She stopped taking insulin in 1/16 because of the cost and was taking only Amaryl.  Recent history:   Hypoglycemic drugs: Glimepiride 4 mg at supper, metformin ER 500 mg in a.m. and pm    Her A1c is again 6.8, has been as low as 6.4  Current management, blood sugar patterns and problems identified:  Medications have been continued unchanged  She was not able to do any exercise and she is doing a little chair exercises with weights at home only  As before she is checking blood sugar infrequently and generally late morning possibly before her first meal of the day  No readings after meals and at 71 low normal sugar of 69 before dinner  She usually refuses to  consider brand-name medications   Glucose monitoring with Accu-Chek meter    PRE-MEAL Fasting  midday Dinner Bedtime Overall  Glucose range: 136  124-156  69-124    Mean/median:      123   Previous readings:  PRE-MEAL Fasting Lunch  afternoon Bedtime Overall  Glucose range:   108-163  86-151    Mean/median:   132  127   129     Side effects from medications have been: Metformin higher doses will cause abdominal discomfort  Self-care: The diet that the patient has been following is: tries to limit  portions      Meals:  usually 2-3 meals per day. Usually has variable intake in the morning, sometimes toast/fruit, usually has carbohydrates at dinner time, will have fruit for snacks        Dietician visit: Most recent: 6/17 .              CDE visit last in 7/15  Wt Readings from Last 3 Encounters:  09/13/20 153 lb 9.6 oz (69.7 kg)  08/17/20 156 lb (70.8 kg)  05/10/20 157 lb (71.2 kg)       Glycemic control:   Lab Results  Component Value Date   HGBA1C 6.8 (H) 09/06/2020   HGBA1C 6.8 (A) 05/10/2020   HGBA1C 7.3 (H) 12/02/2019   Lab Results  Component  Value Date   MICROALBUR 1.4 09/06/2020   LDLCALC 138 (H) 08/17/2020   CREATININE 1.21 (H) 09/06/2020    No visits with results within 1 Week(s) from this visit.  Latest known visit with results is:  Lab on 09/06/2020  Component Date Value Ref Range Status  . Sodium 09/06/2020 139  135 - 145 mEq/L Final  . Potassium 09/06/2020 4.3  3.5 - 5.1 mEq/L Final  . Chloride 09/06/2020 100  96 - 112 mEq/L Final  . CO2 09/06/2020 27  19 - 32 mEq/L Final  . Glucose, Bld 09/06/2020 193* 70 - 99 mg/dL Final  . BUN 09/06/2020 22  6 - 23 mg/dL Final  . Creatinine, Ser 09/06/2020 1.21* 0.40 - 1.20 mg/dL Final  . GFR 09/06/2020 43.75* >60.00 mL/min Final   Calculated using the CKD-EPI Creatinine Equation (2021)  . Calcium 09/06/2020 8.8  8.4 - 10.5 mg/dL Final  . Hgb A1c MFr Bld 09/06/2020 6.8* 4.6 - 6.5 % Final   Glycemic Control  Guidelines for People with Diabetes:Non Diabetic:  <6%Goal of Therapy: <7%Additional Action Suggested:  >8%   . Microalb, Ur 09/06/2020 1.4  0.0 - 1.9 mg/dL Final  . Creatinine,U 09/06/2020 216.5  mg/dL Final  . Microalb Creat Ratio 09/06/2020 0.6  0.0 - 30.0 mg/g Final     Weight history:  Previous range upto 172   Wt Readings from Last 3 Encounters:  09/13/20 153 lb 9.6 oz (69.7 kg)  08/17/20 156 lb (70.8 kg)  05/10/20 157 lb (71.2 kg)    Allergies as of 09/13/2020      Reactions   Aspirin    REACTION: nervous   Beef-derived Products    Sneezing, nasal drainage, throat scratchy   Celecoxib    REACTION: joints swell   Milk-related Compounds    itching   Mold Extract [trichophyton]    Pt does not know the reaction.  Positive allergy test per pt.   Nsaids    REACTION: sweling   Peanuts [peanut Oil]    Throat feels scratchy   Shellfish Allergy    Nasal congestion, throat scratchy, eyes watery   Sulfamethoxazole    REACTION: urticaria (hives)   Vioxx [rofecoxib] Swelling      Medication List       Accurate as of September 13, 2020  1:44 PM. If you have any questions, ask your nurse or doctor.        Accu-Chek Aviva Plus w/Device Kit Use Accu Chek Aviva Plus to check blood sugar once daily.   Accu-Chek [employer] Kit by Does not apply route. USE LANCET TO CHECK BLOOD SUGAR ONCE DAILY.   Accu-Chek Softclix Lancets lancets USE AS DIRECTED EVERY DAY   acetaminophen 500 MG tablet Commonly known as: TYLENOL Take 1 tablet (500 mg total) by mouth every 6 (six) hours as needed.   atenolol-chlorthalidone 50-25 MG tablet Commonly known as: TENORETIC TAKE 1/2 TABLET BY MOUTH EVERY DAY   calcium citrate-vitamin D 315-200 MG-UNIT tablet Commonly known as: CITRACAL+D Take 1 tablet by mouth daily.   cholecalciferol 1000 units tablet Commonly known as: VITAMIN D Take 2,000 Units by mouth daily.   glimepiride 4 MG tablet Commonly known as: AMARYL TAKE 1 TABLET BY  MOUTH DAILY AT SUPPER.   glucose blood test strip Use Accu Chek Aviva test strips as instructed to check blood sugar once daily.   loratadine 10 MG tablet Commonly known as: CLARITIN Take 1 tablet (10 mg total) by mouth 2 (two) times daily as  needed for allergies.   metFORMIN 500 MG 24 hr tablet Commonly known as: GLUCOPHAGE-XR TAKE 2 TABLETS EVERY DAY WITH SUPPER   omeprazole 20 MG capsule Commonly known as: PRILOSEC Take 20 mg by mouth daily.   rosuvastatin 40 MG tablet Commonly known as: Crestor Take 1 tablet (40 mg total) by mouth daily.   Se-Tan PLUS 162-115.2-1 MG Caps TAKE 1 CAPSULE BY MOUTH EVERY DAY   simvastatin 20 MG tablet Commonly known as: ZOCOR TAKE 1 TABLET BY MOUTH EVERY DAY What changed:   when to take this  additional instructions   spironolactone 50 MG tablet Commonly known as: ALDACTONE TAKE 1 TABLET BY MOUTH EVERY DAY       Allergies:  Allergies  Allergen Reactions  . Aspirin     REACTION: nervous  . Beef-Derived Products     Sneezing, nasal drainage, throat scratchy  . Celecoxib     REACTION: joints swell  . Milk-Related Compounds     itching  . Mold Extract [Trichophyton]     Pt does not know the reaction.  Positive allergy test per pt.  . Nsaids     REACTION: sweling  . Peanuts [Peanut Oil]     Throat feels scratchy  . Shellfish Allergy     Nasal congestion, throat scratchy, eyes watery  . Sulfamethoxazole     REACTION: urticaria (hives)  . Vioxx [Rofecoxib] Swelling    Past Medical History:  Diagnosis Date  . Allergy   . Anemia    as a young adult  . Arthritis   . Diabetes mellitus without complication ([place])   . Hyperlipidemia    was normal range 05/2017 with PCP  . Hypertension     Past Surgical History:  Procedure Laterality Date  . ABDOMINAL HYSTERECTOMY  1994  . COLONOSCOPY  2008  . FLEXIBLE SIGMOIDOSCOPY  2003  . POLYPECTOMY      Family History  Problem Relation Age of Onset  . Hypertension Sister   .  Stroke Other   . Diabetes Father   . Hypertension Father   . Stroke Father   . Diabetes Sister   . Diabetes Brother   . Hypertension Brother   . Stroke Brother   . Esophageal cancer Neg Hx   . Rectal cancer Neg Hx   . Stomach cancer Neg Hx   . Colon cancer Neg Hx     Social History:  reports that she has never smoked. She has never used smokeless tobacco. She reports that she does not drink alcohol and does not use drugs.    Review of Systems    HYPERTENSION: She takes a half a tablet of atenolol/chlorthalidone along with half tablet of spironolactone 50 mg Does not check blood pressure at home  BP Readings from Last 3 Encounters:  09/13/20 120/82  08/17/20 128/84  05/10/20 124/72    HYPOKALEMIA: This is secondary to HCTZ controlled with Aldactone Taking a half of a 50 mg tablet daily  Lab Results  Component Value Date   K 4.3 09/06/2020     Lab Results  Component Value Date   CREATININE 1.21 (H) 09/06/2020   CREATININE 1.10 (H) 08/17/2020   CREATININE 1.23 (H) 05/10/2020      Lipids: She did not take pravastatin previously prescribed because of cost Started simvastatin in 7/19 She does not like this because it causes some aching in her right arm and chest and takes it only every other day Since LDL was high in June her PCP  has started her on Crestor  However she has not picked up this prescription because of the cost LDL still is high  She does have family history of CVA but no known history of CAD    Lab Results  Component Value Date   CHOL 212 (H) 08/17/2020   CHOL 199 05/10/2020   CHOL 167 02/07/2020   Lab Results  Component Value Date   HDL 54 08/17/2020   HDL 48.80 05/10/2020   HDL 50.80 02/07/2020   Lab Results  Component Value Date   LDLCALC 138 (H) 08/17/2020   LDLCALC 131 (H) 05/10/2020   LDLCALC 88 02/07/2020   Lab Results  Component Value Date   TRIG 101 08/17/2020   TRIG 99.0 05/10/2020   TRIG 138.0 02/07/2020   Lab Results   Component Value Date   CHOLHDL 3.9 08/17/2020   CHOLHDL 4 05/10/2020   CHOLHDL 3 02/07/2020   Lab Results  Component Value Date   LDLDIRECT 98.0 12/02/2019   LDLDIRECT 130.0 05/17/2016   LDLDIRECT 121.7 03/04/2012     Last foot exam was in 12/2018, she does not complain of any  symptoms of neuropathy  She has had routine thyroid tests and TSH has been progressively higher She does complain of fatigue  Lab Results  Component Value Date   TSH 5.54 (H) 08/17/2020   TSH 4.43 06/22/2019   TSH 3.77 06/17/2018   FREET4 0.9 06/02/2009      Physical Examination:  BP 120/82   Pulse 82   Ht _0  (1.575 m)   Wt 153 lb 9.6 oz (69.7 kg)   SpO2 99%   BMI 28.09 kg/m   Thyroid not palpable Reflexes normal    ASSESSMENT:  Diabetes type 2   See history of present illness for detailed discussion of  current management, blood sugar patterns and problems identified  A1c is 6.8 compared to 7.3  Blood sugars are being checked very infrequently at home and mostly before her first meal at midday She is not walking or exercising However considering her age and comorbid conditions her level of control is reasonably good   LIPIDS: She has taken simvastatin only every other day, this appears to be inadequate and she is not switching to Crestor as directed because of cost  History of hypokalemia: Controlled with Aldactone and will continue the 25 mg dose  Blood pressure is well controlled with her regimen of atenolol/chlorthalidone follow-up with spironolactone  Hypothyroidism: Difficult to know whether her fatigue is related to hypothyroidism or not and may benefit from a trial of low-dose supplement since her TSH is trending higher  PLAN:    She needs to start checking blood sugars by rotation after meals especially in the evening after supper  She will need to do more chair exercises are [place] walking [place] the mall  To call if blood sugars are consistently high  Encouraged  her to switch to Crestor 40 mg as prescribed and take this every other day to start with  Start levothyroxine 25 mcg daily as a trial and if she feels significantly better she can continue    There are no Patient Instructions on file for this visit.      Elayne Snare 09/13/2020, 1:44 PM   Note: This office note was prepared with Dragon voice recognition system technology. Any transcriptional errors that result from this process are unintentional.

## 2020-09-13 NOTE — Patient Instructions (Addendum)
Check blood sugars on waking up days a week  Also check blood sugars about 2 hours after meals and do this after different meals by rotation  Recommended blood sugar levels on waking up are 90-130 and about 2 hours after meal is 130-160  Please bring your blood sugar monitor to each visit, thank you  Take Rosuvastatin   Thyoid pill in am

## 2020-09-22 ENCOUNTER — Other Ambulatory Visit: Payer: Medicare Other

## 2020-10-01 ENCOUNTER — Other Ambulatory Visit: Payer: Self-pay

## 2020-10-01 ENCOUNTER — Ambulatory Visit (HOSPITAL_COMMUNITY)
Admission: EM | Admit: 2020-10-01 | Discharge: 2020-10-01 | Disposition: A | Discharge status: Home / Self Care | Payer: Medicare Other | Attending: Urgent Care | Admitting: Urgent Care

## 2020-10-01 ENCOUNTER — Encounter (HOSPITAL_COMMUNITY): Payer: Self-pay

## 2020-10-01 DIAGNOSIS — L299 Pruritus, unspecified: Secondary | ICD-10-CM | POA: Diagnosis not present

## 2020-10-01 DIAGNOSIS — E119 Type 2 diabetes mellitus without complications: Secondary | ICD-10-CM

## 2020-10-01 MED ORDER — HYDROXYZINE HCL 25 MG PO TABS: 12.5000 mg | ORAL_TABLET | Freq: Two times a day (BID) | ORAL | 0 refills | Status: DC | PRN

## 2020-10-01 MED ORDER — CLOTRIMAZOLE-BETAMETHASONE 1-0.05 % EX CREA: TOPICAL_CREAM | CUTANEOUS | 0 refills | Status: DC

## 2020-10-01 NOTE — ED Provider Notes (Signed)
[facility]   MRN: [id number] DOB: Apr 14, 1946  Subjective:   Pam Obrien is a 74 y.o. female presenting for 2-day history of acute onset of itching of her left foot. Patient states that she has been trying to use a lotion, icing actually rubbed alcohol as well to help with her symptoms. The only thing that helped was the icing. Denies any kind of trauma, pain, drainage of pus or bleeding, rash. She does report that her skin is dry. She is a well-controlled diabetic.  No current facility-administered medications for this encounter.  Current Outpatient Medications:  .  Accu-Chek Softclix Lancets lancets, USE AS DIRECTED EVERY DAY, Disp: 100 each, Rfl: 1 .  acetaminophen (TYLENOL) 500 MG tablet, Take 1 tablet (500 mg total) by mouth every 6 (six) hours as needed., Disp: 30 tablet, Rfl: 0 .  atenolol-chlorthalidone (TENORETIC) 50-25 MG tablet, TAKE 1/2 TABLET BY MOUTH EVERY DAY, Disp: 45 tablet, Rfl: 0 .  Blood Glucose Monitoring Suppl (ACCU-CHEK AVIVA PLUS) w/Device KIT, Use Accu Chek Aviva Plus to check blood sugar once daily., Disp: 1 kit, Rfl: 0 .  calcium citrate-vitamin D (CITRACAL+D) 315-200 MG-UNIT per tablet, Take 1 tablet by mouth daily., Disp: , Rfl:  .  cholecalciferol (VITAMIN D) 1000 UNITS tablet, Take 2,000 Units by mouth daily., Disp: , Rfl:  .  FeFum-FePo-FA-B Cmp-C-Zn-Mn-Cu (SE-TAN PLUS) 162-115.2-1 MG CAPS, TAKE 1 CAPSULE BY MOUTH EVERY DAY, Disp: 90 capsule, Rfl: 3 .  glimepiride (AMARYL) 4 MG tablet, TAKE 1 TABLET BY MOUTH DAILY AT SUPPER., Disp: 90 tablet, Rfl: 1 .  glucose blood test strip, Use Accu Chek Aviva test strips as instructed to check blood sugar once daily., Disp: 100 each, Rfl: 2 .  Lancets Misc. (ACCU-CHEK FASTCLIX LANCET) KIT, by Does not apply route. USE LANCET TO CHECK BLOOD SUGAR ONCE DAILY., Disp: , Rfl:  .  levothyroxine (SYNTHROID) 25 MCG tablet, Take 1 tablet (25 mcg total) by mouth daily before breakfast., Disp: 30 tablet, Rfl:  3 .  loratadine (CLARITIN) 10 MG tablet, Take 1 tablet (10 mg total) by mouth 2 (two) times daily as needed for allergies., Disp: 180 tablet, Rfl: 3 .  metFORMIN (GLUCOPHAGE-XR) 500 MG 24 hr tablet, TAKE 2 TABLETS EVERY DAY WITH SUPPER, Disp: 180 tablet, Rfl: 1 .  omeprazole (PRILOSEC) 20 MG capsule, Take 20 mg by mouth daily., Disp: , Rfl:  .  rosuvastatin (CRESTOR) 40 MG tablet, Take 1 tablet (40 mg total) by mouth daily., Disp: 90 tablet, Rfl: 0 .  simvastatin (ZOCOR) 20 MG tablet, TAKE 1 TABLET BY MOUTH EVERY DAY (Patient taking differently: every other day. ), Disp: 90 tablet, Rfl: 1 .  spironolactone (ALDACTONE) 50 MG tablet, TAKE 1 TABLET BY MOUTH EVERY DAY, Disp: 90 tablet, Rfl: 0   Allergies  Allergen Reactions  . Aspirin     REACTION: nervous  . Beef-Derived Products     Sneezing, nasal drainage, throat scratchy  . Celecoxib     REACTION: joints swell  . Milk-Related Compounds     itching  . Mold Extract [Trichophyton]     Pt does not know the reaction.  Positive allergy test per pt.  . Nsaids     REACTION: sweling  . Peanuts [Peanut Oil]     Throat feels scratchy  . Shellfish Allergy     Nasal congestion, throat scratchy, eyes watery  . Sulfamethoxazole     REACTION: urticaria (hives)  . Vioxx [Rofecoxib] Swelling    Past  Medical History:  Diagnosis Date  . Allergy   . Anemia    as a young adult  . Arthritis   . Diabetes mellitus without complication ([place])   . Hyperlipidemia    was normal range 05/2017 with PCP  . Hypertension      Past Surgical History:  Procedure Laterality Date  . ABDOMINAL HYSTERECTOMY  1994  . COLONOSCOPY  2008  . FLEXIBLE SIGMOIDOSCOPY  2003  . POLYPECTOMY      Family History  Problem Relation Age of Onset  . Hypertension Sister   . Stroke Other   . Diabetes Father   . Hypertension Father   . Stroke Father   . Diabetes Sister   . Diabetes Brother   . Hypertension Brother   . Stroke Brother   . Esophageal cancer Neg Hx   .  Rectal cancer Neg Hx   . Stomach cancer Neg Hx   . Colon cancer Neg Hx     Social History   Tobacco Use  . Smoking status: Never Smoker  . Smokeless tobacco: Never Used  Vaping Use  . Vaping Use: Never used  Substance Use Topics  . Alcohol use: No  . Drug use: No    ROS   Objective:   Vitals: BP (!) 137/92 (BP Location: Left Arm)   Pulse 91   Temp 97.9 F (36.6 C) (Oral)   Resp 18   SpO2 100%   Physical Exam Constitutional:      General: She is not in acute distress.    Appearance: Normal appearance. She is well-developed. She is not ill-appearing, toxic-appearing or diaphoretic.  HENT:     Head: Normocephalic and atraumatic.     Nose: Nose normal.     Mouth/Throat:     Mouth: Mucous membranes are moist.     Pharynx: Oropharynx is clear.  Eyes:     General: No scleral icterus.       Right eye: No discharge.        Left eye: No discharge.     Extraocular Movements: Extraocular movements intact.     Conjunctiva/sclera: Conjunctivae normal.     Pupils: Pupils are equal, round, and reactive to light.  Cardiovascular:     Rate and Rhythm: Normal rate.  Pulmonary:     Effort: Pulmonary effort is normal.  Musculoskeletal:     Comments: Left foot with dry skin. No tenderness, erythema, warmth, discharge, laceration. Interspaces in between her toes are also unremarkable. Patient has full range of motion.  Skin:    General: Skin is warm and dry.  Neurological:     General: No focal deficit present.     Mental Status: She is alert and oriented to person, place, and time.  Psychiatric:        Mood and Affect: Mood normal.        Behavior: Behavior normal.        Thought Content: Thought content normal.        Judgment: Judgment normal.      Assessment and Plan :   PDMP not reviewed this encounter.  1. Itching   2. Well controlled diabetes mellitus ([place])     Exam benign. Recommended conservative management with clotrimazole-betamethasone cream x2 weeks  for developing tinea pedis, irritant dry skin. Counseled patient on potential for adverse effects with medications prescribed/recommended today, ER and return-to-clinic precautions discussed, patient verbalized understanding.    Jaynee Eagles, PA-C 10/01/20 1132

## 2020-10-01 NOTE — ED Triage Notes (Signed)
Pt reports having itchiness and pain in the left foot x 2 days.Pt reports she is diabetic. Pt rubbed alcohol and [employer] lotion without relief.

## 2020-10-04 ENCOUNTER — Ambulatory Visit (HOSPITAL_COMMUNITY)
Admission: EM | Admit: 2020-10-04 | Discharge: 2020-10-04 | Disposition: A | Discharge status: Home / Self Care | Payer: Medicare Other | Attending: Family Medicine | Admitting: Family Medicine

## 2020-10-04 ENCOUNTER — Encounter (HOSPITAL_COMMUNITY): Payer: Self-pay

## 2020-10-04 ENCOUNTER — Other Ambulatory Visit: Payer: Self-pay

## 2020-10-04 DIAGNOSIS — M545 Low back pain, unspecified: Secondary | ICD-10-CM | POA: Diagnosis not present

## 2020-10-04 LAB — POCT URINALYSIS DIPSTICK, ED / UC
Glucose, UA: NEGATIVE mg/dL
Hgb urine dipstick: NEGATIVE
Ketones, ur: NEGATIVE mg/dL
Nitrite: NEGATIVE
Protein, ur: NEGATIVE mg/dL
Specific Gravity, Urine: 1.02 (ref 1.005–1.030)
Urobilinogen, UA: 0.2 mg/dL (ref 0.0–1.0)
pH: 5 (ref 5.0–8.0)

## 2020-10-04 MED ORDER — LIDOCAINE 5 % EX PTCH: 1.0000 | MEDICATED_PATCH | CUTANEOUS | 0 refills | Status: DC

## 2020-10-04 MED ORDER — CYCLOBENZAPRINE HCL 5 MG PO TABS
2.5000 mg | ORAL_TABLET | Freq: Every evening | ORAL | 0 refills | Status: DC | PRN
Start: 2020-10-04 — End: 2021-01-11

## 2020-10-04 NOTE — ED Triage Notes (Signed)
Pt is here with back pain that started Tuesday states it usually goes away, pt has taken Tylenol to relieve discomfort.

## 2020-10-04 NOTE — ED Provider Notes (Signed)
[facility]    CSN: [id number] Arrival date & time: 10/04/20  1019      History   Chief Complaint Chief Complaint  Patient presents with  . Back Pain    HPI Pam Obrien is a 74 y.o. female.   Presenting today with 2 day hx of right mid/lower back pain that came on gradually with no identified trigger. The pain can be sharp but is typically dull and aching. Seems to be relieved some with massage to the area and tylenol but nothing fully takes the pain away. Does have known hx of arthritis with intermittent bouts of back pain but usually that is fully relieved by tylenol. Denies hematuria, dysuria, abdominal pain, N/V/D, constipation, CP, SOB, injury.      Past Medical History:  Diagnosis Date  . Allergy   . Anemia    as a young adult  . Arthritis   . Diabetes mellitus without complication ([place])   . Hyperlipidemia    was normal range 05/2017 with PCP  . Hypertension     Patient Active Problem List   Diagnosis Date Noted  . Hyperlipidemia 07/21/2014  . Type II diabetes mellitus, uncontrolled ([place]) 01/04/2013  . ESOPHAGEAL REFLUX 06/18/2010  . HYPOKALEMIA, MILD 06/02/2009  . Disorder of bone and cartilage 12/11/2007  . COLONIC POLYPS, HX OF 12/11/2007  . Essential hypertension 06/23/2007  . ALLERGIC RHINITIS 06/23/2007  . OSTEOARTHRITIS 06/23/2007    Past Surgical History:  Procedure Laterality Date  . ABDOMINAL HYSTERECTOMY  1994  . COLONOSCOPY  2008  . FLEXIBLE SIGMOIDOSCOPY  2003  . POLYPECTOMY      OB History   No obstetric history on file.      Home Medications    Prior to Admission medications   Medication Sig Start Date End Date Taking? Authorizing Provider  Accu-Chek Softclix Lancets lancets USE AS DIRECTED EVERY DAY 07/31/20   Elayne Snare, MD  acetaminophen (TYLENOL) 500 MG tablet Take 1 tablet (500 mg total) by mouth every 6 (six) hours as needed. 07/30/18   Zigmund Gottron, NP  atenolol-chlorthalidone (TENORETIC) 50-25 MG  tablet TAKE 1/2 TABLET BY MOUTH EVERY DAY 07/28/20   Nafziger, [name], NP  Blood Glucose Monitoring Suppl (ACCU-CHEK AVIVA PLUS) w/Device KIT Use Accu Chek Aviva Plus to check blood sugar once daily. 12/07/19   Elayne Snare, MD  calcium citrate-vitamin D (CITRACAL+D) 315-200 MG-UNIT per tablet Take 1 tablet by mouth daily.    [provider]  cholecalciferol (VITAMIN D) 1000 UNITS tablet Take 2,000 Units by mouth daily.    [provider]  clotrimazole-betamethasone (LOTRISONE) cream Apply to affected area 2 times daily for 2 weeks. 10/01/20   Jaynee Eagles, PA-C  cyclobenzaprine (FLEXERIL) 5 MG tablet Take 0.5-1 tablets (2.5-5 mg total) by mouth at bedtime as needed for muscle spasms. 10/04/20   Volney American, PA-C  FeFum-FePo-FA-B Cmp-C-Zn-Mn-Cu (SE-TAN PLUS) [phone] MG CAPS TAKE 1 CAPSULE BY MOUTH EVERY DAY 11/03/19   Nafziger, [name], NP  glimepiride (AMARYL) 4 MG tablet TAKE 1 TABLET BY MOUTH DAILY AT SUPPER. 07/31/20   Elayne Snare, MD  glucose blood test strip Use Accu Chek Aviva test strips as instructed to check blood sugar once daily. 12/07/19   Elayne Snare, MD  hydrOXYzine (ATARAX/VISTARIL) 25 MG tablet Take 0.5 tablets (12.5 mg total) by mouth 2 (two) times daily as needed for itching. 10/01/20   Jaynee Eagles, PA-C  Lancets Misc. (ACCU-CHEK FASTCLIX LANCET) KIT by Does not apply route. USE LANCET  TO CHECK BLOOD SUGAR ONCE DAILY.    [provider]  levothyroxine (SYNTHROID) 25 MCG tablet Take 1 tablet (25 mcg total) by mouth daily before breakfast. 09/13/20   Elayne Snare, MD  lidocaine (LIDODERM) 5 % Place 1 patch onto the skin daily. Remove & Discard patch within 12 hours or as directed by MD 10/04/20   Volney American, PA-C  loratadine (CLARITIN) 10 MG tablet Take 1 tablet (10 mg total) by mouth 2 (two) times daily as needed for allergies. 03/14/14   Ricard Dillon, MD  metFORMIN (GLUCOPHAGE-XR) 500 MG 24 hr tablet TAKE 2 TABLETS EVERY DAY WITH SUPPER  06/07/20   Elayne Snare, MD  omeprazole (PRILOSEC) 20 MG capsule Take 20 mg by mouth daily.    [provider]  rosuvastatin (CRESTOR) 40 MG tablet Take 1 tablet (40 mg total) by mouth daily. 08/22/20   Nafziger, [name], NP  simvastatin (ZOCOR) 20 MG tablet TAKE 1 TABLET BY MOUTH EVERY DAY Patient taking differently: every other day.  11/01/19   Elayne Snare, MD  spironolactone (ALDACTONE) 50 MG tablet TAKE 1 TABLET BY MOUTH EVERY DAY 07/31/20   Elayne Snare, MD    Family History Family History  Problem Relation Age of Onset  . Hypertension Sister   . Stroke Other   . Diabetes Father   . Hypertension Father   . Stroke Father   . Diabetes Sister   . Diabetes Brother   . Hypertension Brother   . Stroke Brother   . Cancer Mother   . Esophageal cancer Neg Hx   . Rectal cancer Neg Hx   . Stomach cancer Neg Hx   . Colon cancer Neg Hx     Social History Social History   Tobacco Use  . Smoking status: Never Smoker  . Smokeless tobacco: Never Used  Vaping Use  . Vaping Use: Never used  Substance Use Topics  . Alcohol use: No  . Drug use: No     Allergies   Aspirin, Beef-derived products, Celecoxib, Milk-related compounds, Mold extract [trichophyton], Nsaids, Peanuts [peanut oil], Shellfish allergy, Sulfamethoxazole, and Vioxx [rofecoxib]   Review of Systems Review of Systems PER HPI   Physical Exam Triage Vital Signs ED Triage Vitals  Enc Vitals Group     BP 10/04/20 1053 137/64     Pulse Rate 10/04/20 1053 (!) 104     Resp 10/04/20 1053 17     Temp 10/04/20 1053 97.7 F (36.5 C)     Temp Source 10/04/20 1053 Oral     SpO2 10/04/20 1053 100 %     Weight --      Height --      Head Circumference --      Peak Flow --      Pain Score 10/04/20 1051 8     Pain Loc --      Pain Edu? --      Excl. in [place]? --    No data found.  Updated Vital Signs BP 137/64 (BP Location: Right Arm)   Pulse (!) 104   Temp 97.7 F (36.5 C) (Oral)   Resp 17   SpO2 100%    Visual Acuity Right Eye Distance:   Left Eye Distance:   Bilateral Distance:    Right Eye Near:   Left Eye Near:    Bilateral Near:     Physical Exam Vitals and nursing note reviewed.  Constitutional:      Appearance: Normal appearance. She is not  ill-appearing.  HENT:     Head: Atraumatic.     Mouth/Throat:     Mouth: Mucous membranes are moist.     Pharynx: Oropharynx is clear.  Eyes:     Extraocular Movements: Extraocular movements intact.     Conjunctiva/sclera: Conjunctivae normal.  Cardiovascular:     Rate and Rhythm: Normal rate and regular rhythm.     Heart sounds: Normal heart sounds.  Pulmonary:     Effort: Pulmonary effort is normal.     Breath sounds: Normal breath sounds.  Abdominal:     General: Bowel sounds are normal. There is no distension.     Palpations: Abdomen is soft.     Tenderness: There is no abdominal tenderness. There is no guarding.  Musculoskeletal:        General: Tenderness (mild ttp right lateral mid-low back) present. No swelling, deformity or signs of injury. Normal range of motion.     Cervical back: Normal range of motion and neck supple.  Skin:    General: Skin is warm and dry.     Findings: No erythema, lesion or rash.  Neurological:     Mental Status: She is alert and oriented to person, place, and time.     Sensory: No sensory deficit.     Motor: No weakness.  Psychiatric:        Mood and Affect: Mood normal.        Thought Content: Thought content normal.        Judgment: Judgment normal.     UC Treatments / Results  Labs (all labs ordered are listed, but only abnormal results are displayed) Labs Reviewed  POCT URINALYSIS DIPSTICK, ED / UC - Abnormal; Notable for the following components:      Result Value   Bilirubin Urine SMALL (*)    Leukocytes,Ua TRACE (*)    All other components within normal limits    EKG   Radiology No results found.  Procedures Procedures (including critical care  time)  Medications Ordered in UC Medications - No data to display  Initial Impression / Assessment and Plan / UC Course  I have reviewed the triage vital signs and the nursing notes.  Pertinent labs & imaging results that were available during my care of the patient were reviewed by me and considered in my medical decision making (see chart for details).     Urine not indicative of UTI, kidney stones today, exam overall benign, patient well appearing and in no acute distress, vital signs overall stable aside from mild tachycardia at 104 which she thinks to be from pain from walking back to be triaged. Will treat for musculoskeletal pain with very low dose flexeril, lidocaine patches and supportive home care but gave strict ED precautions for worsening or changing sxs. Patient is agreeable to plan today.   Final Clinical Impressions(s) / UC Diagnoses   Final diagnoses:  Acute right-sided low back pain without sciatica   Discharge Instructions   None    ED Prescriptions    Medication Sig Dispense Auth. Provider   cyclobenzaprine (FLEXERIL) 5 MG tablet Take 0.5-1 tablets (2.5-5 mg total) by mouth at bedtime as needed for muscle spasms. 10 tablet Volney American, PA-C   lidocaine (LIDODERM) 5 % Place 1 patch onto the skin daily. Remove & Discard patch within 12 hours or as directed by MD 45 patch Volney American, PA-C     PDMP not reviewed this encounter.   Magan, [place], [place] 10/04/20  1251  

## 2020-10-17 ENCOUNTER — Other Ambulatory Visit: Payer: Self-pay | Admitting: Adult Health

## 2020-10-17 DIAGNOSIS — I10 Essential (primary) hypertension: Secondary | ICD-10-CM

## 2020-10-19 ENCOUNTER — Ambulatory Visit
Admission: RE | Admit: 2020-10-19 | Discharge: 2020-10-19 | Disposition: A | Discharge status: Home / Self Care | Payer: Medicare Other | Source: Ambulatory Visit | Attending: Adult Health | Admitting: Adult Health

## 2020-10-19 ENCOUNTER — Other Ambulatory Visit: Payer: Self-pay

## 2020-10-19 DIAGNOSIS — Z1231 Encounter for screening mammogram for malignant neoplasm of breast: Secondary | ICD-10-CM

## 2020-10-19 DIAGNOSIS — Z780 Asymptomatic menopausal state: Secondary | ICD-10-CM | POA: Diagnosis not present

## 2020-10-19 DIAGNOSIS — E2839 Other primary ovarian failure: Secondary | ICD-10-CM

## 2020-10-20 ENCOUNTER — Other Ambulatory Visit: Payer: Self-pay

## 2020-10-20 ENCOUNTER — Other Ambulatory Visit (INDEPENDENT_AMBULATORY_CARE_PROVIDER_SITE_OTHER): Payer: Medicare Other

## 2020-10-20 DIAGNOSIS — E038 Other specified hypothyroidism: Secondary | ICD-10-CM

## 2020-10-21 LAB — T3, FREE: T3, Free: 3 pg/mL (ref 2.3–4.2)

## 2020-10-21 LAB — T4, FREE: Free T4: 1 ng/dL (ref 0.8–1.8)

## 2020-10-21 LAB — TSH: TSH: 4.77 mIU/L — ABNORMAL HIGH (ref 0.40–4.50)

## 2020-10-25 ENCOUNTER — Other Ambulatory Visit: Payer: Self-pay | Admitting: Adult Health

## 2020-10-25 DIAGNOSIS — R928 Other abnormal and inconclusive findings on diagnostic imaging of breast: Secondary | ICD-10-CM

## 2020-11-24 ENCOUNTER — Other Ambulatory Visit: Payer: Self-pay | Admitting: Endocrinology

## 2020-11-28 ENCOUNTER — Other Ambulatory Visit: Payer: Self-pay | Admitting: Endocrinology

## 2020-11-28 ENCOUNTER — Other Ambulatory Visit: Payer: Self-pay | Admitting: Adult Health

## 2020-11-28 NOTE — Telephone Encounter (Signed)
It is for iron deficiency anemia. Ok to send in for a year

## 2020-11-28 NOTE — Telephone Encounter (Signed)
Unsure what this is.  Please help.Marland KitchenMarland Kitchen

## 2020-11-29 NOTE — Telephone Encounter (Signed)
Sent to the pharmacy by e-scribe. 

## 2020-12-14 ENCOUNTER — Other Ambulatory Visit: Payer: Medicare Other

## 2020-12-21 ENCOUNTER — Other Ambulatory Visit: Payer: Self-pay

## 2020-12-21 ENCOUNTER — Ambulatory Visit: Payer: Medicare Other

## 2020-12-21 ENCOUNTER — Ambulatory Visit
Admission: RE | Admit: 2020-12-21 | Discharge: 2020-12-21 | Disposition: A | Discharge status: Home / Self Care | Payer: Medicare Other | Source: Ambulatory Visit | Attending: Adult Health | Admitting: Adult Health

## 2020-12-21 DIAGNOSIS — R922 Inconclusive mammogram: Secondary | ICD-10-CM | POA: Diagnosis not present

## 2020-12-21 DIAGNOSIS — R928 Other abnormal and inconclusive findings on diagnostic imaging of breast: Secondary | ICD-10-CM

## 2020-12-29 ENCOUNTER — Other Ambulatory Visit: Payer: Medicare Other

## 2021-01-01 ENCOUNTER — Other Ambulatory Visit (INDEPENDENT_AMBULATORY_CARE_PROVIDER_SITE_OTHER): Payer: Medicare Other

## 2021-01-01 ENCOUNTER — Other Ambulatory Visit: Payer: Self-pay

## 2021-01-01 DIAGNOSIS — E063 Autoimmune thyroiditis: Secondary | ICD-10-CM

## 2021-01-01 DIAGNOSIS — E1165 Type 2 diabetes mellitus with hyperglycemia: Secondary | ICD-10-CM

## 2021-01-01 DIAGNOSIS — E7800 Pure hypercholesterolemia, unspecified: Secondary | ICD-10-CM

## 2021-01-01 LAB — COMPREHENSIVE METABOLIC PANEL
ALT: 16 U/L (ref 0–35)
AST: 17 U/L (ref 0–37)
Albumin: 4.4 g/dL (ref 3.5–5.2)
Alkaline Phosphatase: 103 U/L (ref 39–117)
BUN: 22 mg/dL (ref 6–23)
CO2: 29 mEq/L (ref 19–32)
Calcium: 9.6 mg/dL (ref 8.4–10.5)
Chloride: 99 mEq/L (ref 96–112)
Creatinine, Ser: 1.15 mg/dL (ref 0.40–1.20)
GFR: 46.4 mL/min — ABNORMAL LOW (ref >60.00)
Glucose, Bld: 100 mg/dL — ABNORMAL HIGH (ref 70–99)
Potassium: 4.7 mEq/L (ref 3.5–5.1)
Sodium: 138 mEq/L (ref 135–145)
Total Bilirubin: 0.3 mg/dL (ref 0.2–1.2)
Total Protein: 8.2 g/dL (ref 6.0–8.3)

## 2021-01-01 LAB — LIPID PANEL
Cholesterol: 170 mg/dL (ref 0–200)
HDL: 47.7 mg/dL (ref >39.00)
LDL Cholesterol: 100 mg/dL — ABNORMAL HIGH (ref 0–99)
NonHDL: 122.62
Total CHOL/HDL Ratio: 4
Triglycerides: 113 mg/dL (ref 0.0–149.0)
VLDL: 22.6 mg/dL (ref 0.0–40.0)

## 2021-01-01 LAB — HEMOGLOBIN A1C: Hgb A1c MFr Bld: 6.8 % — ABNORMAL HIGH (ref 4.6–6.5)

## 2021-01-01 LAB — T4, FREE: Free T4: 0.78 ng/dL (ref 0.60–1.60)

## 2021-01-01 LAB — TSH: TSH: 1.98 u[IU]/mL (ref 0.35–4.50)

## 2021-01-03 ENCOUNTER — Ambulatory Visit (INDEPENDENT_AMBULATORY_CARE_PROVIDER_SITE_OTHER): Payer: Medicare Other | Admitting: Endocrinology

## 2021-01-03 ENCOUNTER — Other Ambulatory Visit: Payer: Self-pay

## 2021-01-03 ENCOUNTER — Encounter: Payer: Self-pay | Admitting: Endocrinology

## 2021-01-03 VITALS — BP 140/90 | HR 102 | Ht 62.0 in | Wt 149.4 lb

## 2021-01-03 DIAGNOSIS — E063 Autoimmune thyroiditis: Secondary | ICD-10-CM

## 2021-01-03 DIAGNOSIS — E1165 Type 2 diabetes mellitus with hyperglycemia: Secondary | ICD-10-CM | POA: Diagnosis not present

## 2021-01-03 DIAGNOSIS — I10 Essential (primary) hypertension: Secondary | ICD-10-CM

## 2021-01-03 NOTE — Progress Notes (Signed)
Patient ID: Pam Obrien, female   DOB: 1946-04-30, 75 y.o.   MRN: [id number]     Reason for Appointment: Followup for Type 2 Diabetes   History of Present Illness:          Diagnosis: Type 2 diabetes mellitus, date of diagnosis:  4/13      Past history:  She was initially started on metformin but he thinks that this made her sleepy and had some abdominal discomfort   She also has been tried on other oral hypoglycemic regimens including Jentadueto  since 2014  But she had similar side effects with this  And not clear how regularly she had been taking this until it was discontinued in 5/15  A1c appears to be persistently over 8% Since 2014  She was tried on Invokamet by her PCP but because of excessive urination with this and also some nausea and weakness it was stopped With glyburide alone her blood sugars are poorly controlled and she was referred here for further management She was given Januvia but she did not take this because of expense of the medication   She was referred to the nurse educator and started on basal insulin to improve her control on 06/07/14 This was changed to premixed insulin in 9/15 because of continued poor control and her not being able to afford NovoLog mix insulin or Invokana  She stopped taking insulin in 1/16 because of the cost and was taking only Amaryl.  Recent history:   Hypoglycemic drugs: Glimepiride 4 mg at supper, metformin ER 500 mg in a.m. and pm    Her A1c is again 6.8, has been as low as 6.4  Current management, blood sugar patterns and problems identified:  She did have an episode of low blood sugar of 52 and also a reading of 66 about a week ago but she did not report this  Except for 1 reading of 170 blood sugars are fairly close to normal and averaging only 97  She appears to have lost weight  She said that sometimes when she has allergies she may not feel as good, appetite and meal planning is variable  Sometimes in  the morning she will just have fruit and cookies for breakfast  No side effects from Metformin  She was not able to do any exercise and she is doing a little chair exercises with weights at home only  Lab glucose was 100  She usually refuses to consider brand-name medications  Glucose monitoring with Accu-Chek meter   AVERAGE blood sugar recently 97 with median 189 and range 52-170 with some readings at various times but mostly in the mornings and before lunch  Previous data:   PRE-MEAL Fasting  midday Dinner Bedtime Overall  Glucose range: 136  124-156  69-124    Mean/median:      123     Side effects from medications have been: Metformin higher doses will cause abdominal discomfort  Self-care: The diet that the patient has been following is: tries to limit  portions      Meals:  usually 2-3 meals per day. Usually has variable intake in the morning, sometimes toast/fruit, usually has carbohydrates at dinner time, will have fruit for snacks        Dietician visit: Most recent: 6/17 .              CDE visit last in 7/15  Wt Readings from Last 3 Encounters:  01/03/21 149 lb 6.4 oz (67.8  kg)  09/13/20 153 lb 9.6 oz (69.7 kg)  08/17/20 156 lb (70.8 kg)       Glycemic control:   Lab Results  Component Value Date   HGBA1C 6.8 (H) 01/01/2021   HGBA1C 6.8 (H) 09/06/2020   HGBA1C 6.8 (A) 05/10/2020   Lab Results  Component Value Date   MICROALBUR 1.4 09/06/2020   LDLCALC 100 (H) 01/01/2021   CREATININE 1.15 01/01/2021    Lab on 01/01/2021  Component Date Value Ref Range Status  . TSH 01/01/2021 1.98  0.35 - 4.50 uIU/mL Final  . Cholesterol 01/01/2021 170  0 - 200 mg/dL Final   ATP III Classification       Desirable:  < 200 mg/dL               Borderline High:  200 - 239 mg/dL          High:  > = 240 mg/dL  . Triglycerides 01/01/2021 113.0  0.0 - 149.0 mg/dL Final   Normal:  <150 mg/dLBorderline High:  150 - 199 mg/dL  . HDL 01/01/2021 47.70  >39.00 mg/dL Final  .  VLDL 01/01/2021 22.6  0.0 - 40.0 mg/dL Final  . LDL Cholesterol 01/01/2021 100* 0 - 99 mg/dL Final  . Total CHOL/HDL Ratio 01/01/2021 4   Final                  Men          Women1/2 Average Risk     3.4          3.3Average Risk          5.0          4.42X Average Risk          9.6          7.13X Average Risk          15.0          11.0                      . NonHDL 01/01/2021 122.62   Final   NOTE:  Non-HDL goal should be 30 mg/dL higher than patient's LDL goal (i.e. LDL goal of < 70 mg/dL, would have non-HDL goal of < 100 mg/dL)  . Sodium 01/01/2021 138  135 - 145 mEq/L Final  . Potassium 01/01/2021 4.7  3.5 - 5.1 mEq/L Final  . Chloride 01/01/2021 99  96 - 112 mEq/L Final  . CO2 01/01/2021 29  19 - 32 mEq/L Final  . Glucose, Bld 01/01/2021 100* 70 - 99 mg/dL Final  . BUN 01/01/2021 22  6 - 23 mg/dL Final  . Creatinine, Ser 01/01/2021 1.15  0.40 - 1.20 mg/dL Final  . Total Bilirubin 01/01/2021 0.3  0.2 - 1.2 mg/dL Final  . Alkaline Phosphatase 01/01/2021 103  39 - 117 U/L Final  . AST 01/01/2021 17  0 - 37 U/L Final  . ALT 01/01/2021 16  0 - 35 U/L Final  . Total Protein 01/01/2021 8.2  6.0 - 8.3 g/dL Final  . Albumin 01/01/2021 4.4  3.5 - 5.2 g/dL Final  . GFR 01/01/2021 46.40* >60.00 mL/min Final   Calculated using the CKD-EPI Creatinine Equation (2021)  . Calcium 01/01/2021 9.6  8.4 - 10.5 mg/dL Final  . Hgb A1c MFr Bld 01/01/2021 6.8* 4.6 - 6.5 % Final   Glycemic Control Guidelines for People with Diabetes:Non Diabetic:  <6%Goal of Therapy: <7%Additional Action Suggested:  >8%   .  Free T4 01/01/2021 0.78  0.60 - 1.60 ng/dL Final   Comment: Specimens from patients who are undergoing biotin therapy and /or ingesting biotin supplements may contain high levels of biotin.  The higher biotin concentration in these specimens interferes with this Free T4 assay.  Specimens that contain high levels  of biotin may cause false high results for this Free T4 assay.  Please interpret results in  light of the total clinical presentation of the patient.       Weight history:  Previous range upto 172   Wt Readings from Last 3 Encounters:  01/03/21 149 lb 6.4 oz (67.8 kg)  09/13/20 153 lb 9.6 oz (69.7 kg)  08/17/20 156 lb (70.8 kg)    Allergies as of 01/03/2021      Reactions   Aspirin    REACTION: nervous   Beef-derived Products    Sneezing, nasal drainage, throat scratchy   Celecoxib    REACTION: joints swell   Milk-related Compounds    itching   Mold Extract [trichophyton]    Pt does not know the reaction.  Positive allergy test per pt.   Nsaids    REACTION: sweling   Peanuts [peanut Oil]    Throat feels scratchy   Shellfish Allergy    Nasal congestion, throat scratchy, eyes watery   Sulfamethoxazole    REACTION: urticaria (hives)   Vioxx [rofecoxib] Swelling      Medication List       Accurate as of January 03, 2021 11:59 PM. If you have any questions, ask your nurse or doctor.        STOP taking these medications   rosuvastatin 40 MG tablet Commonly known as: Crestor Stopped by: Elayne Snare, MD     TAKE these medications   Accu-Chek Aviva Plus w/Device Kit Use Accu Chek Aviva Plus to check blood sugar once daily.   Accu-Chek [employer] Kit by Does not apply route. USE LANCET TO CHECK BLOOD SUGAR ONCE DAILY.   Accu-Chek Softclix Lancets lancets USE AS DIRECTED EVERY DAY   acetaminophen 500 MG tablet Commonly known as: TYLENOL Take 1 tablet (500 mg total) by mouth every 6 (six) hours as needed.   atenolol-chlorthalidone 50-25 MG tablet Commonly known as: TENORETIC TAKE 1/2 TABLET BY MOUTH EVERY DAY   calcium citrate-vitamin D 315-200 MG-UNIT tablet Commonly known as: CITRACAL+D Take 1 tablet by mouth daily.   cholecalciferol 1000 units tablet Commonly known as: VITAMIN D Take 2,000 Units by mouth daily.   clotrimazole-betamethasone cream Commonly known as: LOTRISONE Apply to affected area 2 times daily for 2 weeks.    cyclobenzaprine 5 MG tablet Commonly known as: FLEXERIL Take 0.5-1 tablets (2.5-5 mg total) by mouth at bedtime as needed for muscle spasms.   glimepiride 4 MG tablet Commonly known as: AMARYL TAKE 1 TABLET BY MOUTH DAILY AT SUPPER.   glucose blood test strip Use Accu Chek Aviva test strips as instructed to check blood sugar once daily.   hydrOXYzine 25 MG tablet Commonly known as: ATARAX/VISTARIL Take 0.5 tablets (12.5 mg total) by mouth 2 (two) times daily as needed for itching.   levothyroxine 25 MCG tablet Commonly known as: Synthroid Take 1 tablet (25 mcg total) by mouth daily before breakfast.   lidocaine 5 % Commonly known as: Lidoderm Place 1 patch onto the skin daily. Remove & Discard patch within 12 hours or as directed by MD   loratadine 10 MG tablet Commonly known as: CLARITIN Take 1 tablet (10 mg total)  by mouth 2 (two) times daily as needed for allergies.   metFORMIN 500 MG 24 hr tablet Commonly known as: GLUCOPHAGE-XR TAKE 2 TABLETS BY MOUTH EVERY DAY WITH SUPPER   omeprazole 20 MG capsule Commonly known as: PRILOSEC Take 20 mg by mouth daily.   Se-Tan PLUS 162-115.2-1 MG Caps TAKE 1 CAPSULE BY MOUTH EVERY DAY   simvastatin 20 MG tablet Commonly known as: ZOCOR TAKE 1 TABLET BY MOUTH EVERY DAY What changed:   when to take this  additional instructions   spironolactone 50 MG tablet Commonly known as: ALDACTONE TAKE 1 TABLET BY MOUTH EVERY DAY       Allergies:  Allergies  Allergen Reactions  . Aspirin     REACTION: nervous  . Beef-Derived Products     Sneezing, nasal drainage, throat scratchy  . Celecoxib     REACTION: joints swell  . Milk-Related Compounds     itching  . Mold Extract [Trichophyton]     Pt does not know the reaction.  Positive allergy test per pt.  . Nsaids     REACTION: sweling  . Peanuts [Peanut Oil]     Throat feels scratchy  . Shellfish Allergy     Nasal congestion, throat scratchy, eyes watery  .  Sulfamethoxazole     REACTION: urticaria (hives)  . Vioxx [Rofecoxib] Swelling    Past Medical History:  Diagnosis Date  . Allergy   . Anemia    as a young adult  . Arthritis   . Diabetes mellitus without complication ([place])   . Hyperlipidemia    was normal range 05/2017 with PCP  . Hypertension     Past Surgical History:  Procedure Laterality Date  . ABDOMINAL HYSTERECTOMY  1994  . COLONOSCOPY  2008  . FLEXIBLE SIGMOIDOSCOPY  2003  . POLYPECTOMY      Family History  Problem Relation Age of Onset  . Hypertension Sister   . Stroke Other   . Diabetes Father   . Hypertension Father   . Stroke Father   . Diabetes Sister   . Diabetes Brother   . Hypertension Brother   . Stroke Brother   . Cancer Mother   . Esophageal cancer Neg Hx   . Rectal cancer Neg Hx   . Stomach cancer Neg Hx   . Colon cancer Neg Hx     Social History:  reports that she has never smoked. She has never used smokeless tobacco. She reports that she does not drink alcohol and does not use drugs.    Review of Systems    HYPERTENSION: She takes a half a tablet of atenolol/chlorthalidone along with half tablet of spironolactone 50 mg Does not check blood pressure at home This is followed by her PCP  BP Readings from Last 3 Encounters:  01/03/21 140/90  10/04/20 137/64  10/01/20 (!) 137/92    HYPOKALEMIA: This is secondary to HCTZ controlled with Aldactone Taking a half of a 50 mg tablet daily  Lab Results  Component Value Date   K 4.7 01/01/2021     Lab Results  Component Value Date   CREATININE 1.15 01/01/2021   CREATININE 1.21 (H) 09/06/2020   CREATININE 1.10 (H) 08/17/2020      Lipids: She did not take pravastatin previously prescribed because of cost Started simvastatin in 7/19   Her PCP also tried her on Crestor but she went back on simvastatin She is still thinks that she will have pain in her arm at times either  on the right or the left but this is tolerable  LDL has  improved  She does have family history of CVA but no known history of CAD    Lab Results  Component Value Date   CHOL 170 01/01/2021   CHOL 212 (H) 08/17/2020   CHOL 199 05/10/2020   Lab Results  Component Value Date   HDL 47.70 01/01/2021   HDL 54 08/17/2020   HDL 48.80 05/10/2020   Lab Results  Component Value Date   LDLCALC 100 (H) 01/01/2021   LDLCALC 138 (H) 08/17/2020   LDLCALC 131 (H) 05/10/2020   Lab Results  Component Value Date   TRIG 113.0 01/01/2021   TRIG 101 08/17/2020   TRIG 99.0 05/10/2020   Lab Results  Component Value Date   CHOLHDL 4 01/01/2021   CHOLHDL 3.9 08/17/2020   CHOLHDL 4 05/10/2020   Lab Results  Component Value Date   LDLDIRECT 98.0 12/02/2019   LDLDIRECT 130.0 05/17/2016   LDLDIRECT 121.7 03/04/2012     Last foot exam was in 12/2018, she does not complain of any  symptoms of neuropathy  She has had routine thyroid tests and TSH has been progressively higher She has had some fatigue Although her TSH is normal now she is not think she is taking her levothyroxine 25 mcg as prescribed  Lab Results  Component Value Date   TSH 1.98 01/01/2021   TSH 4.77 (H) 10/20/2020   TSH 5.54 (H) 08/17/2020   FREET4 0.78 01/01/2021   FREET4 1.0 10/20/2020   FREET4 0.9 06/02/2009      Physical Examination:  BP 140/90   Pulse (!) 102   Ht $R'5\' 2"'WA$  (1.575 m)   Wt 149 lb 6.4 oz (67.8 kg)   SpO2 96%   BMI 27.33 kg/m       ASSESSMENT:  Diabetes type 2 nonobeses  See history of present illness for detailed discussion of  current management, blood sugar patterns and problems identified  A1c is 6.8 consistently  She is on Amaryl and Metformin Weight is coming down She is having occasional low blood sugars as low as 52 Likely is not eating balanced meals running low sugars however Her blood sugars in the mornings have been as low as 72   LIPIDS: She has taken simvastatin more regularly and is tolerating it LDL is 100  History of  hypokalemia: Controlled with Aldactone and will continue the 25 mg dose  She will follow up with her PCP regarding blood pressure and adjustment of medications if needed, likely needs ACE inhibitor  Hypothyroidism: She is not complaining as much of fatigue but she is not sure if she is taking her levothyroxine TSH is however back to normal  PLAN:    Reduce Amaryl to half tablet  She will try to check blood sugars regularly by rotation after meals also  She will let us know if blood sugars are not consistently controlled  Make sure she is eating balanced meals with protein especially at breakfast  Continue SIMVASTATIN regularly  She will call back to let us know if she is still taking levothyroxine and if she is she will continue    Patient Instructions  TAKE ONLY 1/2 GLIMEPERIDE DAILY  Check to see if taking Levethyroxine 25ug  Eat balanced meals with some protein each time       Elayne Snare 01/04/2021, 9:06 AM   Note: This office note was prepared with Dragon voice recognition system technology. Any transcriptional errors that  result from this process are unintentional.

## 2021-01-03 NOTE — Patient Instructions (Addendum)
TAKE ONLY 1/2 GLIMEPERIDE DAILY  Check to see if taking Levethyroxine 25ug  Eat balanced meals with some protein each time

## 2021-01-04 ENCOUNTER — Encounter: Payer: Self-pay | Admitting: Endocrinology

## 2021-01-08 ENCOUNTER — Other Ambulatory Visit: Payer: Self-pay | Admitting: Endocrinology

## 2021-01-10 ENCOUNTER — Ambulatory Visit: Payer: Medicare Other | Admitting: Adult Health

## 2021-01-11 ENCOUNTER — Other Ambulatory Visit: Payer: Self-pay

## 2021-01-11 ENCOUNTER — Encounter: Payer: Self-pay | Admitting: Adult Health

## 2021-01-11 ENCOUNTER — Ambulatory Visit (INDEPENDENT_AMBULATORY_CARE_PROVIDER_SITE_OTHER): Payer: Medicare Other | Admitting: Adult Health

## 2021-01-11 VITALS — BP 130/82 | HR 94 | Temp 98.5°F | Ht 62.0 in | Wt 150.6 lb

## 2021-01-11 DIAGNOSIS — I10 Essential (primary) hypertension: Secondary | ICD-10-CM | POA: Diagnosis not present

## 2021-01-11 NOTE — Progress Notes (Signed)
Subjective:    Patient ID: Pam Obrien, female    DOB: 31-Dec-1945, 75 y.o.   MRN: [id number]  HPI 75 year old female who  has a past medical history of Allergy, Anemia, Arthritis, Diabetes mellitus without complication ([place]), Hyperlipidemia, and Hypertension.  She presents to the office today for follow up regarding elevated blood pressure reading. She reports that she was at Endocrinology on 01/01/2021 and her blood pressure was elevated at 140/90.  She was advised to follow-up with her PCP about this.  She was taking her 1/2 tab of Atenolol had been out of spironolactone.  She reports that she is going to pick up this medication today as it was called in 2 days ago.  Blood Pressure in the office is stable at 130/82  Review of Systems See HPI   Past Medical History:  Diagnosis Date  . Allergy   . Anemia    as a young adult  . Arthritis   . Diabetes mellitus without complication ([place])   . Hyperlipidemia    was normal range 05/2017 with PCP  . Hypertension     Social History   Socioeconomic History  . Marital status: Married    Spouse name: Not on file  . Number of children: Not on file  . Years of education: Not on file  . Highest education level: Not on file  Occupational History  . Not on file  Tobacco Use  . Smoking status: Never Smoker  . Smokeless tobacco: Never Used  Vaping Use  . Vaping Use: Never used  Substance and Sexual Activity  . Alcohol use: No  . Drug use: No  . Sexual activity: Not Currently    Birth control/protection: Abstinence, None  Other Topics Concern  . Not on file  Social History Narrative   Retired from [employer] work    Separated for 9 years.    Five children, two live locally, three are in [place]   One of her daughters live with her   Has a dog.    She likes to dance and walk   Social Determinants of Health   Financial Resource Strain: Low Risk   . Difficulty of Paying Living Expenses: Not hard at all  Food Insecurity: No  Food Insecurity  . Worried About Charity fundraiser in the Last Year: Never true  . Ran Out of Food in the Last Year: Never true  Transportation Needs: No Transportation Needs  . Lack of Transportation (Medical): No  . Lack of Transportation (Non-Medical): No  Physical Activity: Insufficiently Active  . Days of Exercise per Week: 3 days  . Minutes of Exercise per Session: 30 min  Stress: No Stress Concern Present  . Feeling of Stress : Not at all  Social Connections: Moderately Isolated  . Frequency of Communication with Friends and Family: More than three times a week  . Frequency of Social Gatherings with Friends and Family: More than three times a week  . Attends Religious Services: 1 to 4 times per year  . Active Member of Clubs or Organizations: No  . Attends Archivist Meetings: Never  . Marital Status: Divorced  Human resources officer Violence: Not At Risk  . Fear of Current or Ex-Partner: No  . Emotionally Abused: No  . Physically Abused: No  . Sexually Abused: No    Past Surgical History:  Procedure Laterality Date  . ABDOMINAL HYSTERECTOMY  1994  . COLONOSCOPY  2008  . FLEXIBLE SIGMOIDOSCOPY  2003  .  POLYPECTOMY      Family History  Problem Relation Age of Onset  . Hypertension Sister   . Stroke Other   . Diabetes Father   . Hypertension Father   . Stroke Father   . Diabetes Sister   . Diabetes Brother   . Hypertension Brother   . Stroke Brother   . Cancer Mother   . Esophageal cancer Neg Hx   . Rectal cancer Neg Hx   . Stomach cancer Neg Hx   . Colon cancer Neg Hx     Allergies  Allergen Reactions  . Aspirin     REACTION: nervous  . Beef-Derived Products     Sneezing, nasal drainage, throat scratchy  . Celecoxib     REACTION: joints swell  . Milk-Related Compounds     itching  . Mold Extract [Trichophyton]     Pt does not know the reaction.  Positive allergy test per pt.  . Nsaids     REACTION: sweling  . Peanuts [Peanut Oil]      Throat feels scratchy  . Shellfish Allergy     Nasal congestion, throat scratchy, eyes watery  . Sulfamethoxazole     REACTION: urticaria (hives)  . Vioxx [Rofecoxib] Swelling    Current Outpatient Medications on File Prior to Visit  Medication Sig Dispense Refill  . Accu-Chek Softclix Lancets lancets USE AS DIRECTED EVERY DAY 100 each 1  . acetaminophen (TYLENOL) 500 MG tablet Take 1 tablet (500 mg total) by mouth every 6 (six) hours as needed. 30 tablet 0  . atenolol-chlorthalidone (TENORETIC) 50-25 MG tablet TAKE 1/2 TABLET BY MOUTH EVERY DAY 45 tablet 1  . Blood Glucose Monitoring Suppl (ACCU-CHEK AVIVA PLUS) w/Device KIT Use Accu Chek Aviva Plus to check blood sugar once daily. 1 kit 0  . calcium citrate-vitamin D (CITRACAL+D) 315-200 MG-UNIT per tablet Take 1 tablet by mouth daily.    . cholecalciferol (VITAMIN D) 1000 UNITS tablet Take 2,000 Units by mouth daily.    Marland Kitchen FeFum-FePo-FA-B Cmp-C-Zn-Mn-Cu (SE-TAN PLUS) 162-115.2-1 MG CAPS TAKE 1 CAPSULE BY MOUTH EVERY DAY 90 capsule 3  . glimepiride (AMARYL) 4 MG tablet TAKE 1 TABLET BY MOUTH DAILY AT SUPPER. 90 tablet 1  . glucose blood test strip Use Accu Chek Aviva test strips as instructed to check blood sugar once daily. 100 each 2  . Lancets Misc. (ACCU-CHEK FASTCLIX LANCET) KIT by Does not apply route. USE LANCET TO CHECK BLOOD SUGAR ONCE DAILY.    Marland Kitchen loratadine (CLARITIN) 10 MG tablet Take 1 tablet (10 mg total) by mouth 2 (two) times daily as needed for allergies. 180 tablet 3  . metFORMIN (GLUCOPHAGE-XR) 500 MG 24 hr tablet TAKE 2 TABLETS BY MOUTH EVERY DAY WITH SUPPER 180 tablet 1  . omeprazole (PRILOSEC) 20 MG capsule Take 20 mg by mouth daily.    . simvastatin (ZOCOR) 20 MG tablet TAKE 1 TABLET BY MOUTH EVERY DAY (Patient taking differently: every other day.) 90 tablet 1  . spironolactone (ALDACTONE) 50 MG tablet TAKE 1 TABLET BY MOUTH EVERY DAY 90 tablet 0   No current facility-administered medications on file prior to visit.     BP 130/82 (BP Location: Left Arm, Patient Position: Sitting, Cuff Size: Normal)   Pulse 94   Temp 98.5 F (36.9 C) (Oral)   Ht 5' 2" (1.575 m)   Wt 150 lb 9.6 oz (68.3 kg)   SpO2 98%   BMI 27.55 kg/m       Objective:  Physical Exam Vitals and nursing note reviewed.  Constitutional:      Appearance: Normal appearance.  Cardiovascular:     Rate and Rhythm: Normal rate and regular rhythm.  Pulmonary:     Effort: Pulmonary effort is normal.     Breath sounds: Normal breath sounds.  Skin:    General: Skin is warm and dry.     Capillary Refill: Capillary refill takes less than 2 seconds.  Neurological:     General: No focal deficit present.     Mental Status: She is alert and oriented to person, place, and time.  Psychiatric:        Mood and Affect: Mood normal.        Behavior: Behavior normal.        Thought Content: Thought content normal.        Judgment: Judgment normal.       Assessment & Plan:  1. Essential hypertension -Blood pressure better today.  Likely come down more with the spironolactone.  Follow-up as needed  [name], NP

## 2021-01-27 ENCOUNTER — Encounter: Payer: Self-pay | Admitting: *Deleted

## 2021-01-27 ENCOUNTER — Other Ambulatory Visit: Payer: Self-pay

## 2021-01-27 ENCOUNTER — Ambulatory Visit
Admission: EM | Admit: 2021-01-27 | Discharge: 2021-01-27 | Disposition: A | Discharge status: Home / Self Care | Payer: Medicare Other

## 2021-01-27 DIAGNOSIS — L299 Pruritus, unspecified: Secondary | ICD-10-CM

## 2021-01-27 DIAGNOSIS — M79672 Pain in left foot: Secondary | ICD-10-CM

## 2021-01-27 MED ORDER — TRIAMCINOLONE ACETONIDE 0.1 % EX CREA: 1.0000 "application " | TOPICAL_CREAM | Freq: Two times a day (BID) | CUTANEOUS | 0 refills | Status: DC

## 2021-01-27 MED ORDER — TRIAMCINOLONE 0.1 % CREAM:EUCERIN CREAM 1:1: 1.0000 "application " | TOPICAL_CREAM | Freq: Two times a day (BID) | CUTANEOUS | 0 refills | Status: DC

## 2021-01-27 NOTE — ED Triage Notes (Addendum)
Denies injury.  C/O pain and pruritis to bottom and lateral aspect of left foot since yesterday.  All toes warm with prompt cap refill. States started taking hydroxyzine and clotrimazole/betamethasone cream as was prescribed last yr, but no reports no relief.

## 2021-01-27 NOTE — Discharge Instructions (Signed)
May continue hydroxyzine as needed for itching Triamcinolone cream twice daily as needed for itching Eucerin or CeraVe a cream to foot twice daily, before bedtime and apply sock If you are able to find a compounding pharmacy may take printed prescription which is a mixture of the triamcinolone cream and Eucerin

## 2021-01-27 NOTE — ED Provider Notes (Signed)
[name]    CSN: [id number] Arrival date & time: 01/27/21  1424      History   Chief Complaint Chief Complaint  Patient presents with  . Foot Pain    HPI Pam Obrien is a 75 y.o. female history of hypertension, hyperlipidemia, DM type II presenting today for evaluation of left foot itching.  She reports over the past few days she has developed intermittent episodes of foot itching to her left foot.  Mainly noted on lateral aspect and sole of foot.  She reports history of similar few months ago and was given hydroxyzine and Lotrisone.  Tried using this today without significant improvement.  She denies any injury or trauma, denies significant pain.  Denies any rash.  Does have history of diabetes, but denies any burning numbness tingling sensations.  HPI  Past Medical History:  Diagnosis Date  . Allergy   . Anemia    as a young adult  . Arthritis   . Diabetes mellitus without complication ([place])   . Hyperlipidemia    was normal range 05/2017 with PCP  . Hypertension     Patient Active Problem List   Diagnosis Date Noted  . Hyperlipidemia 07/21/2014  . Type II diabetes mellitus, uncontrolled ([place]) 01/04/2013  . ESOPHAGEAL REFLUX 06/18/2010  . HYPOKALEMIA, MILD 06/02/2009  . Disorder of bone and cartilage 12/11/2007  . COLONIC POLYPS, HX OF 12/11/2007  . Essential hypertension 06/23/2007  . ALLERGIC RHINITIS 06/23/2007  . OSTEOARTHRITIS 06/23/2007    Past Surgical History:  Procedure Laterality Date  . ABDOMINAL HYSTERECTOMY  1994  . COLONOSCOPY  2008  . FLEXIBLE SIGMOIDOSCOPY  2003  . POLYPECTOMY      OB History   No obstetric history on file.      Home Medications    Prior to Admission medications   Medication Sig Start Date End Date Taking? Authorizing Provider  acetaminophen (TYLENOL) 500 MG tablet Take 1 tablet (500 mg total) by mouth every 6 (six) hours as needed. 07/30/18  Yes Zigmund Gottron, NP  atenolol-chlorthalidone  (TENORETIC) 50-25 MG tablet TAKE 1/2 TABLET BY MOUTH EVERY DAY 10/17/20  Yes Nafziger, [name], NP  calcium citrate-vitamin D (CITRACAL+D) 315-200 MG-UNIT per tablet Take 1 tablet by mouth daily.   Yes [provider]  cholecalciferol (VITAMIN D) 1000 UNITS tablet Take 2,000 Units by mouth daily.   Yes [provider]  clotrimazole-betamethasone (LOTRISONE) cream Apply 1 application topically 2 (two) times daily.   Yes [provider]  FeFum-FePo-FA-B Cmp-C-Zn-Mn-Cu (SE-TAN PLUS) 162-115.2-1 MG CAPS TAKE 1 CAPSULE BY MOUTH EVERY DAY 11/29/20  Yes Nafziger, [name], NP  glimepiride (AMARYL) 4 MG tablet TAKE 1 TABLET BY MOUTH DAILY AT SUPPER. 11/24/20  Yes Elayne Snare, MD  hydrOXYzine (ATARAX/VISTARIL) 25 MG tablet Take 25 mg by mouth 3 (three) times daily as needed. Take 1/2 tablet bid for itching   Yes [provider]  loratadine (CLARITIN) 10 MG tablet Take 1 tablet (10 mg total) by mouth 2 (two) times daily as needed for allergies. 03/14/14  Yes Ricard Dillon, MD  metFORMIN (GLUCOPHAGE-XR) 500 MG 24 hr tablet TAKE 2 TABLETS BY MOUTH EVERY DAY WITH SUPPER 11/28/20  Yes Elayne Snare, MD  omeprazole (PRILOSEC) 20 MG capsule Take 20 mg by mouth daily.   Yes [provider]  simvastatin (ZOCOR) 20 MG tablet TAKE 1 TABLET BY MOUTH EVERY DAY Patient taking differently: every other day. 11/01/19  Yes Elayne Snare, MD  spironolactone (ALDACTONE) 50  MG tablet TAKE 1 TABLET BY MOUTH EVERY DAY 01/09/21  Yes Elayne Snare, MD  triamcinolone (KENALOG) 0.1 % Apply 1 application topically 2 (two) times daily. 01/27/21  Yes Jabree Pernice C, PA-C  Triamcinolone Acetonide (TRIAMCINOLONE 0.1 % CREAM : EUCERIN) CREA Apply 1 application topically 2 (two) times daily. 01/27/21  Yes Jajuan Skoog, Mercer C, PA-C  Accu-Chek Softclix Lancets lancets USE AS DIRECTED EVERY DAY 07/31/20   Elayne Snare, MD  Blood Glucose Monitoring Suppl (ACCU-CHEK AVIVA PLUS) w/Device KIT Use Accu Chek Aviva Plus to check  blood sugar once daily. 12/07/19   Elayne Snare, MD  glucose blood test strip Use Accu Chek Aviva test strips as instructed to check blood sugar once daily. 12/07/19   Elayne Snare, MD  Lancets Misc. (ACCU-CHEK FASTCLIX LANCET) KIT by Does not apply route. USE LANCET TO CHECK BLOOD SUGAR ONCE DAILY.    [provider]    Family History Family History  Problem Relation Age of Onset  . Hypertension Sister   . Stroke Other   . Diabetes Father   . Hypertension Father   . Stroke Father   . Diabetes Sister   . Diabetes Brother   . Hypertension Brother   . Stroke Brother   . Cancer Mother   . Esophageal cancer Neg Hx   . Rectal cancer Neg Hx   . Stomach cancer Neg Hx   . Colon cancer Neg Hx     Social History Social History   Tobacco Use  . Smoking status: Never Smoker  . Smokeless tobacco: Never Used  Vaping Use  . Vaping Use: Never used  Substance Use Topics  . Alcohol use: No  . Drug use: No     Allergies   Aspirin, Beef-derived products, Celecoxib, Milk-related compounds, Mold extract [trichophyton], Nsaids, Peanuts [peanut oil], Shellfish allergy, Sulfamethoxazole, and Vioxx [rofecoxib]   Review of Systems Review of Systems  Constitutional: Negative for fatigue and fever.  Eyes: Negative for visual disturbance.  Respiratory: Negative for shortness of breath.   Cardiovascular: Negative for chest pain.  Gastrointestinal: Negative for abdominal pain, nausea and vomiting.  Musculoskeletal: Negative for arthralgias and joint swelling.  Skin: Negative for color change, rash and wound.  Neurological: Negative for dizziness, weakness, light-headedness and headaches.     Physical Exam Triage Vital Signs ED Triage Vitals  Enc Vitals Group     BP      Pulse      Resp      Temp      Temp src      SpO2      Weight      Height      Head Circumference      Peak Flow      Pain Score      Pain Loc      Pain Edu?      Excl. in [place]?    No data  found.  Updated Vital Signs BP (!) 154/63 (BP Location: Left Arm)   Pulse 88   Temp 98.2 F (36.8 C) (Oral)   Resp 18   SpO2 96%   Visual Acuity Right Eye Distance:   Left Eye Distance:   Bilateral Distance:    Right Eye Near:   Left Eye Near:    Bilateral Near:     Physical Exam Vitals and nursing note reviewed.  Constitutional:      Appearance: She is well-developed.     Comments: No acute distress  HENT:  Head: Normocephalic and atraumatic.     Nose: Nose normal.  Eyes:     Conjunctiva/sclera: Conjunctivae normal.  Cardiovascular:     Rate and Rhythm: Normal rate.  Pulmonary:     Effort: Pulmonary effort is normal. No respiratory distress.  Abdominal:     General: There is no distension.  Musculoskeletal:        General: Normal range of motion.     Cervical back: Neck supple.     Comments: Left foot: No obvious swelling deformity or discoloration, dorsalis pedis 2+, nontender to palpation throughout entire dorsum of foot and plantar surface, full active range of motion  Skin:    General: Skin is warm and dry.  Neurological:     Mental Status: She is alert and oriented to person, place, and time.      UC Treatments / Results  Labs (all labs ordered are listed, but only abnormal results are displayed) Labs Reviewed - No data to display  EKG   Radiology No results found.  Procedures Procedures (including critical care time)  Medications Ordered in UC Medications - No data to display  Initial Impression / Assessment and Plan / UC Course  I have reviewed the triage vital signs and the nursing notes.  Pertinent labs & imaging results that were available during my care of the patient were reviewed by me and considered in my medical decision making (see chart for details).     Unclear cause of left-sided itching, encouraged to continue use hydroxyzine, but will trial triamcinolone along with moisturizing measures with Eucerin as alternative to help  with any underlying dryness contributing to itching.  No obvious rash, infection, no significant pain concerning for MSK injury.  Continue to monitor, Discussed strict return precautions. Patient verbalized understanding and is agreeable with plan.  Final Clinical Impressions(s) / UC Diagnoses   Final diagnoses:  Pruritus  Left foot pain     Discharge Instructions     May continue hydroxyzine as needed for itching Triamcinolone cream twice daily as needed for itching Eucerin or CeraVe a cream to foot twice daily, before bedtime and apply sock If you are able to find a compounding pharmacy may take printed prescription which is a mixture of the triamcinolone cream and Eucerin     ED Prescriptions    Medication Sig Dispense Auth. Provider   triamcinolone (KENALOG) 0.1 % Apply 1 application topically 2 (two) times daily. 45 g Aadon Gorelik C, PA-C   Triamcinolone Acetonide (TRIAMCINOLONE 0.1 % CREAM : EUCERIN) CREA Apply 1 application topically 2 (two) times daily. 1 each Perris Tripathi C, PA-C     I have reviewed the PDMP during this encounter.   Janith Lima, [place] 01/28/21 [phone]

## 2021-02-12 ENCOUNTER — Other Ambulatory Visit: Payer: Self-pay | Admitting: Adult Health

## 2021-02-12 DIAGNOSIS — I10 Essential (primary) hypertension: Secondary | ICD-10-CM

## 2021-03-02 ENCOUNTER — Other Ambulatory Visit: Payer: Self-pay | Admitting: Endocrinology

## 2021-03-29 ENCOUNTER — Other Ambulatory Visit: Payer: Medicare Other

## 2021-04-02 ENCOUNTER — Other Ambulatory Visit: Payer: Medicare Other

## 2021-04-05 ENCOUNTER — Ambulatory Visit: Payer: Medicare Other | Admitting: Endocrinology

## 2021-04-06 ENCOUNTER — Ambulatory Visit (INDEPENDENT_AMBULATORY_CARE_PROVIDER_SITE_OTHER): Payer: Medicare Other | Admitting: Endocrinology

## 2021-04-06 ENCOUNTER — Other Ambulatory Visit: Payer: Self-pay

## 2021-04-06 ENCOUNTER — Encounter: Payer: Self-pay | Admitting: Endocrinology

## 2021-04-06 VITALS — BP 146/82 | HR 109 | Ht 62.0 in | Wt 157.0 lb

## 2021-04-06 DIAGNOSIS — I10 Essential (primary) hypertension: Secondary | ICD-10-CM | POA: Diagnosis not present

## 2021-04-06 DIAGNOSIS — E7800 Pure hypercholesterolemia, unspecified: Secondary | ICD-10-CM | POA: Diagnosis not present

## 2021-04-06 DIAGNOSIS — E038 Other specified hypothyroidism: Secondary | ICD-10-CM

## 2021-04-06 DIAGNOSIS — E1165 Type 2 diabetes mellitus with hyperglycemia: Secondary | ICD-10-CM | POA: Diagnosis not present

## 2021-04-06 LAB — POCT GLYCOSYLATED HEMOGLOBIN (HGB A1C): Hemoglobin A1C: 6.9 % — AB (ref 4.0–5.6)

## 2021-04-06 NOTE — Progress Notes (Signed)
Patient ID: Pam Obrien, female   DOB: 1946-06-04, 75 y.o.   MRN: [id number]     Reason for Appointment: Followup for Type 2 Diabetes   History of Present Illness:          Diagnosis: Type 2 diabetes mellitus, date of diagnosis:  4/13      Past history:  She was initially started on metformin but he thinks that this made her sleepy and had some abdominal discomfort   She also has been tried on other oral hypoglycemic regimens including Jentadueto  since 2014  But she had similar side effects with this  And not clear how regularly she had been taking this until it was discontinued in 5/15  A1c appears to be persistently over 8% Since 2014  She was tried on Invokamet by her PCP but because of excessive urination with this and also some nausea and weakness it was stopped With glyburide alone her blood sugars are poorly controlled and she was referred here for further management She was given Januvia but she did not take this because of expense of the medication   She was referred to the nurse educator and started on basal insulin to improve her control on 06/07/14 This was changed to premixed insulin in 9/15 because of continued poor control and her not being able to afford NovoLog mix insulin or Invokana  She stopped taking insulin in 1/16 because of the cost and was taking only Amaryl.  Recent history:   Hypoglycemic drugs: Glimepiride 4 mg at supper, metformin ER 500 mg in a.m. and pm    Her A1c is again 6.8, has been as low as 6.4  Current management, blood sugar patterns and problems identified:  She was told to take only half glimepiride on her last visit because she had an episode of low blood sugar of 52  However if she forgot and is still taking 4 mg  Has had only 1 low normal sugar of 69  She does try to do a little more activity with dancing to music at home but no outside walking because of safety reasons  She is asking about various foods and drinks  that will make her sugar go up such as apple juice  Weight is gone up 7 pounds recently  She usually refuses to consider brand-name medications  Glucose monitoring with Accu-Chek meter    PRE-MEAL Fasting Lunch Dinner Bedtime Overall  Glucose range:  69-162    79-213 69-213  Mean/median:  116     122     PREVIOUS AVERAGE blood sugar recently 97 with median 189 and range 52-170 with some readings at various times but mostly in the mornings and before lunch   Side effects from medications have been: Metformin higher doses will cause abdominal discomfort  Self-care: The diet that the patient has been following is: tries to limit  portions      Meals:  usually 2-3 meals per day. Usually has variable intake in the morning, sometimes toast/fruit, usually has carbohydrates at dinner time, will have fruit for snacks        Dietician visit: Most recent: 6/17 .              CDE visit last in 7/15  Wt Readings from Last 3 Encounters:  04/06/21 157 lb (71.2 kg)  01/11/21 150 lb 9.6 oz (68.3 kg)  01/03/21 149 lb 6.4 oz (67.8 kg)       Glycemic control:  Lab Results  Component Value Date   HGBA1C 6.9 (A) 04/06/2021   HGBA1C 6.8 (H) 01/01/2021   HGBA1C 6.8 (H) 09/06/2020   Lab Results  Component Value Date   MICROALBUR 1.4 09/06/2020   LDLCALC 100 (H) 01/01/2021   CREATININE 1.15 01/01/2021    Office Visit on 04/06/2021  Component Date Value Ref Range Status  . Hemoglobin A1C 04/06/2021 6.9* 4.0 - 5.6 % Final     Weight history:  Previous range upto 172   Wt Readings from Last 3 Encounters:  04/06/21 157 lb (71.2 kg)  01/11/21 150 lb 9.6 oz (68.3 kg)  01/03/21 149 lb 6.4 oz (67.8 kg)    Allergies as of 04/06/2021      Reactions   Aspirin    REACTION: nervous   Beef-derived Products    Sneezing, nasal drainage, throat scratchy   Celecoxib    REACTION: joints swell   Milk-related Compounds    itching   Mold Extract [trichophyton]    Pt does not know the  reaction.  Positive allergy test per pt.   Nsaids    REACTION: sweling   Peanuts [peanut Oil]    Throat feels scratchy   Shellfish Allergy    Nasal congestion, throat scratchy, eyes watery   Sulfamethoxazole    REACTION: urticaria (hives)   Vioxx [rofecoxib] Swelling      Medication List       Accurate as of Apr 06, 2021  9:23 AM. If you have any questions, ask your nurse or doctor.        Accu-Chek Aviva Plus w/Device Kit Use Accu Chek Aviva Plus to check blood sugar once daily.   Accu-Chek [employer] Kit by Does not apply route. USE LANCET TO CHECK BLOOD SUGAR ONCE DAILY.   Accu-Chek Softclix Lancets lancets USE AS DIRECTED EVERY DAY   acetaminophen 500 MG tablet Commonly known as: TYLENOL Take 1 tablet (500 mg total) by mouth every 6 (six) hours as needed.   atenolol-chlorthalidone 50-25 MG tablet Commonly known as: TENORETIC TAKE 1/2 TABLET BY MOUTH EVERY DAY   calcium citrate-vitamin D 315-200 MG-UNIT tablet Commonly known as: CITRACAL+D Take 1 tablet by mouth daily.   cholecalciferol 1000 units tablet Commonly known as: VITAMIN D Take 2,000 Units by mouth daily.   clotrimazole-betamethasone cream Commonly known as: LOTRISONE Apply 1 application topically 2 (two) times daily.   glimepiride 4 MG tablet Commonly known as: AMARYL TAKE 1 TABLET BY MOUTH DAILY AT SUPPER.   glucose blood test strip Use Accu Chek Aviva test strips as instructed to check blood sugar once daily.   hydrOXYzine 25 MG tablet Commonly known as: ATARAX/VISTARIL Take 25 mg by mouth 3 (three) times daily as needed. Take 1/2 tablet bid for itching   loratadine 10 MG tablet Commonly known as: CLARITIN Take 1 tablet (10 mg total) by mouth 2 (two) times daily as needed for allergies.   metFORMIN 500 MG 24 hr tablet Commonly known as: GLUCOPHAGE-XR TAKE 2 TABLETS BY MOUTH EVERY DAY WITH SUPPER   omeprazole 20 MG capsule Commonly known as: PRILOSEC Take 20 mg by mouth daily.    Se-Tan PLUS 162-115.2-1 MG Caps TAKE 1 CAPSULE BY MOUTH EVERY DAY   simvastatin 20 MG tablet Commonly known as: ZOCOR TAKE 1 TABLET BY MOUTH EVERY DAY   spironolactone 50 MG tablet Commonly known as: ALDACTONE TAKE 1 TABLET BY MOUTH EVERY DAY   triamcinolone 0.1 % cream : eucerin Crea Apply 1 application topically 2 (two)  times daily.   triamcinolone cream 0.1 % Commonly known as: KENALOG Apply 1 application topically 2 (two) times daily.       Allergies:  Allergies  Allergen Reactions  . Aspirin     REACTION: nervous  . Beef-Derived Products     Sneezing, nasal drainage, throat scratchy  . Celecoxib     REACTION: joints swell  . Milk-Related Compounds     itching  . Mold Extract [Trichophyton]     Pt does not know the reaction.  Positive allergy test per pt.  . Nsaids     REACTION: sweling  . Peanuts [Peanut Oil]     Throat feels scratchy  . Shellfish Allergy     Nasal congestion, throat scratchy, eyes watery  . Sulfamethoxazole     REACTION: urticaria (hives)  . Vioxx [Rofecoxib] Swelling    Past Medical History:  Diagnosis Date  . Allergy   . Anemia    as a young adult  . Arthritis   . Diabetes mellitus without complication ([place])   . Hyperlipidemia    was normal range 05/2017 with PCP  . Hypertension     Past Surgical History:  Procedure Laterality Date  . ABDOMINAL HYSTERECTOMY  1994  . COLONOSCOPY  2008  . FLEXIBLE SIGMOIDOSCOPY  2003  . POLYPECTOMY      Family History  Problem Relation Age of Onset  . Hypertension Sister   . Stroke Other   . Diabetes Father   . Hypertension Father   . Stroke Father   . Diabetes Sister   . Diabetes Brother   . Hypertension Brother   . Stroke Brother   . Cancer Mother   . Esophageal cancer Neg Hx   . Rectal cancer Neg Hx   . Stomach cancer Neg Hx   . Colon cancer Neg Hx     Social History:  reports that she has never smoked. She has never used smokeless tobacco. She reports that she does not  drink alcohol and does not use drugs.    Review of Systems    HYPERTENSION: She takes a half a tablet of atenolol/chlorthalidone along with half tablet of spironolactone 50 mg Does not check blood pressure at home However recently ran out of her medications  This is followed by her PCP  BP Readings from Last 3 Encounters:  04/06/21 (!) 146/82  01/27/21 (!) 154/63  01/11/21 130/82    HYPOKALEMIA: This is secondary to HCTZ controlled with Aldactone Taking a half of a 50 mg tablet daily  Lab Results  Component Value Date   K 4.7 01/01/2021     Lab Results  Component Value Date   CREATININE 1.15 01/01/2021   CREATININE 1.21 (H) 09/06/2020   CREATININE 1.10 (H) 08/17/2020      Lipids: She did not take pravastatin previously prescribed because of cost Started simvastatin in 7/19   She is still thinks that she will have pain in her arm at times with simvastatin either on the right or the left but this is tolerable  LDL has improved as of last labs  She does have family history of CVA but no known history of CAD    Lab Results  Component Value Date   CHOL 170 01/01/2021   CHOL 212 (H) 08/17/2020   CHOL 199 05/10/2020   Lab Results  Component Value Date   HDL 47.70 01/01/2021   HDL 54 08/17/2020   HDL 48.80 05/10/2020   Lab Results  Component Value Date  LDLCALC 100 (H) 01/01/2021   LDLCALC 138 (H) 08/17/2020   LDLCALC 131 (H) 05/10/2020   Lab Results  Component Value Date   TRIG 113.0 01/01/2021   TRIG 101 08/17/2020   TRIG 99.0 05/10/2020   Lab Results  Component Value Date   CHOLHDL 4 01/01/2021   CHOLHDL 3.9 08/17/2020   CHOLHDL 4 05/10/2020   Lab Results  Component Value Date   LDLDIRECT 98.0 12/02/2019   LDLDIRECT 130.0 05/17/2016   LDLDIRECT 121.7 03/04/2012     Last foot exam was in 12/2018, she does not complain of any  symptoms of neuropathy  She has had routine thyroid tests and TSH has been periodically above normal Currently  without any supplementation her TSH is back to normal  Lab Results  Component Value Date   TSH 1.98 01/01/2021   TSH 4.77 (H) 10/20/2020   TSH 5.54 (H) 08/17/2020   FREET4 0.78 01/01/2021   FREET4 1.0 10/20/2020   FREET4 0.9 06/02/2009      Physical Examination:  BP (!) 146/82 (BP Location: Left Arm, Patient Position: Sitting, Cuff Size: Normal)   Pulse (!) 109   Ht _0  (1.575 m)   Wt 157 lb (71.2 kg)   SpO2 97%   BMI 28.72 kg/m       ASSESSMENT:  Diabetes type 2 BMI under 30, non-insulin-dependent  See history of present illness for detailed discussion of  current management, blood sugar patterns and problems identified  A1c is 6.8 consistently  She is on Amaryl and Metformin Weight has gone up likely from increased calorie intake She is trying to do a little more physical activity Blood sugars are only slightly higher at times likely from certain foods and drinks like apple juice  LIPIDS: To follow-up on the next visit  History of hypokalemia: Controlled with Aldactone and will continue the 25 mg dose  HYPERTENSION: Blood pressure relatively higher from her running out of her Tenoretic and she will contact her PCP regarding this  PLAN:    Reduce Amaryl to half tablet if she has low normal sugars again in the morning  She will see the dietitian for meal planning and discussed cutting back on total calories  She will avoid juices especially apple juice and preferably have small portions of fluids  Emphasized the need to take her blood pressure medicine regularly  Periodically check thyroid levels  Reminded her to take simvastatin regularly  Regular exercise at home    There are no Patient Instructions on file for this visit.      Elayne Snare 04/06/2021, 9:23 AM   Note: This office note was prepared with Dragon voice recognition system technology. Any transcriptional errors that result from this process are unintentional.

## 2021-04-06 NOTE — Patient Instructions (Addendum)
Walk, dance daily  Check blood sugars on waking up 3 days a week  Also check blood sugars about 2 hours after meals and do this after different meals by rotation  Recommended blood sugar levels on waking up are 90-120 and about 2 hours after meal is 130-160  Please bring your blood sugar monitor to each visit, thank you  If sugars <70 take only 1/2 Glimeperide

## 2021-04-09 ENCOUNTER — Other Ambulatory Visit: Payer: Self-pay | Admitting: Endocrinology

## 2021-04-11 ENCOUNTER — Ambulatory Visit: Payer: Medicare Other | Admitting: Endocrinology

## 2021-04-13 ENCOUNTER — Other Ambulatory Visit: Payer: Self-pay | Admitting: Endocrinology

## 2021-06-08 ENCOUNTER — Ambulatory Visit: Payer: Medicare Other | Admitting: Dietician

## 2021-06-13 ENCOUNTER — Other Ambulatory Visit: Payer: Self-pay | Admitting: Endocrinology

## 2021-07-03 ENCOUNTER — Ambulatory Visit (INDEPENDENT_AMBULATORY_CARE_PROVIDER_SITE_OTHER): Payer: Medicare Other

## 2021-07-03 DIAGNOSIS — Z0000 Encounter for general adult medical examination without abnormal findings: Secondary | ICD-10-CM

## 2021-07-03 NOTE — Patient Instructions (Signed)
Pam Obrien , Thank you for taking time to come for your Medicare Wellness Visit. I appreciate your ongoing commitment to your health goals. Please review the following plan we discussed and let me know if I can assist you in the future.   Screening recommendations/referrals: Colonoscopy: no longer required  Mammogram: no longer required  Bone Density: 10/19/2020 Recommended yearly ophthalmology/optometry visit for glaucoma screening and checkup Recommended yearly dental visit for hygiene and checkup  Vaccinations: Influenza vaccine: due in fall 2022  Pneumococcal vaccine: completed series  Tdap vaccine: due upon injury  Shingles vaccine: will consider   Advanced directives: none   Conditions/risks identified: none   Next appointment: 08/03/2021  CPE Pam Obrien 0900am   Preventive Care 76 Years and Older, Female Preventive care refers to lifestyle choices and visits with your health care provider that can promote health and wellness. What does preventive care include? A yearly physical exam. This is also called an annual well check. Dental exams once or twice a year. Routine eye exams. Ask your health care provider how often you should have your eyes checked. Personal lifestyle choices, including: Daily care of your teeth and gums. Regular physical activity. Eating a healthy diet. Avoiding tobacco and drug use. Limiting alcohol use. Practicing safe sex. Taking low-dose aspirin every day. Taking vitamin and mineral supplements as recommended by your health care provider. What happens during an annual well check? The services and screenings done by your health care provider during your annual well check will depend on your age, overall health, lifestyle risk factors, and family history of disease. Counseling  Your health care provider may ask you questions about your: Alcohol use. Tobacco use. Drug use. Emotional well-being. Home and relationship well-being. Sexual  activity. Eating habits. History of falls. Memory and ability to understand (cognition). Work and work Statistician. Reproductive health. Screening  You may have the following tests or measurements: Height, weight, and BMI. Blood pressure. Lipid and cholesterol levels. These may be checked every 5 years, or more frequently if you are over 52 years old. Skin check. Lung cancer screening. You may have this screening every year starting at age 52 if you have a 30-pack-year history of smoking and currently smoke or have quit within the past 15 years. Fecal occult blood test (FOBT) of the stool. You may have this test every year starting at age 60. Flexible sigmoidoscopy or colonoscopy. You may have a sigmoidoscopy every 5 years or a colonoscopy every 10 years starting at age 93. Hepatitis C blood test. Hepatitis B blood test. Sexually transmitted disease (STD) testing. Diabetes screening. This is done by checking your blood sugar (glucose) after you have not eaten for a while (fasting). You may have this done every 1-3 years. Bone density scan. This is done to screen for osteoporosis. You may have this done starting at age 39. Mammogram. This may be done every 1-2 years. Talk to your health care provider about how often you should have regular mammograms. Talk with your health care provider about your test results, treatment options, and if necessary, the need for more tests. Vaccines  Your health care provider may recommend certain vaccines, such as: Influenza vaccine. This is recommended every year. Tetanus, diphtheria, and acellular pertussis (Tdap, Td) vaccine. You may need a Td booster every 10 years. Zoster vaccine. You may need this after age 44. Pneumococcal 13-valent conjugate (PCV13) vaccine. One dose is recommended after age 65. Pneumococcal polysaccharide (PPSV23) vaccine. One dose is recommended after age 49. Talk  to your health care provider about which screenings and vaccines  you need and how often you need them. This information is not intended to replace advice given to you by your health care provider. Make sure you discuss any questions you have with your health care provider. Document Released: 11/24/2015 Document Revised: 07/17/2016 Document Reviewed: 08/29/2015 Elsevier Interactive Patient Education  2017  Prevention in the Home Falls can cause injuries. They can happen to people of all ages. There are many things you can do to make your home safe and to help prevent falls. What can I do on the outside of my home? Regularly fix the edges of walkways and driveways and fix any cracks. Remove anything that might make you trip as you walk through a door, such as a raised step or threshold. Trim any bushes or trees on the path to your home. Use bright outdoor lighting. Clear any walking paths of anything that might make someone trip, such as rocks or tools. Regularly check to see if handrails are loose or broken. Make sure that both sides of any steps have handrails. Any raised decks and porches should have guardrails on the edges. Have any leaves, snow, or ice cleared regularly. Use sand or salt on walking paths during winter. Clean up any spills in your garage right away. This includes oil or grease spills. What can I do in the bathroom? Use night lights. Install grab bars by the toilet and in the tub and shower. Do not use towel bars as grab bars. Use non-skid mats or decals in the tub or shower. If you need to sit down in the shower, use a plastic, non-slip stool. Keep the floor dry. Clean up any water that spills on the floor as soon as it happens. Remove soap buildup in the tub or shower regularly. Attach bath mats securely with double-sided non-slip rug tape. Do not have throw rugs and other things on the floor that can make you trip. What can I do in the bedroom? Use night lights. Make sure that you have a light by your bed that  is easy to reach. Do not use any sheets or blankets that are too big for your bed. They should not hang down onto the floor. Have a firm chair that has side arms. You can use this for support while you get dressed. Do not have throw rugs and other things on the floor that can make you trip. What can I do in the kitchen? Clean up any spills right away. Avoid walking on wet floors. Keep items that you use a lot in easy-to-reach places. If you need to reach something above you, use a strong step stool that has a grab bar. Keep electrical cords out of the way. Do not use floor polish or wax that makes floors slippery. If you must use wax, use non-skid floor wax. Do not have throw rugs and other things on the floor that can make you trip. What can I do with my stairs? Do not leave any items on the stairs. Make sure that there are handrails on both sides of the stairs and use them. Fix handrails that are broken or loose. Make sure that handrails are as long as the stairways. Check any carpeting to make sure that it is firmly attached to the stairs. Fix any carpet that is loose or worn. Avoid having throw rugs at the top or bottom of the stairs. If you do have throw rugs,  attach them to the floor with carpet tape. Make sure that you have a light switch at the top of the stairs and the bottom of the stairs. If you do not have them, ask someone to add them for you. What else can I do to help prevent falls? Wear shoes that: Do not have high heels. Have rubber bottoms. Are comfortable and fit you well. Are closed at the toe. Do not wear sandals. If you use a stepladder: Make sure that it is fully opened. Do not climb a closed stepladder. Make sure that both sides of the stepladder are locked into place. Ask someone to hold it for you, if possible. Clearly mark and make sure that you can see: Any grab bars or handrails. First and last steps. Where the edge of each step is. Use tools that help you  move around (mobility aids) if they are needed. These include: Canes. Walkers. Scooters. Crutches. Turn on the lights when you go into a dark area. Replace any light bulbs as soon as they burn out. Set up your furniture so you have a clear path. Avoid moving your furniture around. If any of your floors are uneven, fix them. If there are any pets around you, be aware of where they are. Review your medicines with your doctor. Some medicines can make you feel dizzy. This can increase your chance of falling. Ask your doctor what other things that you can do to help prevent falls. This information is not intended to replace advice given to you by your health care provider. Make sure you discuss any questions you have with your health care provider. Document Released: 08/24/2009 Document Revised: 04/04/2016 Document Reviewed: 12/02/2014 Elsevier Interactive Patient Education  2017 [employer].

## 2021-07-03 NOTE — Progress Notes (Signed)
Subjective:   Pam Obrien is a 75 y.o. female who presents for Medicare Annual (Subsequent) preventive examination.  I connected with Owens Loffler today by telephone and verified that I am speaking with the correct person using two identifiers. Location patient: home Location provider: work Persons participating in the virtual visit: patient, provider.   I discussed the limitations, risks, security and privacy concerns of performing an evaluation and management service by telephone and the availability of in person appointments. I also discussed with the patient that there may be a patient responsible charge related to this service. The patient expressed understanding and verbally consented to this telephonic visit.    Interactive audio and video telecommunications were attempted between this provider and patient, however failed, due to patient having technical difficulties OR patient did not have access to video capability.  We continued and completed visit with audio only.    Review of Systems    N/a       Objective:    There were no vitals filed for this visit. There is no height or weight on file to calculate BMI.  Advanced Directives 06/30/2020 07/03/2017 05/27/2017 01/22/2017 03/22/2016  Does Patient Have a Medical Advance Directive? No No No No No  Would patient like information on creating a medical advance directive? No - Patient declined - Yes (MAU/Ambulatory/Procedural Areas - Information given) - No - patient declined information    Current Medications (verified) Outpatient Encounter Medications as of 07/03/2021  Medication Sig   ACCU-CHEK GUIDE test strip USE ACCU CHEK AVIVA TEST STRIPS AS INSTRUCTED TO CHECK BLOOD SUGAR ONCE DAILY.   Accu-Chek Softclix Lancets lancets USE AS DIRECTED EVERY DAY   acetaminophen (TYLENOL) 500 MG tablet Take 1 tablet (500 mg total) by mouth every 6 (six) hours as needed.   atenolol-chlorthalidone (TENORETIC) 50-25 MG tablet TAKE  1/2 TABLET BY MOUTH EVERY DAY   Blood Glucose Monitoring Suppl (ACCU-CHEK AVIVA PLUS) w/Device KIT Use Accu Chek Aviva Plus to check blood sugar once daily.   calcium citrate-vitamin D (CITRACAL+D) 315-200 MG-UNIT per tablet Take 1 tablet by mouth daily.   cholecalciferol (VITAMIN D) 1000 UNITS tablet Take 2,000 Units by mouth daily.   clotrimazole-betamethasone (LOTRISONE) cream Apply 1 application topically 2 (two) times daily.   FeFum-FePo-FA-B Cmp-C-Zn-Mn-Cu (SE-TAN PLUS) 162-115.2-1 MG CAPS TAKE 1 CAPSULE BY MOUTH EVERY DAY   glimepiride (AMARYL) 4 MG tablet TAKE 1 TABLET BY MOUTH DAILY AT SUPPER.   hydrOXYzine (ATARAX/VISTARIL) 25 MG tablet Take 25 mg by mouth 3 (three) times daily as needed. Take 1/2 tablet bid for itching   Lancets Misc. (ACCU-CHEK FASTCLIX LANCET) KIT by Does not apply route. USE LANCET TO CHECK BLOOD SUGAR ONCE DAILY.   loratadine (CLARITIN) 10 MG tablet Take 1 tablet (10 mg total) by mouth 2 (two) times daily as needed for allergies.   metFORMIN (GLUCOPHAGE-XR) 500 MG 24 hr tablet TAKE 2 TABLETS BY MOUTH EVERY DAY WITH SUPPER   omeprazole (PRILOSEC) 20 MG capsule Take 20 mg by mouth daily.   simvastatin (ZOCOR) 20 MG tablet TAKE 1 TABLET BY MOUTH EVERY DAY   spironolactone (ALDACTONE) 50 MG tablet TAKE 1 TABLET BY MOUTH EVERY DAY   triamcinolone (KENALOG) 0.1 % Apply 1 application topically 2 (two) times daily.   Triamcinolone Acetonide (TRIAMCINOLONE 0.1 % CREAM : EUCERIN) CREA Apply 1 application topically 2 (two) times daily.   No facility-administered encounter medications on file as of 07/03/2021.    Allergies (verified) Aspirin, Beef-derived products, Celecoxib, Milk-related  compounds, Mold extract [trichophyton], Nsaids, Peanuts [peanut oil], Shellfish allergy, Sulfamethoxazole, and Vioxx [rofecoxib]   History: Past Medical History:  Diagnosis Date   Allergy    Anemia    as a young adult   Arthritis    Diabetes mellitus without complication ([place])     Hyperlipidemia    was normal range 05/2017 with PCP   Hypertension    Past Surgical History:  Procedure Laterality Date   ABDOMINAL HYSTERECTOMY  1994   COLONOSCOPY  2008   FLEXIBLE SIGMOIDOSCOPY  2003   POLYPECTOMY     Family History  Problem Relation Age of Onset   Hypertension Sister    Stroke Other    Diabetes Father    Hypertension Father    Stroke Father    Diabetes Sister    Diabetes Brother    Hypertension Brother    Stroke Brother    Cancer Mother    Esophageal cancer Neg Hx    Rectal cancer Neg Hx    Stomach cancer Neg Hx    Colon cancer Neg Hx    Social History   Socioeconomic History   Marital status: Married    Spouse name: Not on file   Number of children: Not on file   Years of education: Not on file   Highest education level: Not on file  Occupational History   Not on file  Tobacco Use   Smoking status: Never   Smokeless tobacco: Never  Vaping Use   Vaping Use: Never used  Substance and Sexual Activity   Alcohol use: No   Drug use: No   Sexual activity: Not on file  Other Topics Concern   Not on file  Social History Narrative   Retired from [employer] work    Separated for 9 years.    Five children, two live locally, three are in [place]   One of her daughters live with her   Has a dog.    She likes to dance and walk   Social Determinants of Health   Financial Resource Strain: Not on file  Food Insecurity: Not on file  Transportation Needs: Not on file  Physical Activity: Not on file  Stress: Not on file  Social Connections: Not on file    Tobacco Counseling Counseling given: Not Answered   Clinical Intake:                 Diabetic?yes Nutrition Risk Assessment:  Has the patient had any N/V/D within the last 2 months?  No  Does the patient have any non-healing wounds?  No  Has the patient had any unintentional weight loss or weight gain?  No   Diabetes:  Is the patient diabetic?  Yes  If diabetic, was a CBG  obtained today?  No  Did the patient bring in their glucometer from home?  No  How often do you monitor your CBG's? 2 to 3 week .   Financial Strains and Diabetes Management:  Are you having any financial strains with the device, your supplies or your medication? No .  Does the patient want to be seen by Chronic Care Management for management of their diabetes?  No  Would the patient like to be referred to a Nutritionist or for Diabetic Management?  No   Diabetic Exams:  Diabetic Eye Exam: Completed 06/2020 Diabetic Foot Exam: Overdue, Pt has been advised about the importance in completing this exam. Pt is scheduled for diabetic foot exam on next office visit .  Activities of Daily Living No flowsheet data found.  Patient Care Team: [name], NP as PCP - General (Family Medicine)  Indicate any recent Medical Services you may have received from other than [facility] providers in the past year (date may be approximate).     Assessment:   This is a routine wellness examination for Pam Obrien.  Hearing/Vision screen No results found.  Dietary issues and exercise activities discussed:     Goals Addressed   None    Depression Screen PHQ 2/9 Scores 08/17/2020 06/30/2020 04/24/2018 01/22/2017 03/22/2016 08/07/2015 01/06/2015  PHQ - 2 Score 0 0 0 0 0 0 0  PHQ- 9 Score - 0 - - - - -    Fall Risk Fall Risk  08/17/2020 06/30/2020 04/24/2018 01/22/2017 03/22/2016  Falls in the past year? 0 0 No No No  Number falls in past yr: 0 0 - - -  Injury with Fall? 0 0 - - -  Risk for fall due to : - Medication side effect - - -  Follow up - Falls evaluation completed;Falls prevention discussed - - -    FALL RISK PREVENTION PERTAINING TO THE HOME:  Any stairs in or around the home? No  If so, are there any without handrails? No  Home free of loose throw rugs in walkways, pet beds, electrical cords, etc? Yes  Adequate lighting in your home to reduce risk of falls? Yes   ASSISTIVE  DEVICES UTILIZED TO PREVENT FALLS:  Life alert? No  Use of a cane, walker or w/c? No  Grab bars in the bathroom? No  Shower chair or bench in shower? No  Elevated toilet seat or a handicapped toilet? Yes    Cognitive Function: Normal cognitive status assessed by direct observation by this Nurse Health Advisor. No abnormalities found.   MMSE - Mini Mental State Exam 01/22/2017  Not completed: (No Data)     6CIT Screen 06/30/2020  What Year? 0 points  What month? 0 points  What time? 0 points  Count back from 20 0 points  Months in reverse 0 points  Repeat phrase 2 points  Total Score 2    Immunizations Immunization History  Administered Date(s) Administered   Influenza Split 09/03/2012   Pneumococcal Conjugate-13 06/06/2015   Pneumococcal Polysaccharide-23 12/28/2009   Td 11/11/2006    TDAP status: Due, Education has been provided regarding the importance of this vaccine. Advised may receive this vaccine at local pharmacy or Health Dept. Aware to provide a copy of the vaccination record if obtained from local pharmacy or Health Dept. Verbalized acceptance and understanding.  Flu Vaccine status: Up to date  Pneumococcal vaccine status: Up to date  Covid-19 vaccine status: Completed vaccines  Qualifies for Shingles Vaccine? Yes   Zostavax completed No   Shingrix Completed?: No.    Education has been provided regarding the importance of this vaccine. Patient has been advised to call insurance company to determine out of pocket expense if they have not yet received this vaccine. Advised may also receive vaccine at local pharmacy or Health Dept. Verbalized acceptance and understanding.  Screening Tests Health Maintenance  Topic Date Due   COVID-19 Vaccine (1) Never done   Zoster Vaccines- Shingrix (1 of 2) Never done   TETANUS/TDAP  11/11/2016   INFLUENZA VACCINE  06/11/2021   OPHTHALMOLOGY EXAM  06/21/2021   FOOT EXAM  08/17/2021   HEMOGLOBIN A1C  10/07/2021   DEXA  SCAN  Completed   Hepatitis C Screening  Completed  PNA vac Low Risk Adult  Completed   HPV VACCINES  Aged Out    Health Maintenance  Health Maintenance Due  Topic Date Due   COVID-19 Vaccine (1) Never done   Zoster Vaccines- Shingrix (1 of 2) Never done   TETANUS/TDAP  11/11/2016   INFLUENZA VACCINE  06/11/2021   OPHTHALMOLOGY EXAM  06/21/2021    Colorectal cancer screening: No longer required.   Mammogram status: No longer required due to age.  Bone Density status: Completed 10/19/2020. Results reflect: Bone density results: OSTEOPENIA. Repeat every 5 years.  Lung Cancer Screening: (Low Dose CT Chest recommended if Age 51-80 years, 30 pack-year currently smoking OR have quit w/in 15years.) does not qualify.   Lung Cancer Screening Referral: n/a  Additional Screening:  Hepatitis C Screening: does not qualify; Completed 06/17/2018  Vision Screening: Recommended annual ophthalmology exams for early detection of glaucoma and other disorders of the eye. Is the patient up to date with their annual eye exam?  Yes  Who is the provider or what is the name of the office in which the patient attends annual eye exams? Dr.Miller If pt is not established with a provider, would they like to be referred to a provider to establish care? No .   Dental Screening: Recommended annual dental exams for proper oral hygiene  Community Resource Referral / Chronic Care Management: CRR required this visit?  No   CCM required this visit?  No      Plan:     I have personally reviewed and noted the following in the patient's chart:   Medical and social history Use of alcohol, tobacco or illicit drugs  Current medications and supplements including opioid prescriptions.  Functional ability and status Nutritional status Physical activity Advanced directives List of other physicians Hospitalizations, surgeries, and ER visits in previous 12 months Vitals Screenings to include cognitive,  depression, and falls Referrals and appointments  In addition, I have reviewed and discussed with patient certain preventive protocols, quality metrics, and best practice recommendations. A written personalized care plan for preventive services as well as general preventive health recommendations were provided to patient.     Randel Pigg, LPN   6/41/5830   Nurse Notes: none

## 2021-07-12 ENCOUNTER — Ambulatory Visit: Payer: Medicare Other

## 2021-07-12 ENCOUNTER — Other Ambulatory Visit: Payer: Self-pay

## 2021-07-12 VITALS — BP 132/78 | HR 78 | Temp 98.1°F | Ht 62.0 in | Wt 151.0 lb

## 2021-07-12 DIAGNOSIS — Z0000 Encounter for general adult medical examination without abnormal findings: Secondary | ICD-10-CM

## 2021-07-12 NOTE — Progress Notes (Signed)
Subjective:   Pam Obrien is a 75 y.o. female who presents for Medicare Annual (Subsequent) preventive examination.  Review of Systems    N/a       Objective:    There were no vitals filed for this visit. There is no height or weight on file to calculate BMI.  Advanced Directives 07/03/2021 06/30/2020 07/03/2017 05/27/2017 01/22/2017 03/22/2016  Does Patient Have a Medical Advance Directive? No No No No No No  Would patient like information on creating a medical advance directive? No - Patient declined No - Patient declined - Yes (MAU/Ambulatory/Procedural Areas - Information given) - No - patient declined information    Current Medications (verified) Outpatient Encounter Medications as of 07/12/2021  Medication Sig   ACCU-CHEK GUIDE test strip USE ACCU CHEK AVIVA TEST STRIPS AS INSTRUCTED TO CHECK BLOOD SUGAR ONCE DAILY.   Accu-Chek Softclix Lancets lancets USE AS DIRECTED EVERY DAY   acetaminophen (TYLENOL) 500 MG tablet Take 1 tablet (500 mg total) by mouth every 6 (six) hours as needed.   atenolol-chlorthalidone (TENORETIC) 50-25 MG tablet TAKE 1/2 TABLET BY MOUTH EVERY DAY   Blood Glucose Monitoring Suppl (ACCU-CHEK AVIVA PLUS) w/Device KIT Use Accu Chek Aviva Plus to check blood sugar once daily.   calcium citrate-vitamin D (CITRACAL+D) 315-200 MG-UNIT per tablet Take 1 tablet by mouth daily.   cholecalciferol (VITAMIN D) 1000 UNITS tablet Take 2,000 Units by mouth daily.   clotrimazole-betamethasone (LOTRISONE) cream Apply 1 application topically 2 (two) times daily. (Patient not taking: Reported on 07/03/2021)   FeFum-FePo-FA-B Cmp-C-Zn-Mn-Cu (SE-TAN PLUS) 162-115.2-1 MG CAPS TAKE 1 CAPSULE BY MOUTH EVERY DAY   glimepiride (AMARYL) 4 MG tablet TAKE 1 TABLET BY MOUTH DAILY AT SUPPER.   hydrOXYzine (ATARAX/VISTARIL) 25 MG tablet Take 25 mg by mouth 3 (three) times daily as needed. Take 1/2 tablet bid for itching (Patient not taking: Reported on 07/03/2021)   Lancets Misc.  (ACCU-CHEK FASTCLIX LANCET) KIT by Does not apply route. USE LANCET TO CHECK BLOOD SUGAR ONCE DAILY.   loratadine (CLARITIN) 10 MG tablet Take 1 tablet (10 mg total) by mouth 2 (two) times daily as needed for allergies.   metFORMIN (GLUCOPHAGE-XR) 500 MG 24 hr tablet TAKE 2 TABLETS BY MOUTH EVERY DAY WITH SUPPER   omeprazole (PRILOSEC) 20 MG capsule Take 20 mg by mouth daily.   simvastatin (ZOCOR) 20 MG tablet TAKE 1 TABLET BY MOUTH EVERY DAY   spironolactone (ALDACTONE) 50 MG tablet TAKE 1 TABLET BY MOUTH EVERY DAY   triamcinolone (KENALOG) 0.1 % Apply 1 application topically 2 (two) times daily.   Triamcinolone Acetonide (TRIAMCINOLONE 0.1 % CREAM : EUCERIN) CREA Apply 1 application topically 2 (two) times daily.   No facility-administered encounter medications on file as of 07/12/2021.    Allergies (verified) Aspirin, Beef-derived products, Celecoxib, Milk-related compounds, Mold extract [trichophyton], Nsaids, Peanuts [peanut oil], Shellfish allergy, Sulfamethoxazole, and Vioxx [rofecoxib]   History: Past Medical History:  Diagnosis Date   Allergy    Anemia    as a young adult   Arthritis    Diabetes mellitus without complication ([place])    Hyperlipidemia    was normal range 05/2017 with PCP   Hypertension    Past Surgical History:  Procedure Laterality Date   ABDOMINAL HYSTERECTOMY  1994   COLONOSCOPY  2008   FLEXIBLE SIGMOIDOSCOPY  2003   POLYPECTOMY     Family History  Problem Relation Age of Onset   Hypertension Sister    Stroke Other  Diabetes Father    Hypertension Father    Stroke Father    Diabetes Sister    Diabetes Brother    Hypertension Brother    Stroke Brother    Cancer Mother    Esophageal cancer Neg Hx    Rectal cancer Neg Hx    Stomach cancer Neg Hx    Colon cancer Neg Hx    Social History   Socioeconomic History   Marital status: Married    Spouse name: Not on file   Number of children: Not on file   Years of education: Not on file    Highest education level: Not on file  Occupational History   Not on file  Tobacco Use   Smoking status: Never   Smokeless tobacco: Never  Vaping Use   Vaping Use: Never used  Substance and Sexual Activity   Alcohol use: No   Drug use: No   Sexual activity: Not on file  Other Topics Concern   Not on file  Social History Narrative   Retired from [employer] work    Separated for 9 years.    Five children, two live locally, three are in [place]   One of her daughters live with her   Has a dog.    She likes to dance and walk   Social Determinants of Health   Financial Resource Strain: Low Risk    Difficulty of Paying Living Expenses: Not hard at all  Food Insecurity: No Food Insecurity   Worried About Charity fundraiser in the Last Year: Never true   Arboriculturist in the Last Year: Never true  Transportation Needs: No Transportation Needs   Lack of Transportation (Medical): No   Lack of Transportation (Non-Medical): No  Physical Activity: Insufficiently Active   Days of Exercise per Week: 3 days   Minutes of Exercise per Session: 30 min  Stress: No Stress Concern Present   Feeling of Stress : Not at all  Social Connections: Socially Isolated   Frequency of Communication with Friends and Family: Twice a week   Frequency of Social Gatherings with Friends and Family: Twice a week   Attends Religious Services: Never   Marine scientist or Organizations: No   Attends Music therapist: Never   Marital Status: Divorced    Tobacco Counseling Counseling given: Not Answered   Clinical Intake:                 Diabetic?yes   Nutrition Risk Assessment:  Has the patient had any N/V/D within the last 2 months?  No  Does the patient have any non-healing wounds?  No  Has the patient had any unintentional weight loss or weight gain?  No   Diabetes:  Is the patient diabetic?  Yes  If diabetic, was a CBG obtained today?  No  Did the patient bring in  their glucometer from home?  No  How often do you monitor your CBG's? Patient is checking .   Financial Strains and Diabetes Management:  Are you having any financial strains with the device, your supplies or your medication? No .  Does the patient want to be seen by Chronic Care Management for management of their diabetes?  No  Would the patient like to be referred to a Nutritionist or for Diabetic Management?  No   Diabetic Exams:  Diabetic Eye Exam: Completed 02/2020 Diabetic Foot Exam: Overdue, Pt has been advised about the importance in completing this  exam. Pt is scheduled for diabetic foot exam on next office visit .          Activities of Daily Living In your present state of health, do you have any difficulty performing the following activities: 07/03/2021  Hearing? N  Vision? N  Difficulty concentrating or making decisions? N  Walking or climbing stairs? N  Dressing or bathing? N  Doing errands, shopping? N  Preparing Food and eating ? N  Using the Toilet? N  In the past six months, have you accidently leaked urine? N  Do you have problems with loss of bowel control? N  Managing your Medications? N  Managing your Finances? N  Housekeeping or managing your Housekeeping? N  Some recent data might be hidden    Patient Care Team: [name], NP as PCP - General (Family Medicine)  Indicate any recent Medical Services you may have received from other than [facility] providers in the past year (date may be approximate).     Assessment:   This is a routine wellness examination for [place].  Hearing/Vision screen No results found.  Dietary issues and exercise activities discussed:     Goals Addressed   None    Depression Screen PHQ 2/9 Scores 07/03/2021 07/03/2021 08/17/2020 06/30/2020 04/24/2018 01/22/2017 03/22/2016  PHQ - 2 Score 0 0 0 0 0 0 0  PHQ- 9 Score - - - 0 - - -    Fall Risk Fall Risk  07/03/2021 08/17/2020 06/30/2020 04/24/2018 01/22/2017  Falls in the  past year? 0 0 0 No No  Number falls in past yr: 0 0 0 - -  Injury with Fall? 0 0 0 - -  Risk for fall due to : No Fall Risks - Medication side effect - -  Follow up Falls evaluation completed - Falls evaluation completed;Falls prevention discussed - -    FALL RISK PREVENTION PERTAINING TO THE HOME:  Any stairs in or around the home? No  If so, are there any without handrails? No  Home free of loose throw rugs in walkways, pet beds, electrical cords, etc? Yes  Adequate lighting in your home to reduce risk of falls? Yes   ASSISTIVE DEVICES UTILIZED TO PREVENT FALLS:  Life alert? No  Use of a cane, walker or w/c? No  Grab bars in the bathroom? No  Shower chair or bench in shower? No  Elevated toilet seat or a handicapped toilet? No   TIMED UP AND GO:  Was the test performed? Yes .  Length of time to ambulate 10 feet: 7 sec.   Gait steady and fast without use of assistive device  Cognitive Function: Normal cognitive status assessed by direct observation by this Nurse Health Advisor. No abnormalities found.   MMSE - Mini Mental State Exam 01/22/2017  Not completed: (No Data)     6CIT Screen 06/30/2020  What Year? 0 points  What month? 0 points  What time? 0 points  Count back from 20 0 points  Months in reverse 0 points  Repeat phrase 2 points  Total Score 2    Immunizations Immunization History  Administered Date(s) Administered   Influenza Split 09/03/2012   Pneumococcal Conjugate-13 06/06/2015   Pneumococcal Polysaccharide-23 12/28/2009   Td 11/11/2006    TDAP status: Due, Education has been provided regarding the importance of this vaccine. Advised may receive this vaccine at local pharmacy or Health Dept. Aware to provide a copy of the vaccination record if obtained from local pharmacy or Health  Dept. Verbalized acceptance and understanding.  Flu Vaccine status: Up to date  Pneumococcal vaccine status: Up to date  Covid-19 vaccine status: Completed  vaccines  Qualifies for Shingles Vaccine? Yes   Zostavax completed No   Shingrix Completed?: No.    Education has been provided regarding the importance of this vaccine. Patient has been advised to call insurance company to determine out of pocket expense if they have not yet received this vaccine. Advised may also receive vaccine at local pharmacy or Health Dept. Verbalized acceptance and understanding.  Screening Tests Health Maintenance  Topic Date Due   COVID-19 Vaccine (1) Never done   Zoster Vaccines- Shingrix (1 of 2) Never done   TETANUS/TDAP  11/11/2016   INFLUENZA VACCINE  06/11/2021   OPHTHALMOLOGY EXAM  06/21/2021   FOOT EXAM  08/17/2021   HEMOGLOBIN A1C  10/07/2021   DEXA SCAN  Completed   Hepatitis C Screening  Completed   PNA vac Low Risk Adult  Completed   HPV VACCINES  Aged Out    Health Maintenance  Health Maintenance Due  Topic Date Due   COVID-19 Vaccine (1) Never done   Zoster Vaccines- Shingrix (1 of 2) Never done   TETANUS/TDAP  11/11/2016   INFLUENZA VACCINE  06/11/2021   OPHTHALMOLOGY EXAM  06/21/2021    Colorectal cancer screening: No longer required.   Mammogram status: No longer required due to age .  Bone Density status: Completed 10/19/2020. Results reflect: Bone density results: OSTEOPENIA. Repeat every 5 years.  Lung Cancer Screening: (Low Dose CT Chest recommended if Age 75-80 years, 30 pack-year currently smoking OR have quit w/in 15years.) does not qualify.   Lung Cancer Screening Referral: n/a  Additional Screening:  Hepatitis C Screening: does not qualify; Completed 06/17/2018  Vision Screening: Recommended annual ophthalmology exams for early detection of glaucoma and other disorders of the eye. Is the patient up to date with their annual eye exam?  Yes  Who is the provider or what is the name of the office in which the patient attends annual eye exams? Dr.Miller  If pt is not established with a provider, would they like to be  referred to a provider to establish care? No .   Dental Screening: Recommended annual dental exams for proper oral hygiene  Community Resource Referral / Chronic Care Management: CRR required this visit?  No   CCM required this visit?  No      Plan:     I have personally reviewed and noted the following in the patient's chart:   Medical and social history Use of alcohol, tobacco or illicit drugs  Current medications and supplements including opioid prescriptions.  Functional ability and status Nutritional status Physical activity Advanced directives List of other physicians Hospitalizations, surgeries, and ER visits in previous 12 months Vitals Screenings to include cognitive, depression, and falls Referrals and appointments  In addition, I have reviewed and discussed with patient certain preventive protocols, quality metrics, and best practice recommendations. A written personalized care plan for preventive services as well as general preventive health recommendations were provided to patient.     Randel Pigg, LPN   07/16/6386   Nurse Notes: none

## 2021-07-12 NOTE — Patient Instructions (Signed)
Pam Obrien ,/ Thank you for taking time to come for your Medicare Wellness Visit. I appreciate your ongoing commitment to your health goals. Please review the following plan we discussed and let me know if I can assist you in the future.   Screening recommendations/referrals: Colonoscopy: 07/24/2017  due 2028 Mammogram: 12/21/2020 Bone Density: 10/19/2020 Recommended yearly ophthalmology/optometry visit for glaucoma screening and checkup Recommended yearly dental visit for hygiene and checkup  Vaccinations: Influenza vaccine: due in Fall 2022  Pneumococcal vaccine: completed series Tdap vaccine: due upon injury  Shingles vaccine: will consider     Advanced directives: none   Conditions/risks identified: none   Next appointment: CPE 08/03/2021  0900  [name]    Preventive Care 76 Years and Older, Female /Preventive care refers to lifestyle choices and visits with your health care provider that can promote health and wellness. What does preventive care include? A yearly physical exam. This is also called an annual well check. Dental exams once or twice a year. Routine eye exams. Ask your health care provider how often you should have your eyes checked. Personal lifestyle choices, including: Daily care of your teeth and gums. Regular physical activity. Eating a healthy diet. Avoiding tobacco and drug use. Limiting alcohol use. Practicing safe sex. Taking low-dose aspirin every day. Taking vitamin and mineral supplements as recommended by your health care provider. What happens during an annual well check? The services and screenings done by your health care provider during your annual well check will depend on your age, overall health, lifestyle risk factors, and family history of disease. Counseling  Your health care provider may ask you questions about your: Alcohol use. Tobacco use. Drug use. Emotional well-being. Home and relationship well-being. Sexual  activity. Eating habits. History of falls. Memory and ability to understand (cognition). Work and work Statistician. Reproductive health. Screening  You may have the following tests or measurements: Height, weight, and BMI. Blood pressure. Lipid and cholesterol levels. These may be checked every 5 years, or more frequently if you are over 105 years old. Skin check. Lung cancer screening. You may have this screening every year starting at age 108 if you have a 30-pack-year history of smoking and currently smoke or have quit within the past 15 years. Fecal occult blood test (FOBT) of the stool. You may have this test every year starting at age 75. Flexible sigmoidoscopy or colonoscopy. You may have a sigmoidoscopy every 5 years or a colonoscopy every 10 years starting at age 84. Hepatitis C blood test. Hepatitis B blood test. Sexually transmitted disease (STD) testing. Diabetes screening. This is done by checking your blood sugar (glucose) after you have not eaten for a while (fasting). You may have this done every 1-3 years. Bone density scan. This is done to screen for osteoporosis. You may have this done starting at age 18. Mammogram. This may be done every 1-2 years. Talk to your health care provider about how often you should have regular mammograms. Talk with your health care provider about your test results, treatment options, and if necessary, the need for more tests. Vaccines  Your health care provider may recommend certain vaccines, such as: Influenza vaccine. This is recommended every year. Tetanus, diphtheria, and acellular pertussis (Tdap, Td) vaccine. You may need a Td booster every 10 years. Zoster vaccine. You may need this after age 20. Pneumococcal 13-valent conjugate (PCV13) vaccine. One dose is recommended after age 87. Pneumococcal polysaccharide (PPSV23) vaccine. One dose is recommended after age 63. Talk  to your health care provider about which screenings and vaccines  you need and how often you need them. This information is not intended to replace advice given to you by your health care provider. Make sure you discuss any questions you have with your health care provider. Document Released: 11/24/2015 Document Revised: 07/17/2016 Document Reviewed: 08/29/2015 Elsevier Interactive Patient Education  2017  Prevention in the Home Falls can cause injuries. They can happen to people of all ages. There are many things you can do to make your home safe and to help prevent falls. What can I do on the outside of my home? Regularly fix the edges of walkways and driveways and fix any cracks. Remove anything that might make you trip as you walk through a door, such as a raised step or threshold. Trim any bushes or trees on the path to your home. Use bright outdoor lighting. Clear any walking paths of anything that might make someone trip, such as rocks or tools. Regularly check to see if handrails are loose or broken. Make sure that both sides of any steps have handrails. Any raised decks and porches should have guardrails on the edges. Have any leaves, snow, or ice cleared regularly. Use sand or salt on walking paths during winter. Clean up any spills in your garage right away. This includes oil or grease spills. What can I do in the bathroom? Use night lights. Install grab bars by the toilet and in the tub and shower. Do not use towel bars as grab bars. Use non-skid mats or decals in the tub or shower. If you need to sit down in the shower, use a plastic, non-slip stool. Keep the floor dry. Clean up any water that spills on the floor as soon as it happens. Remove soap buildup in the tub or shower regularly. Attach bath mats securely with double-sided non-slip rug tape. Do not have throw rugs and other things on the floor that can make you trip. What can I do in the bedroom? Use night lights. Make sure that you have a light by your bed that  is easy to reach. Do not use any sheets or blankets that are too big for your bed. They should not hang down onto the floor. Have a firm chair that has side arms. You can use this for support while you get dressed. Do not have throw rugs and other things on the floor that can make you trip. What can I do in the kitchen? Clean up any spills right away. Avoid walking on wet floors. Keep items that you use a lot in easy-to-reach places. If you need to reach something above you, use a strong step stool that has a grab bar. Keep electrical cords out of the way. Do not use floor polish or wax that makes floors slippery. If you must use wax, use non-skid floor wax. Do not have throw rugs and other things on the floor that can make you trip. What can I do with my stairs? Do not leave any items on the stairs. Make sure that there are handrails on both sides of the stairs and use them. Fix handrails that are broken or loose. Make sure that handrails are as long as the stairways. Check any carpeting to make sure that it is firmly attached to the stairs. Fix any carpet that is loose or worn. Avoid having throw rugs at the top or bottom of the stairs. If you do have throw rugs,  attach them to the floor with carpet tape. Make sure that you have a light switch at the top of the stairs and the bottom of the stairs. If you do not have them, ask someone to add them for you. What else can I do to help prevent falls? Wear shoes that: Do not have high heels. Have rubber bottoms. Are comfortable and fit you well. Are closed at the toe. Do not wear sandals. If you use a stepladder: Make sure that it is fully opened. Do not climb a closed stepladder. Make sure that both sides of the stepladder are locked into place. Ask someone to hold it for you, if possible. Clearly mark and make sure that you can see: Any grab bars or handrails. First and last steps. Where the edge of each step is. Use tools that help you  move around (mobility aids) if they are needed. These include: Canes. Walkers. Scooters. Crutches. Turn on the lights when you go into a dark area. Replace any light bulbs as soon as they burn out. Set up your furniture so you have a clear path. Avoid moving your furniture around. If any of your floors are uneven, fix them. If there are any pets around you, be aware of where they are. Review your medicines with your doctor. Some medicines can make you feel dizzy. This can increase your chance of falling. Ask your doctor what other things that you can do to help prevent falls. This information is not intended to replace advice given to you by your health care provider. Make sure you discuss any questions you have with your health care provider. Document Released: 08/24/2009 Document Revised: 04/04/2016 Document Reviewed: 12/02/2014 Elsevier Interactive Patient Education  2017 [employer].

## 2021-08-02 ENCOUNTER — Other Ambulatory Visit: Payer: Self-pay

## 2021-08-03 ENCOUNTER — Encounter: Payer: Medicare Other | Admitting: Adult Health

## 2021-08-07 ENCOUNTER — Other Ambulatory Visit: Payer: Medicare Other

## 2021-08-10 ENCOUNTER — Ambulatory Visit: Payer: Medicare Other | Admitting: Endocrinology

## 2021-08-28 ENCOUNTER — Ambulatory Visit (HOSPITAL_COMMUNITY)
Admission: EM | Admit: 2021-08-28 | Discharge: 2021-08-28 | Disposition: A | Discharge status: Home / Self Care | Payer: Medicare Other | Attending: Physician Assistant | Admitting: Physician Assistant

## 2021-08-28 ENCOUNTER — Other Ambulatory Visit: Payer: Self-pay

## 2021-08-28 ENCOUNTER — Encounter (HOSPITAL_COMMUNITY): Payer: Self-pay | Admitting: Emergency Medicine

## 2021-08-28 ENCOUNTER — Ambulatory Visit (INDEPENDENT_AMBULATORY_CARE_PROVIDER_SITE_OTHER): Payer: Medicare Other

## 2021-08-28 DIAGNOSIS — I10 Essential (primary) hypertension: Secondary | ICD-10-CM | POA: Diagnosis not present

## 2021-08-28 DIAGNOSIS — M79672 Pain in left foot: Secondary | ICD-10-CM | POA: Diagnosis not present

## 2021-08-28 NOTE — ED Provider Notes (Signed)
[place]    CSN: [id number] Arrival date & time: 08/28/21  1648      History   Chief Complaint Chief Complaint  Patient presents with   Foot Pain    Left     HPI Pam Obrien is a 75 y.o. female.   Patient presents today with a several day history of worsening left foot pain.  She denies any known injury or increase in activity prior to symptom onset.  Reports pain is rated 10 on a 0-10 pain scale, localized to plantar surface of left foot, described as sharp, worse with palpation or attempted ambulation, no alleviating factors identified.  She has tried Tylenol without improvement of symptoms.  She denies any numbness or paresthesias in foot.  She denies history of plantar fasciitis, gout, arthritis in her foot.  She denies any recent medication changes prior to symptom onset.  She denies any recent changes in footwear.  She is having difficulty with daily activities as result of symptoms.  Blood pressure is elevated today.  She denies any chest pain, shortness of breath, headache, dizziness, vision changes.  She does have a history of hypertension but did not take her antihypertensive medications today as she forgot when dealing with foot pain.  She does have the ability to monitor her blood pressure at home.   Past Medical History:  Diagnosis Date   Allergy    Anemia    as a young adult   Arthritis    Diabetes mellitus without complication ([place])    Hyperlipidemia    was normal range 05/2017 with PCP   Hypertension     Patient Active Problem List   Diagnosis Date Noted   Hyperlipidemia 07/21/2014   Type II diabetes mellitus, uncontrolled 01/04/2013   ESOPHAGEAL REFLUX 06/18/2010   HYPOKALEMIA, MILD 06/02/2009   Disorder of bone and cartilage 12/11/2007   COLONIC POLYPS, HX OF 12/11/2007   Essential hypertension 06/23/2007   ALLERGIC RHINITIS 06/23/2007   OSTEOARTHRITIS 06/23/2007    Past Surgical History:  Procedure Laterality Date   ABDOMINAL  HYSTERECTOMY  1994   COLONOSCOPY  2008   FLEXIBLE SIGMOIDOSCOPY  2003   POLYPECTOMY      OB History   No obstetric history on file.      Home Medications    Prior to Admission medications   Medication Sig Start Date End Date Taking? Authorizing Provider  ACCU-CHEK GUIDE test strip USE ACCU CHEK AVIVA TEST STRIPS AS INSTRUCTED TO CHECK BLOOD SUGAR ONCE DAILY. 06/13/21   Shamleffer, Melanie Crazier, MD  Accu-Chek Softclix Lancets lancets USE AS DIRECTED EVERY DAY 07/31/20   Elayne Snare, MD  acetaminophen (TYLENOL) 500 MG tablet Take 1 tablet (500 mg total) by mouth every 6 (six) hours as needed. 07/30/18   Zigmund Gottron, NP  atenolol-chlorthalidone (TENORETIC) 50-25 MG tablet TAKE 1/2 TABLET BY MOUTH EVERY DAY 02/14/21   Nafziger, [name], NP  Blood Glucose Monitoring Suppl (ACCU-CHEK AVIVA PLUS) w/Device KIT Use Accu Chek Aviva Plus to check blood sugar once daily. 12/07/19   Elayne Snare, MD  calcium citrate-vitamin D (CITRACAL+D) 315-200 MG-UNIT per tablet Take 1 tablet by mouth daily.    [provider]  cholecalciferol (VITAMIN D) 1000 UNITS tablet Take 2,000 Units by mouth daily.    [provider]  clotrimazole-betamethasone (LOTRISONE) cream Apply 1 application topically 2 (two) times daily.    [provider]  FeFum-FePo-FA-B Cmp-C-Zn-Mn-Cu (SE-TAN PLUS) 162-115.2-1 MG CAPS TAKE 1 CAPSULE BY MOUTH EVERY DAY  11/29/20   Nafziger, [name], NP  glimepiride (AMARYL) 4 MG tablet TAKE 1 TABLET BY MOUTH DAILY AT SUPPER. 04/09/21   Elayne Snare, MD  hydrOXYzine (ATARAX/VISTARIL) 25 MG tablet Take 25 mg by mouth 3 (three) times daily as needed. Take 1/2 tablet bid for itching Patient not taking: No sig reported    [provider]  Lancets Misc. (ACCU-CHEK FASTCLIX LANCET) KIT by Does not apply route. USE LANCET TO CHECK BLOOD SUGAR ONCE DAILY.    [provider]  loratadine (CLARITIN) 10 MG tablet Take 1 tablet (10 mg total) by mouth 2 (two) times daily as  needed for allergies. 03/14/14   Ricard Dillon, MD  metFORMIN (GLUCOPHAGE-XR) 500 MG 24 hr tablet TAKE 2 TABLETS BY MOUTH EVERY DAY WITH SUPPER 11/28/20   Elayne Snare, MD  omeprazole (PRILOSEC) 20 MG capsule Take 20 mg by mouth daily.    [provider]  simvastatin (ZOCOR) 20 MG tablet TAKE 1 TABLET BY MOUTH EVERY DAY 03/02/21   Renato Shin, MD  spironolactone (ALDACTONE) 50 MG tablet TAKE 1 TABLET BY MOUTH EVERY DAY 04/13/21   Elayne Snare, MD  triamcinolone (KENALOG) 0.1 % Apply 1 application topically 2 (two) times daily. 01/27/21   Wieters, Hallie C, PA-C  Triamcinolone Acetonide (TRIAMCINOLONE 0.1 % CREAM : EUCERIN) CREA Apply 1 application topically 2 (two) times daily. 01/27/21   Wieters, Elesa Hacker, PA-C    Family History Family History  Problem Relation Age of Onset   Hypertension Sister    Stroke Other    Diabetes Father    Hypertension Father    Stroke Father    Diabetes Sister    Diabetes Brother    Hypertension Brother    Stroke Brother    Cancer Mother    Esophageal cancer Neg Hx    Rectal cancer Neg Hx    Stomach cancer Neg Hx    Colon cancer Neg Hx     Social History Social History   Tobacco Use   Smoking status: Never   Smokeless tobacco: Never  Vaping Use   Vaping Use: Never used  Substance Use Topics   Alcohol use: No   Drug use: No     Allergies   Aspirin, Beef-derived products, Celecoxib, Milk-related compounds, Mold extract [trichophyton], Nsaids, Peanuts [peanut oil], Shellfish allergy, Sulfamethoxazole, and Vioxx [rofecoxib]   Review of Systems Review of Systems  Constitutional:  Positive for activity change. Negative for appetite change, fatigue and fever.  Eyes:  Negative for photophobia and visual disturbance.  Respiratory:  Negative for cough and shortness of breath.   Cardiovascular:  Negative for chest pain, palpitations and leg swelling.  Gastrointestinal:  Negative for abdominal pain, diarrhea, nausea and vomiting.   Musculoskeletal:  Positive for arthralgias and gait problem. Negative for joint swelling and myalgias.  Neurological:  Negative for dizziness, weakness, light-headedness, numbness and headaches.    Physical Exam Triage Vital Signs ED Triage Vitals  Enc Vitals Group     BP 08/28/21 1735 (!) 176/81     Pulse Rate 08/28/21 1735 79     Resp 08/28/21 1735 17     Temp 08/28/21 1735 98.3 F (36.8 C)     Temp src --      SpO2 08/28/21 1735 95 %     Weight --      Height --      Head Circumference --      Peak Flow --      Pain Score 08/28/21 1734  0     Pain Loc --      Pain Edu? --      Excl. in [place]? --    No data found.  Updated Vital Signs BP (!) 176/81   Pulse 79   Temp 98.3 F (36.8 C)   Resp 17   SpO2 95%   Visual Acuity Right Eye Distance:   Left Eye Distance:   Bilateral Distance:    Right Eye Near:   Left Eye Near:    Bilateral Near:     Physical Exam Vitals reviewed.  Constitutional:      General: She is awake. She is not in acute distress.    Appearance: Normal appearance. She is well-developed. She is not ill-appearing.     Comments: Very pleasant female appears stated age no acute distress sitting comfortably in exam room  HENT:     Head: Normocephalic and atraumatic.  Cardiovascular:     Rate and Rhythm: Normal rate and regular rhythm.     Pulses:          Posterior tibial pulses are 2+ on the right side and 2+ on the left side.     Heart sounds: Normal heart sounds, S1 normal and S2 normal. No murmur heard.    Comments: Capillary refill within 2 seconds left toes Pulmonary:     Effort: Pulmonary effort is normal.     Breath sounds: Normal breath sounds. No wheezing, rhonchi or rales.     Comments: Clear to auscultation bilaterally Musculoskeletal:     Right lower leg: No edema.     Left lower leg: No edema.     Left foot: Normal range of motion and normal capillary refill. Tenderness and bony tenderness present. No swelling, deformity or bunion.      Comments: Left foot: Foot neurovascularly intact.  No deformity noted.  Tenderness palpation over fourth and fifth metatarsal.  Feet:     Left foot:     Protective Sensation: 10 sites tested.  10 sites sensed.     Skin integrity: No ulcer, blister or skin breakdown.     Toenail Condition: Left toenails are normal.  Psychiatric:        Behavior: Behavior is cooperative.     UC Treatments / Results  Labs (all labs ordered are listed, but only abnormal results are displayed) Labs Reviewed - No data to display  EKG   Radiology DG Foot Complete Left  Result Date: 08/28/2021 CLINICAL DATA:  Left foot pain, no injury EXAM: LEFT FOOT - COMPLETE 3+ VIEW COMPARISON:  None. FINDINGS: Plantar calcaneal spur. No acute bony abnormality. Specifically, no fracture, subluxation, or dislocation. IMPRESSION: No acute bony abnormality. Electronically Signed   By: Rolm Baptise M.D.   On: 08/28/2021 18:34    Procedures Procedures (including critical care time)  Medications Ordered in UC Medications - No data to display  Initial Impression / Assessment and Plan / UC Course  I have reviewed the triage vital signs and the nursing notes.  Pertinent labs & imaging results that were available during my care of the patient were reviewed by me and considered in my medical decision making (see chart for details).     X-ray obtained given severity of symptoms that showed no acute osseous abnormality.  Discussed with patient that symptoms could be related to gout versus arthritis versus [place] fasciitis.  She has significant improvement of symptoms with icing was encouraged to continue with this.  Discussed potential utility of low-dose steroids  since patient is unable to take NSAIDs but due to concern for hyperglycemia she declined this today.  Offered her very low-dose hydrocodone but she declined this also due to concern for side effects after discussing pros and cons of this medication.  She was  placed in a postop shoe to help manage symptoms.  She can use Tylenol for breakthrough pain.  She was given contact information for podiatrist and if symptoms persist recommended follow-up with the specialist.  Discussed alarm symptoms that warrant emergent evaluation.  Strict return precautions given to which she expressed understanding.  Blood pressure is elevated today.  Patient denies any chest pain, shortness of breath, leg swelling, vision changes.  She has not taken her blood pressure medication as she forgot she was instructed to take this from soon as she gets home.  Recommend she monitor her blood pressure at home and keep log for evaluation of follow-up appointment.  If persistently above 140/90 she needs to be reevaluated.  Recommended she follow-up with her primary care provider within a week to ensure normalization of blood pressure.  Discussed that if she has any chest pain, shortness of breath, vision changes, headache, dizziness in the setting of high blood pressure she needs to go to the emergency room to which she expressed understanding.  Final Clinical Impressions(s) / UC Diagnoses   Final diagnoses:  Left foot pain  Elevated blood pressure reading in office with diagnosis of hypertension     Discharge Instructions      Continue using ice and elevation for symptom relief.  Use postop shoe for comfort.  Take Tylenol for pain.  If your symptoms not improving quickly follow-up with podiatry as we discussed.     ED Prescriptions   None    I have reviewed the PDMP during this encounter.   Terrilee Croak, PA-C 08/28/21 1907

## 2021-08-28 NOTE — ED Triage Notes (Signed)
Pt is present today with left foot pain that started Sunday.Pt describes the pain as being sharp. Pt denies any injury

## 2021-08-28 NOTE — Discharge Instructions (Addendum)
Continue using ice and elevation for symptom relief.  Use postop shoe for comfort.  Take Tylenol for pain.  If your symptoms not improving quickly follow-up with podiatry as we discussed.

## 2021-09-05 ENCOUNTER — Encounter: Payer: Self-pay | Admitting: Adult Health

## 2021-09-05 ENCOUNTER — Ambulatory Visit (INDEPENDENT_AMBULATORY_CARE_PROVIDER_SITE_OTHER): Payer: Medicare Other | Admitting: Adult Health

## 2021-09-05 ENCOUNTER — Other Ambulatory Visit: Payer: Self-pay

## 2021-09-05 ENCOUNTER — Other Ambulatory Visit (INDEPENDENT_AMBULATORY_CARE_PROVIDER_SITE_OTHER): Payer: Medicare Other

## 2021-09-05 VITALS — BP 130/86 | HR 104 | Temp 98.6°F | Ht 61.5 in | Wt 145.0 lb

## 2021-09-05 DIAGNOSIS — I10 Essential (primary) hypertension: Secondary | ICD-10-CM

## 2021-09-05 DIAGNOSIS — E7800 Pure hypercholesterolemia, unspecified: Secondary | ICD-10-CM | POA: Diagnosis not present

## 2021-09-05 DIAGNOSIS — E1165 Type 2 diabetes mellitus with hyperglycemia: Secondary | ICD-10-CM | POA: Diagnosis not present

## 2021-09-05 DIAGNOSIS — E118 Type 2 diabetes mellitus with unspecified complications: Secondary | ICD-10-CM | POA: Diagnosis not present

## 2021-09-05 DIAGNOSIS — E038 Other specified hypothyroidism: Secondary | ICD-10-CM | POA: Diagnosis not present

## 2021-09-05 DIAGNOSIS — E063 Autoimmune thyroiditis: Secondary | ICD-10-CM | POA: Diagnosis not present

## 2021-09-05 DIAGNOSIS — E785 Hyperlipidemia, unspecified: Secondary | ICD-10-CM

## 2021-09-05 LAB — COMPREHENSIVE METABOLIC PANEL
ALT: 10 U/L (ref 0–35)
AST: 17 U/L (ref 0–37)
Albumin: 4.6 g/dL (ref 3.5–5.2)
Alkaline Phosphatase: 70 U/L (ref 39–117)
BUN: 21 mg/dL (ref 6–23)
CO2: 25 mEq/L (ref 19–32)
Calcium: 9 mg/dL (ref 8.4–10.5)
Chloride: 103 mEq/L (ref 96–112)
Creatinine, Ser: 1.06 mg/dL (ref 0.40–1.20)
GFR: 50.93 mL/min — ABNORMAL LOW (ref >60.00)
Glucose, Bld: 153 mg/dL — ABNORMAL HIGH (ref 70–99)
Potassium: 4.5 mEq/L (ref 3.5–5.1)
Sodium: 138 mEq/L (ref 135–145)
Total Bilirubin: 0.6 mg/dL (ref 0.2–1.2)
Total Protein: 7.9 g/dL (ref 6.0–8.3)

## 2021-09-05 LAB — TSH: TSH: 2.56 u[IU]/mL (ref 0.35–5.50)

## 2021-09-05 LAB — T4, FREE: Free T4: 0.84 ng/dL (ref 0.60–1.60)

## 2021-09-05 LAB — LIPID PANEL
Cholesterol: 179 mg/dL (ref 0–200)
HDL: 54.6 mg/dL (ref >39.00)
LDL Cholesterol: 113 mg/dL — ABNORMAL HIGH (ref 0–99)
NonHDL: 124.38
Total CHOL/HDL Ratio: 3
Triglycerides: 55 mg/dL (ref 0.0–149.0)
VLDL: 11 mg/dL (ref 0.0–40.0)

## 2021-09-05 LAB — HEMOGLOBIN A1C: Hgb A1c MFr Bld: 7.4 % — ABNORMAL HIGH (ref 4.6–6.5)

## 2021-09-05 NOTE — Progress Notes (Signed)
Subjective:    Patient ID: Pam Obrien, female    DOB: Mar 16, 1946, 75 y.o.   MRN: [id number]  HPI Patient presents for yearly preventative medicine examination. She is a pleasant 75 year old female who  has a past medical history of Allergy, Anemia, Arthritis, Diabetes mellitus without complication ([place]), Hyperlipidemia, and Hypertension.  DM type II-is followed by endocrinology.  Managed with metformin 500 mg ER twice daily and Amaryl 4 mg daily.  She does check her blood sugars at home with readings between 80 and 150 Lab Results  Component Value Date   HGBA1C 6.9 (A) 04/06/2021   Hypertension-controlled with atenolol/hydrochlorothiazide 25-12.5 mg and spirloactone 50 mg daily. .  She denies dizziness, lightheadedness, chest pain, or syncopal episodes BP Readings from Last 3 Encounters:  09/05/21 130/86  08/28/21 (!) 176/81  07/12/21 132/78   Hyperlipidemia -prescribed simvastatin 20 mg by endocrinology.  He denies myalgia or fatigue Lab Results  Component Value Date   CHOL 170 01/01/2021   HDL 47.70 01/01/2021   LDLCALC 100 (H) 01/01/2021   LDLDIRECT 98.0 12/02/2019   TRIG 113.0 01/01/2021   CHOLHDL 4 01/01/2021   GERD - Controlled with Prilosec 20 mg   Left foot pain - was seen in the ER last week. Xrays did not show any cause. She is wearing a surgical shoe which helps but plans on following up with podiatry    All immunizations and health maintenance protocols were reviewed with the patient and needed orders were placed.  Appropriate screening laboratory values were ordered for the patient including screening of hyperlipidemia, renal function and hepatic function.  Medication reconciliation,  past medical history, social history, problem list and allergies were reviewed in detail with the patient  Goals were established with regard to weight loss, exercise, and  diet in compliance with medications  Wt Readings from Last 3 Encounters:  09/05/21 145 lb (65.8 kg)   07/12/21 151 lb (68.5 kg)  04/06/21 157 lb (71.2 kg)     Review of Systems  Constitutional: Negative.   HENT: Negative.    Eyes: Negative.   Respiratory: Negative.    Cardiovascular: Negative.   Gastrointestinal: Negative.   Endocrine: Negative.   Genitourinary: Negative.   Musculoskeletal: Negative.   Skin: Negative.   Allergic/Immunologic: Negative.   Neurological: Negative.   Hematological: Negative.   Psychiatric/Behavioral: Negative.    Past Medical History:  Diagnosis Date   Allergy    Anemia    as a young adult   Arthritis    Diabetes mellitus without complication ([place])    Hyperlipidemia    was normal range 05/2017 with PCP   Hypertension     Social History   Socioeconomic History   Marital status: Married    Spouse name: Not on file   Number of children: Not on file   Years of education: Not on file   Highest education level: Not on file  Occupational History   Not on file  Tobacco Use   Smoking status: Never   Smokeless tobacco: Never  Vaping Use   Vaping Use: Never used  Substance and Sexual Activity   Alcohol use: No   Drug use: No   Sexual activity: Not on file  Other Topics Concern   Not on file  Social History Narrative   Retired from [employer] work    Separated for 9 years.    Five children, two live locally, three are in [place]   One of her daughters live with  her   Has a dog.    She likes to dance and walk   Social Determinants of Health   Financial Resource Strain: Low Risk    Difficulty of Paying Living Expenses: Not hard at all  Food Insecurity: No Food Insecurity   Worried About Charity fundraiser in the Last Year: Never true   Arboriculturist in the Last Year: Never true  Transportation Needs: No Transportation Needs   Lack of Transportation (Medical): No   Lack of Transportation (Non-Medical): No  Physical Activity: Sufficiently Active   Days of Exercise per Week: 5 days   Minutes of Exercise per Session: 30 min   Stress: No Stress Concern Present   Feeling of Stress : Not at all  Social Connections: Socially Isolated   Frequency of Communication with Friends and Family: Three times a week   Frequency of Social Gatherings with Friends and Family: Three times a week   Attends Religious Services: Never   Active Member of Clubs or Organizations: No   Attends Archivist Meetings: Never   Marital Status: Divorced  Human resources officer Violence: Not At Risk   Fear of Current or Ex-Partner: No   Emotionally Abused: No   Physically Abused: No   Sexually Abused: No    Past Surgical History:  Procedure Laterality Date   ABDOMINAL HYSTERECTOMY  1994   COLONOSCOPY  2008   FLEXIBLE SIGMOIDOSCOPY  2003   POLYPECTOMY      Family History  Problem Relation Age of Onset   Hypertension Sister    Stroke Other    Diabetes Father    Hypertension Father    Stroke Father    Diabetes Sister    Diabetes Brother    Hypertension Brother    Stroke Brother    Cancer Mother    Esophageal cancer Neg Hx    Rectal cancer Neg Hx    Stomach cancer Neg Hx    Colon cancer Neg Hx     Allergies  Allergen Reactions   Aspirin     REACTION: nervous   Beef-Derived Products     Sneezing, nasal drainage, throat scratchy   Celecoxib     REACTION: joints swell   Milk-Related Compounds     itching   Mold Extract [Trichophyton]     Pt does not know the reaction.  Positive allergy test per pt.   Nsaids     REACTION: sweling   Peanuts [Peanut Oil]     Throat feels scratchy   Shellfish Allergy     Nasal congestion, throat scratchy, eyes watery   Sulfamethoxazole     REACTION: urticaria (hives)   Vioxx [Rofecoxib] Swelling    Current Outpatient Medications on File Prior to Visit  Medication Sig Dispense Refill   ACCU-CHEK GUIDE test strip USE ACCU CHEK AVIVA TEST STRIPS AS INSTRUCTED TO CHECK BLOOD SUGAR ONCE DAILY. 100 strip 2   Accu-Chek Softclix Lancets lancets USE AS DIRECTED EVERY DAY 100 each 1    acetaminophen (TYLENOL) 500 MG tablet Take 1 tablet (500 mg total) by mouth every 6 (six) hours as needed. 30 tablet 0   atenolol-chlorthalidone (TENORETIC) 50-25 MG tablet TAKE 1/2 TABLET BY MOUTH EVERY DAY 45 tablet 2   Blood Glucose Monitoring Suppl (ACCU-CHEK AVIVA PLUS) w/Device KIT Use Accu Chek Aviva Plus to check blood sugar once daily. 1 kit 0   calcium citrate-vitamin D (CITRACAL+D) 315-200 MG-UNIT per tablet Take 1 tablet by mouth daily.  cholecalciferol (VITAMIN D) 1000 UNITS tablet Take 2,000 Units by mouth daily.     clotrimazole-betamethasone (LOTRISONE) cream Apply 1 application topically 2 (two) times daily.     FeFum-FePo-FA-B Cmp-C-Zn-Mn-Cu (SE-TAN PLUS) 162-115.2-1 MG CAPS TAKE 1 CAPSULE BY MOUTH EVERY DAY 90 capsule 3   glimepiride (AMARYL) 4 MG tablet TAKE 1 TABLET BY MOUTH DAILY AT SUPPER. 90 tablet 1   hydrOXYzine (ATARAX/VISTARIL) 25 MG tablet Take 25 mg by mouth 3 (three) times daily as needed. Take 1/2 tablet bid for itching     Lancets Misc. (ACCU-CHEK FASTCLIX LANCET) KIT by Does not apply route. USE LANCET TO CHECK BLOOD SUGAR ONCE DAILY.     loratadine (CLARITIN) 10 MG tablet Take 1 tablet (10 mg total) by mouth 2 (two) times daily as needed for allergies. 180 tablet 3   metFORMIN (GLUCOPHAGE-XR) 500 MG 24 hr tablet TAKE 2 TABLETS BY MOUTH EVERY DAY WITH SUPPER 180 tablet 1   omeprazole (PRILOSEC) 20 MG capsule Take 20 mg by mouth daily.     simvastatin (ZOCOR) 20 MG tablet TAKE 1 TABLET BY MOUTH EVERY DAY 90 tablet 1   spironolactone (ALDACTONE) 50 MG tablet TAKE 1 TABLET BY MOUTH EVERY DAY 90 tablet 0   triamcinolone (KENALOG) 0.1 % Apply 1 application topically 2 (two) times daily. 45 g 0   Triamcinolone Acetonide (TRIAMCINOLONE 0.1 % CREAM : EUCERIN) CREA Apply 1 application topically 2 (two) times daily. 1 each 0   No current facility-administered medications on file prior to visit.    BP 130/86   Pulse (!) 104   Temp 98.6 F (37 C) (Oral)   Ht 5' 1.5"  (1.562 m)   Wt 145 lb (65.8 kg)   SpO2 96%   BMI 26.95 kg/m       Objective:   Physical Exam Vitals and nursing note reviewed.  Constitutional:      General: She is not in acute distress.    Appearance: Normal appearance. She is well-developed. She is not ill-appearing.  HENT:     Head: Normocephalic and atraumatic.     Right Ear: Tympanic membrane, ear canal and external ear normal. There is no impacted cerumen.     Left Ear: Tympanic membrane, ear canal and external ear normal. There is no impacted cerumen.     Nose: Nose normal. No congestion or rhinorrhea.     Mouth/Throat:     Mouth: Mucous membranes are moist.     Pharynx: Oropharynx is clear. No oropharyngeal exudate or posterior oropharyngeal erythema.  Eyes:     General:        Right eye: No discharge.        Left eye: No discharge.     Extraocular Movements: Extraocular movements intact.     Conjunctiva/sclera: Conjunctivae normal.     Pupils: Pupils are equal, round, and reactive to light.  Neck:     Thyroid: No thyromegaly.     Vascular: No carotid bruit.     Trachea: No tracheal deviation.  Cardiovascular:     Rate and Rhythm: Normal rate and regular rhythm.     Pulses: Normal pulses.     Heart sounds: Normal heart sounds. No murmur heard.   No friction rub. No gallop.  Pulmonary:     Effort: Pulmonary effort is normal. No respiratory distress.     Breath sounds: Normal breath sounds. No stridor. No wheezing, rhonchi or rales.  Chest:     Chest wall: No tenderness.  Abdominal:  General: Abdomen is flat. Bowel sounds are normal. There is no distension.     Palpations: Abdomen is soft. There is no mass.     Tenderness: There is no abdominal tenderness. There is no right CVA tenderness, left CVA tenderness, guarding or rebound.     Hernia: No hernia is present.  Musculoskeletal:        General: No swelling, tenderness, deformity or signs of injury. Normal range of motion.     Cervical back: Normal range  of motion and neck supple.     Right lower leg: No edema.     Left lower leg: No edema.  Lymphadenopathy:     Cervical: No cervical adenopathy.  Skin:    General: Skin is warm and dry.     Coloration: Skin is not jaundiced or pale.     Findings: No bruising, erythema, lesion or rash.  Neurological:     General: No focal deficit present.     Mental Status: She is alert and oriented to person, place, and time.     Cranial Nerves: No cranial nerve deficit.     Sensory: No sensory deficit.     Motor: No weakness.     Coordination: Coordination normal.     Gait: Gait normal.     Deep Tendon Reflexes: Reflexes normal.  Psychiatric:        Mood and Affect: Mood normal.        Behavior: Behavior normal.        Thought Content: Thought content normal.        Judgment: Judgment normal.      Assessment & Plan:  1. Essential hypertension - had all of her blood work done earlier this morning at endocrinology  - BP controlled. No changed in medications   2. Hyperlipidemia, unspecified hyperlipidemia type - Consider increase in statin   3. Controlled type 2 diabetes mellitus with complication, without long-term current use of insulin ([place]) - Follow up with endocrinology as directed  [name], NP

## 2021-09-06 ENCOUNTER — Other Ambulatory Visit: Payer: Self-pay | Admitting: Endocrinology

## 2021-09-07 ENCOUNTER — Other Ambulatory Visit: Payer: Self-pay

## 2021-09-07 ENCOUNTER — Ambulatory Visit (INDEPENDENT_AMBULATORY_CARE_PROVIDER_SITE_OTHER): Payer: Medicare Other | Admitting: Endocrinology

## 2021-09-07 ENCOUNTER — Encounter: Payer: Self-pay | Admitting: Endocrinology

## 2021-09-07 VITALS — BP 128/82 | HR 86 | Ht 62.0 in | Wt 145.4 lb

## 2021-09-07 DIAGNOSIS — E1165 Type 2 diabetes mellitus with hyperglycemia: Secondary | ICD-10-CM

## 2021-09-07 DIAGNOSIS — G589 Mononeuropathy, unspecified: Secondary | ICD-10-CM

## 2021-09-07 DIAGNOSIS — I10 Essential (primary) hypertension: Secondary | ICD-10-CM | POA: Diagnosis not present

## 2021-09-07 NOTE — Patient Instructions (Signed)
Check blood sugars on waking up 2-3 days a week  Also check blood sugars about 2 hours after meals and do this after different meals by rotation  Recommended blood sugar levels on waking up are 90-130 and about 2 hours after meal is 130-160  Please bring your blood sugar monitor to each visit, thank you   

## 2021-09-07 NOTE — Progress Notes (Signed)
Patient ID: Pam Obrien, female   DOB: 1946-08-14, 75 y.o.   MRN: [id number]     Reason for Appointment: Followup for Type 2 Diabetes   History of Present Illness:          Diagnosis: Type 2 diabetes mellitus, date of diagnosis:  4/13      Past history:  She was initially started on metformin but he thinks that this made her sleepy and had some abdominal discomfort   She also has been tried on other oral hypoglycemic regimens including Jentadueto  since 2014  But she had similar side effects with this  And not clear how regularly she had been taking this until it was discontinued in 5/15  A1c appears to be persistently over 8% Since 2014  She was tried on Invokamet by her PCP but because of excessive urination with this and also some nausea and weakness it was stopped With glyburide alone her blood sugars are poorly controlled and she was referred here for further management She was given Januvia but she did not take this because of expense of the medication   She was referred to the nurse educator and started on basal insulin to improve her control on 06/07/14 This was changed to premixed insulin in 9/15 because of continued poor control and her not being able to afford NovoLog mix insulin or Invokana  She stopped taking insulin in 1/16 because of the cost and was taking only Amaryl.  Recent history:   Hypoglycemic drugs: Glimepiride 4 mg at supper, metformin ER 500 mg in a.m. and pm    Her A1c is 7.4, has been as low as 6.4  Current management, blood sugar patterns and problems identified: She was told to take only half glimepiride a couple of times but she forgets  However has not had any low sugars or symptoms of hypoglycemia  Not clear if she is having asymptomatic low sugars and she is not monitoring blood sugar much at all  She thinks her blood sugars may be higher because of stress and having to take care of her daughter's terminal illness  This may be causing  the higher fasting reading of 153 and higher A1c  She appears to have lost weight also  Recently not exercising  She usually refuses to consider brand-name medications  Glucose monitoring with Accu-Chek meter   Remembers only 1 reading of 127  Previously  PRE-MEAL Fasting Lunch Dinner Bedtime Overall  Glucose range:  69-162    79-213 69-213  Mean/median:  116     122    Side effects from medications have been: Metformin higher doses will cause abdominal discomfort  Self-care: The diet that the patient has been following is: tries to limit  portions      Meals:  usually 2-3 meals per day. Usually has variable intake in the morning, sometimes toast/fruit, usually has carbohydrates at dinner time, will have fruit for snacks        Dietician visit: Most recent: 6/17 .              CDE visit last in 7/15  Wt Readings from Last 3 Encounters:  09/07/21 145 lb 6.4 oz (66 kg)  09/05/21 145 lb (65.8 kg)  07/12/21 151 lb (68.5 kg)       Glycemic control:   Lab Results  Component Value Date   HGBA1C 7.4 (H) 09/05/2021   HGBA1C 6.9 (A) 04/06/2021   HGBA1C 6.8 (H) 01/01/2021  Lab Results  Component Value Date   MICROALBUR 1.4 09/06/2020   LDLCALC 113 (H) 09/05/2021   CREATININE 1.06 09/05/2021    Lab on 09/05/2021  Component Date Value Ref Range Status   Free T4 09/05/2021 0.84  0.60 - 1.60 ng/dL Final   Comment: Specimens from patients who are undergoing biotin therapy and /or ingesting biotin supplements may contain high levels of biotin.  The higher biotin concentration in these specimens interferes with this Free T4 assay.  Specimens that contain high levels  of biotin may cause false high results for this Free T4 assay.  Please interpret results in light of the total clinical presentation of the patient.     Cholesterol 09/05/2021 179  0 - 200 mg/dL Final   ATP III Classification       Desirable:  < 200 mg/dL               Borderline High:  200 - 239 mg/dL          High:   > = 240 mg/dL   Triglycerides 09/05/2021 55.0  0.0 - 149.0 mg/dL Final   Normal:  <150 mg/dLBorderline High:  150 - 199 mg/dL   HDL 09/05/2021 54.60  >39.00 mg/dL Final   VLDL 09/05/2021 11.0  0.0 - 40.0 mg/dL Final   LDL Cholesterol 09/05/2021 113 (A)  0 - 99 mg/dL Final   Total CHOL/HDL Ratio 09/05/2021 3   Final                  Men          Women1/2 Average Risk     3.4          3.3Average Risk          5.0          4.42X Average Risk          9.6          7.13X Average Risk          15.0          11.0                       NonHDL 09/05/2021 124.38   Final   NOTE:  Non-HDL goal should be 30 mg/dL higher than patient's LDL goal (i.e. LDL goal of < 70 mg/dL, would have non-HDL goal of < 100 mg/dL)   Sodium 09/05/2021 138  135 - 145 mEq/L Final   Potassium 09/05/2021 4.5  3.5 - 5.1 mEq/L Final   Chloride 09/05/2021 103  96 - 112 mEq/L Final   CO2 09/05/2021 25  19 - 32 mEq/L Final   Glucose, Bld 09/05/2021 153 (A)  70 - 99 mg/dL Final   BUN 09/05/2021 21  6 - 23 mg/dL Final   Creatinine, Ser 09/05/2021 1.06  0.40 - 1.20 mg/dL Final   Total Bilirubin 09/05/2021 0.6  0.2 - 1.2 mg/dL Final   Alkaline Phosphatase 09/05/2021 70  39 - 117 U/L Final   AST 09/05/2021 17  0 - 37 U/L Final   ALT 09/05/2021 10  0 - 35 U/L Final   Total Protein 09/05/2021 7.9  6.0 - 8.3 g/dL Final   Albumin 09/05/2021 4.6  3.5 - 5.2 g/dL Final   GFR 09/05/2021 50.93 (A)  >60.00 mL/min Final   Calculated using the CKD-EPI Creatinine Equation (2021)   Calcium 09/05/2021 9.0  8.4 - 10.5 mg/dL Final   Hgb A1c MFr  Bld 09/05/2021 7.4 (A)  4.6 - 6.5 % Final   Glycemic Control Guidelines for People with Diabetes:Non Diabetic:  <6%Goal of Therapy: <7%Additional Action Suggested:  >8%    TSH 09/05/2021 2.56  0.35 - 5.50 uIU/mL Final     Weight history:  Previous range upto 172   Wt Readings from Last 3 Encounters:  09/07/21 145 lb 6.4 oz (66 kg)  09/05/21 145 lb (65.8 kg)  07/12/21 151 lb (68.5 kg)    Allergies  as of 09/07/2021       Reactions   Aspirin    REACTION: nervous   Beef-derived Products    Sneezing, nasal drainage, throat scratchy   Celecoxib    REACTION: joints swell   Milk-related Compounds    itching   Mold Extract [trichophyton]    Pt does not know the reaction.  Positive allergy test per pt.   Nsaids    REACTION: sweling   Peanuts [peanut Oil]    Throat feels scratchy   Shellfish Allergy    Nasal congestion, throat scratchy, eyes watery   Sulfamethoxazole    REACTION: urticaria (hives)   Vioxx [rofecoxib] Swelling        Medication List        Accurate as of September 07, 2021  9:58 AM. If you have any questions, ask your nurse or doctor.          Accu-Chek Aviva Plus w/Device Kit Use Accu Chek Aviva Plus to check blood sugar once daily.   Accu-Chek [employer] Kit by Does not apply route. USE LANCET TO CHECK BLOOD SUGAR ONCE DAILY.   Accu-Chek Guide test strip Generic drug: glucose blood USE ACCU CHEK AVIVA TEST STRIPS AS INSTRUCTED TO CHECK BLOOD SUGAR ONCE DAILY.   Accu-Chek Softclix Lancets lancets USE AS DIRECTED EVERY DAY   acetaminophen 500 MG tablet Commonly known as: TYLENOL Take 1 tablet (500 mg total) by mouth every 6 (six) hours as needed.   atenolol-chlorthalidone 50-25 MG tablet Commonly known as: TENORETIC TAKE 1/2 TABLET BY MOUTH EVERY DAY   calcium citrate-vitamin D 315-200 MG-UNIT tablet Commonly known as: CITRACAL+D Take 1 tablet by mouth daily.   cholecalciferol 1000 units tablet Commonly known as: VITAMIN D Take 2,000 Units by mouth daily.   clotrimazole-betamethasone cream Commonly known as: LOTRISONE Apply 1 application topically 2 (two) times daily.   glimepiride 4 MG tablet Commonly known as: AMARYL TAKE 1 TABLET BY MOUTH DAILY AT SUPPER.   hydrOXYzine 25 MG tablet Commonly known as: ATARAX/VISTARIL Take 25 mg by mouth 3 (three) times daily as needed. Take 1/2 tablet bid for itching   loratadine 10 MG  tablet Commonly known as: CLARITIN Take 1 tablet (10 mg total) by mouth 2 (two) times daily as needed for allergies.   metFORMIN 500 MG 24 hr tablet Commonly known as: GLUCOPHAGE-XR TAKE 2 TABLETS BY MOUTH EVERY DAY WITH SUPPER   omeprazole 20 MG capsule Commonly known as: PRILOSEC Take 20 mg by mouth daily.   Se-Tan PLUS 162-115.2-1 MG Caps TAKE 1 CAPSULE BY MOUTH EVERY DAY   simvastatin 20 MG tablet Commonly known as: ZOCOR TAKE 1 TABLET BY MOUTH EVERY DAY   spironolactone 50 MG tablet Commonly known as: ALDACTONE TAKE 1 TABLET BY MOUTH EVERY DAY   triamcinolone 0.1 % cream : eucerin Crea Apply 1 application topically 2 (two) times daily.   triamcinolone cream 0.1 % Commonly known as: KENALOG Apply 1 application topically 2 (two) times daily.  Allergies:  Allergies  Allergen Reactions   Aspirin     REACTION: nervous   Beef-Derived Products     Sneezing, nasal drainage, throat scratchy   Celecoxib     REACTION: joints swell   Milk-Related Compounds     itching   Mold Extract [Trichophyton]     Pt does not know the reaction.  Positive allergy test per pt.   Nsaids     REACTION: sweling   Peanuts [Peanut Oil]     Throat feels scratchy   Shellfish Allergy     Nasal congestion, throat scratchy, eyes watery   Sulfamethoxazole     REACTION: urticaria (hives)   Vioxx [Rofecoxib] Swelling    Past Medical History:  Diagnosis Date   Allergy    Anemia    as a young adult   Arthritis    Diabetes mellitus without complication ([place])    Hyperlipidemia    was normal range 05/2017 with PCP   Hypertension     Past Surgical History:  Procedure Laterality Date   ABDOMINAL HYSTERECTOMY  1994   COLONOSCOPY  2008   FLEXIBLE SIGMOIDOSCOPY  2003   POLYPECTOMY      Family History  Problem Relation Age of Onset   Hypertension Sister    Stroke Other    Diabetes Father    Hypertension Father    Stroke Father    Diabetes Sister    Diabetes Brother     Hypertension Brother    Stroke Brother    Cancer Mother    Esophageal cancer Neg Hx    Rectal cancer Neg Hx    Stomach cancer Neg Hx    Colon cancer Neg Hx     Social History:  reports that she has never smoked. She has never used smokeless tobacco. She reports that she does not drink alcohol and does not use drugs.    Review of Systems    HYPERTENSION: She takes a half a tablet of atenolol/chlorthalidone along with half tablet of spironolactone 50 mg Does not check blood pressure at home  This is followed by her PCP  BP Readings from Last 3 Encounters:  09/07/21 128/82  09/05/21 130/86  08/28/21 (!) 176/81    HYPOKALEMIA: This is secondary to HCTZ controlled with Aldactone Taking a half of a 50 mg tablet daily  Lab Results  Component Value Date   K 4.5 09/05/2021   Renal function history  Lab Results  Component Value Date   CREATININE 1.06 09/05/2021   CREATININE 1.15 01/01/2021   CREATININE 1.21 (H) 09/06/2020      Lipids: She did not take pravastatin previously prescribed because of cost Started simvastatin in 7/19   If she takes this daily she will have pain in her arm at times with simvastatin either on the right or the left and is only taking it every other day  LDL has gone up slightly as of last labs  She does have family history of CVA but no known history of CAD    Lab Results  Component Value Date   CHOL 179 09/05/2021   CHOL 170 01/01/2021   CHOL 212 (H) 08/17/2020   Lab Results  Component Value Date   HDL 54.60 09/05/2021   HDL 47.70 01/01/2021   HDL 54 08/17/2020   Lab Results  Component Value Date   LDLCALC 113 (H) 09/05/2021   LDLCALC 100 (H) 01/01/2021   LDLCALC 138 (H) 08/17/2020   Lab Results  Component Value Date  TRIG 55.0 09/05/2021   TRIG 113.0 01/01/2021   TRIG 101 08/17/2020   Lab Results  Component Value Date   CHOLHDL 3 09/05/2021   CHOLHDL 4 01/01/2021   CHOLHDL 3.9 08/17/2020   Lab Results  Component  Value Date   LDLDIRECT 98.0 12/02/2019   LDLDIRECT 130.0 05/17/2016   LDLDIRECT 121.7 03/04/2012     Last foot exam was in 10/21  About 10 days ago she went to the urgent care center for sharp pains in her left foot without any swelling and this is better now without any specific intervention, no numbness or tingling  She has had routine thyroid tests and TSH has been periodically above normal   Lab Results  Component Value Date   TSH 2.56 09/05/2021   TSH 1.98 01/01/2021   TSH 4.77 (H) 10/20/2020   FREET4 0.84 09/05/2021   FREET4 0.78 01/01/2021   FREET4 1.0 10/20/2020      Physical Examination:  BP 128/82   Pulse 86   Ht '5\' 2"'  (1.575 m)   Wt 145 lb 6.4 oz (66 kg)   SpO2 98%   BMI 26.59 kg/m       ASSESSMENT:  Diabetes type 2 BMI under 30, non-insulin-dependent  See history of present illness for detailed discussion of  current management, blood sugar patterns and problems identified  A1c is 7.4 compared to 6.8   She is on Amaryl 4 mg and Metformin She has taken her medication regularly apparently but likely has had high sugars from stress, lack of exercise and possibly inconsistent meal planning over the last 3 months because of family issues  Hypercholesterolemia: Not well controlled because she can only tolerate simvastatin every other day and pravastatin was too expensive   History of hypokalemia: Controlled with Aldactone and will continue the 25 mg dose  HYPERTENSION: Blood pressure better and she needs to take her atenolol HCT regularly Also consider adding ARB drug  PLAN:   Start checking blood sugars regularly alternating fasting and after meals Call if consistently high or low, discussed blood sugar targets If starting to get low sugar may reduce Amaryl to half or even 3 mg She will start regular exercise indoors Balanced meals with some protein at each meal She can continue taking simvastatin every other day for now She will follow-up with  the podiatrist regarding foot pain    There are no Patient Instructions on file for this visit.      Elayne Snare 09/07/2021, 9:58 AM   Note: This office note was prepared with Dragon voice recognition system technology. Any transcriptional errors that result from this process are unintentional.

## 2021-12-06 ENCOUNTER — Other Ambulatory Visit: Payer: Medicare Other

## 2021-12-12 ENCOUNTER — Other Ambulatory Visit: Payer: Medicare Other

## 2021-12-13 ENCOUNTER — Ambulatory Visit: Payer: Medicare Other | Admitting: Endocrinology

## 2021-12-19 ENCOUNTER — Ambulatory Visit: Payer: Medicare Other | Admitting: Endocrinology

## 2021-12-29 ENCOUNTER — Other Ambulatory Visit: Payer: Self-pay | Admitting: Endocrinology

## 2022-01-10 ENCOUNTER — Ambulatory Visit: Payer: Medicare Other | Admitting: Endocrinology

## 2022-01-18 ENCOUNTER — Other Ambulatory Visit: Payer: Self-pay | Admitting: Adult Health

## 2022-02-07 ENCOUNTER — Ambulatory Visit (INDEPENDENT_AMBULATORY_CARE_PROVIDER_SITE_OTHER): Payer: Medicare Other | Admitting: Endocrinology

## 2022-02-07 VITALS — BP 124/82 | HR 73 | Ht 61.0 in | Wt 153.2 lb

## 2022-02-07 DIAGNOSIS — E1165 Type 2 diabetes mellitus with hyperglycemia: Secondary | ICD-10-CM | POA: Diagnosis not present

## 2022-02-07 DIAGNOSIS — E7800 Pure hypercholesterolemia, unspecified: Secondary | ICD-10-CM | POA: Diagnosis not present

## 2022-02-07 DIAGNOSIS — E038 Other specified hypothyroidism: Secondary | ICD-10-CM

## 2022-02-07 LAB — LIPID PANEL
Cholesterol: 223 mg/dL — ABNORMAL HIGH (ref 0–200)
HDL: 56.6 mg/dL (ref >39.00)
LDL Cholesterol: 148 mg/dL — ABNORMAL HIGH (ref 0–99)
NonHDL: 166.31
Total CHOL/HDL Ratio: 4
Triglycerides: 92 mg/dL (ref 0.0–149.0)
VLDL: 18.4 mg/dL (ref 0.0–40.0)

## 2022-02-07 LAB — POCT GLYCOSYLATED HEMOGLOBIN (HGB A1C): Hemoglobin A1C: 6.9 % — AB (ref 4.0–5.6)

## 2022-02-07 LAB — COMPREHENSIVE METABOLIC PANEL
ALT: 11 U/L (ref 0–35)
AST: 15 U/L (ref 0–37)
Albumin: 4.9 g/dL (ref 3.5–5.2)
Alkaline Phosphatase: 81 U/L (ref 39–117)
BUN: 32 mg/dL — ABNORMAL HIGH (ref 6–23)
CO2: 28 mEq/L (ref 19–32)
Calcium: 9.8 mg/dL (ref 8.4–10.5)
Chloride: 100 mEq/L (ref 96–112)
Creatinine, Ser: 1.34 mg/dL — ABNORMAL HIGH (ref 0.40–1.20)
GFR: 38.33 mL/min — ABNORMAL LOW (ref >60.00)
Glucose, Bld: 146 mg/dL — ABNORMAL HIGH (ref 70–99)
Potassium: 4.6 mEq/L (ref 3.5–5.1)
Sodium: 139 mEq/L (ref 135–145)
Total Bilirubin: 0.5 mg/dL (ref 0.2–1.2)
Total Protein: 7.8 g/dL (ref 6.0–8.3)

## 2022-02-07 LAB — POCT GLUCOSE (DEVICE FOR HOME USE): Glucose Fasting, POC: 168 mg/dL — AB (ref 70–99)

## 2022-02-07 LAB — TSH: TSH: 5.49 u[IU]/mL (ref 0.35–5.50)

## 2022-02-07 NOTE — Patient Instructions (Addendum)
Check blood sugars on waking up 3 days a week ? ?Also check blood sugars about 2 hours after meals and do this after different meals by rotation ? ?Recommended blood sugar levels on waking up are 90-130 and about 2 hours after meal is 130-160 ? ?Please bring your blood sugar monitor to each visit, thank you ? ?Try Simvastatin daily, with food ?

## 2022-02-07 NOTE — Progress Notes (Signed)
+       ? ? ?Patient ID: Pam Obrien, female   DOB: 07/17/1946, 76 y.o.   MRN: [id number] ? ?  ? ?Reason for Appointment: Followup for Type 2 Diabetes ? ? ?History of Present Illness:  ?        ?Diagnosis: Type 2 diabetes mellitus, date of diagnosis:  4/13     ? ?Past history:  She was initially started on metformin but he thinks that this made her sleepy and had some abdominal discomfort  ? She also has been tried on other oral hypoglycemic regimens including Jentadueto  since 2014  But she had similar side effects with this  And not clear how regularly she had been taking this until it was discontinued in 5/15 ? A1c appears to be persistently over 8% Since 2014 ? She was tried on Invokamet by her PCP but because of excessive urination with this and also some nausea and weakness it was stopped ?With glyburide alone her blood sugars are poorly controlled and she was referred here for further management ?She was given Januvia but she did not take this because of expense of the medication   ?She was referred to the nurse educator and started on basal insulin to improve her control on 06/07/14 ?This was changed to premixed insulin in 9/15 because of continued poor control and her not being able to afford NovoLog mix insulin or Invokana  ?She stopped taking insulin in 1/16 because of the cost and was taking only Amaryl. ? ?Recent history:  ? ?Hypoglycemic drugs: Glimepiride 4 mg at supper, metformin ER 500 mg in a.m. and pm  ? ? ?Her A1c is 6.9 compared to 7.4, has been as low as 6.4 ? ?Current management, blood sugar patterns and problems identified: ? ?Not been seen since 10/22  ?Blood sugars have been higher on the last visit because of stress of family illness  ?However in the last few weeks she is irregular with checking her blood sugars and did not bring her monitor ?She states that she just got a new battery but has not restarted monitoring  ?Unlikely that she is checking her blood sugars regularly since she  thinks they are running only about 125 compared to office reading of 168 fasting today  ?Previously may have lost weight from stress but has gone up 8 pounds since her last visit ?Does not think she is getting excessive snacks ?She thinks she is able to walk with her dog fairly regularly ?No hypoglycemic symptoms but not clear if she is able to recognize low sugars ?She usually refuses to consider brand-name medications ? ?Glucose monitoring with Accu-Chek meter  ? ?Remembers only 1 reading of 125 ? ?Side effects from medications have been: Metformin higher doses will cause abdominal discomfort ? ?Self-care: The diet that the patient has been following is: tries to limit  portions     ? Meals:  usually 2-3 meals per day. Usually has variable intake in the morning, sometimes toast/fruit, usually has carbohydrates at dinner time, will have fruit for snacks    ?  ?  ?Dietician visit: Most recent: 6/17 ?Marland Kitchen              ?CDE visit last in 7/15 ? ?Wt Readings from Last 3 Encounters:  ?02/07/22 153 lb 3.2 oz (69.5 kg)  ?09/07/21 145 lb 6.4 oz (66 kg)  ?09/05/21 145 lb (65.8 kg)  ? ?   ? Glycemic control:  ? ?Lab Results  ?Component  Value Date  ? HGBA1C 6.9 (A) 02/07/2022  ? HGBA1C 7.4 (H) 09/05/2021  ? HGBA1C 6.9 (A) 04/06/2021  ? ?Lab Results  ?Component Value Date  ? MICROALBUR 1.4 09/06/2020  ? LDLCALC 113 (H) 09/05/2021  ? CREATININE 1.06 09/05/2021  ? ? ?Office Visit on 02/07/2022  ?Component Date Value Ref Range Status  ? Glucose Fasting, POC 02/07/2022 168 (A)  70 - 99 mg/dL Final  ? Hemoglobin A1C 02/07/2022 6.9 (A)  4.0 - 5.6 % Final  ? ? ? ?Weight history:  Previous range upto 172  ? ?Wt Readings from Last 3 Encounters:  ?02/07/22 153 lb 3.2 oz (69.5 kg)  ?09/07/21 145 lb 6.4 oz (66 kg)  ?09/05/21 145 lb (65.8 kg)  ? ? ?Allergies as of 02/07/2022   ? ?   Reactions  ? Aspirin   ? REACTION: nervous  ? Beef-derived Products   ? Sneezing, nasal drainage, throat scratchy  ? Celecoxib   ? REACTION: joints swell  ?  Milk-related Compounds   ? itching  ? Mold Extract [trichophyton]   ? Pt does not know the reaction.  Positive allergy test per pt.  ? Nsaids   ? REACTION: sweling  ? Peanuts [peanut Oil]   ? Throat feels scratchy  ? Shellfish Allergy   ? Nasal congestion, throat scratchy, eyes watery  ? Sulfamethoxazole   ? REACTION: urticaria (hives)  ? Vioxx [rofecoxib] Swelling  ? ?  ? ?  ?Medication List  ?  ? ?  ? Accurate as of February 07, 2022 12:58 PM. If you have any questions, ask your nurse or doctor.  ?  ?  ? ?  ? ?Accu-Chek Aviva Plus w/Device Kit ?Use Accu Chek Aviva Plus to check blood sugar once daily. ?  ?Accu-Chek [employer] Kit ?by Does not apply route. USE LANCET TO CHECK BLOOD SUGAR ONCE DAILY. ?  ?Accu-Chek Guide test strip ?Generic drug: glucose blood ?USE ACCU CHEK AVIVA TEST STRIPS AS INSTRUCTED TO CHECK BLOOD SUGAR ONCE DAILY. ?  ?Accu-Chek Softclix Lancets lancets ?USE AS DIRECTED EVERY DAY ?  ?acetaminophen 500 MG tablet ?Commonly known as: TYLENOL ?Take 1 tablet (500 mg total) by mouth every 6 (six) hours as needed. ?  ?atenolol-chlorthalidone 50-25 MG tablet ?Commonly known as: TENORETIC ?TAKE 1/2 TABLET BY MOUTH EVERY DAY ?  ?calcium citrate-vitamin D 315-200 MG-UNIT tablet ?Commonly known as: CITRACAL+D ?Take 1 tablet by mouth daily. ?  ?cholecalciferol 1000 units tablet ?Commonly known as: VITAMIN D ?Take 2,000 Units by mouth daily. ?  ?clotrimazole-betamethasone cream ?Commonly known as: LOTRISONE ?Apply 1 application topically 2 (two) times daily. ?  ?glimepiride 4 MG tablet ?Commonly known as: AMARYL ?TAKE 1 TABLET BY MOUTH DAILY AT SUPPER. ?  ?hydrOXYzine 25 MG tablet ?Commonly known as: ATARAX ?Take 25 mg by mouth 3 (three) times daily as needed. Take 1/2 tablet bid for itching ?  ?loratadine 10 MG tablet ?Commonly known as: CLARITIN ?Take 1 tablet (10 mg total) by mouth 2 (two) times daily as needed for allergies. ?  ?metFORMIN 500 MG 24 hr tablet ?Commonly known as: GLUCOPHAGE-XR ?TAKE 2  TABLETS BY MOUTH EVERY DAY WITH SUPPER ?  ?omeprazole 20 MG capsule ?Commonly known as: PRILOSEC ?Take 20 mg by mouth daily. ?  ?Se-Tan PLUS 162-115.2-1 MG Caps ?TAKE 1 CAPSULE BY MOUTH EVERY DAY ?  ?simvastatin 20 MG tablet ?Commonly known as: ZOCOR ?TAKE 1 TABLET BY MOUTH EVERY DAY ?  ?spironolactone 50 MG tablet ?Commonly known as: ALDACTONE ?TAKE  1 TABLET BY MOUTH EVERY DAY ?  ?triamcinolone 0.1 % cream : eucerin Crea ?Apply 1 application topically 2 (two) times daily. ?  ?triamcinolone cream 0.1 % ?Commonly known as: KENALOG ?Apply 1 application topically 2 (two) times daily. ?  ? ?  ? ? ?Allergies:  ?Allergies  ?Allergen Reactions  ? Aspirin   ?  REACTION: nervous  ? Beef-Derived Products   ?  Sneezing, nasal drainage, throat scratchy  ? Celecoxib   ?  REACTION: joints swell  ? Milk-Related Compounds   ?  itching  ? Mold Extract [Trichophyton]   ?  Pt does not know the reaction.  Positive allergy test per pt.  ? Nsaids   ?  REACTION: sweling  ? Peanuts [Peanut Oil]   ?  Throat feels scratchy  ? Shellfish Allergy   ?  Nasal congestion, throat scratchy, eyes watery  ? Sulfamethoxazole   ?  REACTION: urticaria (hives)  ? Vioxx [Rofecoxib] Swelling  ? ? ?Past Medical History:  ?Diagnosis Date  ? Allergy   ? Anemia   ? as a young adult  ? Arthritis   ? Diabetes mellitus without complication ([place])   ? Hyperlipidemia   ? was normal range 05/2017 with PCP  ? Hypertension   ? ? ?Past Surgical History:  ?Procedure Laterality Date  ? ABDOMINAL HYSTERECTOMY  1994  ? COLONOSCOPY  2008  ? FLEXIBLE SIGMOIDOSCOPY  2003  ? POLYPECTOMY    ? ? ?Family History  ?Problem Relation Age of Onset  ? Hypertension Sister   ? Stroke Other   ? Diabetes Father   ? Hypertension Father   ? Stroke Father   ? Diabetes Sister   ? Diabetes Brother   ? Hypertension Brother   ? Stroke Brother   ? Cancer Mother   ? Esophageal cancer Neg Hx   ? Rectal cancer Neg Hx   ? Stomach cancer Neg Hx   ? Colon cancer Neg Hx   ? ? ?Social History:  reports that  she has never smoked. She has never used smokeless tobacco. She reports that she does not drink alcohol and does not use drugs. ? ?  ?Review of Systems  ? ? ?HYPERTENSION: She takes a half a tablet of ateno

## 2022-02-08 LAB — MICROALBUMIN / CREATININE URINE RATIO
Creatinine,U: 184.1 mg/dL
Microalb Creat Ratio: 0.6 mg/g (ref 0.0–30.0)
Microalb, Ur: 1.2 mg/dL (ref 0.0–1.9)

## 2022-02-11 NOTE — Progress Notes (Signed)
Cholesterol is higher than usual.  Need to know if she is taking her simvastatin, recommend that she take this at least 5 days a week.  Her thyroid is okay but her kidney test is slightly worse, she needs to set up follow-up with Pam Obrien ?

## 2022-02-20 ENCOUNTER — Other Ambulatory Visit: Payer: Self-pay | Admitting: Endocrinology

## 2022-02-20 ENCOUNTER — Other Ambulatory Visit: Payer: Self-pay | Admitting: Adult Health

## 2022-02-20 DIAGNOSIS — I10 Essential (primary) hypertension: Secondary | ICD-10-CM

## 2022-02-28 ENCOUNTER — Ambulatory Visit: Payer: Medicare Other | Admitting: Adult Health

## 2022-03-06 ENCOUNTER — Ambulatory Visit (INDEPENDENT_AMBULATORY_CARE_PROVIDER_SITE_OTHER): Payer: Medicare Other | Admitting: Adult Health

## 2022-03-06 VITALS — BP 138/86 | HR 77 | Temp 98.6°F | Ht 61.0 in | Wt 151.0 lb

## 2022-03-06 DIAGNOSIS — N289 Disorder of kidney and ureter, unspecified: Secondary | ICD-10-CM | POA: Diagnosis not present

## 2022-03-06 LAB — BASIC METABOLIC PANEL
BUN: 31 mg/dL — ABNORMAL HIGH (ref 6–23)
CO2: 28 mEq/L (ref 19–32)
Calcium: 9.8 mg/dL (ref 8.4–10.5)
Chloride: 100 mEq/L (ref 96–112)
Creatinine, Ser: 1.27 mg/dL — ABNORMAL HIGH (ref 0.40–1.20)
GFR: 40.85 mL/min — ABNORMAL LOW (ref >60.00)
Glucose, Bld: 151 mg/dL — ABNORMAL HIGH (ref 70–99)
Potassium: 5.1 mEq/L (ref 3.5–5.1)
Sodium: 137 mEq/L (ref 135–145)

## 2022-03-06 NOTE — Patient Instructions (Signed)
Health Maintenance Due  ?Topic Date Due  ? COVID-19 Vaccine (1) Never done  ? Zoster Vaccines- Shingrix (1 of 2) Never done  ? TETANUS/TDAP  11/11/2016  ? OPHTHALMOLOGY EXAM  06/21/2021  ? ? ? ? Row Labels 07/12/2021  ? 11:35 AM 07/12/2021  ? 11:31 AM 07/03/2021  ?  1:54 PM  ?Depression screen PHQ 2/9   Section Header. No data exists in this row.     ?Decreased Interest   0 0 0  ?Down, Depressed, Hopeless   0 0 0  ?PHQ - 2 Score   0 0 0  ? ? ?

## 2022-03-06 NOTE — Progress Notes (Signed)
? ?Subjective:  ? ? Patient ID: Pam Obrien, female    DOB: [birth year], 76 y.o.   MRN: [id number] ? ?HPI ?76 year old female who  has a past medical history of Allergy, Anemia, Arthritis, Diabetes mellitus without complication ([place]), Hyperlipidemia, and Hypertension. ? ?She was recently seen by endocrinology his labs showed a slightly decreased kidney function with a serum creatinine of 1.34 and a GFR of 38.  Baseline creatinine 1.1-1.2 and GFR 40-50.  She was advised to follow-up with his PCP for further evaluation ? ?She does not use NSAIDs. She does stay hydrated to best of her ability. Denies concentrated urine or noticing blood in urine.  ? ?Review of Systems ?See HPI  ? ?Past Medical History:  ?Diagnosis Date  ? Allergy   ? Anemia   ? as a young adult  ? Arthritis   ? Diabetes mellitus without complication ([place])   ? Hyperlipidemia   ? was normal range 05/2017 with PCP  ? Hypertension   ? ? ?Social History  ? ?Socioeconomic History  ? Marital status: Married  ?  Spouse name: Not on file  ? Number of children: Not on file  ? Years of education: Not on file  ? Highest education level: Not on file  ?Occupational History  ? Not on file  ?Tobacco Use  ? Smoking status: Never  ? Smokeless tobacco: Never  ?Vaping Use  ? Vaping Use: Never used  ?Substance and Sexual Activity  ? Alcohol use: No  ? Drug use: No  ? Sexual activity: Not on file  ?Other Topics Concern  ? Not on file  ?Social History Narrative  ? Retired from [employer] work   ? Separated for 9 years.   ? Five children, two live locally, three are in [place]  ? One of her daughters live with her  ? Has a dog.   ? She likes to dance and walk  ? ?Social Determinants of Health  ? ?Financial Resource Strain: Low Risk   ? Difficulty of Paying Living Expenses: Not hard at all  ?Food Insecurity: No Food Insecurity  ? Worried About Charity fundraiser in the Last Year: Never true  ? Ran Out of Food in the Last Year: Never true  ?Transportation Needs: No  Transportation Needs  ? Lack of Transportation (Medical): No  ? Lack of Transportation (Non-Medical): No  ?Physical Activity: Sufficiently Active  ? Days of Exercise per Week: 5 days  ? Minutes of Exercise per Session: 30 min  ?Stress: No Stress Concern Present  ? Feeling of Stress : Not at all  ?Social Connections: Socially Isolated  ? Frequency of Communication with Friends and Family: Three times a week  ? Frequency of Social Gatherings with Friends and Family: Three times a week  ? Attends Religious Services: Never  ? Active Member of Clubs or Organizations: No  ? Attends Archivist Meetings: Never  ? Marital Status: Divorced  ?Intimate Partner Violence: Not At Risk  ? Fear of Current or Ex-Partner: No  ? Emotionally Abused: No  ? Physically Abused: No  ? Sexually Abused: No  ? ? ?Past Surgical History:  ?Procedure Laterality Date  ? ABDOMINAL HYSTERECTOMY  1994  ? COLONOSCOPY  2008  ? FLEXIBLE SIGMOIDOSCOPY  2003  ? POLYPECTOMY    ? ? ?Family History  ?Problem Relation Age of Onset  ? Hypertension Sister   ? Stroke Other   ? Diabetes Father   ? Hypertension Father   ?  Stroke Father   ? Diabetes Sister   ? Diabetes Brother   ? Hypertension Brother   ? Stroke Brother   ? Cancer Mother   ? Esophageal cancer Neg Hx   ? Rectal cancer Neg Hx   ? Stomach cancer Neg Hx   ? Colon cancer Neg Hx   ? ? ?Allergies  ?Allergen Reactions  ? Aspirin   ?  REACTION: nervous  ? Beef-Derived Products   ?  Sneezing, nasal drainage, throat scratchy  ? Celecoxib   ?  REACTION: joints swell  ? Milk-Related Compounds   ?  itching  ? Mold Extract [Trichophyton]   ?  Pt does not know the reaction.  Positive allergy test per pt.  ? Nsaids   ?  REACTION: sweling  ? Peanuts [Peanut Oil]   ?  Throat feels scratchy  ? Shellfish Allergy   ?  Nasal congestion, throat scratchy, eyes watery  ? Sulfamethoxazole   ?  REACTION: urticaria (hives)  ? Vioxx [Rofecoxib] Swelling  ? ? ?Current Outpatient Medications on File Prior to Visit   ?Medication Sig Dispense Refill  ? atenolol-chlorthalidone (TENORETIC) 50-25 MG tablet Take 0.5 tablets by mouth daily. 45 tablet 2  ? ACCU-CHEK GUIDE test strip USE ACCU CHEK AVIVA TEST STRIPS AS INSTRUCTED TO CHECK BLOOD SUGAR ONCE DAILY. 100 strip 2  ? Accu-Chek Softclix Lancets lancets USE AS DIRECTED EVERY DAY 100 each 1  ? acetaminophen (TYLENOL) 500 MG tablet Take 1 tablet (500 mg total) by mouth every 6 (six) hours as needed. 30 tablet 0  ? Blood Glucose Monitoring Suppl (ACCU-CHEK AVIVA PLUS) w/Device KIT Use Accu Chek Aviva Plus to check blood sugar once daily. 1 kit 0  ? calcium citrate-vitamin D (CITRACAL+D) 315-200 MG-UNIT per tablet Take 1 tablet by mouth daily.    ? cholecalciferol (VITAMIN D) 1000 UNITS tablet Take 2,000 Units by mouth daily.    ? clotrimazole-betamethasone (LOTRISONE) cream Apply 1 application topically 2 (two) times daily. (Patient not taking: Reported on 02/07/2022)    ? FeFum-FePo-FA-B Cmp-C-Zn-Mn-Cu (SE-TAN PLUS) 162-115.2-1 MG CAPS TAKE 1 CAPSULE BY MOUTH EVERY DAY 90 capsule 3  ? glimepiride (AMARYL) 4 MG tablet TAKE 1 TABLET BY MOUTH DAILY AT SUPPER. 90 tablet 1  ? hydrOXYzine (ATARAX/VISTARIL) 25 MG tablet Take 25 mg by mouth 3 (three) times daily as needed. Take 1/2 tablet bid for itching    ? Lancets Misc. (ACCU-CHEK FASTCLIX LANCET) KIT by Does not apply route. USE LANCET TO CHECK BLOOD SUGAR ONCE DAILY.    ? loratadine (CLARITIN) 10 MG tablet Take 1 tablet (10 mg total) by mouth 2 (two) times daily as needed for allergies. 180 tablet 3  ? metFORMIN (GLUCOPHAGE-XR) 500 MG 24 hr tablet TAKE 2 TABLETS BY MOUTH EVERY DAY WITH SUPPER 180 tablet 1  ? omeprazole (PRILOSEC) 20 MG capsule Take 20 mg by mouth daily.    ? simvastatin (ZOCOR) 20 MG tablet TAKE 1 TABLET BY MOUTH EVERY DAY (Patient not taking: Reported on 02/07/2022) 90 tablet 1  ? spironolactone (ALDACTONE) 50 MG tablet TAKE 1 TABLET BY MOUTH EVERY DAY 90 tablet 0  ? triamcinolone (KENALOG) 0.1 % Apply 1 application  topically 2 (two) times daily. 45 g 0  ? Triamcinolone Acetonide (TRIAMCINOLONE 0.1 % CREAM : EUCERIN) CREA Apply 1 application topically 2 (two) times daily. 1 each 0  ? ?No current facility-administered medications on file prior to visit.  ? ? ?There were no vitals taken for this visit. ? ? ?   ?  Objective:  ? Physical Exam ?Vitals and nursing note reviewed.  ?Constitutional:   ?   Appearance: Normal appearance.  ?Cardiovascular:  ?   Rate and Rhythm: Normal rate and regular rhythm.  ?   Pulses: Normal pulses.  ?   Heart sounds: Normal heart sounds.  ?Pulmonary:  ?   Effort: Pulmonary effort is normal.  ?   Breath sounds: Normal breath sounds.  ?Skin: ?   General: Skin is warm and dry.  ?Neurological:  ?   General: No focal deficit present.  ?   Mental Status: She is alert and oriented to person, place, and time.  ?Psychiatric:     ?   Mood and Affect: Mood normal.     ?   Behavior: Behavior normal.     ?   Thought Content: Thought content normal.     ?   Judgment: Judgment normal.  ? ?   ?Assessment & Plan:  ?1. Function kidney decreased ?- Will recheck today. Consider referral to Nephrology  ?- Basic Metabolic Panel; Future ? ?[name], NP ? ? ?

## 2022-03-25 ENCOUNTER — Other Ambulatory Visit: Payer: Self-pay | Admitting: Endocrinology

## 2022-06-06 ENCOUNTER — Other Ambulatory Visit: Payer: Medicare Other

## 2022-06-10 ENCOUNTER — Other Ambulatory Visit (INDEPENDENT_AMBULATORY_CARE_PROVIDER_SITE_OTHER): Payer: Medicare Other

## 2022-06-10 DIAGNOSIS — E1165 Type 2 diabetes mellitus with hyperglycemia: Secondary | ICD-10-CM

## 2022-06-10 LAB — BASIC METABOLIC PANEL
BUN: 25 mg/dL — ABNORMAL HIGH (ref 6–23)
CO2: 25 mEq/L (ref 19–32)
Calcium: 9.3 mg/dL (ref 8.4–10.5)
Chloride: 103 mEq/L (ref 96–112)
Creatinine, Ser: 1.29 mg/dL — ABNORMAL HIGH (ref 0.40–1.20)
GFR: 40.02 mL/min — ABNORMAL LOW (ref >60.00)
Glucose, Bld: 162 mg/dL — ABNORMAL HIGH (ref 70–99)
Potassium: 5 mEq/L (ref 3.5–5.1)
Sodium: 139 mEq/L (ref 135–145)

## 2022-06-10 LAB — HEMOGLOBIN A1C: Hgb A1c MFr Bld: 7.4 % — ABNORMAL HIGH (ref 4.6–6.5)

## 2022-06-13 ENCOUNTER — Encounter: Payer: Self-pay | Admitting: Endocrinology

## 2022-06-13 ENCOUNTER — Ambulatory Visit (INDEPENDENT_AMBULATORY_CARE_PROVIDER_SITE_OTHER): Payer: Medicare Other | Admitting: Endocrinology

## 2022-06-13 VITALS — BP 124/78 | HR 83 | Ht 61.0 in | Wt 152.4 lb

## 2022-06-13 DIAGNOSIS — I10 Essential (primary) hypertension: Secondary | ICD-10-CM

## 2022-06-13 DIAGNOSIS — E1165 Type 2 diabetes mellitus with hyperglycemia: Secondary | ICD-10-CM | POA: Diagnosis not present

## 2022-06-13 DIAGNOSIS — E7800 Pure hypercholesterolemia, unspecified: Secondary | ICD-10-CM

## 2022-06-13 MED ORDER — JANUMET XR 50-500 MG PO TB24: ORAL_TABLET | ORAL | 1 refills | Status: DC

## 2022-06-13 MED ORDER — SIMVASTATIN 20 MG PO TABS
20.0000 mg | ORAL_TABLET | Freq: Every day | ORAL | 1 refills | Status: DC
Start: 2022-06-13 — End: 2023-03-19

## 2022-06-13 NOTE — Progress Notes (Signed)
+         Patient ID: Pam Obrien, female   DOB: [birth year], 76 y.o.   MRN: [id number]     Reason for Appointment: Followup for Type 2 Diabetes and related problems   History of Present Illness:          Diagnosis: Type 2 diabetes mellitus, date of diagnosis:  4/13      Past history:  She was initially started on metformin but he thinks that this made her sleepy and had some abdominal discomfort   She also has been tried on other oral hypoglycemic regimens including Jentadueto  since 2014  But she had similar side effects with this  And not clear how regularly she had been taking this until it was discontinued in 5/15  A1c appears to be persistently over 8% Since 2014  She was tried on Invokamet by her PCP but because of excessive urination with this and also some nausea and weakness it was stopped With glyburide alone her blood sugars are poorly controlled and she was referred here for further management She was given Januvia but she did not take this because of expense of the medication   She was referred to the nurse educator and started on basal insulin to improve her control on 06/07/14 This was changed to premixed insulin in 9/15 because of continued poor control and her not being able to afford NovoLog mix insulin or Invokana  She stopped taking insulin in 1/16 because of the cost and was taking only Amaryl.  Recent history:   Hypoglycemic drugs: Glimepiride 4 mg at supper, metformin ER 500 mg in a.m. and pm    Her A1c is back up to 7.4, has been as low as 6.4  Current management, blood sugar patterns and problems identified:  She did not bring her blood sugar monitor again Despite her higher A1c she does not think her blood sugars are below 130  Her fasting blood sugar was 162 in the lab but does not check morning readings at home usually  Has not missed any medications Now she is concerned about the metformin having a significant odor She does not think she has  changed her diet Also not able to do more exercise She has been reluctant to consider brand-name medications  Glucose monitoring with Accu-Chek meter   By recall Pc <130  Side effects from medications have been: Metformin higher doses will cause abdominal discomfort  Self-care: The diet that the patient has been following is: tries to limit  portions      Meals:  usually 2-3 meals per day. Usually has variable intake in the morning, sometimes toast/fruit, usually has carbohydrates at dinner time, will have fruit for snacks        Dietician visit: Most recent: 6/17 .              CDE visit last in 7/15  Wt Readings from Last 3 Encounters:  06/13/22 152 lb 6.4 oz (69.1 kg)  03/06/22 151 lb (68.5 kg)  02/07/22 153 lb 3.2 oz (69.5 kg)       Glycemic control:   Lab Results  Component Value Date   HGBA1C 7.4 (H) 06/10/2022   HGBA1C 6.9 (A) 02/07/2022   HGBA1C 7.4 (H) 09/05/2021   Lab Results  Component Value Date   MICROALBUR 1.2 02/07/2022   LDLCALC 148 (H) 02/07/2022   CREATININE 1.29 (H) 06/10/2022    Lab on 06/10/2022  Component Date Value Ref Range Status  Sodium 06/10/2022 139  135 - 145 mEq/L Final   Potassium 06/10/2022 5.0  3.5 - 5.1 mEq/L Final   Chloride 06/10/2022 103  96 - 112 mEq/L Final   CO2 06/10/2022 25  19 - 32 mEq/L Final   Glucose, Bld 06/10/2022 162 (H)  70 - 99 mg/dL Final   BUN 06/10/2022 25 (H)  6 - 23 mg/dL Final   Creatinine, Ser 06/10/2022 1.29 (H)  0.40 - 1.20 mg/dL Final   GFR 06/10/2022 40.02 (L)  >60.00 mL/min Final   Calculated using the CKD-EPI Creatinine Equation (2021)   Calcium 06/10/2022 9.3  8.4 - 10.5 mg/dL Final   Hgb A1c MFr Bld 06/10/2022 7.4 (H)  4.6 - 6.5 % Final   Glycemic Control Guidelines for People with Diabetes:Non Diabetic:  <6%Goal of Therapy: <7%Additional Action Suggested:  >8%      Weight history:  Previous range upto 172   Wt Readings from Last 3 Encounters:  06/13/22 152 lb 6.4 oz (69.1 kg)  03/06/22 151  lb (68.5 kg)  02/07/22 153 lb 3.2 oz (69.5 kg)    Allergies as of 06/13/2022       Reactions   Aspirin    REACTION: nervous   Beef-derived Products    Sneezing, nasal drainage, throat scratchy   Celecoxib    REACTION: joints swell   Milk-related Compounds    itching   Mold Extract [trichophyton]    Pt does not know the reaction.  Positive allergy test per pt.   Nsaids    REACTION: sweling   Peanuts [peanut Oil]    Throat feels scratchy   Shellfish Allergy    Nasal congestion, throat scratchy, eyes watery   Sulfamethoxazole    REACTION: urticaria (hives)   Vioxx [rofecoxib] Swelling        Medication List        Accurate as of June 13, 2022 11:24 AM. If you have any questions, ask your nurse or doctor.          Accu-Chek Aviva Plus w/Device Kit Use Accu Chek Aviva Plus to check blood sugar once daily.   Accu-Chek [employer] Kit by Does not apply route. USE LANCET TO CHECK BLOOD SUGAR ONCE DAILY.   Accu-Chek Guide test strip Generic drug: glucose blood USE ACCU CHEK AVIVA TEST STRIPS AS INSTRUCTED TO CHECK BLOOD SUGAR ONCE DAILY.   Accu-Chek Softclix Lancets lancets USE AS DIRECTED EVERY DAY   acetaminophen 500 MG tablet Commonly known as: TYLENOL Take 1 tablet (500 mg total) by mouth every 6 (six) hours as needed.   atenolol-chlorthalidone 50-25 MG tablet Commonly known as: TENORETIC Take 0.5 tablets by mouth daily.   calcium citrate-vitamin D 315-200 MG-UNIT tablet Commonly known as: CITRACAL+D Take 1 tablet by mouth daily.   cholecalciferol 1000 units tablet Commonly known as: VITAMIN D Take 2,000 Units by mouth daily.   clotrimazole-betamethasone cream Commonly known as: LOTRISONE Apply 1 application. topically 2 (two) times daily.   glimepiride 4 MG tablet Commonly known as: AMARYL TAKE 1 TABLET BY MOUTH DAILY AT SUPPER.   hydrOXYzine 25 MG tablet Commonly known as: ATARAX Take 25 mg by mouth 3 (three) times daily as needed. Take  1/2 tablet bid for itching   loratadine 10 MG tablet Commonly known as: CLARITIN Take 1 tablet (10 mg total) by mouth 2 (two) times daily as needed for allergies.   metFORMIN 500 MG 24 hr tablet Commonly known as: GLUCOPHAGE-XR TAKE 2 TABLETS BY MOUTH EVERY DAY WITH  SUPPER   omeprazole 20 MG capsule Commonly known as: PRILOSEC Take 20 mg by mouth daily.   Se-Tan PLUS 162-115.2-1 MG Caps TAKE 1 CAPSULE BY MOUTH EVERY DAY   simvastatin 20 MG tablet Commonly known as: ZOCOR TAKE 1 TABLET BY MOUTH EVERY DAY   spironolactone 50 MG tablet Commonly known as: ALDACTONE TAKE 1 TABLET BY MOUTH EVERY DAY   triamcinolone 0.1 % cream : eucerin Crea Apply 1 application topically 2 (two) times daily.   triamcinolone cream 0.1 % Commonly known as: KENALOG Apply 1 application topically 2 (two) times daily.        Allergies:  Allergies  Allergen Reactions   Aspirin     REACTION: nervous   Beef-Derived Products     Sneezing, nasal drainage, throat scratchy   Celecoxib     REACTION: joints swell   Milk-Related Compounds     itching   Mold Extract [Trichophyton]     Pt does not know the reaction.  Positive allergy test per pt.   Nsaids     REACTION: sweling   Peanuts [Peanut Oil]     Throat feels scratchy   Shellfish Allergy     Nasal congestion, throat scratchy, eyes watery   Sulfamethoxazole     REACTION: urticaria (hives)   Vioxx [Rofecoxib] Swelling    Past Medical History:  Diagnosis Date   Allergy    Anemia    as a young adult   Arthritis    Diabetes mellitus without complication ([place])    Hyperlipidemia    was normal range 05/2017 with PCP   Hypertension     Past Surgical History:  Procedure Laterality Date   ABDOMINAL HYSTERECTOMY  1994   COLONOSCOPY  2008   FLEXIBLE SIGMOIDOSCOPY  2003   POLYPECTOMY      Family History  Problem Relation Age of Onset   Hypertension Sister    Stroke Other    Diabetes Father    Hypertension Father    Stroke Father     Diabetes Sister    Diabetes Brother    Hypertension Brother    Stroke Brother    Cancer Mother    Esophageal cancer Neg Hx    Rectal cancer Neg Hx    Stomach cancer Neg Hx    Colon cancer Neg Hx     Social History:  reports that she has never smoked. She has never used smokeless tobacco. She reports that she does not drink alcohol and does not use drugs.    Review of Systems    HYPERTENSION: She takes a half a tablet of atenolol/chlorthalidone along with half tablet of spironolactone 50 mg Does not check blood pressure at home Prescription given by PCP for Tenoretic  BP Readings from Last 3 Encounters:  06/13/22 124/78  03/06/22 138/86  02/07/22 124/82    HYPOKALEMIA: This is secondary to HCTZ It is controlled with Aldactone Taking a half of a 50 mg tablet daily  Lab Results  Component Value Date   K 5.0 06/10/2022   Renal function history  Lab Results  Component Value Date   CREATININE 1.29 (H) 06/10/2022   CREATININE 1.27 (H) 03/06/2022   CREATININE 1.34 (H) 02/07/2022      Lipids: She did not take pravastatin previously prescribed because of cost Started simvastatin in 7/19   If she takes this daily she will have pain in her arm  She was told to try taking this every day since her muscle aches were not significant  She misunderstood the instructions and was starting to take simvastatin twice a day, she has ran out a month ago   She does have family history of CVA but no known history of CAD    Lab Results  Component Value Date   CHOL 223 (H) 02/07/2022   CHOL 179 09/05/2021   CHOL 170 01/01/2021   Lab Results  Component Value Date   HDL 56.60 02/07/2022   HDL 54.60 09/05/2021   HDL 47.70 01/01/2021   Lab Results  Component Value Date   LDLCALC 148 (H) 02/07/2022   LDLCALC 113 (H) 09/05/2021   LDLCALC 100 (H) 01/01/2021   Lab Results  Component Value Date   TRIG 92.0 02/07/2022   TRIG 55.0 09/05/2021   TRIG 113.0 01/01/2021   Lab  Results  Component Value Date   CHOLHDL 4 02/07/2022   CHOLHDL 3 09/05/2021   CHOLHDL 4 01/01/2021   Lab Results  Component Value Date   LDLDIRECT 98.0 12/02/2019   LDLDIRECT 130.0 05/17/2016   LDLDIRECT 121.7 03/04/2012     Last foot exam was in 3/23  She has had routine thyroid tests and TSH has been periodically slightly above normal She does complain of fatigue   Lab Results  Component Value Date   TSH 5.49 02/07/2022   TSH 2.56 09/05/2021   TSH 1.98 01/01/2021   FREET4 0.84 09/05/2021   FREET4 0.78 01/01/2021   FREET4 1.0 10/20/2020    Physical Examination:  BP 124/78   Pulse 83   Ht '5\' 1"'  (1.549 m)   Wt 152 lb 6.4 oz (69.1 kg)   SpO2 97%   BMI 28.80 kg/m       ASSESSMENT:  Diabetes type 2 BMI under 30, non-insulin-dependent  See history of present illness for detailed discussion of  current management, blood sugar patterns and problems identified  A1c is 7.4 compared to 6.9   She is on Amaryl 4 mg and Metformin She has likely some progression of her diabetes as she has not changed her diet but her A1c is higher  Home monitoring is inadequate May well be having mostly high overnight readings with fasting lab glucose 162  Hypercholesterolemia: She misunderstood her instructions for the medication and has not taken any simvastatin for the last month   History of hypokalemia: Controlled with Aldactone as before  HYPERTENSION: Blood pressure is good today    PLAN:   Start checking blood sugars regularly, alternating fasting and by rotation 2 hours after meals At least some walking for exercise Try 50/500 Janumet XR instead of metformin, this should help with better control than not have the smell with the metformin tablets She will go back to taking only 1 tablet simvastatin daily To have lipids on the next visit    There are no Patient Instructions on file for this visit.      Elayne Snare 06/13/2022, 11:24 AM   Note: This office note  was prepared with Dragon voice recognition system technology. Any transcriptional errors that result from this process are unintentional.

## 2022-06-13 NOTE — Patient Instructions (Signed)
Check blood sugars on waking up 2-3 days a week  Also check blood sugars about 2 hours after meals and do this after different meals by rotation  Recommended blood sugar levels on waking up are 90-130 and about 2 hours after meal is 130-180  Please bring your blood sugar monitor to each visit, thank you

## 2022-06-28 ENCOUNTER — Telehealth: Payer: Self-pay | Admitting: Adult Health

## 2022-06-28 ENCOUNTER — Ambulatory Visit: Payer: Medicare Other | Admitting: Adult Health

## 2022-06-28 NOTE — Telephone Encounter (Signed)
Left message for patient to call back and schedule Medicare Annual Wellness Visit (AWV) either virtually or in office. Left  my Herbie Drape number [phone]   Last AWV 07/03/21 ; please schedule at anytime with [facility] Nurse Health Advisor 1 or 2

## 2022-07-05 ENCOUNTER — Ambulatory Visit (INDEPENDENT_AMBULATORY_CARE_PROVIDER_SITE_OTHER): Payer: Medicare Other | Admitting: Adult Health

## 2022-07-05 ENCOUNTER — Ambulatory Visit (INDEPENDENT_AMBULATORY_CARE_PROVIDER_SITE_OTHER): Payer: Medicare Other

## 2022-07-05 ENCOUNTER — Encounter: Payer: Self-pay | Admitting: Adult Health

## 2022-07-05 VITALS — BP 130/70 | HR 94 | Temp 98.4°F | Ht 61.0 in | Wt 154.0 lb

## 2022-07-05 DIAGNOSIS — M25662 Stiffness of left knee, not elsewhere classified: Secondary | ICD-10-CM | POA: Diagnosis not present

## 2022-07-05 DIAGNOSIS — D1722 Benign lipomatous neoplasm of skin and subcutaneous tissue of left arm: Secondary | ICD-10-CM

## 2022-07-05 DIAGNOSIS — M1712 Unilateral primary osteoarthritis, left knee: Secondary | ICD-10-CM | POA: Diagnosis not present

## 2022-07-05 NOTE — Patient Instructions (Addendum)
It was great seeing you today   I am going to xray your knee today and see what is going on with it.   You can use motrin or ibuprofen to the discomfort   I will also refer you to a hand surgeon to see what they can do about the lipoma

## 2022-07-05 NOTE — Progress Notes (Signed)
Subjective:    Patient ID: Pam Obrien, female    DOB: 06-27-1946, 76 y.o.   MRN: [id number]  HPI  76 year old female who  has a past medical history of Allergy, Anemia, Arthritis, Diabetes mellitus without complication ([place]), Hyperlipidemia, and Hypertension.  She presents to the office today for the complaint of left knee pain and left hand pain   Left knee pain - she has noticed over the last month that when she gets up from sitting for a long period of time her left knee feels stiff.  After a short period of time of activity of the stiffness resolves.  He denies trauma, aggravating injury, or fall.  She does endorse some slight swelling of the left knee from time to time.  Additionally she has a "bump" on the top of her hand that she feels has grown over an unknown amount of time.  This bump is not painful, there is no redness, or warmth associated with it.   Review of Systems See HPI   Past Medical History:  Diagnosis Date   Allergy    Anemia    as a young adult   Arthritis    Diabetes mellitus without complication ([place])    Hyperlipidemia    was normal range 05/2017 with PCP   Hypertension     Social History   Socioeconomic History   Marital status: Married    Spouse name: Not on file   Number of children: Not on file   Years of education: Not on file   Highest education level: Not on file  Occupational History   Not on file  Tobacco Use   Smoking status: Never   Smokeless tobacco: Never  Vaping Use   Vaping Use: Never used  Substance and Sexual Activity   Alcohol use: No   Drug use: No   Sexual activity: Not on file  Other Topics Concern   Not on file  Social History Narrative   Retired from [employer] work    Separated for 9 years.    Five children, two live locally, three are in [place]   One of her daughters live with her   Has a dog.    She likes to dance and walk   Social Determinants of Health   Financial Resource Strain: Low Risk   (07/12/2021)   Overall Financial Resource Strain (CARDIA)    Difficulty of Paying Living Expenses: Not hard at all  Food Insecurity: No Food Insecurity (07/12/2021)   Hunger Vital Sign    Worried About Running Out of Food in the Last Year: Never true    Sunflower in the Last Year: Never true  Transportation Needs: No Transportation Needs (07/12/2021)   PRAPARE - Hydrologist (Medical): No    Lack of Transportation (Non-Medical): No  Physical Activity: Sufficiently Active (07/12/2021)   Exercise Vital Sign    Days of Exercise per Week: 5 days    Minutes of Exercise per Session: 30 min  Recent Concern: Physical Activity - Insufficiently Active (07/03/2021)   Exercise Vital Sign    Days of Exercise per Week: 3 days    Minutes of Exercise per Session: 30 min  Stress: No Stress Concern Present (07/12/2021)   [place]    Feeling of Stress : Not at all  Social Connections: Socially Isolated (07/12/2021)   Social Connection and Isolation Panel [NHANES]  Frequency of Communication with Friends and Family: Three times a week    Frequency of Social Gatherings with Friends and Family: Three times a week    Attends Religious Services: Never    Active Member of Clubs or Organizations: No    Attends Archivist Meetings: Never    Marital Status: Divorced  Human resources officer Violence: Not At Risk (07/12/2021)   Humiliation, Afraid, Rape, and Kick questionnaire    Fear of Current or Ex-Partner: No    Emotionally Abused: No    Physically Abused: No    Sexually Abused: No    Past Surgical History:  Procedure Laterality Date   ABDOMINAL HYSTERECTOMY  1994   COLONOSCOPY  2008   FLEXIBLE SIGMOIDOSCOPY  2003   POLYPECTOMY      Family History  Problem Relation Age of Onset   Hypertension Sister    Stroke Other    Diabetes Father    Hypertension Father    Stroke Father    Diabetes Sister     Diabetes Brother    Hypertension Brother    Stroke Brother    Cancer Mother    Esophageal cancer Neg Hx    Rectal cancer Neg Hx    Stomach cancer Neg Hx    Colon cancer Neg Hx     Allergies  Allergen Reactions   Aspirin     REACTION: nervous   Beef-Derived Products     Sneezing, nasal drainage, throat scratchy   Celecoxib     REACTION: joints swell   Milk-Related Compounds     itching   Mold Extract [Trichophyton]     Pt does not know the reaction.  Positive allergy test per pt.   Nsaids     REACTION: sweling   Peanuts [Peanut Oil]     Throat feels scratchy   Shellfish Allergy     Nasal congestion, throat scratchy, eyes watery   Sulfamethoxazole     REACTION: urticaria (hives)   Vioxx [Rofecoxib] Swelling    Current Outpatient Medications on File Prior to Visit  Medication Sig Dispense Refill   ACCU-CHEK GUIDE test strip USE ACCU CHEK AVIVA TEST STRIPS AS INSTRUCTED TO CHECK BLOOD SUGAR ONCE DAILY. 100 strip 2   Accu-Chek Softclix Lancets lancets USE AS DIRECTED EVERY DAY 100 each 1   acetaminophen (TYLENOL) 500 MG tablet Take 1 tablet (500 mg total) by mouth every 6 (six) hours as needed. 30 tablet 0   atenolol-chlorthalidone (TENORETIC) 50-25 MG tablet Take 0.5 tablets by mouth daily. 45 tablet 2   Blood Glucose Monitoring Suppl (ACCU-CHEK AVIVA PLUS) w/Device KIT Use Accu Chek Aviva Plus to check blood sugar once daily. 1 kit 0   calcium citrate-vitamin D (CITRACAL+D) 315-200 MG-UNIT per tablet Take 1 tablet by mouth daily.     cholecalciferol (VITAMIN D) 1000 UNITS tablet Take 2,000 Units by mouth daily.     clotrimazole-betamethasone (LOTRISONE) cream Apply 1 application. topically 2 (two) times daily.     FeFum-FePo-FA-B Cmp-C-Zn-Mn-Cu (SE-TAN PLUS) 162-115.2-1 MG CAPS TAKE 1 CAPSULE BY MOUTH EVERY DAY 90 capsule 3   glimepiride (AMARYL) 4 MG tablet TAKE 1 TABLET BY MOUTH DAILY AT SUPPER. 90 tablet 1   hydrOXYzine (ATARAX/VISTARIL) 25 MG tablet Take 25 mg by mouth 3  (three) times daily as needed. Take 1/2 tablet bid for itching     Lancets Misc. (ACCU-CHEK FASTCLIX LANCET) KIT by Does not apply route. USE LANCET TO CHECK BLOOD SUGAR ONCE DAILY.     loratadine (CLARITIN)  10 MG tablet Take 1 tablet (10 mg total) by mouth 2 (two) times daily as needed for allergies. 180 tablet 3   omeprazole (PRILOSEC) 20 MG capsule Take 20 mg by mouth daily.     simvastatin (ZOCOR) 20 MG tablet Take 1 tablet (20 mg total) by mouth daily. 90 tablet 1   SitaGLIPtin-MetFORMIN HCl (JANUMET XR) 50-500 MG TB24 2 tabs with dinner daily 60 tablet 1   spironolactone (ALDACTONE) 50 MG tablet TAKE 1 TABLET BY MOUTH EVERY DAY 90 tablet 0   triamcinolone (KENALOG) 0.1 % Apply 1 application topically 2 (two) times daily. 45 g 0   Triamcinolone Acetonide (TRIAMCINOLONE 0.1 % CREAM : EUCERIN) CREA Apply 1 application topically 2 (two) times daily. 1 each 0   No current facility-administered medications on file prior to visit.    BP 130/70   Pulse 94   Temp 98.4 F (36.9 C) (Oral)   Ht '5\' 1"'  (1.549 m)   Wt 154 lb (69.9 kg)   SpO2 98%   BMI 29.10 kg/m       Objective:   Physical Exam Vitals and nursing note reviewed.  Constitutional:      Appearance: Normal appearance.  Musculoskeletal:     Left knee: Crepitus present. No swelling, deformity or bony tenderness. Normal range of motion. No tenderness. No LCL laxity, MCL laxity, ACL laxity or PCL laxity.Normal pulse.     Comments: Quarter sized lipoma noted on the dorsum of her left hand.  Neurological:     General: No focal deficit present.     Mental Status: She is alert and oriented to person, place, and time.  Psychiatric:        Mood and Affect: Mood normal.        Behavior: Behavior normal.        Thought Content: Thought content normal.        Judgment: Judgment normal.       Assessment & Plan:  1. Lipoma of left upper extremity -Advised that the bump she has noticed is fatty tissue called a lipoma.  She feels as  though it is increasing and would like to talk to a hand surgeon to see if it can be removed. - Ambulatory referral to Hand Surgery  2. Knee stiffness, left -Discussed treatment options including physical therapy, steroid injections, and anti-inflammatory medication for suspected arthritis.  She would like to have an x-ray done before deciding on a treatment plan - DG Knee 1-2 Views Left; Future  [name], NP

## 2022-07-10 ENCOUNTER — Telehealth: Payer: Self-pay | Admitting: Adult Health

## 2022-07-10 NOTE — Telephone Encounter (Signed)
Pt called, returning CMA's call. CMA was unavailable. Pt asked that CMA call back at their earliest convenience. 

## 2022-07-11 ENCOUNTER — Telehealth: Payer: Self-pay

## 2022-07-11 ENCOUNTER — Telehealth: Payer: Self-pay | Admitting: Adult Health

## 2022-07-11 NOTE — Telephone Encounter (Signed)
I did not call pt. Per chart note pt had a MWV but did not answer. Per [facility] nurse this will get rescheduled. Pt notified of update.

## 2022-07-11 NOTE — Telephone Encounter (Signed)
Patient notified of update  and verbalized understanding. 

## 2022-07-11 NOTE — Telephone Encounter (Signed)
Pt is returning kendra call 

## 2022-07-11 NOTE — Telephone Encounter (Signed)
Pt called, returning CMA's call. CMA was unavailable. Pt asked that CMA call back at their earliest convenience. 

## 2022-07-11 NOTE — Telephone Encounter (Signed)
Unsuccessful attempt to reach patient on preferred number listed in notes for scheduled AWV. Left message on voicemail okay to reschedule. 

## 2022-07-16 ENCOUNTER — Telehealth: Payer: Self-pay | Admitting: Adult Health

## 2022-07-16 NOTE — Telephone Encounter (Signed)
Left message for patient to call back and schedule Medicare Annual Wellness Visit (AWV) either virtually or in office. Left  my Herbie Drape number [phone]   Last AWV 07/03/21 ; please schedule at anytime with [facility] Nurse Health Advisor 1 or 2

## 2022-07-23 ENCOUNTER — Ambulatory Visit (INDEPENDENT_AMBULATORY_CARE_PROVIDER_SITE_OTHER): Payer: Medicare Other | Admitting: Adult Health

## 2022-07-23 ENCOUNTER — Encounter: Payer: Self-pay | Admitting: Adult Health

## 2022-07-23 VITALS — BP 130/68 | HR 121 | Temp 98.4°F | Ht 61.0 in | Wt 154.0 lb

## 2022-07-23 DIAGNOSIS — M25562 Pain in left knee: Secondary | ICD-10-CM | POA: Diagnosis not present

## 2022-07-23 DIAGNOSIS — R2232 Localized swelling, mass and lump, left upper limb: Secondary | ICD-10-CM | POA: Diagnosis not present

## 2022-07-23 DIAGNOSIS — G8929 Other chronic pain: Secondary | ICD-10-CM | POA: Diagnosis not present

## 2022-07-23 MED ORDER — METHYLPREDNISOLONE ACETATE 80 MG/ML IJ SUSP
80.0000 mg | Freq: Once | INTRAMUSCULAR | Status: AC
  Administered 2022-07-23: 80 mg via INTRA_ARTICULAR

## 2022-07-23 NOTE — Progress Notes (Signed)
Subjective:    Patient ID: Pam Obrien, female    DOB: April 11, 1946, 76 y.o.   MRN: [id number]  HPI 76 year old female who  has a past medical history of Allergy, Anemia, Arthritis, Diabetes mellitus without complication ([place]), Hyperlipidemia, and Hypertension.  She presents to the office today for follow-up regarding left knee pain.  This recent x-ray on 07/05/2022 shows severe tricompartmental degenerative changes and small loose bodies in the joint.  She would like to try a steroid injection to see if this gives her some improvement in her pain before proceeding to orthopedics for evaluation   Review of Systems See HPI   Past Medical History:  Diagnosis Date   Allergy    Anemia    as a young adult   Arthritis    Diabetes mellitus without complication ([place])    Hyperlipidemia    was normal range 05/2017 with PCP   Hypertension     Social History   Socioeconomic History   Marital status: Married    Spouse name: Not on file   Number of children: Not on file   Years of education: Not on file   Highest education level: Not on file  Occupational History   Not on file  Tobacco Use   Smoking status: Never   Smokeless tobacco: Never  Vaping Use   Vaping Use: Never used  Substance and Sexual Activity   Alcohol use: No   Drug use: No   Sexual activity: Not on file  Other Topics Concern   Not on file  Social History Narrative   Retired from [employer] work    Separated for 9 years.    Five children, two live locally, three are in [place]   One of her daughters live with her   Has a dog.    She likes to dance and walk   Social Determinants of Health   Financial Resource Strain: Low Risk  (07/12/2021)   Overall Financial Resource Strain (CARDIA)    Difficulty of Paying Living Expenses: Not hard at all  Food Insecurity: No Food Insecurity (07/12/2021)   Hunger Vital Sign    Worried About Running Out of Food in the Last Year: Never true    Tensed in the Last  Year: Never true  Transportation Needs: No Transportation Needs (07/12/2021)   PRAPARE - Hydrologist (Medical): No    Lack of Transportation (Non-Medical): No  Physical Activity: Sufficiently Active (07/12/2021)   Exercise Vital Sign    Days of Exercise per Week: 5 days    Minutes of Exercise per Session: 30 min  Recent Concern: Physical Activity - Insufficiently Active (07/03/2021)   Exercise Vital Sign    Days of Exercise per Week: 3 days    Minutes of Exercise per Session: 30 min  Stress: No Stress Concern Present (07/12/2021)   [place]    Feeling of Stress : Not at all  Social Connections: Socially Isolated (07/12/2021)   Social Connection and Isolation Panel [NHANES]    Frequency of Communication with Friends and Family: Three times a week    Frequency of Social Gatherings with Friends and Family: Three times a week    Attends Religious Services: Never    Active Member of Clubs or Organizations: No    Attends Archivist Meetings: Never    Marital Status: Divorced  Intimate Partner Violence: Not At Risk (07/12/2021)  Humiliation, Afraid, Rape, and Kick questionnaire    Fear of Current or Ex-Partner: No    Emotionally Abused: No    Physically Abused: No    Sexually Abused: No    Past Surgical History:  Procedure Laterality Date   ABDOMINAL HYSTERECTOMY  1994   COLONOSCOPY  2008   FLEXIBLE SIGMOIDOSCOPY  2003   POLYPECTOMY      Family History  Problem Relation Age of Onset   Hypertension Sister    Stroke Other    Diabetes Father    Hypertension Father    Stroke Father    Diabetes Sister    Diabetes Brother    Hypertension Brother    Stroke Brother    Cancer Mother    Esophageal cancer Neg Hx    Rectal cancer Neg Hx    Stomach cancer Neg Hx    Colon cancer Neg Hx     Allergies  Allergen Reactions   Aspirin     REACTION: nervous   Beef-Derived Products      Sneezing, nasal drainage, throat scratchy   Celecoxib     REACTION: joints swell   Milk-Related Compounds     itching   Mold Extract [Trichophyton]     Pt does not know the reaction.  Positive allergy test per pt.   Nsaids     REACTION: sweling   Peanuts [Peanut Oil]     Throat feels scratchy   Shellfish Allergy     Nasal congestion, throat scratchy, eyes watery   Sulfamethoxazole     REACTION: urticaria (hives)   Vioxx [Rofecoxib] Swelling    Current Outpatient Medications on File Prior to Visit  Medication Sig Dispense Refill   ACCU-CHEK GUIDE test strip USE ACCU CHEK AVIVA TEST STRIPS AS INSTRUCTED TO CHECK BLOOD SUGAR ONCE DAILY. 100 strip 2   Accu-Chek Softclix Lancets lancets USE AS DIRECTED EVERY DAY 100 each 1   acetaminophen (TYLENOL) 500 MG tablet Take 1 tablet (500 mg total) by mouth every 6 (six) hours as needed. 30 tablet 0   atenolol-chlorthalidone (TENORETIC) 50-25 MG tablet Take 0.5 tablets by mouth daily. 45 tablet 2   Blood Glucose Monitoring Suppl (ACCU-CHEK AVIVA PLUS) w/Device KIT Use Accu Chek Aviva Plus to check blood sugar once daily. 1 kit 0   calcium citrate-vitamin D (CITRACAL+D) 315-200 MG-UNIT per tablet Take 1 tablet by mouth daily.     cholecalciferol (VITAMIN D) 1000 UNITS tablet Take 2,000 Units by mouth daily.     clotrimazole-betamethasone (LOTRISONE) cream Apply 1 application. topically 2 (two) times daily.     FeFum-FePo-FA-B Cmp-C-Zn-Mn-Cu (SE-TAN PLUS) 162-115.2-1 MG CAPS TAKE 1 CAPSULE BY MOUTH EVERY DAY 90 capsule 3   glimepiride (AMARYL) 4 MG tablet TAKE 1 TABLET BY MOUTH DAILY AT SUPPER. 90 tablet 1   hydrOXYzine (ATARAX/VISTARIL) 25 MG tablet Take 25 mg by mouth 3 (three) times daily as needed. Take 1/2 tablet bid for itching     Lancets Misc. (ACCU-CHEK FASTCLIX LANCET) KIT by Does not apply route. USE LANCET TO CHECK BLOOD SUGAR ONCE DAILY.     loratadine (CLARITIN) 10 MG tablet Take 1 tablet (10 mg total) by mouth 2 (two) times daily as  needed for allergies. 180 tablet 3   omeprazole (PRILOSEC) 20 MG capsule Take 20 mg by mouth daily.     simvastatin (ZOCOR) 20 MG tablet Take 1 tablet (20 mg total) by mouth daily. 90 tablet 1   SitaGLIPtin-MetFORMIN HCl (JANUMET XR) 50-500 MG TB24 2 tabs with  dinner daily 60 tablet 1   spironolactone (ALDACTONE) 50 MG tablet TAKE 1 TABLET BY MOUTH EVERY DAY 90 tablet 0   triamcinolone (KENALOG) 0.1 % Apply 1 application topically 2 (two) times daily. 45 g 0   Triamcinolone Acetonide (TRIAMCINOLONE 0.1 % CREAM : EUCERIN) CREA Apply 1 application topically 2 (two) times daily. 1 each 0   No current facility-administered medications on file prior to visit.    BP 130/68   Pulse (!) 121   Temp 98.4 F (36.9 C) (Oral)   Ht _0  (1.549 m)   Wt 154 lb (69.9 kg)   BMI 29.10 kg/m       Objective:   Physical Exam Vitals and nursing note reviewed.  Constitutional:      Appearance: Normal appearance.  Cardiovascular:     Rate and Rhythm: Normal rate and regular rhythm.     Pulses: Normal pulses.     Heart sounds: Normal heart sounds.  Pulmonary:     Effort: Pulmonary effort is normal.     Breath sounds: Normal breath sounds.  Musculoskeletal:        General: Normal range of motion.  Skin:    General: Skin is warm and dry.  Neurological:     General: No focal deficit present.     Mental Status: She is alert and oriented to person, place, and time.  Psychiatric:        Mood and Affect: Mood normal.        Behavior: Behavior normal.        Thought Content: Thought content normal.        Judgment: Judgment normal.        Assessment & Plan:  1. Chronic pain of left knee Discussed risks and benefits of corticosteroid injection and patient consented.  After prepping skin with betadine, injected 80 mg depomedrol and 2 cc of plain xylocaine with 22 gauge one and one half inch needle using anterolateral approach and pt tolerated well.  - methylPREDNISolone acetate (DEPO-MEDROL)  injection 80 mg  [name], NP

## 2022-07-23 NOTE — Patient Instructions (Signed)
It was great seeing you today   We put a steroid injection into your left knee to hopefully help with your pain. This can take up to 3 days to work   Please let us know if you need anything

## 2022-07-24 ENCOUNTER — Other Ambulatory Visit: Payer: Self-pay | Admitting: Orthopedic Surgery

## 2022-07-31 IMAGING — MG DIGITAL DIAGNOSTIC BILAT W/ TOMO W/ CAD
8 series · 9 of 24 positions shown · non-contrast
Comparison: Previous exam(s).

CLINICAL DATA: Patient returns after screening study for evaluation
of possible RIGHT breast mass and possible LEFT breast asymmetry.

EXAM:
DIGITAL DIAGNOSTIC BILATERAL MAMMOGRAM WITH TOMOSYNTHESIS AND CAD
TECHNIQUE: Bilateral digital diagnostic mammography and breast tomosynthesis
was performed. The images were evaluated with computer-aided
detection.

[L MLO synth-2D]
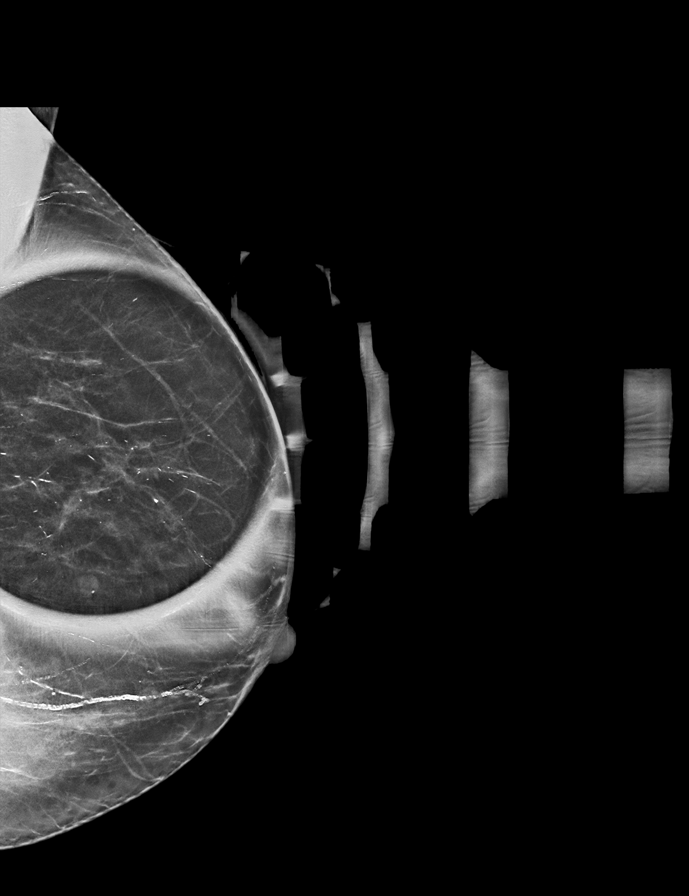

[R CC synth-2D]
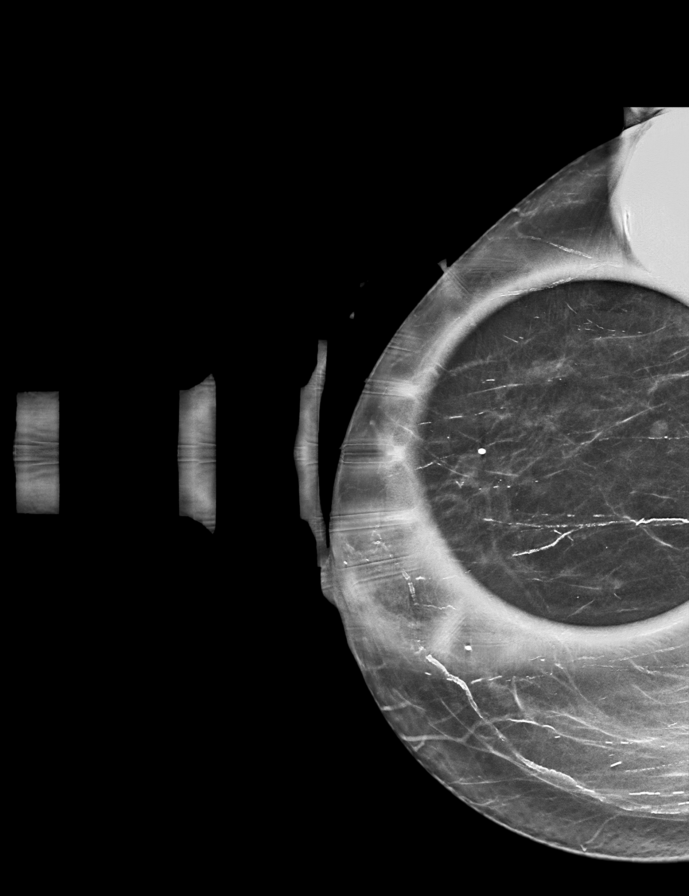

[R MLO synth-2D]
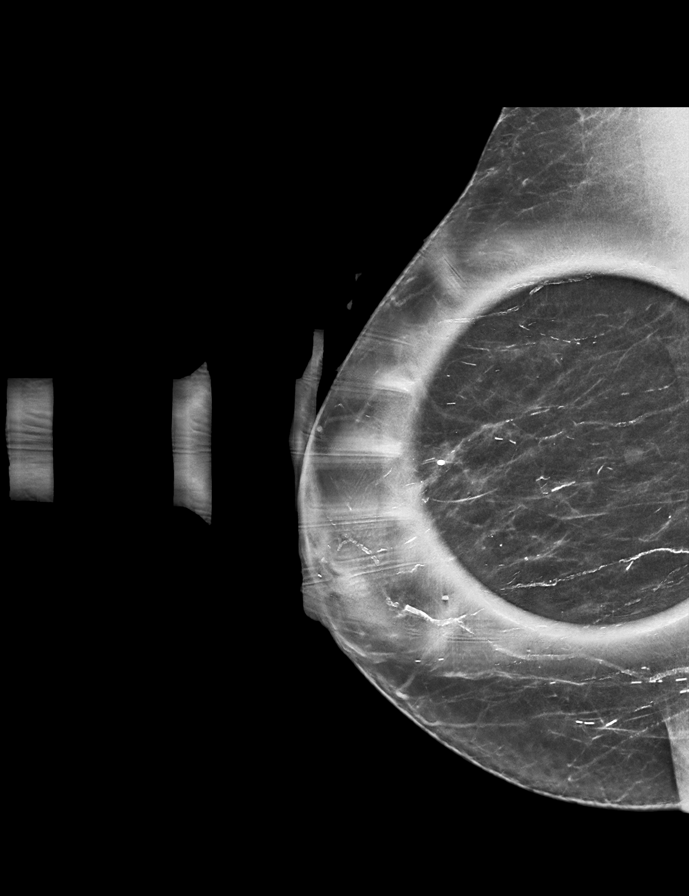

[L ML synth-2D]
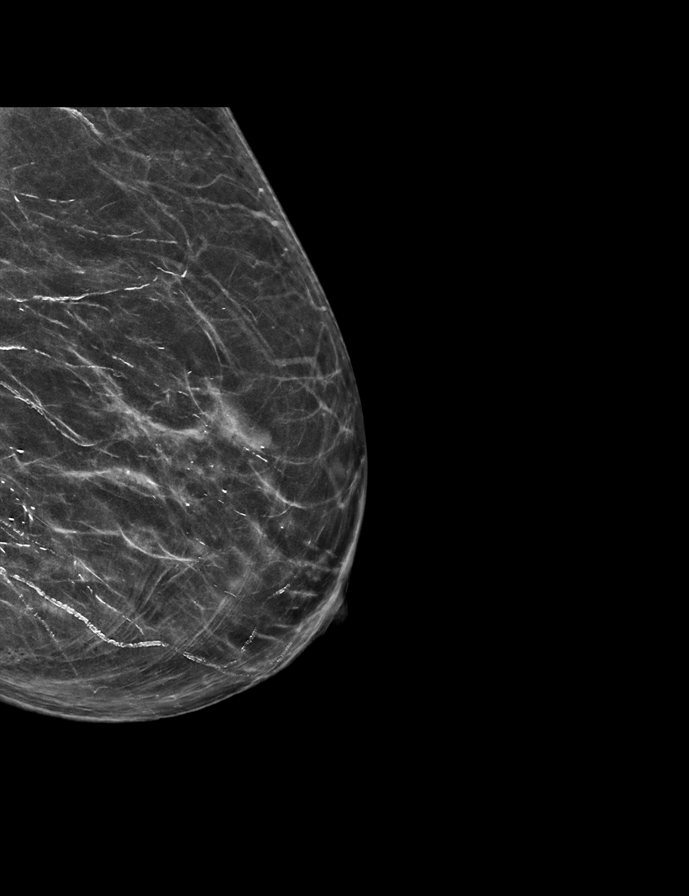

[L MLO tomo · 2 of 47 frames shown]
[frame 16/47]
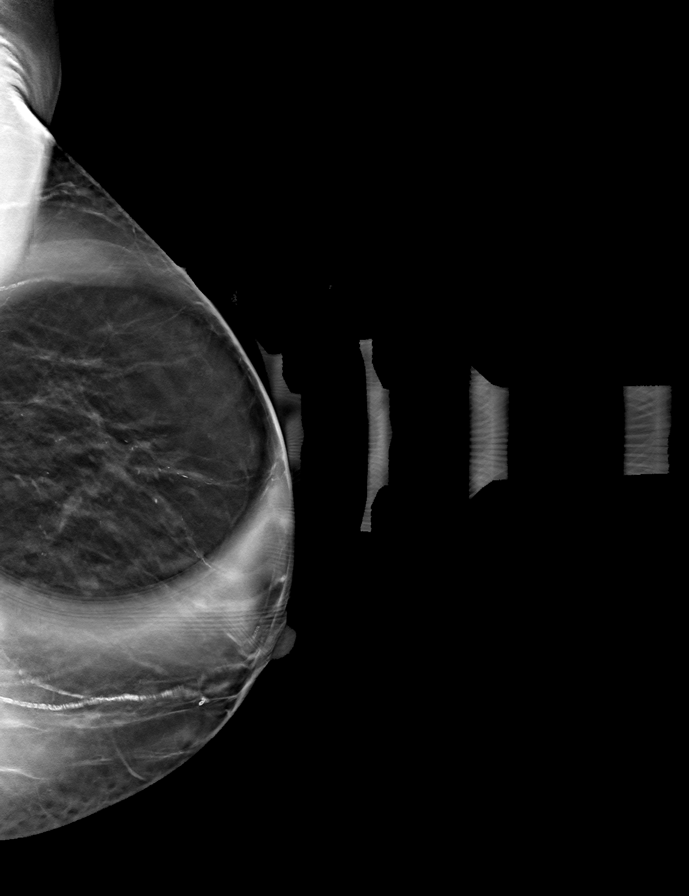
[frame 24/47]
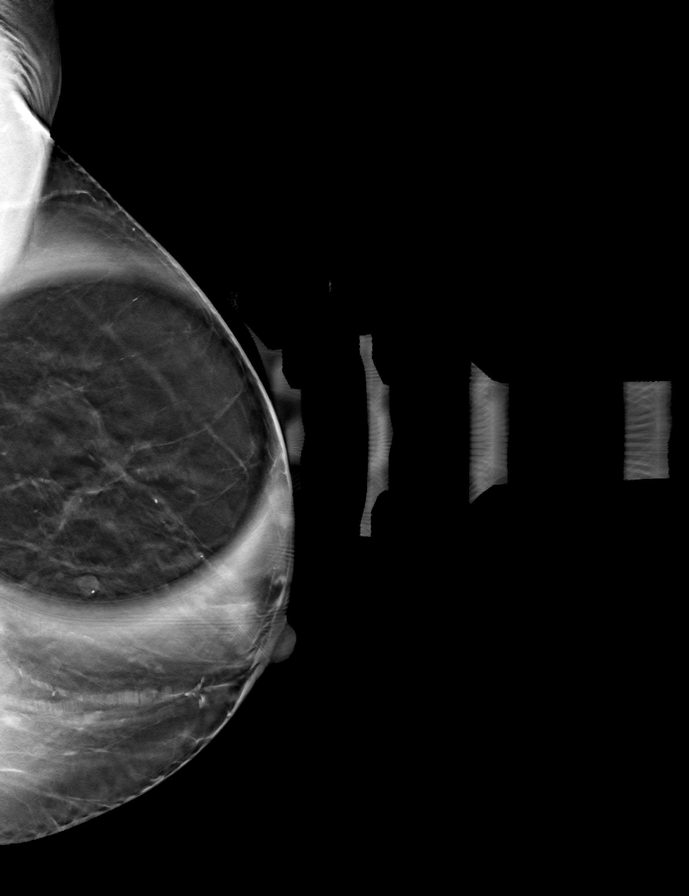

[R MLO tomo · tomo slice 29/57.0]
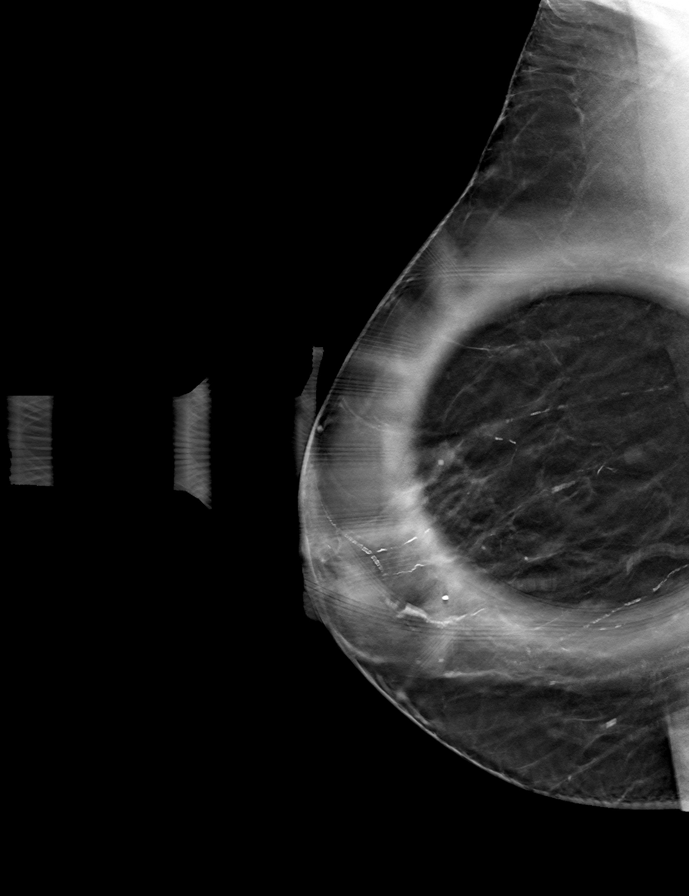

[R CC tomo · tomo slice 26/51.0]
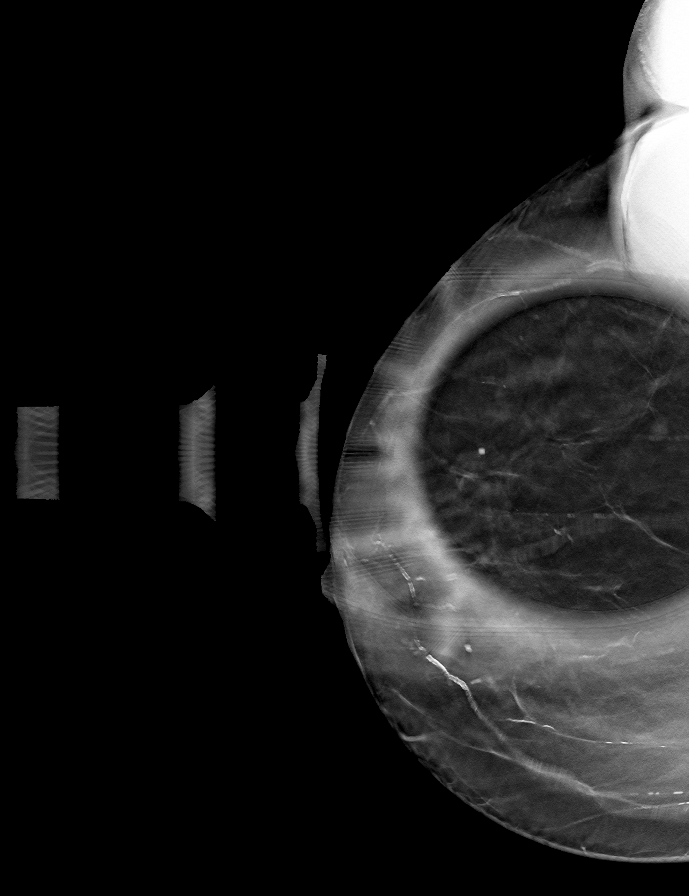

[L ML tomo · tomo slice 32/63.0]
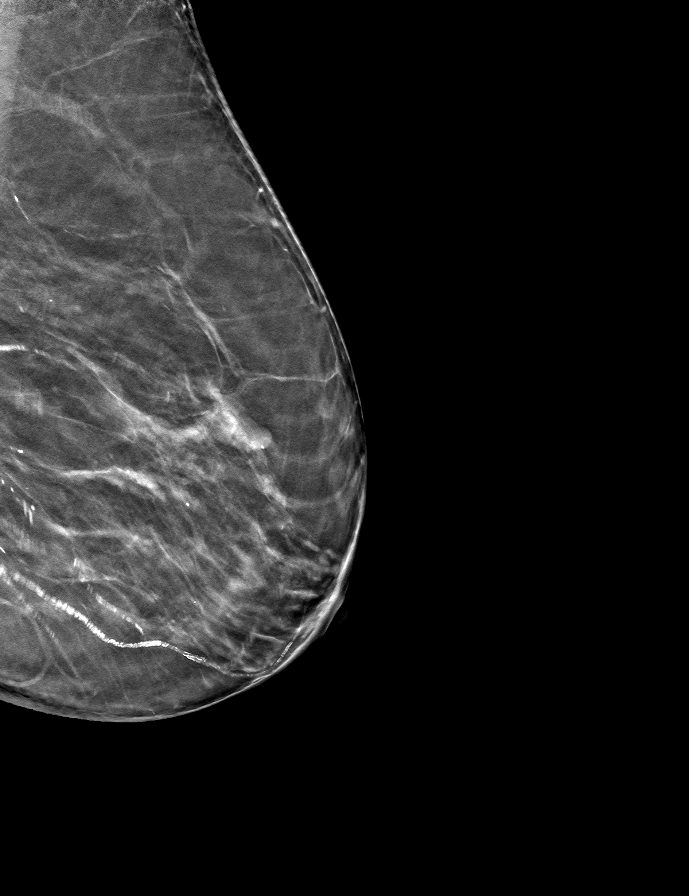

[9 of 24 positions shown; findings below may reference images not displayed]

ACR Breast Density Category b: There are scattered areas of
fibroglandular density.
FINDINGS: Additional 2-D and 3-D images are performed. These views
circumscribed oval mass in the posterior UPPER OUTER QUADRANT of the
RIGHT breast, demonstrating long-term stability and prior studies
and consistent with benign intramammary lymph node.

Within the UPPER portion of the LEFT breast, no persistent asymmetry
is identified.
IMPRESSION: No mammographic evidence for malignancy.

RECOMMENDATION:
Screening mammogram in one year.(Code:TZ-0-5HG)

I have discussed the findings and recommendations with the patient.
If applicable, a reminder letter will be sent to the patient
regarding the next appointment.

BI-RADS CATEGORY  1: Negative.

## 2022-08-06 ENCOUNTER — Telehealth: Payer: Self-pay | Admitting: Adult Health

## 2022-08-06 NOTE — Telephone Encounter (Signed)
Hassan Rowan from [facility] called to FU on fax sent on 07/24/22 regarding surgical clearance   Surgery scheduled for 09/06/22  Please fax clearance to:  Fax:  [phone]

## 2022-08-06 NOTE — Telephone Encounter (Signed)
LVM stating that fax was not received & asking her to refax.

## 2022-08-14 ENCOUNTER — Ambulatory Visit (INDEPENDENT_AMBULATORY_CARE_PROVIDER_SITE_OTHER): Payer: Medicare Other

## 2022-08-14 VITALS — Ht 61.0 in | Wt 154.0 lb

## 2022-08-14 DIAGNOSIS — Z0000 Encounter for general adult medical examination without abnormal findings: Secondary | ICD-10-CM

## 2022-08-14 NOTE — Patient Instructions (Addendum)
[name] , Thank you for taking time to come for your Medicare Wellness Visit. I appreciate your ongoing commitment to your health goals. Please review the following plan we discussed and let me know if I can assist you in the future.   These are the goals we discussed:  Goals       DIET - INCREASE WATER INTAKE      Drink at least 64 ounces of water day      patient      Explore new recipes Different when working;  Try new foods;  Will see [name] regarding appetite changes       Patient stated (pt-stated)      I want to be debt free.      Prevent Falls      Reduce sugar intake to X grams per day        This is a list of the screening recommended for you and due dates:  Health Maintenance  Topic Date Due   Eye exam for diabetics  06/21/2021   COVID-19 Vaccine (1) 08/30/2022*   Zoster (Shingles) Vaccine (1 of 2) 11/14/2022*   Flu Shot  02/09/2023*   Tetanus Vaccine  08/15/2023*   Hemoglobin A1C  12/11/2022   Yearly kidney health urinalysis for diabetes  02/08/2023   Complete foot exam   02/08/2023   Yearly kidney function blood test for diabetes  06/11/2023   Pneumonia Vaccine  Completed   DEXA scan (bone density measurement)  Completed   Hepatitis C Screening: USPSTF Recommendation to screen - Ages 72-79 yo.  Completed   HPV Vaccine  Aged Out   Colon Cancer Screening  Discontinued  *Topic was postponed. The date shown is not the original due date.    Advanced directives: Advance directive discussed with you today. Even though you declined this today, please call our office should you change your mind, and we can give you the proper paperwork for you to fill out.   Conditions/risks identified: None  Next appointment: Follow up in one year for your annual wellness visit    Preventive Care 65 Years and Older, Female Preventive care refers to lifestyle choices and visits with your health care provider that can promote health and wellness. What does preventive care  include? A yearly physical exam. This is also called an annual well check. Dental exams once or twice a year. Routine eye exams. Ask your health care provider how often you should have your eyes checked. Personal lifestyle choices, including: Daily care of your teeth and gums. Regular physical activity. Eating a healthy diet. Avoiding tobacco and drug use. Limiting alcohol use. Practicing safe sex. Taking low-dose aspirin every day. Taking vitamin and mineral supplements as recommended by your health care provider. What happens during an annual well check? The services and screenings done by your health care provider during your annual well check will depend on your age, overall health, lifestyle risk factors, and family history of disease. Counseling  Your health care provider may ask you questions about your: Alcohol use. Tobacco use. Drug use. Emotional well-being. Home and relationship well-being. Sexual activity. Eating habits. History of falls. Memory and ability to understand (cognition). Work and work Statistician. Reproductive health. Screening  You may have the following tests or measurements: Height, weight, and BMI. Blood pressure. Lipid and cholesterol levels. These may be checked every 5 years, or more frequently if you are over 41 years old. Skin check. Lung cancer screening. You may have this screening every  year starting at age 29 if you have a 30-pack-year history of smoking and currently smoke or have quit within the past 15 years. Fecal occult blood test (FOBT) of the stool. You may have this test every year starting at age 20. Flexible sigmoidoscopy or colonoscopy. You may have a sigmoidoscopy every 5 years or a colonoscopy every 10 years starting at age 34. Hepatitis C blood test. Hepatitis B blood test. Sexually transmitted disease (STD) testing. Diabetes screening. This is done by checking your blood sugar (glucose) after you have not eaten for a while  (fasting). You may have this done every 1-3 years. Bone density scan. This is done to screen for osteoporosis. You may have this done starting at age 24. Mammogram. This may be done every 1-2 years. Talk to your health care provider about how often you should have regular mammograms. Talk with your health care provider about your test results, treatment options, and if necessary, the need for more tests. Vaccines  Your health care provider may recommend certain vaccines, such as: Influenza vaccine. This is recommended every year. Tetanus, diphtheria, and acellular pertussis (Tdap, Td) vaccine. You may need a Td booster every 10 years. Zoster vaccine. You may need this after age 69. Pneumococcal 13-valent conjugate (PCV13) vaccine. One dose is recommended after age 64. Pneumococcal polysaccharide (PPSV23) vaccine. One dose is recommended after age 25. Talk to your health care provider about which screenings and vaccines you need and how often you need them. This information is not intended to replace advice given to you by your health care provider. Make sure you discuss any questions you have with your health care provider. Document Released: 11/24/2015 Document Revised: 07/17/2016 Document Reviewed: 08/29/2015 Elsevier Interactive Patient Education  2017 [place] Prevention in the Home Falls can cause injuries. They can happen to people of all ages. There are many things you can do to make your home safe and to help prevent falls. What can I do on the outside of my home? Regularly fix the edges of walkways and driveways and fix any cracks. Remove anything that might make you trip as you walk through a door, such as a raised step or threshold. Trim any bushes or trees on the path to your home. Use bright outdoor lighting. Clear any walking paths of anything that might make someone trip, such as rocks or tools. Regularly check to see if handrails are loose or broken. Make sure that  both sides of any steps have handrails. Any raised decks and porches should have guardrails on the edges. Have any leaves, snow, or ice cleared regularly. Use sand or salt on walking paths during winter. Clean up any spills in your garage right away. This includes oil or grease spills. What can I do in the bathroom? Use night lights. Install grab bars by the toilet and in the tub and shower. Do not use towel bars as grab bars. Use non-skid mats or decals in the tub or shower. If you need to sit down in the shower, use a plastic, non-slip stool. Keep the floor dry. Clean up any water that spills on the floor as soon as it happens. Remove soap buildup in the tub or shower regularly. Attach bath mats securely with double-sided non-slip rug tape. Do not have throw rugs and other things on the floor that can make you trip. What can I do in the bedroom? Use night lights. Make sure that you have a light by your bed that  is easy to reach. Do not use any sheets or blankets that are too big for your bed. They should not hang down onto the floor. Have a firm chair that has side arms. You can use this for support while you get dressed. Do not have throw rugs and other things on the floor that can make you trip. What can I do in the kitchen? Clean up any spills right away. Avoid walking on wet floors. Keep items that you use a lot in easy-to-reach places. If you need to reach something above you, use a strong step stool that has a grab bar. Keep electrical cords out of the way. Do not use floor polish or wax that makes floors slippery. If you must use wax, use non-skid floor wax. Do not have throw rugs and other things on the floor that can make you trip. What can I do with my stairs? Do not leave any items on the stairs. Make sure that there are handrails on both sides of the stairs and use them. Fix handrails that are broken or loose. Make sure that handrails are as long as the stairways. Check  any carpeting to make sure that it is firmly attached to the stairs. Fix any carpet that is loose or worn. Avoid having throw rugs at the top or bottom of the stairs. If you do have throw rugs, attach them to the floor with carpet tape. Make sure that you have a light switch at the top of the stairs and the bottom of the stairs. If you do not have them, ask someone to add them for you. What else can I do to help prevent falls? Wear shoes that: Do not have high heels. Have rubber bottoms. Are comfortable and fit you well. Are closed at the toe. Do not wear sandals. If you use a stepladder: Make sure that it is fully opened. Do not climb a closed stepladder. Make sure that both sides of the stepladder are locked into place. Ask someone to hold it for you, if possible. Clearly mark and make sure that you can see: Any grab bars or handrails. First and last steps. Where the edge of each step is. Use tools that help you move around (mobility aids) if they are needed. These include: Canes. Walkers. Scooters. Crutches. Turn on the lights when you go into a dark area. Replace any light bulbs as soon as they burn out. Set up your furniture so you have a clear path. Avoid moving your furniture around. If any of your floors are uneven, fix them. If there are any pets around you, be aware of where they are. Review your medicines with your doctor. Some medicines can make you feel dizzy. This can increase your chance of falling. Ask your doctor what other things that you can do to help prevent falls. This information is not intended to replace advice given to you by your health care provider. Make sure you discuss any questions you have with your health care provider. Document Released: 08/24/2009 Document Revised: 04/04/2016 Document Reviewed: 12/02/2014 Elsevier Interactive Patient Education  2017 [employer].

## 2022-08-14 NOTE — Progress Notes (Signed)
Subjective:   Pam Obrien is a 76 y.o. female who presents for Medicare Annual (Subsequent) preventive examination.  Review of Systems    Virtual Visit via Telephone Note  I connected with  Pam Obrien on 08/14/22 at  1:30 PM EDT by telephone and verified that I am speaking with the correct person using two identifiers.  Location: Patient: Home Provider: Office Persons participating in the virtual visit: patient/Nurse Health Advisor   I discussed the limitations, risks, security and privacy concerns of performing an evaluation and management service by telephone and the availability of in person appointments. The patient expressed understanding and agreed to proceed.  Interactive audio and video telecommunications were attempted between this nurse and patient, however failed, due to patient having technical difficulties OR patient did not have access to video capability.  We continued and completed visit with audio only.  Some vital signs may be absent or patient reported.   Pam Peaches, LPN  Cardiac Risk Factors include: advanced age (>44mn, >>24women);diabetes mellitus;hypertension     Objective:    Today's Vitals   08/14/22 1341  Weight: 154 lb (69.9 kg)  Height: _0  (1.549 m)   Body mass index is 29.1 kg/m.     08/14/2022    1:52 PM 07/12/2021   11:32 AM 07/03/2021    1:53 PM 06/30/2020    1:26 PM 07/03/2017   12:55 PM 05/27/2017    8:01 AM 01/22/2017   10:54 AM  Advanced Directives  Does Patient Have a Medical Advance Directive? _1  No No  Would patient like information on creating a medical advance directive? No - Patient declined No - Patient declined No - Patient declined No - Patient declined  Yes (MAU/Ambulatory/Procedural Areas - Information given)     Current Medications (verified) Outpatient Encounter Medications as of 08/14/2022  Medication Sig   ACCU-CHEK GUIDE test strip USE ACCU CHEK AVIVA TEST STRIPS AS INSTRUCTED TO  CHECK BLOOD SUGAR ONCE DAILY.   Accu-Chek Softclix Lancets lancets USE AS DIRECTED EVERY DAY   acetaminophen (TYLENOL) 500 MG tablet Take 1 tablet (500 mg total) by mouth every 6 (six) hours as needed.   atenolol-chlorthalidone (TENORETIC) 50-25 MG tablet Take 0.5 tablets by mouth daily.   Blood Glucose Monitoring Suppl (ACCU-CHEK AVIVA PLUS) w/Device KIT Use Accu Chek Aviva Plus to check blood sugar once daily.   calcium citrate-vitamin D (CITRACAL+D) 315-200 MG-UNIT per tablet Take 1 tablet by mouth daily.   cholecalciferol (VITAMIN D) 1000 UNITS tablet Take 2,000 Units by mouth daily.   clotrimazole-betamethasone (LOTRISONE) cream Apply 1 application. topically 2 (two) times daily.   FeFum-FePo-FA-B Cmp-C-Zn-Mn-Cu (SE-TAN PLUS) 162-115.2-1 MG CAPS TAKE 1 CAPSULE BY MOUTH EVERY DAY   glimepiride (AMARYL) 4 MG tablet TAKE 1 TABLET BY MOUTH DAILY AT SUPPER.   hydrOXYzine (ATARAX/VISTARIL) 25 MG tablet Take 25 mg by mouth 3 (three) times daily as needed. Take 1/2 tablet bid for itching   Lancets Misc. (ACCU-CHEK FASTCLIX LANCET) KIT by Does not apply route. USE LANCET TO CHECK BLOOD SUGAR ONCE DAILY.   loratadine (CLARITIN) 10 MG tablet Take 1 tablet (10 mg total) by mouth 2 (two) times daily as needed for allergies.   omeprazole (PRILOSEC) 20 MG capsule Take 20 mg by mouth daily.   simvastatin (ZOCOR) 20 MG tablet Take 1 tablet (20 mg total) by mouth daily.   SitaGLIPtin-MetFORMIN HCl (JANUMET XR) 50-500 MG TB24 2 tabs with dinner daily   spironolactone (ALDACTONE)  50 MG tablet TAKE 1 TABLET BY MOUTH EVERY DAY   triamcinolone (KENALOG) 0.1 % Apply 1 application topically 2 (two) times daily.   Triamcinolone Acetonide (TRIAMCINOLONE 0.1 % CREAM : EUCERIN) CREA Apply 1 application topically 2 (two) times daily.   No facility-administered encounter medications on file as of 08/14/2022.    Allergies (verified) Aspirin, Beef-derived products, Celecoxib, Milk-related compounds, Mold extract  [trichophyton], Nsaids, Peanuts [peanut oil], Shellfish allergy, Sulfamethoxazole, and Vioxx [rofecoxib]   History: Past Medical History:  Diagnosis Date   Allergy    Anemia    as a young adult   Arthritis    Diabetes mellitus without complication ([place])    Hyperlipidemia    was normal range 05/2017 with PCP   Hypertension    Past Surgical History:  Procedure Laterality Date   ABDOMINAL HYSTERECTOMY  1994   COLONOSCOPY  2008   FLEXIBLE SIGMOIDOSCOPY  2003   POLYPECTOMY     Family History  Problem Relation Age of Onset   Hypertension Sister    Stroke Other    Diabetes Father    Hypertension Father    Stroke Father    Diabetes Sister    Diabetes Brother    Hypertension Brother    Stroke Brother    Cancer Mother    Esophageal cancer Neg Hx    Rectal cancer Neg Hx    Stomach cancer Neg Hx    Colon cancer Neg Hx    Social History   Socioeconomic History   Marital status: Married    Spouse name: Not on file   Number of children: Not on file   Years of education: Not on file   Highest education level: Not on file  Occupational History   Not on file  Tobacco Use   Smoking status: Never   Smokeless tobacco: Never  Vaping Use   Vaping Use: Never used  Substance and Sexual Activity   Alcohol use: No   Drug use: No   Sexual activity: Not on file  Other Topics Concern   Not on file  Social History Narrative   Retired from [employer] work    Separated for 9 years.    Five children, two live locally, three are in [place]   One of her daughters live with her   Has a dog.    She likes to dance and walk   Social Determinants of Health   Financial Resource Strain: Low Risk  (08/14/2022)   Overall Financial Resource Strain (CARDIA)    Difficulty of Paying Living Expenses: Not hard at all  Food Insecurity: No Food Insecurity (08/14/2022)   Hunger Vital Sign    Worried About Running Out of Food in the Last Year: Never true    Ran Out of Food in the Last Year: Never true   Transportation Needs: No Transportation Needs (08/14/2022)   PRAPARE - Hydrologist (Medical): No    Lack of Transportation (Non-Medical): No  Physical Activity: Insufficiently Active (08/14/2022)   Exercise Vital Sign    Days of Exercise per Week: 7 days    Minutes of Exercise per Session: 20 min  Stress: No Stress Concern Present (08/14/2022)   [place]    Feeling of Stress : Not at all  Social Connections: Moderately Integrated (08/14/2022)   Social Connection and Isolation Panel [NHANES]    Frequency of Communication with Friends and Family: More than three times a week  Frequency of Social Gatherings with Friends and Family: More than three times a week    Attends Religious Services: More than 4 times per year    Active Member of [employer] or Organizations: Yes    Attends Music therapist: More than 4 times per year    Marital Status: Divorced    Tobacco Counseling Counseling given: Not Answered   Clinical Intake: Nutrition Risk Assessment:  Has the patient had any N/V/D within the last 2 months?  No  Does the patient have any non-healing wounds?  No  Has the patient had any unintentional weight loss or weight gain?  No   Diabetes:  Is the patient diabetic?  Yes  If diabetic, was a CBG obtained today?  Yes CBG 161 Taken by patient Did the patient bring in their glucometer from home?  No  How often do you monitor your CBG's? 2 -3 times weekly.   Financial Strains and Diabetes Management:  Are you having any financial strains with the device, your supplies or your medication? No .  Does the patient want to be seen by Chronic Care Management for management of their diabetes?  No  Would the patient like to be referred to a Nutritionist or for Diabetic Management?  No   Diabetic Exams:  Diabetic Eye Exam: Completed No. Overdue for diabetic eye exam. Pt has been  advised about the importance in completing this exam. A referral has been placed today. Message sent to referral coordinator for scheduling purposes. Advised pt to expect a call from office referred to regarding appt.  Diabetic Foot Exam: Completed No. Pt has been advised about the importance in completing this exam. Pt is scheduled for diabetic foot exam on Followed by Dr Dwyane Dee.   Pre-visit preparation completed: No  Pain : No/denies pain     BMI - recorded: 29.1 Nutritional Status: BMI 25 -29 Overweight Nutritional Risks: None Diabetes: Yes CBG done?: Yes CBG resulted in Enter/ Edit results?: Yes (CBG 161 Taken by patient) Did pt. bring in CBG monitor from home?: No     Diabetic?  Yes  Interpreter Needed?: No  Information entered by :: Rolene Arbour LPN   Activities of Daily Living    08/14/2022    1:50 PM 09/05/2021   10:16 AM  In your present state of health, do you have any difficulty performing the following activities:  Hearing? 0 0  Vision? 0 0  Difficulty concentrating or making decisions? 0 0  Walking or climbing stairs? 0 0  Dressing or bathing? 0 0  Doing errands, shopping? 0 0  Preparing Food and eating ? N   Using the Toilet? N   In the past six months, have you accidently leaked urine? N   Do you have problems with loss of bowel control? N   Managing your Medications? N   Managing your Finances? N   Housekeeping or managing your Housekeeping? N     Patient Care Team: [name], NP as PCP - General (Family Medicine)  Indicate any recent Medical Services you may have received from other than [facility] providers in the past year (date may be approximate).     Assessment:   This is a routine wellness examination for [place].  Hearing/Vision screen Hearing Screening - Comments:: Denies hearing difficulties   Vision Screening - Comments:: Wears rx glasses - up to date with routine eye exams with  Dr Sabra Heck  Dietary issues and exercise activities  discussed: Current Exercise Habits: Home exercise  routine, Type of exercise: walking, Time (Minutes): 20, Frequency (Times/Week): 7, Weekly Exercise (Minutes/Week): 140, Intensity: Mild, Exercise limited by: None identified   Goals Addressed               This Visit's Progress     Patient stated (pt-stated)        I want to be debt free.       Depression Screen    08/14/2022    1:46 PM 07/05/2022   10:19 AM 07/12/2021   11:35 AM 07/12/2021   11:31 AM 07/03/2021    1:54 PM 07/03/2021    1:51 PM 08/17/2020    7:02 AM  PHQ 2/9 Scores  PHQ - 2 Score 0 0 0 0 0 0 0    Fall Risk    08/14/2022    1:50 PM 09/05/2021   10:16 AM 07/12/2021   11:33 AM 07/03/2021    1:53 PM 08/17/2020    6:59 AM  Fall Risk   Falls in the past year? 1 0 0 0 0  Number falls in past yr: 0 0 0 0 0  Injury with Fall? 0 0 0 0 0  Comment No injury or medical attention needed      Risk for fall due to : No Fall Risks  No Fall Risks No Fall Risks   Follow up Falls prevention discussed  Falls evaluation completed Falls evaluation completed     [place]:  Any stairs in or around the home? Yes  If so, are there any without handrails? No  Home free of loose throw rugs in walkways, pet beds, electrical cords, etc? Yes  Adequate lighting in your home to reduce risk of falls? Yes   ASSISTIVE DEVICES UTILIZED TO PREVENT FALLS:  Life alert? No  Use of a cane, walker or w/c? No Grab bars in the bathroom? No  Shower chair or bench in shower? No  Elevated toilet seat or a handicapped toilet? Yes   TIMED UP AND GO:  Was the test performed? No . Audio Visit   Cognitive Function:        08/14/2022    1:53 PM 06/30/2020    1:31 PM  6CIT Screen  What Year? 0 points 0 points  What month? 0 points 0 points  What time? 0 points 0 points  Count back from 20 0 points 0 points  Months in reverse 0 points 0 points  Repeat phrase 0 points 2 points  Total Score 0 points 2 points     Immunizations Immunization History  Administered Date(s) Administered   Influenza Split 09/03/2012   Pneumococcal Conjugate-13 06/06/2015   Pneumococcal Polysaccharide-23 12/28/2009   Td 11/11/2006    TDAP status: Due, Education has been provided regarding the importance of this vaccine. Advised may receive this vaccine at local pharmacy or Health Dept. Aware to provide a copy of the vaccination record if obtained from local pharmacy or Health Dept. Verbalized acceptance and understanding.  Flu Vaccine status: Declined, Education has been provided regarding the importance of this vaccine but patient still declined. Advised may receive this vaccine at local pharmacy or Health Dept. Aware to provide a copy of the vaccination record if obtained from local pharmacy or Health Dept. Verbalized acceptance and understanding.  Pneumococcal vaccine status: Up to date  Covid-19 vaccine status: Declined, Education has been provided regarding the importance of this vaccine but patient still declined. Advised may receive this vaccine at local pharmacy or  Health Dept.or vaccine clinic. Aware to provide a copy of the vaccination record if obtained from local pharmacy or Health Dept. Verbalized acceptance and understanding.  Qualifies for Shingles Vaccine? Yes   Zostavax completed No   Shingrix Completed?: No.    Education has been provided regarding the importance of this vaccine. Patient has been advised to call insurance company to determine out of pocket expense if they have not yet received this vaccine. Advised may also receive vaccine at local pharmacy or Health Dept. Verbalized acceptance and understanding.  Screening Tests Health Maintenance  Topic Date Due   OPHTHALMOLOGY EXAM  06/21/2021   COVID-19 Vaccine (1) 08/30/2022 (Originally 05/20/1946)   Zoster Vaccines- Shingrix (1 of 2) 11/14/2022 (Originally 11/20/1994)   INFLUENZA VACCINE  02/09/2023 (Originally 06/11/2022)   TETANUS/TDAP   08/15/2023 (Originally 11/11/2016)   HEMOGLOBIN A1C  12/11/2022   Diabetic kidney evaluation - Urine ACR  02/08/2023   FOOT EXAM  02/08/2023   Diabetic kidney evaluation - GFR measurement  06/11/2023   Pneumonia Vaccine 8+ Years old  Completed   DEXA SCAN  Completed   Hepatitis C Screening  Completed   HPV VACCINES  Aged Out   COLONOSCOPY (Pts 45-13yr Insurance coverage will need to be confirmed)  Discontinued    Health Maintenance  Health Maintenance Due  Topic Date Due   OPHTHALMOLOGY EXAM  06/21/2021    Colorectal cancer screening: No longer required.   Mammogram status: No longer required due to Age.  Bone Density status: Completed 10/19/20. Results reflect: Bone density results: OSTEOPOROSIS. Repeat every   years.  Lung Cancer Screening: (Low Dose CT Chest recommended if Age 77-80years, 30 pack-year currently smoking OR have quit w/in 15years.) does not qualify.     Additional Screening:  Hepatitis C Screening: does qualify; Completed 06/17/18  Vision Screening: Recommended annual ophthalmology exams for early detection of glaucoma and other disorders of the eye. Is the patient up to date with their annual eye exam?  Yes  Who is the provider or what is the name of the office in which the patient attends annual eye exams? Dr MSabra HeckIf pt is not established with a provider, would they like to be referred to a provider to establish care? No .   Dental Screening: Recommended annual dental exams for proper oral hygiene  Community Resource Referral / Chronic Care Management:  CRR required this visit?  No   CCM required this visit?  No      Plan:     I have personally reviewed and noted the following in the patient's chart:   Medical and social history Use of alcohol, tobacco or illicit drugs  Current medications and supplements including opioid prescriptions. Patient is not currently taking opioid prescriptions. Functional ability and status Nutritional  status Physical activity Advanced directives List of other physicians Hospitalizations, surgeries, and ER visits in previous 12 months Vitals Screenings to include cognitive, depression, and falls Referrals and appointments  In addition, I have reviewed and discussed with patient certain preventive protocols, quality metrics, and best practice recommendations. A written personalized care plan for preventive services as well as general preventive health recommendations were provided to patient.     BCriselda Peaches LPN   116/11/958  Nurse Notes: None

## 2022-08-16 ENCOUNTER — Other Ambulatory Visit: Payer: Self-pay | Admitting: Adult Health

## 2022-08-16 ENCOUNTER — Other Ambulatory Visit: Payer: Self-pay | Admitting: Endocrinology

## 2022-08-16 DIAGNOSIS — I10 Essential (primary) hypertension: Secondary | ICD-10-CM

## 2022-08-29 ENCOUNTER — Encounter (HOSPITAL_BASED_OUTPATIENT_CLINIC_OR_DEPARTMENT_OTHER): Payer: Self-pay | Admitting: Orthopedic Surgery

## 2022-08-29 ENCOUNTER — Other Ambulatory Visit: Payer: Self-pay

## 2022-09-04 ENCOUNTER — Encounter (HOSPITAL_BASED_OUTPATIENT_CLINIC_OR_DEPARTMENT_OTHER)
Admission: RE | Admit: 2022-09-04 | Discharge: 2022-09-04 | Disposition: A | Discharge status: Home / Self Care | Payer: Medicare Other | Source: Ambulatory Visit | Attending: Orthopedic Surgery | Admitting: Orthopedic Surgery

## 2022-09-04 ENCOUNTER — Other Ambulatory Visit: Payer: Self-pay

## 2022-09-04 DIAGNOSIS — E1165 Type 2 diabetes mellitus with hyperglycemia: Secondary | ICD-10-CM | POA: Diagnosis not present

## 2022-09-04 DIAGNOSIS — E7800 Pure hypercholesterolemia, unspecified: Secondary | ICD-10-CM | POA: Insufficient documentation

## 2022-09-04 DIAGNOSIS — Z01818 Encounter for other preprocedural examination: Secondary | ICD-10-CM | POA: Diagnosis not present

## 2022-09-04 LAB — BASIC METABOLIC PANEL
Anion gap: 13 (ref 5–15)
BUN: 32 mg/dL — ABNORMAL HIGH (ref 8–23)
CO2: 26 mmol/L (ref 22–32)
Calcium: 9.2 mg/dL (ref 8.9–10.3)
Chloride: 101 mmol/L (ref 98–111)
Creatinine, Ser: 1.2 mg/dL — ABNORMAL HIGH (ref 0.44–1.00)
GFR, Estimated: 47 mL/min — ABNORMAL LOW (ref >60)
Glucose, Bld: 141 mg/dL — ABNORMAL HIGH (ref 70–99)
Potassium: 4.8 mmol/L (ref 3.5–5.1)
Sodium: 140 mmol/L (ref 135–145)

## 2022-09-04 NOTE — Progress Notes (Signed)

## 2022-09-05 ENCOUNTER — Other Ambulatory Visit: Payer: Self-pay | Admitting: Internal Medicine

## 2022-09-05 ENCOUNTER — Other Ambulatory Visit: Payer: Self-pay | Admitting: Adult Health

## 2022-09-05 DIAGNOSIS — I10 Essential (primary) hypertension: Secondary | ICD-10-CM

## 2022-09-06 ENCOUNTER — Encounter (HOSPITAL_BASED_OUTPATIENT_CLINIC_OR_DEPARTMENT_OTHER)
Admission: RE | Disposition: A | Discharge status: Home / Self Care | Payer: Self-pay | Source: Home / Self Care | Attending: Orthopedic Surgery

## 2022-09-06 ENCOUNTER — Ambulatory Visit (HOSPITAL_BASED_OUTPATIENT_CLINIC_OR_DEPARTMENT_OTHER): Payer: Medicare Other | Admitting: Anesthesiology

## 2022-09-06 ENCOUNTER — Encounter (HOSPITAL_BASED_OUTPATIENT_CLINIC_OR_DEPARTMENT_OTHER): Payer: Self-pay | Admitting: Orthopedic Surgery

## 2022-09-06 ENCOUNTER — Ambulatory Visit (HOSPITAL_BASED_OUTPATIENT_CLINIC_OR_DEPARTMENT_OTHER)
Admission: RE | Admit: 2022-09-06 | Discharge: 2022-09-06 | Disposition: A | Discharge status: Home / Self Care | Payer: Medicare Other | Attending: Orthopedic Surgery | Admitting: Orthopedic Surgery

## 2022-09-06 DIAGNOSIS — M659 Synovitis and tenosynovitis, unspecified: Secondary | ICD-10-CM | POA: Diagnosis not present

## 2022-09-06 DIAGNOSIS — D1722 Benign lipomatous neoplasm of skin and subcutaneous tissue of left arm: Secondary | ICD-10-CM | POA: Diagnosis not present

## 2022-09-06 DIAGNOSIS — R2232 Localized swelling, mass and lump, left upper limb: Secondary | ICD-10-CM | POA: Insufficient documentation

## 2022-09-06 DIAGNOSIS — I10 Essential (primary) hypertension: Secondary | ICD-10-CM | POA: Diagnosis not present

## 2022-09-06 DIAGNOSIS — E119 Type 2 diabetes mellitus without complications: Secondary | ICD-10-CM | POA: Diagnosis not present

## 2022-09-06 DIAGNOSIS — K219 Gastro-esophageal reflux disease without esophagitis: Secondary | ICD-10-CM | POA: Insufficient documentation

## 2022-09-06 DIAGNOSIS — Z01818 Encounter for other preprocedural examination: Secondary | ICD-10-CM

## 2022-09-06 DIAGNOSIS — M65822 Other synovitis and tenosynovitis, left upper arm: Secondary | ICD-10-CM | POA: Diagnosis not present

## 2022-09-06 DIAGNOSIS — Z7984 Long term (current) use of oral hypoglycemic drugs: Secondary | ICD-10-CM | POA: Diagnosis not present

## 2022-09-06 HISTORY — PX: MASS EXCISION: SHX2000

## 2022-09-06 LAB — GLUCOSE, CAPILLARY
Glucose-Capillary: 140 mg/dL — ABNORMAL HIGH (ref 70–99)
Glucose-Capillary: 151 mg/dL — ABNORMAL HIGH (ref 70–99)

## 2022-09-06 SURGERY — EXCISION MASS
Anesthesia: General | Site: Hand | Laterality: Left

## 2022-09-06 MED ORDER — FENTANYL CITRATE (PF) 100 MCG/2ML IJ SOLN
INTRAMUSCULAR | Status: DC | PRN
  Administered 2022-09-06: 50 ug via INTRAVENOUS

## 2022-09-06 MED ORDER — ONDANSETRON HCL 4 MG/2ML IJ SOLN
INTRAMUSCULAR | Status: AC
  Filled 2022-09-06: qty 2

## 2022-09-06 MED ORDER — FENTANYL CITRATE (PF) 100 MCG/2ML IJ SOLN
25.0000 ug | INTRAMUSCULAR | Status: DC | PRN
  Administered 2022-09-06 (×2): 25 ug via INTRAVENOUS

## 2022-09-06 MED ORDER — DEXAMETHASONE SODIUM PHOSPHATE 10 MG/ML IJ SOLN
INTRAMUSCULAR | Status: DC | PRN
  Administered 2022-09-06: 4 mg via INTRAVENOUS

## 2022-09-06 MED ORDER — CEFAZOLIN SODIUM-DEXTROSE 2-4 GM/100ML-% IV SOLN
INTRAVENOUS | Status: AC
  Filled 2022-09-06: qty 100

## 2022-09-06 MED ORDER — EPHEDRINE 5 MG/ML INJ
INTRAVENOUS | Status: AC
  Filled 2022-09-06: qty 5

## 2022-09-06 MED ORDER — ATROPINE SULFATE 0.4 MG/ML IV SOLN
INTRAVENOUS | Status: AC
  Filled 2022-09-06: qty 1

## 2022-09-06 MED ORDER — TRAMADOL HCL 50 MG PO TABS
ORAL_TABLET | ORAL | Status: AC
  Filled 2022-09-06: qty 1

## 2022-09-06 MED ORDER — PROPOFOL 500 MG/50ML IV EMUL
INTRAVENOUS | Status: AC
  Filled 2022-09-06: qty 50

## 2022-09-06 MED ORDER — PROPOFOL 500 MG/50ML IV EMUL
INTRAVENOUS | Status: DC | PRN
  Administered 2022-09-06: 250 ug/kg/min via INTRAVENOUS

## 2022-09-06 MED ORDER — FENTANYL CITRATE (PF) 100 MCG/2ML IJ SOLN
INTRAMUSCULAR | Status: AC
  Filled 2022-09-06: qty 2

## 2022-09-06 MED ORDER — PROPOFOL 10 MG/ML IV BOLUS
INTRAVENOUS | Status: DC | PRN
  Administered 2022-09-06: 80 mg via INTRAVENOUS

## 2022-09-06 MED ORDER — LIDOCAINE 2% (20 MG/ML) 5 ML SYRINGE
INTRAMUSCULAR | Status: AC
  Filled 2022-09-06: qty 5

## 2022-09-06 MED ORDER — SUCCINYLCHOLINE CHLORIDE 200 MG/10ML IV SOSY
PREFILLED_SYRINGE | INTRAVENOUS | Status: AC
  Filled 2022-09-06: qty 10

## 2022-09-06 MED ORDER — LACTATED RINGERS IV SOLN: INTRAVENOUS | Status: DC

## 2022-09-06 MED ORDER — CEFAZOLIN SODIUM-DEXTROSE 2-4 GM/100ML-% IV SOLN
2.0000 g | INTRAVENOUS | Status: AC
  Administered 2022-09-06: 2 g via INTRAVENOUS

## 2022-09-06 MED ORDER — LIDOCAINE HCL (CARDIAC) PF 100 MG/5ML IV SOSY
PREFILLED_SYRINGE | INTRAVENOUS | Status: DC | PRN
  Administered 2022-09-06: 40 mg via INTRAVENOUS

## 2022-09-06 MED ORDER — MIDAZOLAM HCL 2 MG/2ML IJ SOLN
INTRAMUSCULAR | Status: AC
  Filled 2022-09-06: qty 2

## 2022-09-06 MED ORDER — ONDANSETRON HCL 4 MG/2ML IJ SOLN
INTRAMUSCULAR | Status: DC | PRN
  Administered 2022-09-06: 4 mg via INTRAVENOUS

## 2022-09-06 MED ORDER — ACETAMINOPHEN 500 MG PO TABS
ORAL_TABLET | ORAL | Status: AC
  Filled 2022-09-06: qty 2

## 2022-09-06 MED ORDER — ACETAMINOPHEN 500 MG PO TABS
1000.0000 mg | ORAL_TABLET | Freq: Once | ORAL | Status: AC
  Administered 2022-09-06: 1000 mg via ORAL

## 2022-09-06 MED ORDER — PHENYLEPHRINE 80 MCG/ML (10ML) SYRINGE FOR IV PUSH (FOR BLOOD PRESSURE SUPPORT)
PREFILLED_SYRINGE | INTRAVENOUS | Status: AC
  Filled 2022-09-06: qty 10

## 2022-09-06 MED ORDER — TRAMADOL HCL 50 MG PO TABS: ORAL_TABLET | ORAL | 0 refills | Status: DC

## 2022-09-06 MED ORDER — DEXAMETHASONE SODIUM PHOSPHATE 10 MG/ML IJ SOLN
INTRAMUSCULAR | Status: AC
  Filled 2022-09-06: qty 1

## 2022-09-06 MED ORDER — TRAMADOL HCL 50 MG PO TABS
50.0000 mg | ORAL_TABLET | Freq: Once | ORAL | Status: AC
  Administered 2022-09-06: 50 mg via ORAL

## 2022-09-06 MED ORDER — BUPIVACAINE HCL (PF) 0.25 % IJ SOLN
INTRAMUSCULAR | Status: DC | PRN
  Administered 2022-09-06: 9 mL

## 2022-09-06 SURGICAL SUPPLY — 56 items
APL PRP STRL LF DISP 70% ISPRP (MISCELLANEOUS) ×1
APL SKNCLS STERI-STRIP NONHPOA (GAUZE/BANDAGES/DRESSINGS)
BANDAGE GAUZE 1X75IN STRL (MISCELLANEOUS) IMPLANT
BENZOIN TINCTURE PRP APPL 2/3 (GAUZE/BANDAGES/DRESSINGS) IMPLANT
BLADE MINI RND TIP GREEN BEAV (BLADE) IMPLANT
BLADE SURG 15 STRL LF DISP TIS (BLADE) ×2 IMPLANT
BLADE SURG 15 STRL SS (BLADE) ×3
BNDG CMPR 5X2 CHSV 1 LYR STRL (GAUZE/BANDAGES/DRESSINGS)
BNDG CMPR 75X11 PLY HI ABS (MISCELLANEOUS)
BNDG CMPR 75X21 PLY HI ABS (MISCELLANEOUS)
BNDG CMPR 9X4 STRL LF SNTH (GAUZE/BANDAGES/DRESSINGS) ×1
BNDG COHESIVE 1X5 TAN STRL LF (GAUZE/BANDAGES/DRESSINGS) IMPLANT
BNDG COHESIVE 2X5 TAN ST LF (GAUZE/BANDAGES/DRESSINGS) IMPLANT
BNDG ELASTIC 2X5.8 VLCR STR LF (GAUZE/BANDAGES/DRESSINGS) IMPLANT
BNDG ELASTIC 3X5.8 VLCR STR LF (GAUZE/BANDAGES/DRESSINGS) IMPLANT
BNDG ESMARK 4X9 LF (GAUZE/BANDAGES/DRESSINGS) IMPLANT
BNDG GAUZE 1X75IN STRL (MISCELLANEOUS)
BNDG GAUZE DERMACEA FLUFF 4 (GAUZE/BANDAGES/DRESSINGS) IMPLANT
BNDG GZE DERMACEA 4 6PLY (GAUZE/BANDAGES/DRESSINGS) ×1
BNDG PLASTER X FAST 3X3 WHT LF (CAST SUPPLIES) IMPLANT
BNDG PLSTR 9X3 FST ST WHT (CAST SUPPLIES)
CHLORAPREP W/TINT 26 (MISCELLANEOUS) ×1 IMPLANT
CORD BIPOLAR FORCEPS 12FT (ELECTRODE) ×1 IMPLANT
COVER BACK TABLE 60X90IN (DRAPES) ×1 IMPLANT
COVER MAYO STAND STRL (DRAPES) ×1 IMPLANT
CUFF TOURN SGL QUICK 18X4 (TOURNIQUET CUFF) ×1 IMPLANT
DRAPE EXTREMITY T 121X128X90 (DISPOSABLE) ×1 IMPLANT
DRAPE SURG 17X23 STRL (DRAPES) ×1 IMPLANT
GAUZE SPONGE 4X4 12PLY STRL (GAUZE/BANDAGES/DRESSINGS) ×1 IMPLANT
GAUZE STRETCH 2X75IN STRL (MISCELLANEOUS) IMPLANT
GAUZE XEROFORM 1X8 LF (GAUZE/BANDAGES/DRESSINGS) ×1 IMPLANT
GLOVE BIO SURGEON STRL SZ7.5 (GLOVE) ×1 IMPLANT
GLOVE BIOGEL PI IND STRL 8 (GLOVE) ×1 IMPLANT
GOWN STRL REUS W/ TWL LRG LVL3 (GOWN DISPOSABLE) ×1 IMPLANT
GOWN STRL REUS W/TWL LRG LVL3 (GOWN DISPOSABLE) ×1
GOWN STRL REUS W/TWL XL LVL3 (GOWN DISPOSABLE) ×1 IMPLANT
NDL HYPO 25X1 1.5 SAFETY (NEEDLE) ×1 IMPLANT
NEEDLE HYPO 25X1 1.5 SAFETY (NEEDLE) ×1 IMPLANT
NS IRRIG 1000ML POUR BTL (IV SOLUTION) ×1 IMPLANT
PACK BASIN DAY SURGERY FS (CUSTOM PROCEDURE TRAY) ×1 IMPLANT
PAD CAST 3X4 CTTN HI CHSV (CAST SUPPLIES) IMPLANT
PAD CAST 4YDX4 CTTN HI CHSV (CAST SUPPLIES) IMPLANT
PADDING CAST ABS COTTON 4X4 ST (CAST SUPPLIES) ×1 IMPLANT
PADDING CAST COTTON 3X4 STRL (CAST SUPPLIES)
PADDING CAST COTTON 4X4 STRL (CAST SUPPLIES)
STOCKINETTE 4X48 STRL (DRAPES) ×1 IMPLANT
STRIP CLOSURE SKIN 1/2X4 (GAUZE/BANDAGES/DRESSINGS) IMPLANT
SUT ETHILON 3 0 PS 1 (SUTURE) IMPLANT
SUT ETHILON 4 0 PS 2 18 (SUTURE) ×1 IMPLANT
SUT ETHILON 5 0 P 3 18 (SUTURE)
SUT NYLON ETHILON 5-0 P-3 1X18 (SUTURE) IMPLANT
SUT VIC AB 4-0 P2 18 (SUTURE) IMPLANT
SYR BULB EAR ULCER 3OZ GRN STR (SYRINGE) ×1 IMPLANT
SYR CONTROL 10ML LL (SYRINGE) ×1 IMPLANT
TOWEL GREEN STERILE FF (TOWEL DISPOSABLE) ×2 IMPLANT
UNDERPAD 30X36 HEAVY ABSORB (UNDERPADS AND DIAPERS) ×1 IMPLANT

## 2022-09-06 NOTE — Discharge Instructions (Addendum)
[facility] Instructions Hand Surgery  Wound Care: Keep your hand elevated above the level of your heart.  Do not allow it to dangle by your side.  Keep the dressing dry and do not remove it unless your doctor advises you to do so.  He will usually change it at the time of your post-op visit.  Moving your fingers is advised to stimulate circulation but will depend on the site of your surgery.  If you have a splint applied, your doctor will advise you regarding movement.  Activity: Do not drive or operate machinery today.  Rest today and then you may return to your normal activity and work as indicated by your physician.  Diet:  Drink liquids today or eat a light diet.  You may resume a regular diet tomorrow.    General expectations: Pain for two to three days. Fingers may become slightly swollen.  Call your doctor if any of the following occur: Severe pain not relieved by pain medication. Elevated temperature. Dressing soaked with blood. Inability to move fingers. White or bluish color to fingers.   Post Anesthesia Home Care Instructions  Activity: Get plenty of rest for the remainder of the day. A responsible individual must stay with you for 24 hours following the procedure.  For the next 24 hours, DO NOT: -Drive a car -Paediatric nurse -Drink alcoholic beverages -Take any medication unless instructed by your physician -Make any legal decisions or sign important papers.  Meals: Start with liquid foods such as gelatin or soup. Progress to regular foods as tolerated. Avoid greasy, spicy, heavy foods. If nausea and/or vomiting occur, drink only clear liquids until the nausea and/or vomiting subsides. Call your physician if vomiting continues.  Special Instructions/Symptoms: Your throat may feel dry or sore from the anesthesia or the breathing tube placed in your throat during surgery. If this causes discomfort, gargle with warm salt water. The discomfort should disappear within  24 hours.  If you had a scopolamine patch placed behind your ear for the management of post- operative nausea and/or vomiting:  1. The medication in the patch is effective for 72 hours, after which it should be removed.  Wrap patch in a tissue and discard in the trash. Wash hands thoroughly with soap and water. 2. You may remove the patch earlier than 72 hours if you experience unpleasant side effects which may include dry mouth, dizziness or visual disturbances. 3. Avoid touching the patch. Wash your hands with soap and water after contact with the patch.  **Tylenol given at 1116. Wait 4 hours before taking again.

## 2022-09-06 NOTE — Transfer of Care (Signed)
Immediate Anesthesia Transfer of Care Note  Patient: CHUNDRA SAUERWEIN  Procedure(s) Performed: EXCISION MASS LEFT HAND (Left: Hand)  Patient Location: PACU  Anesthesia Type:General  Level of Consciousness: sedated  Airway & Oxygen Therapy: Patient Spontanous Breathing and Patient connected to face mask oxygen  Post-op Assessment: Report given to RN and Post -op Vital signs reviewed and stable  Post vital signs: Reviewed and stable  Last Vitals:  Vitals Value Taken Time  BP    Temp    Pulse    Resp    SpO2      Last Pain:  Vitals:   09/06/22 1113  TempSrc: Oral         Complications: No notable events documented.

## 2022-09-06 NOTE — Anesthesia Procedure Notes (Signed)
Procedure Name: Intubation Date/Time: 09/06/2022 1:22 PM  Performed by: Willa Frater, CRNAPre-anesthesia Checklist: Patient identified, Emergency Drugs available, Suction available and Patient being monitored Patient Re-evaluated:Patient Re-evaluated prior to induction Oxygen Delivery Method: Circle system utilized Preoxygenation: Pre-oxygenation with 100% oxygen Induction Type: IV induction Ventilation: Mask ventilation without difficulty LMA Size: 4.0 Tube type: Oral Number of attempts: 1 Airway Equipment and Method: Stylet and Oral airway Placement Confirmation: ETT inserted through vocal cords under direct vision, positive ETCO2 and breath sounds checked- equal and bilateral Tube secured with: Tape Dental Injury: Teeth and Oropharynx as per pre-operative assessment

## 2022-09-06 NOTE — Anesthesia Postprocedure Evaluation (Signed)
Anesthesia Post Note  Patient: Pam Obrien  Procedure(s) Performed: EXCISION MASS LEFT HAND (Left: Hand)     Patient location during evaluation: PACU Anesthesia Type: General Level of consciousness: awake and alert Pain management: pain level controlled Vital Signs Assessment: post-procedure vital signs reviewed and stable Respiratory status: spontaneous breathing, nonlabored ventilation and respiratory function stable Cardiovascular status: blood pressure returned to baseline and stable Postop Assessment: no apparent nausea or vomiting Anesthetic complications: no   No notable events documented.  Last Vitals:  Vitals:   09/06/22 1500 09/06/22 1545  BP: (!) 140/72 133/78  Pulse: (!) 54 (!) 56  Resp: (!) 8 16  Temp:  36.6 C  SpO2: 97% 95%    Last Pain:  Vitals:   09/06/22 1545  TempSrc: Oral  PainSc: 2                  Terriana Barreras,W. EDMOND

## 2022-09-06 NOTE — Anesthesia Preprocedure Evaluation (Addendum)
Anesthesia Evaluation  Patient identified by MRN, date of birth, ID band Patient awake    Reviewed: Allergy & Precautions, H&P , NPO status , Patient's Chart, lab work & pertinent test results, reviewed documented beta blocker date and time   Airway Mallampati: II  TM Distance: >3 FB Neck ROM: Full    Dental no notable dental hx. (+) Edentulous Upper, Dental Advisory Given   Pulmonary neg pulmonary ROS,    Pulmonary exam normal breath sounds clear to auscultation       Cardiovascular hypertension, Pt. on medications and Pt. on home beta blockers  Rhythm:Regular Rate:Normal     Neuro/Psych negative neurological ROS  negative psych ROS   GI/Hepatic Neg liver ROS, GERD  Medicated,  Endo/Other  diabetes, Type 2, Oral Hypoglycemic Agents  Renal/GU negative Renal ROS  negative genitourinary   Musculoskeletal  (+) Arthritis , Osteoarthritis,    Abdominal   Peds  Hematology  (+) Blood dyscrasia, anemia ,   Anesthesia Other Findings   Reproductive/Obstetrics negative OB ROS                            Anesthesia Physical Anesthesia Plan  ASA: 2  Anesthesia Plan: General   Post-op Pain Management: Tylenol PO (pre-op)*   Induction: Intravenous  PONV Risk Score and Plan: 4 or greater and Ondansetron, Treatment may vary due to age or medical condition, Propofol infusion and TIVA  Airway Management Planned: LMA  Additional Equipment:   Intra-op Plan:   Post-operative Plan: Extubation in OR  Informed Consent: I have reviewed the patients History and Physical, chart, labs and discussed the procedure including the risks, benefits and alternatives for the proposed anesthesia with the patient or authorized representative who has indicated his/her understanding and acceptance.     Dental advisory given  Plan Discussed with: CRNA  Anesthesia Plan Comments:        Anesthesia Quick  Evaluation

## 2022-09-06 NOTE — Op Note (Signed)
NAME: DMYA LONG MEDICAL RECORD NO: [id number] DATE OF BIRTH: 06-Sep-1946 FACILITY: [facility] LOCATION: [facility] PHYSICIAN: Tennis Must, MD   OPERATIVE REPORT   DATE OF PROCEDURE: 09/06/22    PREOPERATIVE DIAGNOSIS: Left dorsal hand mass x2   POSTOPERATIVE DIAGNOSIS: Left fourth dorsal compartment extensor tenosynovitis, left dorsal radial hand mass   PROCEDURE:  1.  Left fourth dorsal compartment tenosynovectomy 2.  Left dorsal subcutaneous hand mass excision via separate incision, greater than 1.5 cm   SURGEON:  Leanora Cover, M.D.   ASSISTANT: none   ANESTHESIA:  General   INTRAVENOUS FLUIDS:  Per anesthesia flow sheet.   ESTIMATED BLOOD LOSS:  Minimal.   COMPLICATIONS:  None.   SPECIMENS: Left fourth dorsal compartment tenosynovitis and left dorsal hand mass to pathology   TOURNIQUET TIME:   Less than 30 minutes at 250 mmHg   DISPOSITION:  Stable to PACU.   INDICATIONS: 76 year old female who is noted a mass at the dorsum of her left hand.  This is bothersome to her.  She wishes to have it removed.  We discussed possible tenosynovitis requiring a second incision for debridement.  She also has noted an additional mass at the dorsal radial aspect of the hand that is bothersome and she wishes to have removed.  Risks, benefits and alternatives of surgery were discussed including the risks of blood loss, infection, damage to nerves, vessels, tendons, ligaments, bone for surgery, need for additional surgery, complications with wound healing, continued pain, stiffness, , recurrence.  She voiced understanding of these risks and elected to proceed.  OPERATIVE COURSE:  After being identified preoperatively by myself,  the patient and I agreed on the procedure and site of the procedure.  The surgical site was marked.  Surgical consent had been signed. Preoperative IV antibiotic prophylaxis was given. She was transferred to the operating room and placed on the  operating table in supine position with the Left upper extremity on an arm board.  General anesthesia was induced by the anesthesiologist.  Left upper extremity was prepped and draped in normal sterile orthopedic fashion.  A surgical pause was performed between the surgeons, anesthesia, and operating room staff and all were in agreement as to the patient, procedure, and site of procedure.  Tourniquet at the proximal aspect of the extremity was inflated to 250 mmHg after exsanguination of the arm with an Esmarch bandage.  Longitudinal incision was made over the mass at the dorsum of the hand over the fourth dorsal compartment extensor tendons.  This was carried into subcutaneous tissues by spreading technique.  Care was taken to protect cutaneous branches of nerve.  The mass was identified.  It appeared to be tenosynovitis.  This was carefully removed from the extensor tendons of the fourth dorsal compartment and down into the fourth dorsal compartment underneath the extensor retinaculum.  An additional incision was then made over the dorsal distal forearm.  The fourth dorsal compartment tendons were visualized.  There was some tenosynovitis but not as much.  This was carefully removed as well.  Fingers were placed through range of motion and no remaining tenosynovium was noted underneath the extensor retinaculum.  The tenosynovium was sent to pathology for examination.  A separate incision was made over the mass at the dorsal radial aspect of the wrist.  This was carried in subcutaneous tissues by spreading technique.  Again care was taken to protect cutaneous branches of nerve.  Bipolar electrocautery was used to obtain hemostasis.  The mass was ill-defined.  It was fatty in nature.  It was carefully freed up and sent to pathology for examination.  It was greater than 1.5 cm in size.  The wounds were all copiously irrigated with sterile saline and closed with 4-0 nylon in a horizontal mattress fashion.  They were  injected with quarter percent plain Marcaine to aid in postoperative analgesia.  They were dressed with sterile Xeroform 4 x 4's and ABD used as a splint.  This was all wrapped with Kerlix and Ace bandage.  The tourniquet was deflated at less than 30 minutes.  Fingertips were pink with brisk capillary refill after deflation of tourniquet.  The operative  drapes were broken down.  The patient was awoken from anesthesia safely.  She was transferred back to the stretcher and taken to PACU in stable condition.  I will see her back in the office in 1 week for postoperative followup.  I will give her a prescription for Tramadol 50 mg 1 tab PO q6 hours prn pain, dispense # 20.   Leanora Cover, MD Electronically signed, 09/06/22

## 2022-09-06 NOTE — H&P (Signed)
Pam Obrien is an 76 y.o. female.   Chief Complaint: left hand mass HPI: 76 yo female with mass on dorsum left hand.  It is bothersome to her.  She wishes to have it removed.  Has also noted additional mass more radially on dorsum of hand that she is interested in having removed as well.  Allergies:  Allergies  Allergen Reactions   Aspirin     REACTION: nervous   Beef-Derived Products     Sneezing, nasal drainage, throat scratchy   Celecoxib     REACTION: joints swell   Milk-Related Compounds     itching   Mold Extract [Trichophyton]     Pt does not know the reaction.  Positive allergy test per pt.   Nsaids     REACTION: sweling   Peanuts [Peanut Oil]     Throat feels scratchy   Shellfish Allergy     Nasal congestion, throat scratchy, eyes watery   Sulfamethoxazole     REACTION: urticaria (hives)   Vioxx [Rofecoxib] Swelling    Past Medical History:  Diagnosis Date   Allergy    Anemia    as a young adult   Arthritis    Diabetes mellitus without complication ([place])    Hyperlipidemia    was normal range 05/2017 with PCP   Hypertension     Past Surgical History:  Procedure Laterality Date   ABDOMINAL HYSTERECTOMY  1994   COLONOSCOPY  2008   FLEXIBLE SIGMOIDOSCOPY  2003   POLYPECTOMY      Family History: Family History  Problem Relation Age of Onset   Hypertension Sister    Stroke Other    Diabetes Father    Hypertension Father    Stroke Father    Diabetes Sister    Diabetes Brother    Hypertension Brother    Stroke Brother    Cancer Mother    Esophageal cancer Neg Hx    Rectal cancer Neg Hx    Stomach cancer Neg Hx    Colon cancer Neg Hx     Social History:   reports that she has never smoked. She has never used smokeless tobacco. She reports that she does not drink alcohol and does not use drugs.  Medications: Medications Prior to Admission  Medication Sig Dispense Refill   acetaminophen (TYLENOL) 500 MG tablet Take 1 tablet (500 mg total)  by mouth every 6 (six) hours as needed. 30 tablet 0   atenolol-chlorthalidone (TENORETIC) 50-25 MG tablet TAKE 1/2 TABLET BY MOUTH EVERY DAY 45 tablet 2   calcium citrate-vitamin D (CITRACAL+D) 315-200 MG-UNIT per tablet Take 1 tablet by mouth daily.     cholecalciferol (VITAMIN D) 1000 UNITS tablet Take 2,000 Units by mouth daily.     FeFum-FePo-FA-B Cmp-C-Zn-Mn-Cu (SE-TAN PLUS) 162-115.2-1 MG CAPS TAKE 1 CAPSULE BY MOUTH EVERY DAY 90 capsule 3   glimepiride (AMARYL) 4 MG tablet TAKE 1 TABLET BY MOUTH DAILY AT SUPPER. 90 tablet 1   loratadine (CLARITIN) 10 MG tablet Take 1 tablet (10 mg total) by mouth 2 (two) times daily as needed for allergies. 180 tablet 3   metFORMIN (GLUCOPHAGE) 500 MG tablet Take 500 mg by mouth 2 (two) times daily with a meal.     omeprazole (PRILOSEC) 20 MG capsule Take 20 mg by mouth daily.     spironolactone (ALDACTONE) 50 MG tablet TAKE 1 TABLET BY MOUTH EVERY DAY 90 tablet 0   ACCU-CHEK GUIDE test strip USE AS INSTRUCTED TO CHECK BLOOD SUGAR  ONCE DAILY. 100 strip 2   Accu-Chek Softclix Lancets lancets USE AS DIRECTED EVERY DAY 100 each 1   Blood Glucose Monitoring Suppl (ACCU-CHEK AVIVA PLUS) w/Device KIT Use Accu Chek Aviva Plus to check blood sugar once daily. 1 kit 0   clotrimazole-betamethasone (LOTRISONE) cream Apply 1 application. topically 2 (two) times daily.     hydrOXYzine (ATARAX/VISTARIL) 25 MG tablet Take 25 mg by mouth 3 (three) times daily as needed. Take 1/2 tablet bid for itching     Lancets Misc. (ACCU-CHEK FASTCLIX LANCET) KIT by Does not apply route. USE LANCET TO CHECK BLOOD SUGAR ONCE DAILY.     simvastatin (ZOCOR) 20 MG tablet Take 1 tablet (20 mg total) by mouth daily. 90 tablet 1   SitaGLIPtin-MetFORMIN HCl (JANUMET XR) 50-500 MG TB24 2 tabs with dinner daily 60 tablet 1   triamcinolone (KENALOG) 0.1 % Apply 1 application topically 2 (two) times daily. 45 g 0   Triamcinolone Acetonide (TRIAMCINOLONE 0.1 % CREAM : EUCERIN) CREA Apply 1  application topically 2 (two) times daily. 1 each 0    Results for orders placed or performed during the hospital encounter of 09/06/22 (from the past 48 hour(s))  Glucose, capillary     Status: Abnormal   Collection Time: 09/06/22 11:13 AM  Result Value Ref Range   Glucose-Capillary 140 (H) 70 - 99 mg/dL    Comment: Glucose reference range applies only to samples taken after fasting for at least 8 hours.    No results found.    Blood pressure 127/69, pulse 69, temperature 97.7 F (36.5 C), temperature source Oral, resp. rate 20, height _0  (1.549 m), weight 71.5 kg, SpO2 100 %.  General appearance: alert, cooperative, and appears stated age Head: Normocephalic, without obvious abnormality, atraumatic Neck: supple, symmetrical, trachea midline Extremities: Intact sensation and capillary refill all digits.  +epl/fpl/io.  No wounds. Mass on dorsum of hand centrally and another dorsally at radial side of wrist. Pulses: 2+ and symmetric Skin: Skin color, texture, turgor normal. No rashes or lesions Neurologic: Grossly normal Incision/Wound: none  Assessment/Plan Left hand masses.  She wishes to have these removed.  Discussed that this can involve multiple incisions to remove each mass and if it is tenosynovitis for more proximal excision.  Non operative and operative treatment options have been discussed with the patient and patient wishes to proceed with operative treatment. Risks, benefits, and alternatives of surgery have been discussed and the patient agrees with the plan of care.   Pam Obrien 09/06/2022, 1:01 PM

## 2022-09-09 ENCOUNTER — Encounter (HOSPITAL_BASED_OUTPATIENT_CLINIC_OR_DEPARTMENT_OTHER): Payer: Self-pay | Admitting: Orthopedic Surgery

## 2022-09-09 LAB — SURGICAL PATHOLOGY

## 2022-09-13 ENCOUNTER — Encounter: Payer: Medicare Other | Admitting: Adult Health

## 2022-10-08 ENCOUNTER — Other Ambulatory Visit: Payer: Self-pay | Admitting: Endocrinology

## 2022-10-15 ENCOUNTER — Other Ambulatory Visit: Payer: Medicare Other

## 2022-10-17 ENCOUNTER — Ambulatory Visit: Payer: Medicare Other | Admitting: Endocrinology

## 2022-10-18 ENCOUNTER — Ambulatory Visit: Payer: Medicare Other | Admitting: Endocrinology

## 2022-10-18 DIAGNOSIS — M25532 Pain in left wrist: Secondary | ICD-10-CM | POA: Diagnosis not present

## 2022-10-18 DIAGNOSIS — M25632 Stiffness of left wrist, not elsewhere classified: Secondary | ICD-10-CM | POA: Diagnosis not present

## 2022-10-18 DIAGNOSIS — R29898 Other symptoms and signs involving the musculoskeletal system: Secondary | ICD-10-CM | POA: Diagnosis not present

## 2022-10-18 DIAGNOSIS — M65832 Other synovitis and tenosynovitis, left forearm: Secondary | ICD-10-CM | POA: Diagnosis not present

## 2022-11-08 ENCOUNTER — Encounter: Payer: Medicare Other | Admitting: Adult Health

## 2022-11-11 ENCOUNTER — Other Ambulatory Visit: Payer: Self-pay

## 2022-11-11 ENCOUNTER — Encounter: Payer: Self-pay | Admitting: Emergency Medicine

## 2022-11-11 ENCOUNTER — Telehealth: Payer: Self-pay | Admitting: Emergency Medicine

## 2022-11-11 ENCOUNTER — Ambulatory Visit
Admission: EM | Admit: 2022-11-11 | Discharge: 2022-11-11 | Disposition: A | Discharge status: Home / Self Care | Payer: Medicare Other | Attending: Physician Assistant | Admitting: Physician Assistant

## 2022-11-11 DIAGNOSIS — L299 Pruritus, unspecified: Secondary | ICD-10-CM | POA: Diagnosis not present

## 2022-11-11 MED ORDER — CLOTRIMAZOLE-BETAMETHASONE 1-0.05 % EX CREA: TOPICAL_CREAM | CUTANEOUS | 0 refills | Status: DC

## 2022-11-11 MED ORDER — CLOTRIMAZOLE-BETAMETHASONE 1-0.05 % EX CREA: TOPICAL_CREAM | CUTANEOUS | 0 refills | Status: AC

## 2022-11-11 NOTE — ED Provider Notes (Signed)
[name]    CSN: [id number] Arrival date & time: 11/11/22  1516      History   Chief Complaint Chief Complaint  Patient presents with   Foot Pain    HPI Pam Obrien is a 77 y.o. female.   Patient here today for evaluation of itching and pain to her left foot that started 2 days ago. She has not had rash. She denies any known trigger. She does not report  any shortness of breath or trouble swallowing. She has tried ice without significant relief. She denies any known injury. She has not had any numbness or tingling. She reports symptoms are intermittent. She has been treated for left foot itching several times in the past on chart review.  The history is provided by the patient.  Foot Pain Pertinent negatives include no abdominal pain.    Past Medical History:  Diagnosis Date   Allergy    Anemia    as a young adult   Arthritis    Diabetes mellitus without complication ([place])    Hyperlipidemia    was normal range 05/2017 with PCP   Hypertension     Patient Active Problem List   Diagnosis Date Noted   Hyperlipidemia 07/21/2014   Type II diabetes mellitus, uncontrolled 01/04/2013   ESOPHAGEAL REFLUX 06/18/2010   HYPOKALEMIA, MILD 06/02/2009   Disorder of bone and cartilage 12/11/2007   COLONIC POLYPS, HX OF 12/11/2007   Essential hypertension 06/23/2007   ALLERGIC RHINITIS 06/23/2007   OSTEOARTHRITIS 06/23/2007    Past Surgical History:  Procedure Laterality Date   ABDOMINAL HYSTERECTOMY  1994   COLONOSCOPY  2008   FLEXIBLE SIGMOIDOSCOPY  2003   MASS EXCISION Left 09/06/2022   Procedure: EXCISION MASS LEFT HAND;  Surgeon: Leanora Cover, MD;  Location: [facility];  Service: Orthopedics;  Laterality: Left;   POLYPECTOMY      OB History   No obstetric history on file.      Home Medications    Prior to Admission medications   Medication Sig Start Date End Date Taking? Authorizing Provider  clotrimazole-betamethasone  (LOTRISONE) cream Apply to affected area 2 times daily prn 11/11/22  Yes Francene Finders, PA-C  ACCU-CHEK GUIDE test strip USE AS INSTRUCTED TO CHECK BLOOD SUGAR ONCE DAILY. 09/05/22   Elayne Snare, MD  Accu-Chek Softclix Lancets lancets USE AS DIRECTED EVERY DAY 12/31/21   Elayne Snare, MD  acetaminophen (TYLENOL) 500 MG tablet Take 1 tablet (500 mg total) by mouth every 6 (six) hours as needed. 07/30/18   Zigmund Gottron, NP  atenolol-chlorthalidone (TENORETIC) 50-25 MG tablet TAKE 1/2 TABLET BY MOUTH EVERY DAY 09/05/22   Nafziger, [name], NP  Blood Glucose Monitoring Suppl (ACCU-CHEK AVIVA PLUS) w/Device KIT Use Accu Chek Aviva Plus to check blood sugar once daily. 12/07/19   Elayne Snare, MD  calcium citrate-vitamin D (CITRACAL+D) 315-200 MG-UNIT per tablet Take 1 tablet by mouth daily.    [provider]  cholecalciferol (VITAMIN D) 1000 UNITS tablet Take 2,000 Units by mouth daily.    [provider]  FeFum-FePo-FA-B Cmp-C-Zn-Mn-Cu (SE-TAN PLUS) 162-115.2-1 MG CAPS TAKE 1 CAPSULE BY MOUTH EVERY DAY 01/18/22   Nafziger, [name], NP  glimepiride (AMARYL) 4 MG tablet TAKE 1 TABLET BY MOUTH EVERY DAY AT SUPPER 10/08/22   Elayne Snare, MD  hydrOXYzine (ATARAX/VISTARIL) 25 MG tablet Take 25 mg by mouth 3 (three) times daily as needed. Take 1/2 tablet bid for itching    [provider]  Lancets Misc. (ACCU-CHEK FASTCLIX LANCET) KIT by Does not apply route. USE LANCET TO CHECK BLOOD SUGAR ONCE DAILY.    [provider]  loratadine (CLARITIN) 10 MG tablet Take 1 tablet (10 mg total) by mouth 2 (two) times daily as needed for allergies. 03/14/14   Ricard Dillon, MD  metFORMIN (GLUCOPHAGE) 500 MG tablet Take 500 mg by mouth 2 (two) times daily with a meal.    [provider]  omeprazole (PRILOSEC) 20 MG capsule Take 20 mg by mouth daily.    [provider]  simvastatin (ZOCOR) 20 MG tablet Take 1 tablet (20 mg total) by mouth daily. 06/13/22   Elayne Snare, MD   SitaGLIPtin-MetFORMIN HCl (JANUMET XR) 50-500 MG TB24 2 tabs with dinner daily 06/13/22   Elayne Snare, MD  spironolactone (ALDACTONE) 50 MG tablet TAKE 1 TABLET BY MOUTH EVERY DAY 08/16/22   Elayne Snare, MD  traMADol Veatrice Bourbon) 50 MG tablet 1 tab PO q6 hours prn pain 09/06/22   Leanora Cover, MD  triamcinolone (KENALOG) 0.1 % Apply 1 application topically 2 (two) times daily. 01/27/21   Wieters, Hallie C, PA-C  Triamcinolone Acetonide (TRIAMCINOLONE 0.1 % CREAM : EUCERIN) CREA Apply 1 application topically 2 (two) times daily. 01/27/21   Wieters, Elesa Hacker, PA-C    Family History Family History  Problem Relation Age of Onset   Hypertension Sister    Stroke Other    Diabetes Father    Hypertension Father    Stroke Father    Diabetes Sister    Diabetes Brother    Hypertension Brother    Stroke Brother    Cancer Mother    Esophageal cancer Neg Hx    Rectal cancer Neg Hx    Stomach cancer Neg Hx    Colon cancer Neg Hx     Social History Social History   Tobacco Use   Smoking status: Never   Smokeless tobacco: Never  Vaping Use   Vaping Use: Never used  Substance Use Topics   Alcohol use: No   Drug use: No     Allergies   Aspirin, Beef-derived products, Celecoxib, Milk-related compounds, Mold extract [trichophyton], Nsaids, Peanuts [peanut oil], Shellfish allergy, Sulfamethoxazole, and Vioxx [rofecoxib]   Review of Systems Review of Systems  Constitutional:  Negative for chills and fever.  Eyes:  Negative for discharge and redness.  Gastrointestinal:  Negative for abdominal pain, nausea and vomiting.  Skin:  Negative for color change, rash and wound.     Physical Exam Triage Vital Signs ED Triage Vitals [11/11/22 1552]  Enc Vitals Group     BP (!) 161/90     Pulse Rate 99     Resp 18     Temp 98.5 F (36.9 C)     Temp Source Oral     SpO2 95 %     Weight      Height      Head Circumference      Peak Flow      Pain Score 4     Pain Loc      Pain Edu?       Excl. in [place]?    No data found.  Updated Vital Signs BP (!) 161/90 (BP Location: Left Arm)   Pulse 99   Temp 98.5 F (36.9 C) (Oral)   Resp 18   SpO2 95%       Physical Exam Vitals and nursing note reviewed.  Constitutional:      General: She  is not in acute distress.    Appearance: Normal appearance. She is not ill-appearing.  HENT:     Head: Normocephalic and atraumatic.  Eyes:     Conjunctiva/sclera: Conjunctivae normal.  Cardiovascular:     Rate and Rhythm: Normal rate.  Pulmonary:     Effort: Pulmonary effort is normal. No respiratory distress.  Skin:    Comments: Minimal scaling noted to left lateral foot where itching is reported  Neurological:     Mental Status: She is alert.  Psychiatric:        Mood and Affect: Mood normal.        Behavior: Behavior normal.        Thought Content: Thought content normal.      UC Treatments / Results  Labs (all labs ordered are listed, but only abnormal results are displayed) Labs Reviewed - No data to display  EKG   Radiology No results found.  Procedures Procedures (including critical care time)  Medications Ordered in UC Medications - No data to display  Initial Impression / Assessment and Plan / UC Course  I have reviewed the triage vital signs and the nursing notes.  Pertinent labs & imaging results that were available during my care of the patient were reviewed by me and considered in my medical decision making (see chart for details).    Will treat conservatively with lotrisone to cover possible fungal cause of symptoms. Recommended follow up with her PCP if no improvement, specifically given known diabetes and symptom recurrence- as there may be underlying neuropathy that should be assessed/ treated. Patient expresses understanding.    Final Clinical Impressions(s) / UC Diagnoses   Final diagnoses:  Pruritus   Discharge Instructions   None    ED Prescriptions     Medication Sig Dispense Auth.  Provider   clotrimazole-betamethasone (LOTRISONE) cream Apply to affected area 2 times daily prn 15 g Francene Finders, PA-C      PDMP not reviewed this encounter.   Francene Finders, PA-C 11/11/22 [phone]

## 2022-11-11 NOTE — ED Triage Notes (Signed)
Pt here for left foot itching and pain x 2 days that is intermittent; denies obvious injury

## 2022-11-21 ENCOUNTER — Ambulatory Visit
Admission: EM | Admit: 2022-11-21 | Discharge: 2022-11-21 | Disposition: A | Discharge status: Home / Self Care | Payer: Medicare Other | Attending: Physician Assistant | Admitting: Physician Assistant

## 2022-11-21 DIAGNOSIS — M79672 Pain in left foot: Secondary | ICD-10-CM | POA: Diagnosis not present

## 2022-11-21 DIAGNOSIS — L299 Pruritus, unspecified: Secondary | ICD-10-CM | POA: Diagnosis not present

## 2022-11-21 DIAGNOSIS — M79671 Pain in right foot: Secondary | ICD-10-CM

## 2022-11-21 MED ORDER — LIDOCAINE 0.5 % EX GEL: 1.0000 | Freq: Two times a day (BID) | CUTANEOUS | 0 refills | Status: AC | PRN

## 2022-11-21 NOTE — ED Provider Notes (Signed)
[name]    CSN: [id number] Arrival date & time: 11/21/22  1747      History   Chief Complaint Chief Complaint  Patient presents with   Foot Pain    HPI Pam Obrien is a 77 y.o. female.   Patient returns today for evaluation of continued itching and pain to her feet.  She states that symptoms were initially only present in left foot but are now present bilaterally.  She states that initially she did have improvement with Lotrisone however symptoms returned.  She has also been trying to use cool soaks without resolution.  She does have appointment scheduled with her primary care doctor towards the end of this month.  She does not report any new symptoms or other concerns.  Symptoms are not constant.  The history is provided by the patient.  Foot Pain    Past Medical History:  Diagnosis Date   Allergy    Anemia    as a young adult   Arthritis    Diabetes mellitus without complication ([place])    Hyperlipidemia    was normal range 05/2017 with PCP   Hypertension     Patient Active Problem List   Diagnosis Date Noted   Hyperlipidemia 07/21/2014   Type II diabetes mellitus, uncontrolled 01/04/2013   ESOPHAGEAL REFLUX 06/18/2010   HYPOKALEMIA, MILD 06/02/2009   Disorder of bone and cartilage 12/11/2007   COLONIC POLYPS, HX OF 12/11/2007   Essential hypertension 06/23/2007   ALLERGIC RHINITIS 06/23/2007   OSTEOARTHRITIS 06/23/2007    Past Surgical History:  Procedure Laterality Date   ABDOMINAL HYSTERECTOMY  1994   COLONOSCOPY  2008   FLEXIBLE SIGMOIDOSCOPY  2003   MASS EXCISION Left 09/06/2022   Procedure: EXCISION MASS LEFT HAND;  Surgeon: Leanora Cover, MD;  Location: [place];  Service: Orthopedics;  Laterality: Left;   POLYPECTOMY      OB History   No obstetric history on file.      Home Medications    Prior to Admission medications   Medication Sig Start Date End Date Taking? Authorizing Provider  Lidocaine 0.5 %  GEL Apply 1 Application topically 2 (two) times daily as needed. 11/21/22  Yes Francene Finders, PA-C  ACCU-CHEK GUIDE test strip USE AS INSTRUCTED TO CHECK BLOOD SUGAR ONCE DAILY. 09/05/22   Elayne Snare, MD  Accu-Chek Softclix Lancets lancets USE AS DIRECTED EVERY DAY 12/31/21   Elayne Snare, MD  acetaminophen (TYLENOL) 500 MG tablet Take 1 tablet (500 mg total) by mouth every 6 (six) hours as needed. 07/30/18   Zigmund Gottron, NP  atenolol-chlorthalidone (TENORETIC) 50-25 MG tablet TAKE 1/2 TABLET BY MOUTH EVERY DAY 09/05/22   Nafziger, [name], NP  Blood Glucose Monitoring Suppl (ACCU-CHEK AVIVA PLUS) w/Device KIT Use Accu Chek Aviva Plus to check blood sugar once daily. 12/07/19   Elayne Snare, MD  calcium citrate-vitamin D (CITRACAL+D) 315-200 MG-UNIT per tablet Take 1 tablet by mouth daily.    [provider]  cholecalciferol (VITAMIN D) 1000 UNITS tablet Take 2,000 Units by mouth daily.    [provider]  clotrimazole-betamethasone (LOTRISONE) cream Apply to affected area 2 times daily prn 11/11/22   Francene Finders, PA-C  FeFum-FePo-FA-B Cmp-C-Zn-Mn-Cu (SE-TAN PLUS) 162-115.2-1 MG CAPS TAKE 1 CAPSULE BY MOUTH EVERY DAY 01/18/22   Nafziger, [name], NP  glimepiride (AMARYL) 4 MG tablet TAKE 1 TABLET BY MOUTH EVERY DAY AT SUPPER 10/08/22   Elayne Snare, MD  hydrOXYzine (ATARAX/VISTARIL) 25 MG  tablet Take 25 mg by mouth 3 (three) times daily as needed. Take 1/2 tablet bid for itching    [provider]  Lancets Misc. (ACCU-CHEK FASTCLIX LANCET) KIT by Does not apply route. USE LANCET TO CHECK BLOOD SUGAR ONCE DAILY.    [provider]  loratadine (CLARITIN) 10 MG tablet Take 1 tablet (10 mg total) by mouth 2 (two) times daily as needed for allergies. 03/14/14   Ricard Dillon, MD  metFORMIN (GLUCOPHAGE) 500 MG tablet Take 500 mg by mouth 2 (two) times daily with a meal.    [provider]  omeprazole (PRILOSEC) 20 MG capsule Take 20 mg by mouth daily.    [provider]  simvastatin (ZOCOR) 20 MG tablet Take 1 tablet (20 mg total) by mouth daily. 06/13/22   Elayne Snare, MD  SitaGLIPtin-MetFORMIN HCl (JANUMET XR) 50-500 MG TB24 2 tabs with dinner daily 06/13/22   Elayne Snare, MD  spironolactone (ALDACTONE) 50 MG tablet TAKE 1 TABLET BY MOUTH EVERY DAY 08/16/22   Elayne Snare, MD  traMADol Veatrice [place]bon) 50 MG tablet 1 tab PO q6 hours prn pain Patient not taking: Reported on 11/21/2022 09/06/22   Leanora Cover, MD  triamcinolone (KENALOG) 0.1 % Apply 1 application topically 2 (two) times daily. 01/27/21   Wieters, Hallie C, PA-C  Triamcinolone Acetonide (TRIAMCINOLONE 0.1 % CREAM : EUCERIN) CREA Apply 1 application topically 2 (two) times daily. 01/27/21   Wieters, Elesa Hacker, PA-C    Family History Family History  Problem Relation Age of Onset   Hypertension Sister    Stroke Other    Diabetes Father    Hypertension Father    Stroke Father    Diabetes Sister    Diabetes Brother    Hypertension Brother    Stroke Brother    Cancer Mother    Esophageal cancer Neg Hx    Rectal cancer Neg Hx    Stomach cancer Neg Hx    Colon cancer Neg Hx     Social History Social History   Tobacco Use   Smoking status: Never   Smokeless tobacco: Never  Vaping Use   Vaping Use: Never used  Substance Use Topics   Alcohol use: No   Drug use: No     Allergies   Aspirin, Beef-derived products, Celecoxib, Milk-related compounds, Mold extract [trichophyton], Nsaids, Peanuts [peanut oil], Shellfish allergy, Sulfamethoxazole, and Vioxx [rofecoxib]   Review of Systems Review of Systems  Constitutional:  Negative for chills and fever.  Eyes:  Negative for discharge and redness.  Gastrointestinal:  Negative for nausea and vomiting.  Musculoskeletal:  Negative for arthralgias and joint swelling.  Skin:  Negative for color change, rash and wound.  Neurological:  Negative for numbness.     Physical Exam Triage Vital Signs ED Triage Vitals  Enc Vitals Group      BP 11/21/22 1932 139/80     Pulse Rate 11/21/22 1932 79     Resp 11/21/22 1932 17     Temp 11/21/22 1932 97.6 F (36.4 C)     Temp src --      SpO2 11/21/22 1932 98 %     Weight --      Height --      Head Circumference --      Peak Flow --      Pain Score 11/21/22 1930 10     Pain Loc --      Pain Edu? --      Excl. in  GC? --    No data found.  Updated Vital Signs BP 139/80   Pulse 79   Temp 97.6 F (36.4 C)   Resp 17   SpO2 98%       Physical Exam Vitals and nursing note reviewed.  Constitutional:      General: She is not in acute distress.    Appearance: Normal appearance. She is not ill-appearing.  HENT:     Head: Normocephalic and atraumatic.  Eyes:     Conjunctiva/sclera: Conjunctivae normal.  Cardiovascular:     Rate and Rhythm: Normal rate.  Pulmonary:     Effort: Pulmonary effort is normal. No respiratory distress.  Musculoskeletal:     Comments: Full ROM of left ankle, foot  Skin:    Comments: Normal coloration of left foot/ toes without rash, erythema, wound  Neurological:     Mental Status: She is alert.     Comments: Gross sensation intact to left toes distally  Psychiatric:        Mood and Affect: Mood normal.        Behavior: Behavior normal.        Thought Content: Thought content normal.      UC Treatments / Results  Labs (all labs ordered are listed, but only abnormal results are displayed) Labs Reviewed - No data to display  EKG   Radiology No results found.  Procedures Procedures (including critical care time)  Medications Ordered in UC Medications - No data to display  Initial Impression / Assessment and Plan / UC Course  I have reviewed the triage vital signs and the nursing notes.  Pertinent labs & imaging results that were available during my care of the patient were reviewed by me and considered in my medical decision making (see chart for details).    Topical lidocaine sent to pharmacy but encouraged follow up  with PCP if she has any persistent or worsening symptoms.   Final Clinical Impressions(s) / UC Diagnoses   Final diagnoses:  Pruritus  Bilateral foot pain     Discharge Instructions       Please follow up with PCP as soon as possible.      ED Prescriptions     Medication Sig Dispense Auth. Provider   Lidocaine 0.5 % GEL Apply 1 Application topically 2 (two) times daily as needed. 170 g Francene Finders, PA-C      PDMP not reviewed this encounter.   Francene Finders, PA-C 11/21/22 1958

## 2022-11-21 NOTE — Discharge Instructions (Signed)
  Please follow up with PCP as soon as possible.

## 2022-11-21 NOTE — ED Triage Notes (Signed)
Pt presents to uc with co of itchy feet with sharp pain bilaterally since the first.

## 2022-12-10 ENCOUNTER — Ambulatory Visit (INDEPENDENT_AMBULATORY_CARE_PROVIDER_SITE_OTHER): Payer: Medicare Other | Admitting: Adult Health

## 2022-12-10 VITALS — BP 122/80 | HR 103 | Temp 98.5°F | Ht 61.0 in | Wt 156.0 lb

## 2022-12-10 DIAGNOSIS — M79672 Pain in left foot: Secondary | ICD-10-CM | POA: Diagnosis not present

## 2022-12-10 DIAGNOSIS — K219 Gastro-esophageal reflux disease without esophagitis: Secondary | ICD-10-CM | POA: Diagnosis not present

## 2022-12-10 DIAGNOSIS — E785 Hyperlipidemia, unspecified: Secondary | ICD-10-CM

## 2022-12-10 DIAGNOSIS — I10 Essential (primary) hypertension: Secondary | ICD-10-CM

## 2022-12-10 DIAGNOSIS — M79671 Pain in right foot: Secondary | ICD-10-CM | POA: Diagnosis not present

## 2022-12-10 DIAGNOSIS — E118 Type 2 diabetes mellitus with unspecified complications: Secondary | ICD-10-CM

## 2022-12-10 LAB — COMPREHENSIVE METABOLIC PANEL
ALT: 13 U/L (ref 0–35)
AST: 18 U/L (ref 0–37)
Albumin: 4.6 g/dL (ref 3.5–5.2)
Alkaline Phosphatase: 73 U/L (ref 39–117)
BUN: 20 mg/dL (ref 6–23)
CO2: 24 mEq/L (ref 19–32)
Calcium: 9.1 mg/dL (ref 8.4–10.5)
Chloride: 105 mEq/L (ref 96–112)
Creatinine, Ser: 1.06 mg/dL (ref 0.40–1.20)
GFR: 50.48 mL/min — ABNORMAL LOW (ref >60.00)
Glucose, Bld: 124 mg/dL — ABNORMAL HIGH (ref 70–99)
Potassium: 4.3 mEq/L (ref 3.5–5.1)
Sodium: 140 mEq/L (ref 135–145)
Total Bilirubin: 0.4 mg/dL (ref 0.2–1.2)
Total Protein: 7.9 g/dL (ref 6.0–8.3)

## 2022-12-10 LAB — CBC WITH DIFFERENTIAL/PLATELET
Basophils Absolute: 0 10*3/uL (ref 0.0–0.1)
Basophils Relative: 0.3 % (ref 0.0–3.0)
Eosinophils Absolute: 0.3 10*3/uL (ref 0.0–0.7)
Eosinophils Relative: 2.8 % (ref 0.0–5.0)
HCT: 34.1 % — ABNORMAL LOW (ref 36.0–46.0)
Hemoglobin: 11.1 g/dL — ABNORMAL LOW (ref 12.0–15.0)
Lymphocytes Relative: 32.2 % (ref 12.0–46.0)
Lymphs Abs: 2.9 10*3/uL (ref 0.7–4.0)
MCHC: 32.6 g/dL (ref 30.0–36.0)
MCV: 83.1 fl (ref 78.0–100.0)
Monocytes Absolute: 0.7 10*3/uL (ref 0.1–1.0)
Monocytes Relative: 7.9 % (ref 3.0–12.0)
Neutro Abs: 5.1 10*3/uL (ref 1.4–7.7)
Neutrophils Relative %: 56.8 % (ref 43.0–77.0)
Platelets: 272 10*3/uL (ref 150.0–400.0)
RBC: 4.11 Mil/uL (ref 3.87–5.11)
RDW: 12.7 % (ref 11.5–15.5)
WBC: 9 10*3/uL (ref 4.0–10.5)

## 2022-12-10 LAB — TSH: TSH: 4.59 u[IU]/mL (ref 0.35–5.50)

## 2022-12-10 NOTE — Progress Notes (Signed)
Subjective:    Patient ID: Pam Obrien, female    DOB: [birth year], 77 y.o.   MRN: [id number]  HPI Patient presents for yearly preventative medicine examination. She is a pleasant 77 year old female who  has a past medical history of Allergy, Anemia, Arthritis, Diabetes mellitus without complication ([place]), Hyperlipidemia, and Hypertension.  DM type II-is followed by endocrinology.  Managed with Metformin 500 mg BID and Amaryl 4 mg ( Janumet was too expensive for her)   She has not checking her blood sugars at home.   Lab Results  Component Value Date   HGBA1C 7.4 (H) 06/10/2022   Hypertension-controlled with atenolol/hydrochlorothiazide 25-12.5 mg and spirloactone 50 mg daily. .  She denies dizziness, lightheadedness, chest pain, or syncopal episodes BP Readings from Last 3 Encounters:  12/10/22 122/80  11/21/22 139/80  11/11/22 (!) 161/90   Hyperlipidemia -prescribed simvastatin 20 mg by endocrinology.  He denies myalgia or fatigue Lab Results  Component Value Date   CHOL 223 (H) 02/07/2022   HDL 56.60 02/07/2022   LDLCALC 148 (H) 02/07/2022   LDLDIRECT 98.0 12/02/2019   TRIG 92.0 02/07/2022   CHOLHDL 4 02/07/2022   GERD - Controlled with Prilosec 20 mg   Bilateral foot pain - She reports that over the last month she has been experiencing intermittent episodes o pruritus and pain in the bottom of her feet. She had this issue back in March 2022; started on left and now has gone to both feet. Reports that the itching has improved but continues to have intermittent sharp pain on the bottom of both feet. Worse when walking. No pain today    All immunizations and health maintenance protocols were reviewed with the patient and needed orders were placed.  Appropriate screening laboratory values were ordered for the patient including screening of hyperlipidemia, renal function and hepatic function.  Medication reconciliation,  past medical history, social history, problem list  and allergies were reviewed in detail with the patient  Goals were established with regard to weight loss, exercise, and  diet in compliance with medications Wt Readings from Last 3 Encounters:  12/10/22 156 lb (70.8 kg)  09/06/22 157 lb 10.1 oz (71.5 kg)  08/14/22 154 lb (69.9 kg)   Review of Systems  Constitutional: Negative.   HENT: Negative.    Eyes: Negative.   Respiratory: Negative.    Cardiovascular: Negative.   Gastrointestinal: Negative.   Endocrine: Negative.   Genitourinary: Negative.   Musculoskeletal:  Positive for arthralgias.  Skin: Negative.   Allergic/Immunologic: Negative.   Neurological: Negative.   Hematological: Negative.   Psychiatric/Behavioral: Negative.     Past Medical History:  Diagnosis Date   Allergy    Anemia    as a young adult   Arthritis    Diabetes mellitus without complication ([place])    Hyperlipidemia    was normal range 05/2017 with PCP   Hypertension     Social History   Socioeconomic History   Marital status: Married    Spouse name: Not on file   Number of children: Not on file   Years of education: Not on file   Highest education level: Not on file  Occupational History   Not on file  Tobacco Use   Smoking status: Never   Smokeless tobacco: Never  Vaping Use   Vaping Use: Never used  Substance and Sexual Activity   Alcohol use: No   Drug use: No   Sexual activity: Not on file  Other Topics  Concern   Not on file  Social History Narrative   Retired from [employer] work    Separated for 9 years.    Five children, two live locally, three are in [place]   One of her daughters live with her   Has a dog.    She likes to dance and walk   Social Determinants of Health   Financial Resource Strain: Low Risk  (08/14/2022)   Overall Financial Resource Strain (CARDIA)    Difficulty of Paying Living Expenses: Not hard at all  Food Insecurity: No Food Insecurity (08/14/2022)   Hunger Vital Sign    Worried About Running Out of  Food in the Last Year: Never true    Ran Out of Food in the Last Year: Never true  Transportation Needs: No Transportation Needs (08/14/2022)   PRAPARE - Hydrologist (Medical): No    Lack of Transportation (Non-Medical): No  Physical Activity: Insufficiently Active (08/14/2022)   Exercise Vital Sign    Days of Exercise per Week: 7 days    Minutes of Exercise per Session: 20 min  Stress: No Stress Concern Present (08/14/2022)   [place]    Feeling of Stress : Not at all  Social Connections: Moderately Integrated (08/14/2022)   Social Connection and Isolation Panel [NHANES]    Frequency of Communication with Friends and Family: More than three times a week    Frequency of Social Gatherings with Friends and Family: More than three times a week    Attends Religious Services: More than 4 times per year    Active Member of Clubs or Organizations: Yes    Attends Archivist Meetings: More than 4 times per year    Marital Status: Divorced  Intimate Partner Violence: Not At Risk (08/14/2022)   Humiliation, Afraid, Rape, and Kick questionnaire    Fear of Current or Ex-Partner: No    Emotionally Abused: No    Physically Abused: No    Sexually Abused: No    Past Surgical History:  Procedure Laterality Date   ABDOMINAL HYSTERECTOMY  1994   COLONOSCOPY  2008   FLEXIBLE SIGMOIDOSCOPY  2003   MASS EXCISION Left 09/06/2022   Procedure: EXCISION MASS LEFT HAND;  Surgeon: Leanora Cover, MD;  Location: [facility];  Service: Orthopedics;  Laterality: Left;   POLYPECTOMY      Family History  Problem Relation Age of Onset   Hypertension Sister    Stroke Other    Diabetes Father    Hypertension Father    Stroke Father    Diabetes Sister    Diabetes Brother    Hypertension Brother    Stroke Brother    Cancer Mother    Esophageal cancer Neg Hx    Rectal cancer Neg Hx     Stomach cancer Neg Hx    Colon cancer Neg Hx     Allergies  Allergen Reactions   Aspirin     REACTION: nervous   Beef-Derived Products     Sneezing, nasal drainage, throat scratchy   Celecoxib     REACTION: joints swell   Milk-Related Compounds     itching   Mold Extract [Trichophyton]     Pt does not know the reaction.  Positive allergy test per pt.   Nsaids     REACTION: sweling   Peanuts [Peanut Oil]     Throat feels scratchy   Shellfish Allergy  Nasal congestion, throat scratchy, eyes watery   Sulfamethoxazole     REACTION: urticaria (hives)   Vioxx [Rofecoxib] Swelling    Current Outpatient Medications on File Prior to Visit  Medication Sig Dispense Refill   ACCU-CHEK GUIDE test strip USE AS INSTRUCTED TO CHECK BLOOD SUGAR ONCE DAILY. 100 strip 2   Accu-Chek Softclix Lancets lancets USE AS DIRECTED EVERY DAY 100 each 1   acetaminophen (TYLENOL) 500 MG tablet Take 1 tablet (500 mg total) by mouth every 6 (six) hours as needed. 30 tablet 0   atenolol-chlorthalidone (TENORETIC) 50-25 MG tablet TAKE 1/2 TABLET BY MOUTH EVERY DAY 45 tablet 2   Blood Glucose Monitoring Suppl (ACCU-CHEK AVIVA PLUS) w/Device KIT Use Accu Chek Aviva Plus to check blood sugar once daily. 1 kit 0   calcium citrate-vitamin D (CITRACAL+D) 315-200 MG-UNIT per tablet Take 1 tablet by mouth daily.     cholecalciferol (VITAMIN D) 1000 UNITS tablet Take 2,000 Units by mouth daily.     clotrimazole-betamethasone (LOTRISONE) cream Apply to affected area 2 times daily prn 15 g 0   FeFum-FePo-FA-B Cmp-C-Zn-Mn-Cu (SE-TAN PLUS) 162-115.2-1 MG CAPS TAKE 1 CAPSULE BY MOUTH EVERY DAY 90 capsule 3   glimepiride (AMARYL) 4 MG tablet TAKE 1 TABLET BY MOUTH EVERY DAY AT SUPPER 90 tablet 1   hydrOXYzine (ATARAX/VISTARIL) 25 MG tablet Take 25 mg by mouth 3 (three) times daily as needed. Take 1/2 tablet bid for itching     Lancets Misc. (ACCU-CHEK FASTCLIX LANCET) KIT by Does not apply route. USE LANCET TO CHECK BLOOD  SUGAR ONCE DAILY.     Lidocaine 0.5 % GEL Apply 1 Application topically 2 (two) times daily as needed. 170 g 0   loratadine (CLARITIN) 10 MG tablet Take 1 tablet (10 mg total) by mouth 2 (two) times daily as needed for allergies. 180 tablet 3   metFORMIN (GLUCOPHAGE) 500 MG tablet Take 500 mg by mouth 2 (two) times daily with a meal.     omeprazole (PRILOSEC) 20 MG capsule Take 20 mg by mouth daily.     simvastatin (ZOCOR) 20 MG tablet Take 1 tablet (20 mg total) by mouth daily. 90 tablet 1   SitaGLIPtin-MetFORMIN HCl (JANUMET XR) 50-500 MG TB24 2 tabs with dinner daily 60 tablet 1   spironolactone (ALDACTONE) 50 MG tablet TAKE 1 TABLET BY MOUTH EVERY DAY 90 tablet 0   traMADol (ULTRAM) 50 MG tablet 1 tab PO q6 hours prn pain 20 tablet 0   triamcinolone (KENALOG) 0.1 % Apply 1 application topically 2 (two) times daily. 45 g 0   Triamcinolone Acetonide (TRIAMCINOLONE 0.1 % CREAM : EUCERIN) CREA Apply 1 application topically 2 (two) times daily. 1 each 0   No current facility-administered medications on file prior to visit.    BP 122/80   Pulse (!) 103   Temp 98.5 F (36.9 C) (Oral)   Ht '5\' 1"'$  (1.549 m)   Wt 156 lb (70.8 kg)   SpO2 97%   BMI 29.48 kg/m       Objective:   Physical Exam Vitals and nursing note reviewed.  Constitutional:      General: She is not in acute distress.    Appearance: Normal appearance. She is well-developed and overweight. She is not ill-appearing.  HENT:     Head: Normocephalic and atraumatic.     Right Ear: Tympanic membrane, ear canal and external ear normal. There is no impacted cerumen.     Left Ear: Tympanic membrane, ear canal and  external ear normal. There is no impacted cerumen.     Nose: Nose normal. No congestion or rhinorrhea.     Mouth/Throat:     Mouth: Mucous membranes are moist.     Pharynx: Oropharynx is clear. No oropharyngeal exudate or posterior oropharyngeal erythema.  Eyes:     General:        Right eye: No discharge.         Left eye: No discharge.     Extraocular Movements: Extraocular movements intact.     Conjunctiva/sclera: Conjunctivae normal.     Pupils: Pupils are equal, round, and reactive to light.  Neck:     Thyroid: No thyromegaly.     Vascular: No carotid bruit.     Trachea: No tracheal deviation.  Cardiovascular:     Rate and Rhythm: Normal rate and regular rhythm.     Pulses: Normal pulses.          Dorsalis pedis pulses are 2+ on the right side and 2+ on the left side.       Posterior tibial pulses are 2+ on the right side and 2+ on the left side.     Heart sounds: Normal heart sounds. No murmur heard.    No friction rub. No gallop.  Pulmonary:     Effort: Pulmonary effort is normal. No respiratory distress.     Breath sounds: Normal breath sounds. No stridor. No wheezing, rhonchi or rales.  Chest:     Chest wall: No tenderness.  Abdominal:     General: Abdomen is flat. Bowel sounds are normal. There is no distension.     Palpations: Abdomen is soft. There is no mass.     Tenderness: There is no abdominal tenderness. There is no right CVA tenderness, left CVA tenderness, guarding or rebound.     Hernia: No hernia is present.  Musculoskeletal:        General: No swelling, tenderness, deformity or signs of injury. Normal range of motion.     Cervical back: Normal range of motion and neck supple.     Right lower leg: No edema.     Left lower leg: No edema.     Right foot: Normal range of motion. No bunion.     Left foot: Normal range of motion. No deformity or bunion.  Feet:     Right foot:     Skin integrity: Skin integrity normal.     Toenail Condition: Right toenails are abnormally thick.     Left foot:     Skin integrity: Skin integrity normal.     Toenail Condition: Left toenails are abnormally thick.  Lymphadenopathy:     Cervical: No cervical adenopathy.  Skin:    General: Skin is warm and dry.     Coloration: Skin is not jaundiced or pale.     Findings: No bruising,  erythema, lesion or rash.  Neurological:     General: No focal deficit present.     Mental Status: She is alert and oriented to person, place, and time.     Cranial Nerves: No cranial nerve deficit.     Sensory: No sensory deficit.     Motor: No weakness.     Coordination: Coordination normal.     Gait: Gait normal.     Deep Tendon Reflexes: Reflexes normal.  Psychiatric:        Mood and Affect: Mood normal.        Behavior: Behavior normal.  Thought Content: Thought content normal.        Judgment: Judgment normal.       Assessment & Plan:  1. Essential hypertension - Well controlled. No change in medication  - Follow up  in one year or sooner if needed - CBC with Differential/Platelet; Future - TSH; Future - Comprehensive metabolic panel; Future - CBC with Differential/Platelet - Comprehensive metabolic panel - TSH  2. Hyperlipidemia, unspecified hyperlipidemia type - Consider increase in statin  - Lipid panel will be done at her appointment with endo next month  - CBC with Differential/Platelet; Future - TSH; Future - Comprehensive metabolic panel; Future - CBC with Differential/Platelet - Comprehensive metabolic panel - TSH  3. Controlled type 2 diabetes mellitus with complication, without long-term current use of insulin ([place]) - Per endocrinology  - CBC with Differential/Platelet; Future - TSH; Future - Comprehensive metabolic panel; Future - CBC with Differential/Platelet - Comprehensive metabolic panel - TSH  4. Gastroesophageal reflux disease without esophagitis - Continue PPI - CBC with Differential/Platelet; Future - TSH; Future - Comprehensive metabolic panel; Future - CBC with Differential/Platelet - Comprehensive metabolic panel - TSH  5. Foot pain, bilateral -Does not appear as Diabetic neuropathy or planter fascitis - She can try switching the insoles in her shoes.  - Would like to wait on Podiatry    [name], NP

## 2022-12-10 NOTE — Patient Instructions (Signed)
It was great seeing you today   We will follow up with you regarding your lab work   Please let me know if you need anything   Please let me know if your feet start hurting again

## 2022-12-12 ENCOUNTER — Telehealth: Payer: Self-pay | Admitting: Adult Health

## 2022-12-12 NOTE — Telephone Encounter (Signed)
Spoke to pt and advise to try shoe inserts per Cory's note. Pt stated she has not tried them she wasn't aware that she was suppose to. Pt advised to call back if shoe inserts does not help. Pt verbalized understanding.

## 2022-12-12 NOTE — Telephone Encounter (Signed)
Pt requesting referral to "foot specialist" per discussion at her appointment on 12/10/22

## 2022-12-25 ENCOUNTER — Other Ambulatory Visit: Payer: Self-pay | Admitting: Endocrinology

## 2022-12-31 ENCOUNTER — Ambulatory Visit: Payer: Medicare Other | Admitting: Endocrinology

## 2023-03-19 ENCOUNTER — Encounter: Payer: Self-pay | Admitting: Endocrinology

## 2023-03-19 ENCOUNTER — Ambulatory Visit (INDEPENDENT_AMBULATORY_CARE_PROVIDER_SITE_OTHER): Payer: Medicare Other | Admitting: Endocrinology

## 2023-03-19 VITALS — BP 146/80 | HR 89 | Ht 61.0 in | Wt 154.4 lb

## 2023-03-19 DIAGNOSIS — E1165 Type 2 diabetes mellitus with hyperglycemia: Secondary | ICD-10-CM | POA: Diagnosis not present

## 2023-03-19 DIAGNOSIS — E7800 Pure hypercholesterolemia, unspecified: Secondary | ICD-10-CM

## 2023-03-19 DIAGNOSIS — Z7984 Long term (current) use of oral hypoglycemic drugs: Secondary | ICD-10-CM | POA: Diagnosis not present

## 2023-03-19 LAB — COMPREHENSIVE METABOLIC PANEL
ALT: 12 U/L (ref 0–35)
AST: 17 U/L (ref 0–37)
Albumin: 4.5 g/dL (ref 3.5–5.2)
Alkaline Phosphatase: 81 U/L (ref 39–117)
BUN: 18 mg/dL (ref 6–23)
CO2: 28 mEq/L (ref 19–32)
Calcium: 8.9 mg/dL (ref 8.4–10.5)
Chloride: 104 mEq/L (ref 96–112)
Creatinine, Ser: 1.13 mg/dL (ref 0.40–1.20)
GFR: 46.66 mL/min — ABNORMAL LOW (ref >60.00)
Glucose, Bld: 155 mg/dL — ABNORMAL HIGH (ref 70–99)
Potassium: 3.8 mEq/L (ref 3.5–5.1)
Sodium: 143 mEq/L (ref 135–145)
Total Bilirubin: 0.6 mg/dL (ref 0.2–1.2)
Total Protein: 7.7 g/dL (ref 6.0–8.3)

## 2023-03-19 LAB — POCT GLYCOSYLATED HEMOGLOBIN (HGB A1C): Hemoglobin A1C: 6.5 % — AB (ref 4.0–5.6)

## 2023-03-19 LAB — MICROALBUMIN / CREATININE URINE RATIO
Creatinine,U: 273.4 mg/dL
Microalb Creat Ratio: 1.5 mg/g (ref 0.0–30.0)
Microalb, Ur: 4.1 mg/dL — ABNORMAL HIGH (ref 0.0–1.9)

## 2023-03-19 LAB — LIPID PANEL
Cholesterol: 218 mg/dL — ABNORMAL HIGH (ref 0–200)
HDL: 53.4 mg/dL (ref >39.00)
LDL Cholesterol: 144 mg/dL — ABNORMAL HIGH (ref 0–99)
NonHDL: 164.51
Total CHOL/HDL Ratio: 4
Triglycerides: 104 mg/dL (ref 0.0–149.0)
VLDL: 20.8 mg/dL (ref 0.0–40.0)

## 2023-03-19 LAB — GLUCOSE, POCT (MANUAL RESULT ENTRY): POC Glucose: 165 mg/dl — AB (ref 70–99)

## 2023-03-19 MED ORDER — PRAVASTATIN SODIUM 10 MG PO TABS: 10.0000 mg | ORAL_TABLET | Freq: Every day | ORAL | 1 refills | Status: DC

## 2023-03-19 MED ORDER — GABAPENTIN 100 MG PO CAPS: 100.0000 mg | ORAL_CAPSULE | Freq: Three times a day (TID) | ORAL | 3 refills | Status: DC

## 2023-03-19 MED ORDER — GLIMEPIRIDE 2 MG PO TABS: 2.0000 mg | ORAL_TABLET | Freq: Every day | ORAL | 3 refills | Status: DC

## 2023-03-19 NOTE — Progress Notes (Signed)
Patient ID: Pam Obrien, female   DOB: Dec 02, 1945, 77 y.o.   MRN: [id number]     Reason for Appointment: Followup for Type 2 Diabetes and related problems   History of Present Illness:          Diagnosis: Type 2 diabetes mellitus, date of diagnosis:  4/13      Past history:  She was initially started on metformin but he thinks that this made her sleepy and had some abdominal discomfort   She also has been tried on other oral hypoglycemic regimens including Jentadueto  since 2014  But she had similar side effects with this  And not clear how regularly she had been taking this until it was discontinued in 5/15  A1c appears to be persistently over 8% Since 2014  She was tried on Invokamet by her PCP but because of excessive urination with this and also some nausea and weakness it was stopped With glyburide alone her blood sugars are poorly controlled and she was referred here for further management She was given Januvia but she did not take this because of expense of the medication   She was referred to the nurse educator and started on basal insulin to improve her control on 06/07/14 This was changed to premixed insulin in 9/15 because of continued poor control and her not being able to afford NovoLog mix insulin or Invokana  She stopped taking insulin in 1/16 because of the cost and was taking only Amaryl.  Recent history:   Hypoglycemic drugs: Glimepiride 4 mg at supper, metformin ER 500 mg in a.m. and pm   Her A1c is 6.5 compared to 7.4 as of 05/31/2022  Current management, blood sugar patterns and problems identified: She has not been seen since 8/23  She did bring her blood sugar monitor but the data is programmed for January Also because of her time being set about 8 hours behind actual time difficult to assess when she is taking the blood sugars However her blood sugars are not consistently high with highest reading recently 176 This is despite not having her  refill for Amaryl for the last 2 weeks Blood sugar in the office was 165 fasting As before she is concerned about the metformin having a significant odor but the recent prescription was better She feels that she is doing well with her diet Again not able to do any walking for exercise She has been reluctant to consider brand-name medications  Glucose monitoring with Accu-Chek meter   AVERAGE for 30 days = 127 Recent blood sugar range 94-176  Side effects from medications have been: Metformin higher doses will cause abdominal discomfort  Self-care: The diet that the patient has been following is: tries to limit  portions      Meals:  usually 2-3 meals per day. Usually has variable intake in the morning, sometimes toast/fruit, usually has carbohydrates at dinner time, will have fruit for snacks        Dietician visit: Most recent: 6/17 .              CDE visit last in 7/15  Wt Readings from Last 3 Encounters:  03/19/23 154 lb 6.4 oz (70 kg)  12/10/22 156 lb (70.8 kg)  09/06/22 157 lb 10.1 oz (71.5 kg)       Glycemic control:   Lab Results  Component Value Date   HGBA1C 6.5 (A) 03/19/2023   HGBA1C 7.4 (H) 06/10/2022   HGBA1C 6.9 (A) 02/07/2022  Lab Results  Component Value Date   MICROALBUR 1.2 02/07/2022   LDLCALC 144 (H) 03/19/2023   CREATININE 1.13 03/19/2023    Office Visit on 03/19/2023  Component Date Value Ref Range Status   Hemoglobin A1C 03/19/2023 6.5 (A)  4.0 - 5.6 % Final   POC Glucose 03/19/2023 165 (A)  70 - 99 mg/dl Final   Cholesterol 16/08/9603 218 (H)  0 - 200 mg/dL Final   ATP III Classification       Desirable:  < 200 mg/dL               Borderline High:  200 - 239 mg/dL          High:  > = 540 mg/dL   Triglycerides 98/09/9146 104.0  0.0 - 149.0 mg/dL Final   Normal:  <829 mg/dLBorderline High:  150 - 199 mg/dL   HDL 56/21/3086 57.84  >39.00 mg/dL Final   VLDL 69/62/9528 20.8  0.0 - 40.0 mg/dL Final   LDL Cholesterol 03/19/2023 144 (H)  0 - 99  mg/dL Final   Total CHOL/HDL Ratio 03/19/2023 4   Final                  Men          Women1/2 Average Risk     3.4          3.3Average Risk          5.0          4.42X Average Risk          9.6          7.13X Average Risk          15.0          11.0                       NonHDL 03/19/2023 164.51   Final   NOTE:  Non-HDL goal should be 30 mg/dL higher than patient's LDL goal (i.e. LDL goal of < 70 mg/dL, would have non-HDL goal of < 100 mg/dL)   Sodium 41/32/4401 027  135 - 145 mEq/L Final   Potassium 03/19/2023 3.8  3.5 - 5.1 mEq/L Final   Chloride 03/19/2023 104  96 - 112 mEq/L Final   CO2 03/19/2023 28  19 - 32 mEq/L Final   Glucose, Bld 03/19/2023 155 (H)  70 - 99 mg/dL Final   BUN 25/36/6440 18  6 - 23 mg/dL Final   Creatinine, Ser 03/19/2023 1.13  0.40 - 1.20 mg/dL Final   Total Bilirubin 03/19/2023 0.6  0.2 - 1.2 mg/dL Final   Alkaline Phosphatase 03/19/2023 81  39 - 117 U/L Final   AST 03/19/2023 17  0 - 37 U/L Final   ALT 03/19/2023 12  0 - 35 U/L Final   Total Protein 03/19/2023 7.7  6.0 - 8.3 g/dL Final   Albumin 34/74/2595 4.5  3.5 - 5.2 g/dL Final   GFR 63/87/5643 46.66 (L)  >60.00 mL/min Final   Calculated using the CKD-EPI Creatinine Equation (2021)   Calcium 03/19/2023 8.9  8.4 - 10.5 mg/dL Final     Weight history:  Previous range upto 172   Wt Readings from Last 3 Encounters:  03/19/23 154 lb 6.4 oz (70 kg)  12/10/22 156 lb (70.8 kg)  09/06/22 157 lb 10.1 oz (71.5 kg)    Allergies as of 03/19/2023       Reactions   Aspirin  REACTION: nervous   Beef-derived Products    Sneezing, nasal drainage, throat scratchy   Celecoxib    REACTION: joints swell   Milk-related Compounds    itching   Mold Extract [trichophyton]    Pt does not know the reaction.  Positive allergy test per pt.   Nsaids    REACTION: sweling   Peanuts [peanut Oil]    Throat feels scratchy   Shellfish Allergy    Nasal congestion, throat scratchy, eyes watery   Sulfamethoxazole     REACTION: urticaria (hives)   Vioxx [rofecoxib] Swelling        Medication List        Accurate as of Mar 19, 2023  3:50 PM. If you have any questions, ask your nurse or doctor.          STOP taking these medications    Janumet XR 50-500 MG Tb24 Generic drug: SitaGLIPtin-MetFORMIN HCl Stopped by: Reather Littler, MD   simvastatin 20 MG tablet Commonly known as: ZOCOR Stopped by: Reather Littler, MD       TAKE these medications    Accu-Chek Aviva Plus w/Device Kit Use Accu Chek Aviva Plus to check blood sugar once daily.   Accu-Chek [employer] Kit by Does not apply route. USE LANCET TO CHECK BLOOD SUGAR ONCE DAILY.   Accu-Chek Guide test strip Generic drug: glucose blood USE AS INSTRUCTED TO CHECK BLOOD SUGAR ONCE DAILY.   Accu-Chek Softclix Lancets lancets USE AS DIRECTED EVERY DAY   acetaminophen 500 MG tablet Commonly known as: TYLENOL Take 1 tablet (500 mg total) by mouth every 6 (six) hours as needed.   atenolol-chlorthalidone 50-25 MG tablet Commonly known as: TENORETIC TAKE 1/2 TABLET BY MOUTH EVERY DAY   calcium citrate-vitamin D 315-200 MG-UNIT tablet Commonly known as: CITRACAL+D Take 1 tablet by mouth daily.   cholecalciferol 1000 units tablet Commonly known as: VITAMIN D Take 2,000 Units by mouth daily.   clotrimazole-betamethasone cream Commonly known as: LOTRISONE Apply to affected area 2 times daily prn   gabapentin 100 MG capsule Commonly known as: NEURONTIN Take 1 capsule (100 mg total) by mouth 3 (three) times daily. Started by: Reather Littler, MD   glimepiride 2 MG tablet Commonly known as: AMARYL Take 1 tablet (2 mg total) by mouth daily with supper. What changed:  medication strength how much to take how to take this when to take this additional instructions Changed by: Reather Littler, MD   Lidocaine 0.5 % Gel Apply 1 Application topically 2 (two) times daily as needed.   metFORMIN 500 MG tablet Commonly known as:  GLUCOPHAGE Take 500 mg by mouth 2 (two) times daily with a meal.   metFORMIN 500 MG 24 hr tablet Commonly known as: GLUCOPHAGE-XR TAKE 2 TABLETS BY MOUTH EVERY DAY WITH SUPPER   omeprazole 20 MG capsule Commonly known as: PRILOSEC Take 20 mg by mouth daily.   pravastatin 10 MG tablet Commonly known as: PRAVACHOL Take 1 tablet (10 mg total) by mouth daily. Started by: Reather Littler, MD   Se-Tan PLUS 162-115.2-1 MG Caps TAKE 1 CAPSULE BY MOUTH EVERY DAY   spironolactone 50 MG tablet Commonly known as: ALDACTONE TAKE 1 TABLET BY MOUTH EVERY DAY        Allergies:  Allergies  Allergen Reactions   Aspirin     REACTION: nervous   Beef-Derived Products     Sneezing, nasal drainage, throat scratchy   Celecoxib     REACTION: joints swell   Milk-Related Compounds  itching   Mold Extract [Trichophyton]     Pt does not know the reaction.  Positive allergy test per pt.   Nsaids     REACTION: sweling   Peanuts [Peanut Oil]     Throat feels scratchy   Shellfish Allergy     Nasal congestion, throat scratchy, eyes watery   Sulfamethoxazole     REACTION: urticaria (hives)   Vioxx [Rofecoxib] Swelling    Past Medical History:  Diagnosis Date   Allergy    Anemia    as a young adult   Arthritis    Diabetes mellitus without complication (HCC)    Hyperlipidemia    was normal range 05/2017 with PCP   Hypertension     Past Surgical History:  Procedure Laterality Date   ABDOMINAL HYSTERECTOMY  1994   COLONOSCOPY  2008   FLEXIBLE SIGMOIDOSCOPY  2003   MASS EXCISION Left 09/06/2022   Procedure: EXCISION MASS LEFT HAND;  Surgeon: Betha Loa, MD;  Location: [facility];  Service: Orthopedics;  Laterality: Left;   POLYPECTOMY      Family History  Problem Relation Age of Onset   Hypertension Sister    Stroke Other    Diabetes Father    Hypertension Father    Stroke Father    Diabetes Sister    Diabetes Brother    Hypertension Brother    Stroke Brother     Cancer Mother    Esophageal cancer Neg Hx    Rectal cancer Neg Hx    Stomach cancer Neg Hx    Colon cancer Neg Hx     Social History:  reports that she has never smoked. She has never used smokeless tobacco. She reports that she does not drink alcohol and does not use drugs.    Review of Systems    HYPERTENSION: She takes a half a tablet of atenolol/chlorthalidone along with half tablet of spironolactone 50 mg Does not check blood pressure at home Prescription given by PCP for Tenoretic  BP Readings from Last 3 Encounters:  03/19/23 (!) 146/80  12/10/22 122/80  11/21/22 139/80    HYPOKALEMIA: This was due to HCTZ It is controlled with Aldactone Taking a half of a 50 mg tablet daily  Lab Results  Component Value Date   K 3.8 03/19/2023   Renal function history  Lab Results  Component Value Date   CREATININE 1.13 03/19/2023   CREATININE 1.06 12/10/2022   CREATININE 1.20 (H) 09/04/2022    Lipids: She did not take pravastatin previously prescribed because of cost Prescribed simvastatin in 7/19   If she takes this daily she will have pain in her arm Although she was able to tolerate this every other day she is safe she has stopped this Lipids generally not at goal   She does have family history of CVA but no known history of CAD    Lab Results  Component Value Date   CHOL 218 (H) 03/19/2023   CHOL 223 (H) 02/07/2022   CHOL 179 09/05/2021   Lab Results  Component Value Date   HDL 53.40 03/19/2023   HDL 56.60 02/07/2022   HDL 54.60 09/05/2021   Lab Results  Component Value Date   LDLCALC 144 (H) 03/19/2023   LDLCALC 148 (H) 02/07/2022   LDLCALC 113 (H) 09/05/2021   Lab Results  Component Value Date   TRIG 104.0 03/19/2023   TRIG 92.0 02/07/2022   TRIG 55.0 09/05/2021   Lab Results  Component Value Date  CHOLHDL 4 03/19/2023   CHOLHDL 4 02/07/2022   CHOLHDL 3 09/05/2021   Lab Results  Component Value Date   LDLDIRECT 98.0 12/02/2019    LDLDIRECT 130.0 05/17/2016   LDLDIRECT 121.7 03/04/2012     Last foot exam was in 5/24  She is complaining about shooting pains and occasional hot feelings in her feet, sometimes at bedtime also but usually during the day   She has had routine thyroid tests and TSH has been periodically slightly above normal Not on supplements   Lab Results  Component Value Date   TSH 4.59 12/10/2022   TSH 5.49 02/07/2022   TSH 2.56 09/05/2021   FREET4 0.84 09/05/2021   FREET4 0.78 01/01/2021   FREET4 1.0 10/20/2020    Physical Examination:  BP (!) 146/80 (BP Location: Left Arm, Patient Position: Sitting, Cuff Size: Normal)   Pulse 89   Ht 5\' 1"  (1.549 m)   Wt 154 lb 6.4 oz (70 kg)   BMI 29.17 kg/m   Diabetic Foot Exam - Simple   Simple Foot Form Diabetic Foot exam was performed with the following findings: Yes   Visual Inspection No deformities, no ulcerations, no other skin breakdown bilaterally: Yes Sensation Testing Intact to touch and monofilament testing bilaterally: Yes Pulse Check Posterior Tibialis and Dorsalis pulse intact bilaterally: Yes Comments        ASSESSMENT:  Diabetes type 2 BMI under 30, non-insulin-dependent  See history of present illness for detailed discussion of  current management, blood sugar patterns and problems identified  A1c is 6.5 and has improved  She is on Amaryl 4 mg and Metformin She likely has had some better diet since her last visit with improvement in blood sugars without changing her medications She did not check Janumet as recommended previously because of cost  Hypercholesterolemia: She did not want to take simvastatin as she thinks it causes arm pain No recent labs available  NEUROPATHY: She has symptoms of neuropathy in her feet without any objective findings currently  History of hypokalemia: Controlled with Aldactone as before  HYPERTENSION: Blood pressure is slightly high today but generally controlled    PLAN:    Her meter was programmed to the right date and time Reminded her about checking blood sugars regularly, alternating fasting and by rotation 2 hours after meals and disc blood sugar targets Start some walking for exercise As a precaution will reduce her Amaryl to 2 mg  Needs more regular follow-up For her lipids she will be given a trial of pravastatin 10 mg, she can try this every other day and then daily For her neuropathy she will be given a trial of gabapentin 100 mg to start with 1 at dinnertime and then titrate up to 3 a day    Patient Instructions  Take Gabapentin for your feet staring 1/day in pm then increase upto 3/day with meals as tolerated      Reather Littler 03/19/2023, 3:50 PM   Note: This office note was prepared with Dragon voice recognition system technology. Any transcriptional errors that result from this process are unintentional.  Addendum: LDL 144, chemistry normal

## 2023-03-19 NOTE — Patient Instructions (Signed)
Take Gabapentin for your feet staring 1/day in pm then increase upto 3/day with meals as tolerated

## 2023-03-25 ENCOUNTER — Other Ambulatory Visit: Payer: Self-pay | Admitting: Endocrinology

## 2023-03-25 ENCOUNTER — Ambulatory Visit (INDEPENDENT_AMBULATORY_CARE_PROVIDER_SITE_OTHER): Payer: Medicare Other | Admitting: Adult Health

## 2023-03-25 ENCOUNTER — Encounter: Payer: Self-pay | Admitting: Adult Health

## 2023-03-25 VITALS — BP 140/80 | HR 98 | Temp 98.1°F | Wt 154.0 lb

## 2023-03-25 DIAGNOSIS — M25562 Pain in left knee: Secondary | ICD-10-CM | POA: Diagnosis not present

## 2023-03-25 DIAGNOSIS — Z6829 Body mass index (BMI) 29.0-29.9, adult: Secondary | ICD-10-CM

## 2023-03-25 DIAGNOSIS — E1169 Type 2 diabetes mellitus with other specified complication: Secondary | ICD-10-CM | POA: Diagnosis not present

## 2023-03-25 DIAGNOSIS — Z7984 Long term (current) use of oral hypoglycemic drugs: Secondary | ICD-10-CM

## 2023-03-25 DIAGNOSIS — E669 Obesity, unspecified: Secondary | ICD-10-CM

## 2023-03-25 DIAGNOSIS — G8929 Other chronic pain: Secondary | ICD-10-CM | POA: Diagnosis not present

## 2023-03-25 MED ORDER — METHYLPREDNISOLONE ACETATE 80 MG/ML IJ SUSP
80.0000 mg | Freq: Once | INTRAMUSCULAR | Status: AC
  Administered 2023-03-25: 80 mg via INTRA_ARTICULAR

## 2023-03-25 NOTE — Progress Notes (Signed)
Subjective:    Patient ID: Pam Obrien, female    DOB: 12-Jul-1946, 77 y.o.   MRN: [id number]  Knee Pain    77 year old female who  has a past medical history of Allergy, Anemia, Arthritis, Diabetes mellitus without complication (HCC), Hyperlipidemia, and Hypertension.  She presents to the office today for left knee pain. She has known end stage arthritis in her left knee. Had an injection back 10 months ago and did well and pain went away. She then went and tried on different insoles to help with foot pain but she feels like the inserts caused the pain in her left knee to come back. She is wondering if she can have another steroid injection.   She was prescribed gabapentin 100 mg TID by Dr. Lucianne Muss for neuropathy but has not started this yet    Review of Systems See HPI   Past Medical History:  Diagnosis Date   Allergy    Anemia    as a young adult   Arthritis    Diabetes mellitus without complication (HCC)    Hyperlipidemia    was normal range 05/2017 with PCP   Hypertension     Social History   Socioeconomic History   Marital status: Married    Spouse name: Not on file   Number of children: Not on file   Years of education: Not on file   Highest education level: Not on file  Occupational History   Not on file  Tobacco Use   Smoking status: Never   Smokeless tobacco: Never  Vaping Use   Vaping Use: Never used  Substance and Sexual Activity   Alcohol use: No   Drug use: No   Sexual activity: Not on file  Other Topics Concern   Not on file  Social History Narrative   Retired from [employer] work    Separated for 9 years.    Five children, two live locally, three are in [place]   One of her daughters live with her   Has a dog.    She likes to dance and walk   Social Determinants of Health   Financial Resource Strain: Low Risk  (08/14/2022)   Overall Financial Resource Strain (CARDIA)    Difficulty of Paying Living Expenses: Not hard at all  Food  Insecurity: No Food Insecurity (08/14/2022)   Hunger Vital Sign    Worried About Running Out of Food in the Last Year: Never true    Ran Out of Food in the Last Year: Never true  Transportation Needs: No Transportation Needs (08/14/2022)   PRAPARE - Administrator, Civil Service (Medical): No    Lack of Transportation (Non-Medical): No  Physical Activity: Insufficiently Active (08/14/2022)   Exercise Vital Sign    Days of Exercise per Week: 7 days    Minutes of Exercise per Session: 20 min  Stress: No Stress Concern Present (08/14/2022)   [employer] of Occupational Health - Occupational Stress Questionnaire    Feeling of Stress : Not at all  Social Connections: Moderately Integrated (08/14/2022)   Social Connection and Isolation Panel [NHANES]    Frequency of Communication with Friends and Family: More than three times a week    Frequency of Social Gatherings with Friends and Family: More than three times a week    Attends Religious Services: More than 4 times per year    Active Member of [employer] or Organizations: Yes    Attends Banker  Meetings: More than 4 times per year    Marital Status: Divorced  Intimate Partner Violence: Not At Risk (08/14/2022)   Humiliation, Afraid, Rape, and Kick questionnaire    Fear of Current or Ex-Partner: No    Emotionally Abused: No    Physically Abused: No    Sexually Abused: No    Past Surgical History:  Procedure Laterality Date   ABDOMINAL HYSTERECTOMY  1994   COLONOSCOPY  2008   FLEXIBLE SIGMOIDOSCOPY  2003   MASS EXCISION Left 09/06/2022   Procedure: EXCISION MASS LEFT HAND;  Surgeon: Betha Loa, MD;  Location: [facility];  Service: Orthopedics;  Laterality: Left;   POLYPECTOMY      Family History  Problem Relation Age of Onset   Hypertension Sister    Stroke Other    Diabetes Father    Hypertension Father    Stroke Father    Diabetes Sister    Diabetes Brother    Hypertension Brother     Stroke Brother    Cancer Mother    Esophageal cancer Neg Hx    Rectal cancer Neg Hx    Stomach cancer Neg Hx    Colon cancer Neg Hx     Allergies  Allergen Reactions   Aspirin     REACTION: nervous   Beef-Derived Products     Sneezing, nasal drainage, throat scratchy   Celecoxib     REACTION: joints swell   Milk-Related Compounds     itching   Mold Extract [Trichophyton]     Pt does not know the reaction.  Positive allergy test per pt.   Nsaids     REACTION: sweling   Peanuts [Peanut Oil]     Throat feels scratchy   Shellfish Allergy     Nasal congestion, throat scratchy, eyes watery   Sulfa Antibiotics     Other Reaction(s): RASH-HIVES   Sulfamethoxazole     REACTION: urticaria (hives)   Vioxx [Rofecoxib] Swelling    Current Outpatient Medications on File Prior to Visit  Medication Sig Dispense Refill   ACCU-CHEK GUIDE test strip USE AS INSTRUCTED TO CHECK BLOOD SUGAR ONCE DAILY. 100 strip 2   Accu-Chek Softclix Lancets lancets USE AS DIRECTED EVERY DAY 100 each 1   acetaminophen (TYLENOL) 500 MG tablet Take 1 tablet (500 mg total) by mouth every 6 (six) hours as needed. 30 tablet 0   atenolol-chlorthalidone (TENORETIC) 50-25 MG tablet TAKE 1/2 TABLET BY MOUTH EVERY DAY 45 tablet 2   Blood Glucose Monitoring Suppl (ACCU-CHEK AVIVA PLUS) w/Device KIT Use Accu Chek Aviva Plus to check blood sugar once daily. 1 kit 0   calcium citrate-vitamin D (CITRACAL+D) 315-200 MG-UNIT per tablet Take 1 tablet by mouth daily.     cholecalciferol (VITAMIN D) 1000 UNITS tablet Take 2,000 Units by mouth daily.     clotrimazole-betamethasone (LOTRISONE) cream Apply to affected area 2 times daily prn 15 g 0   FeFum-FePo-FA-B Cmp-C-Zn-Mn-Cu (SE-TAN PLUS) 162-115.2-1 MG CAPS TAKE 1 CAPSULE BY MOUTH EVERY DAY 90 capsule 3   gabapentin (NEURONTIN) 100 MG capsule Take 1 capsule (100 mg total) by mouth 3 (three) times daily. 90 capsule 3   glimepiride (AMARYL) 2 MG tablet Take 1 tablet (2 mg  total) by mouth daily with supper. 30 tablet 3   Lancets Misc. (ACCU-CHEK FASTCLIX LANCET) KIT by Does not apply route. USE LANCET TO CHECK BLOOD SUGAR ONCE DAILY.     Lidocaine 0.5 % GEL Apply 1 Application topically 2 (two)  times daily as needed. 170 g 0   metFORMIN (GLUCOPHAGE-XR) 500 MG 24 hr tablet TAKE 2 TABLETS BY MOUTH EVERY DAY WITH SUPPER 180 tablet 1   omeprazole (PRILOSEC) 20 MG capsule Take 20 mg by mouth daily.     pravastatin (PRAVACHOL) 10 MG tablet Take 1 tablet (10 mg total) by mouth daily. 30 tablet 1   No current facility-administered medications on file prior to visit.    BP (!) 140/80   Pulse 98   Temp 98.1 F (36.7 C)   Wt 154 lb (69.9 kg)   SpO2 97%   BMI 29.10 kg/m       Objective:   Physical Exam Vitals and nursing note reviewed.  Constitutional:      Appearance: Normal appearance.  Cardiovascular:     Rate and Rhythm: Normal rate and regular rhythm.     Pulses: Normal pulses.     Heart sounds: Normal heart sounds.  Musculoskeletal:        General: Tenderness present. No swelling, deformity or signs of injury. Normal range of motion.  Skin:    General: Skin is warm and dry.  Neurological:     General: No focal deficit present.     Mental Status: She is alert and oriented to person, place, and time.  Psychiatric:        Mood and Affect: Mood normal.        Behavior: Behavior normal.        Thought Content: Thought content normal.        Judgment: Judgment normal.       Assessment & Plan:  1. Chronic pain of left knee Joint Injection/Arthrocentesis  Date/Time: 03/25/2023 2:30 PM  Performed by: Shirline Frees, NP Authorized by: Shirline Frees, NP  Indications: pain  Body area: knee Joint: left knee Local anesthesia used: yes  Anesthesia: Local anesthesia used: yes Local Anesthetic: topical anesthetic and lidocaine 2% without epinephrine  Sedation: Patient sedated: no  Needle size: 22 G Ultrasound guidance: no Approach:  posterior Methylprednisolone amount: 80 mg Lidocaine 1% amount: 2 mL Patient tolerance: patient tolerated the procedure well with no immediate complications     - methylPREDNISolone acetate (DEPO-MEDROL) injection 80 mg

## 2023-04-03 ENCOUNTER — Other Ambulatory Visit: Payer: Self-pay | Admitting: Endocrinology

## 2023-04-04 ENCOUNTER — Other Ambulatory Visit: Payer: Self-pay | Admitting: Endocrinology

## 2023-04-07 IMAGING — DX DG FOOT COMPLETE 3+V*L*
3 series · 3 of 3 positions shown · non-contrast
Comparison: None.

CLINICAL DATA: Left foot pain, no injury

EXAM:
LEFT FOOT - COMPLETE 3+ VIEW

[foot ap]
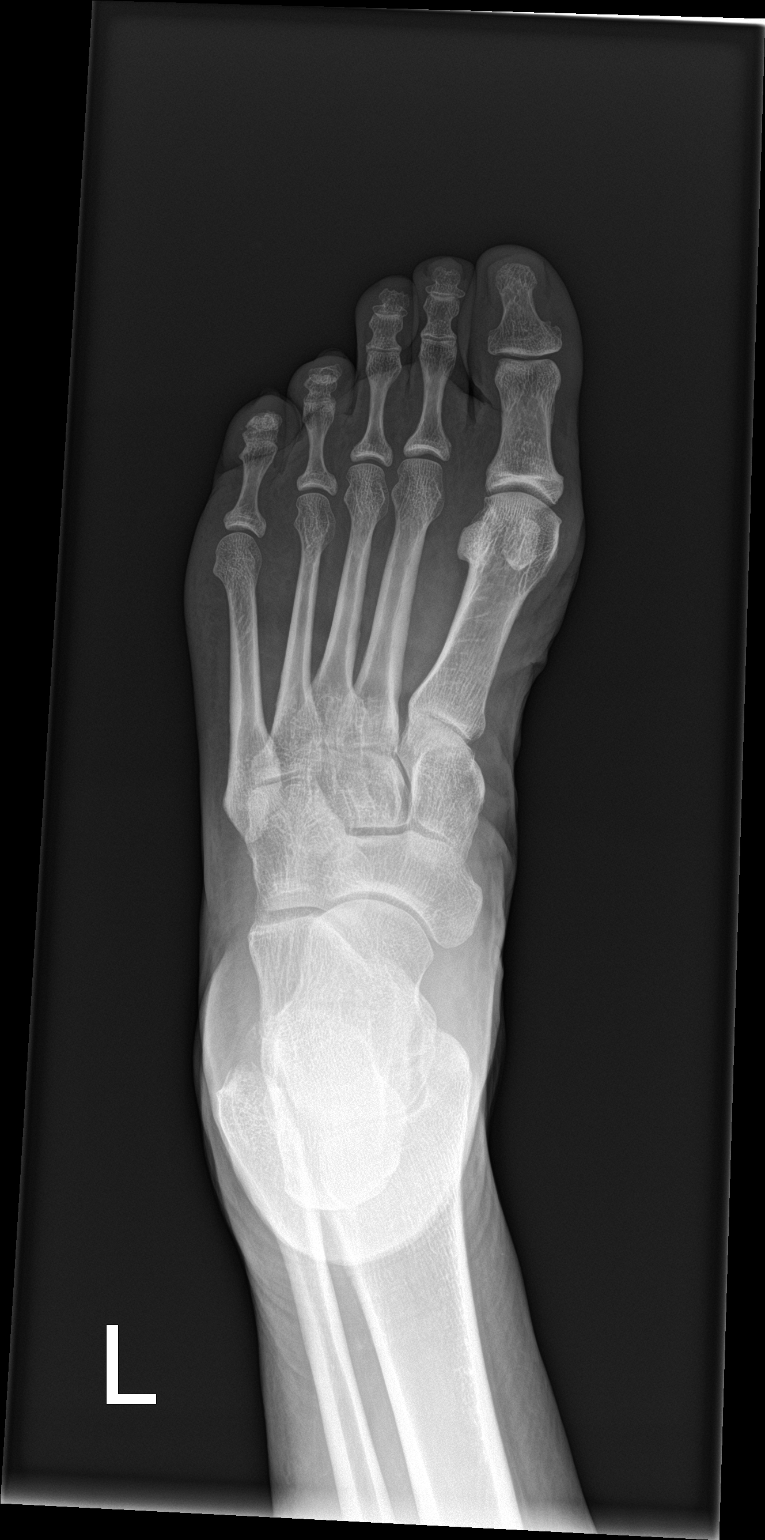

[foot obl]
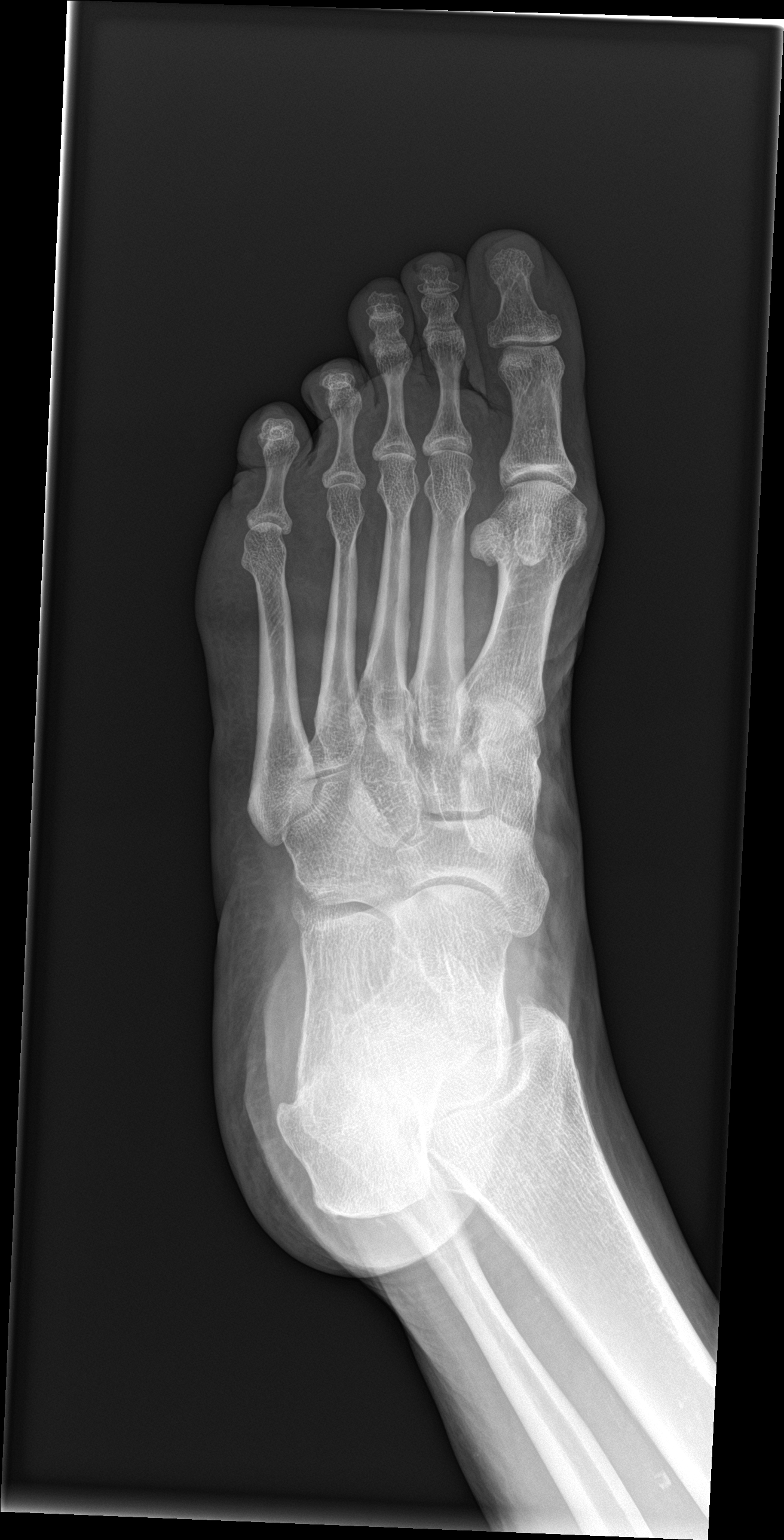

[foot lat]
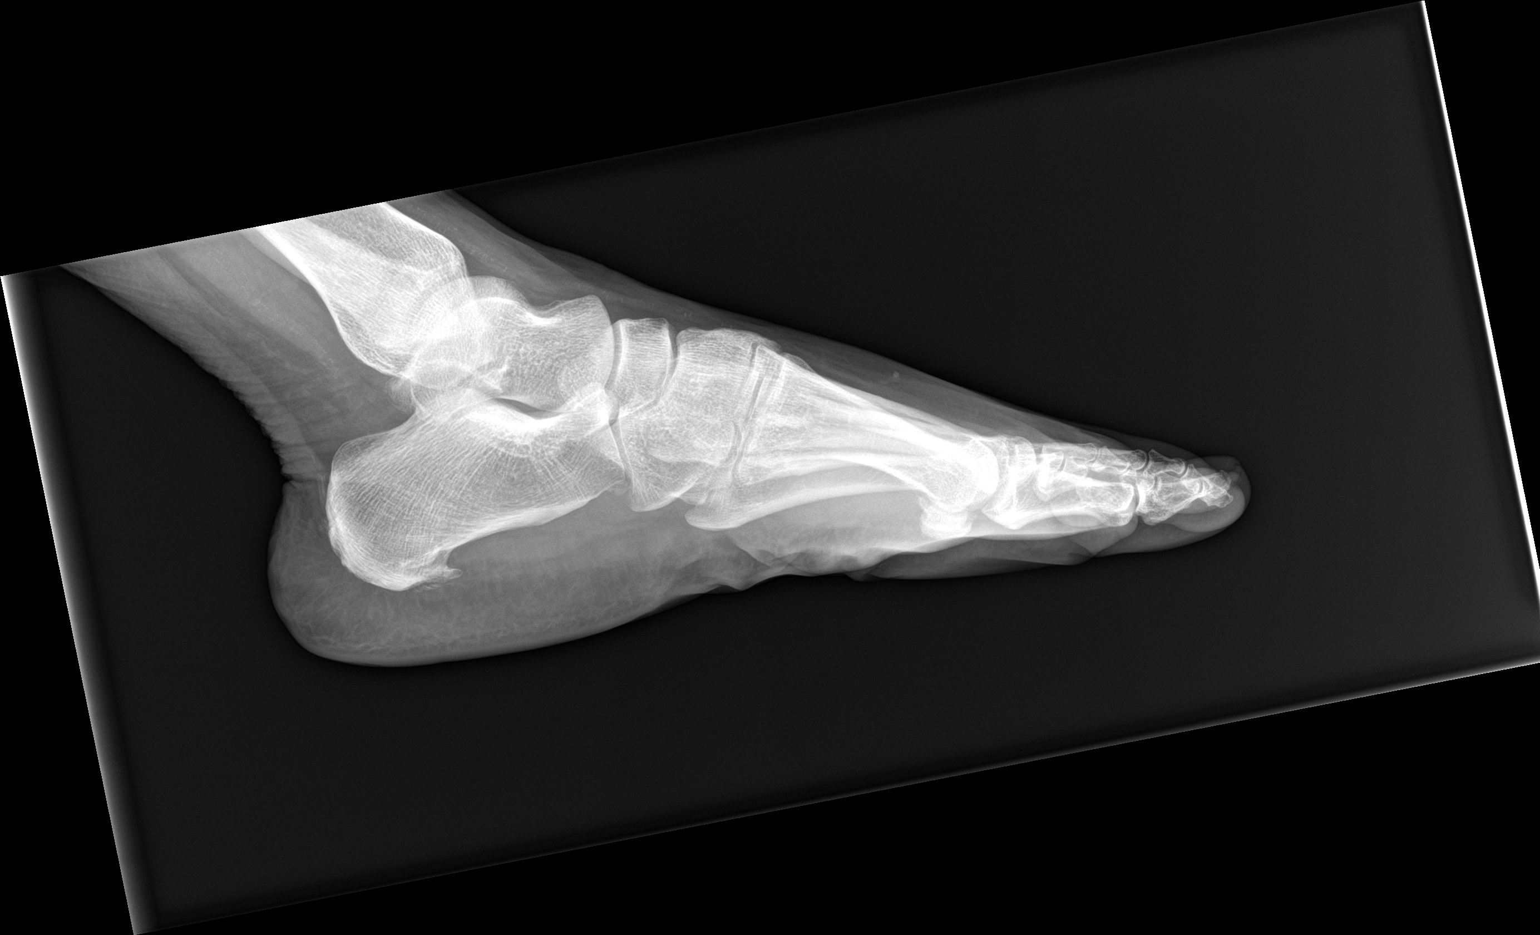

[3 of 3 positions shown; findings below may reference images not displayed]

FINDINGS: Plantar calcaneal spur. No acute bony abnormality. Specifically, no
fracture, subluxation, or dislocation.
IMPRESSION: No acute bony abnormality.

## 2023-04-16 ENCOUNTER — Other Ambulatory Visit: Payer: Self-pay | Admitting: Endocrinology

## 2023-05-06 ENCOUNTER — Other Ambulatory Visit: Payer: Self-pay | Admitting: Adult Health

## 2023-05-06 ENCOUNTER — Other Ambulatory Visit: Payer: Self-pay | Admitting: Endocrinology

## 2023-05-06 DIAGNOSIS — I10 Essential (primary) hypertension: Secondary | ICD-10-CM

## 2023-06-10 ENCOUNTER — Other Ambulatory Visit: Payer: Medicare Other

## 2023-06-13 ENCOUNTER — Ambulatory Visit: Payer: Medicare Other | Admitting: Endocrinology

## 2023-06-19 ENCOUNTER — Other Ambulatory Visit: Payer: Self-pay | Admitting: Endocrinology

## 2023-06-20 ENCOUNTER — Other Ambulatory Visit: Payer: Self-pay | Admitting: Endocrinology

## 2023-06-23 ENCOUNTER — Other Ambulatory Visit: Payer: Medicare Other

## 2023-06-26 ENCOUNTER — Ambulatory Visit: Payer: Medicare Other | Admitting: Endocrinology

## 2023-07-28 ENCOUNTER — Encounter: Payer: Self-pay | Admitting: Endocrinology

## 2023-07-28 ENCOUNTER — Ambulatory Visit: Payer: Medicare HMO | Admitting: Endocrinology

## 2023-07-28 VITALS — BP 140/80 | HR 88 | Resp 20 | Ht 61.0 in | Wt 152.8 lb

## 2023-07-28 DIAGNOSIS — Z7984 Long term (current) use of oral hypoglycemic drugs: Secondary | ICD-10-CM

## 2023-07-28 DIAGNOSIS — E1165 Type 2 diabetes mellitus with hyperglycemia: Secondary | ICD-10-CM | POA: Diagnosis not present

## 2023-07-28 LAB — POCT GLYCOSYLATED HEMOGLOBIN (HGB A1C): Hemoglobin A1C: 7.6 % — AB (ref 4.0–5.6)

## 2023-07-28 NOTE — Patient Instructions (Signed)
Same dose of amaryl and metformin.  Stay away from ice cream or any sugary snacks.    Have your diabetic eye exam.

## 2023-07-28 NOTE — Progress Notes (Unsigned)
Outpatient Endocrinology Note [place] Colburn Asper, MD  07/29/23  Patient's Name: Pam Obrien    DOB: Mar 29, 1946    MRN: [id number]                                                    REASON OF VISIT: Follow up for type 2 diabetes mellitus  PCP: Shirline Frees, NP  HISTORY OF PRESENT ILLNESS:   Pam Obrien is a 77 y.o. old female with past medical history listed below, is here for follow up of type 2 diabetes mellitus.  Patient was last seen by Dr. Lucianne Muss in May 2024.  Pertinent Diabetes History: Patient was diagnosed with type 2 diabetes mellitus in 2013.  She was initially treated with metformin, was stopped due to abdominal discomfort.  She had tried other oral medications including Jentadueto, around 2014 and it stopped in 2015.  She had taken Invokamet had excessive urination, nausea and weakness and stopped later.  She had been on glyburide in the past.  Januvia was planned in the past however not taken due to cost.  Insulin therapy was started in 2015.  However insulin was stopped in 2016 because of cost and has only been taking glimepiride.   Chronic Diabetes Complications : Retinopathy: no. Last ophthalmology exam was done on Due.  Nephropathy: CKD Peripheral neuropathy: yes, on gabapentin.  Coronary artery disease: no Stroke: no  Relevant comorbidities and cardiovascular risk factors: Obesity: no Body mass index is 28.87 kg/m.  Hypertension: yes Hyperlipidemia. Yes, on statin.   Current / Home Diabetic regimen includes: Metformin extended release 1000 mg daily/with supper. Amaryl 2 mg daily with supper.  Prior diabetic medications: Glyburide, Invokamet, basal, bolus insulin, premixed insulin, jentadueto.  Janumet.  Higher dose of metformin caused abdominal discomfort.  Glycemic data:   She forgot to bring glucometer in the clinic today. She has been checking blood sugar at least once a day and she reports blood sugar in the range of 125-150.  No recent  hypoglycemia.  She reports few weeks ago she had 1 episode of hypoglycemia with blood sugar of 50.    Hypoglycemia: Patient has no hypoglycemic episodes. Patient has hypoglycemia awareness.  Factors modifying glucose control: 1.  Diabetic diet assessment: 2-3 meals a day.  Fruits for snacks.  Occasional ice cream.  2.  Staying active or exercising: No exercise.  3.  Medication compliance: compliant all of the time.  Interval history 07/29/23 She has been taking glimepiride 2 mg daily and metformin 1000 mg daily.  Hemoglobin A1c today 7.6%, worsening.  She reports she has been lately eating ice cream and other high carbohydrate meals.  Denies any other complaints today.  REVIEW OF SYSTEMS As per history of present illness.   PAST MEDICAL HISTORY: Past Medical History:  Diagnosis Date   Allergy    Anemia    as a young adult   Arthritis    Diabetes mellitus without complication (HCC)    Hyperlipidemia    was normal range 05/2017 with PCP   Hypertension     PAST SURGICAL HISTORY: Past Surgical History:  Procedure Laterality Date   ABDOMINAL HYSTERECTOMY  1994   COLONOSCOPY  2008   FLEXIBLE SIGMOIDOSCOPY  2003   MASS EXCISION Left 09/06/2022   Procedure: EXCISION MASS LEFT HAND;  Surgeon: Betha Loa, MD;  Location: [facility];  Service: Orthopedics;  Laterality: Left;   POLYPECTOMY      ALLERGIES: Allergies  Allergen Reactions   Aspirin     REACTION: nervous   Beef-Derived Products     Sneezing, nasal drainage, throat scratchy   Celecoxib     REACTION: joints swell   Milk-Related Compounds     itching   Mold Extract [Trichophyton]     Pt does not know the reaction.  Positive allergy test per pt.   Nsaids     REACTION: sweling   Peanuts [Peanut Oil]     Throat feels scratchy   Shellfish Allergy     Nasal congestion, throat scratchy, eyes watery   Sulfa Antibiotics     Other Reaction(s): RASH-HIVES   Sulfamethoxazole     REACTION: urticaria  (hives)   Vioxx [Rofecoxib] Swelling    FAMILY HISTORY:  Family History  Problem Relation Age of Onset   Hypertension Sister    Stroke Other    Diabetes Father    Hypertension Father    Stroke Father    Diabetes Sister    Diabetes Brother    Hypertension Brother    Stroke Brother    Cancer Mother    Esophageal cancer Neg Hx    Rectal cancer Neg Hx    Stomach cancer Neg Hx    Colon cancer Neg Hx     SOCIAL HISTORY: Social History   Socioeconomic History   Marital status: Married    Spouse name: Not on file   Number of children: Not on file   Years of education: Not on file   Highest education level: Not on file  Occupational History   Not on file  Tobacco Use   Smoking status: Never   Smokeless tobacco: Never  Vaping Use   Vaping status: Never Used  Substance and Sexual Activity   Alcohol use: No   Drug use: No   Sexual activity: Not on file  Other Topics Concern   Not on file  Social History Narrative   Retired from [employer] work    Separated for 9 years.    Five children, two live locally, three are in [place]   One of her daughters live with her   Has a dog.    She likes to dance and walk   Social Determinants of Health   Financial Resource Strain: Low Risk  (08/14/2022)   Overall Financial Resource Strain (CARDIA)    Difficulty of Paying Living Expenses: Not hard at all  Food Insecurity: No Food Insecurity (08/14/2022)   Hunger Vital Sign    Worried About Running Out of Food in the Last Year: Never true    Ran Out of Food in the Last Year: Never true  Transportation Needs: No Transportation Needs (08/14/2022)   PRAPARE - Administrator, Civil Service (Medical): No    Lack of Transportation (Non-Medical): No  Physical Activity: Insufficiently Active (08/14/2022)   Exercise Vital Sign    Days of Exercise per Week: 7 days    Minutes of Exercise per Session: 20 min  Stress: No Stress Concern Present (08/14/2022)   [employer] of  Occupational Health - Occupational Stress Questionnaire    Feeling of Stress : Not at all  Social Connections: Moderately Integrated (08/14/2022)   Social Connection and Isolation Panel [NHANES]    Frequency of Communication with Friends and Family: More than three times a week    Frequency of Social Gatherings with Friends  and Family: More than three times a week    Attends Religious Services: More than 4 times per year    Active Member of Clubs or Organizations: Yes    Attends Engineer, structural: More than 4 times per year    Marital Status: Divorced    MEDICATIONS:  Current Outpatient Medications  Medication Sig Dispense Refill   ACCU-CHEK GUIDE test strip USE AS INSTRUCTED TO CHECK BLOOD SUGAR ONCE DAILY. 100 strip 2   Accu-Chek Softclix Lancets lancets USE AS DIRECTED EVERY DAY 100 each 1   acetaminophen (TYLENOL) 500 MG tablet Take 1 tablet (500 mg total) by mouth every 6 (six) hours as needed. 30 tablet 0   atenolol-chlorthalidone (TENORETIC) 50-25 MG tablet TAKE 1/2 TABLET BY MOUTH DAILY 45 tablet 2   Blood Glucose Monitoring Suppl (ACCU-CHEK AVIVA PLUS) w/Device KIT Use Accu Chek Aviva Plus to check blood sugar once daily. 1 kit 0   calcium citrate-vitamin D (CITRACAL+D) 315-200 MG-UNIT per tablet Take 1 tablet by mouth daily.     cholecalciferol (VITAMIN D) 1000 UNITS tablet Take 2,000 Units by mouth daily.     FeFum-FePo-FA-B Cmp-C-Zn-Mn-Cu (SE-TAN PLUS) 162-115.2-1 MG CAPS TAKE 1 CAPSULE BY MOUTH EVERY DAY 90 capsule 3   gabapentin (NEURONTIN) 100 MG capsule Take 1 capsule (100 mg total) by mouth 3 (three) times daily. 90 capsule 3   glimepiride (AMARYL) 2 MG tablet TAKE 1 TABLET (2 MG TOTAL) BY MOUTH DAILY WITH SUPPER. 90 tablet 0   Lancets Misc. (ACCU-CHEK FASTCLIX LANCET) KIT by Does not apply route. USE LANCET TO CHECK BLOOD SUGAR ONCE DAILY.     metFORMIN (GLUCOPHAGE-XR) 500 MG 24 hr tablet TAKE 2 TABLETS BY MOUTH EVERY DAY WITH SUPPER 180 tablet 1   omeprazole  (PRILOSEC) 20 MG capsule Take 20 mg by mouth daily.     pravastatin (PRAVACHOL) 10 MG tablet TAKE 1 TABLET BY MOUTH EVERY DAY 90 tablet 0   spironolactone (ALDACTONE) 50 MG tablet TAKE 1 TABLET BY MOUTH EVERY DAY 90 tablet 0   clotrimazole-betamethasone (LOTRISONE) cream Apply to affected area 2 times daily prn (Patient not taking: Reported on 07/28/2023) 15 g 0   Lidocaine 0.5 % GEL Apply 1 Application topically 2 (two) times daily as needed. (Patient not taking: Reported on 07/28/2023) 170 g 0   No current facility-administered medications for this visit.    PHYSICAL EXAM: Vitals:   07/28/23 1542  BP: (!) 140/80  Pulse: 88  Resp: 20  SpO2: 99%  Weight: 152 lb 12.8 oz (69.3 kg)  Height: 5\' 1"  (1.549 m)   Body mass index is 28.87 kg/m.  Wt Readings from Last 3 Encounters:  07/28/23 152 lb 12.8 oz (69.3 kg)  03/25/23 154 lb (69.9 kg)  03/19/23 154 lb 6.4 oz (70 kg)    General: Well developed, well nourished female in no apparent distress.  HEENT: AT/[place], no external lesions.  Eyes: Conjunctiva clear and no icterus. Neck: Neck supple  Lungs: Respirations not labored Neurologic: Alert, oriented, normal speech Extremities / Skin: Dry.  Psychiatric: Does not appear depressed or anxious  Diabetic Foot Exam - Simple   No data filed    LABS Reviewed Lab Results  Component Value Date   HGBA1C 7.6 (A) 07/28/2023   HGBA1C 6.5 (A) 03/19/2023   HGBA1C 7.4 (H) 06/10/2022   Lab Results  Component Value Date   FRUCTOSAMINE 294 (H) 02/07/2020   FRUCTOSAMINE 323 (H) 01/17/2017   FRUCTOSAMINE 463 (H) 02/20/2016   Lab  Results  Component Value Date   CHOL 218 (H) 03/19/2023   HDL 53.40 03/19/2023   LDLCALC 144 (H) 03/19/2023   LDLDIRECT 98.0 12/02/2019   TRIG 104.0 03/19/2023   CHOLHDL 4 03/19/2023   Lab Results  Component Value Date   MICRALBCREAT 1.5 03/19/2023   MICRALBCREAT 0.6 02/07/2022   Lab Results  Component Value Date   CREATININE 1.13 03/19/2023   Lab Results   Component Value Date   GFR 46.66 (L) 03/19/2023    ASSESSMENT / PLAN  1. Uncontrolled type 2 diabetes mellitus with hyperglycemia, without long-term current use of insulin (HCC)     Diabetes Mellitus type 2, complicated by peripheral neuropathy and CKD. - Diabetic status / severity: Uncontrolled.  Lab Results  Component Value Date   HGBA1C 7.6 (A) 07/28/2023    - Hemoglobin A1c goal : <7%  Discussed about avoiding ice cream and other sugary snack.  Discussed about portion control of the meals.  - Medications: No change.  I) continue Amaryl milligram daily with supper. II) continue metformin extended release 1000 mg daily with supper.  - Home glucose testing: Check at least 2 times a day in the morning fasting and at bedtime. - Discussed/ Gave Hypoglycemia treatment plan.  # Consult : not required at this time.   # Annual urine for microalbuminuria/ creatinine ratio, no microalbuminuria currently. Last  Lab Results  Component Value Date   MICRALBCREAT 1.5 03/19/2023    # Foot check nightly / neuropathy, continue gabapentin 100 mg 3 times a day.  # Annual dilated diabetic eye exams.   - Diet: Make healthy diabetic food choices - Life style / activity / exercise: Discussed.  2. Blood pressure  -  BP Readings from Last 1 Encounters:  07/28/23 (!) 140/80    - Control is in target.  - No change in current plans.  3. Lipid status / Hyperlipidemia - Last  Lab Results  Component Value Date   LDLCALC 144 (H) 03/19/2023   - Continue pravastatin 10 mg daily.  Pam Obrien was seen today for hypertension.  Diagnoses and all orders for this visit:  Uncontrolled type 2 diabetes mellitus with hyperglycemia, without long-term current use of insulin (HCC) -     POCT glycosylated hemoglobin (Hb A1C)    DISPOSITION Follow up in clinic in 4 months suggested.   All questions answered and patient verbalized understanding of the plan.  [place] Aleda Madl, MD [facility]  Endocrinology [facility] [address] [place], [address] [place], [place] [address] Phone # [phone]  At least part of this note was generated using voice recognition software. Inadvertent word errors may have occurred, which were not recognized during the proofreading process.

## 2023-07-29 ENCOUNTER — Encounter: Payer: Self-pay | Admitting: Endocrinology

## 2023-08-19 ENCOUNTER — Ambulatory Visit (INDEPENDENT_AMBULATORY_CARE_PROVIDER_SITE_OTHER): Payer: Medicare HMO | Admitting: Family Medicine

## 2023-08-19 ENCOUNTER — Encounter: Payer: Self-pay | Admitting: Family Medicine

## 2023-08-19 DIAGNOSIS — Z0000 Encounter for general adult medical examination without abnormal findings: Secondary | ICD-10-CM

## 2023-08-19 NOTE — Patient Instructions (Signed)
I really enjoyed getting to talk with you today! I am available on Tuesdays and Thursdays for virtual visits if you have any questions or concerns, or if I can be of any further assistance.   CHECKLIST FROM ANNUAL WELLNESS VISIT:  -Follow up (please call to schedule if not scheduled after visit):   -yearly for annual wellness visit with primary care office  Here is a list of your preventive care/health maintenance measures and the plan for each if any are due:  PLAN For any measures below that may be due:   Health Maintenance  Topic Date Due   COVID-19 Vaccine (1 - 2023-24 season) 09/04/2023 (Originally 07/13/2023)   Zoster Vaccines- Shingrix (1 of 2) 11/19/2023 (Originally 11/20/1994)   OPHTHALMOLOGY EXAM  12/09/2023 (Originally 06/21/2021)   INFLUENZA VACCINE  02/09/2024 (Originally 06/12/2023)   DTaP/Tdap/Td (2 - Tdap) 08/18/2024 (Originally 11/11/2016)   HEMOGLOBIN A1C  01/25/2024   Diabetic kidney evaluation - eGFR measurement  03/18/2024   Diabetic kidney evaluation - Urine ACR  03/18/2024   FOOT EXAM  03/18/2024   Medicare Annual Wellness (AWV)  08/18/2024   Pneumonia Vaccine 6+ Years old  Completed   DEXA SCAN  Completed   Hepatitis C Screening  Completed   HPV VACCINES  Aged Out   Colonoscopy  Discontinued    -See a dentist at least yearly  -Get your eyes checked and then per your eye specialist's recommendations  -Other issues addressed today:   -I have included below further information regarding a healthy whole foods based diet, physical activity guidelines for adults, stress management and opportunities for social connections. I hope you find this information useful.   -----------------------------------------------------------------------------------------------------------------------------------------------------------------------------------------------------------------------------------------------------------  NUTRITION: -eat real food: lots of colorful  vegetables (half the plate) and fruits -5-7 servings of vegetables and fruits per day (fresh or steamed is best), exp. 2 servings of vegetables with lunch and dinner and 2 servings of fruit per day. Berries and greens such as kale and collards are great choices.  -consume on a regular basis: whole grains (make sure first ingredient on label contains the word "whole"), fresh fruits, fish, nuts, seeds, healthy oils (such as olive oil, avocado oil, grape seed oil) -may eat small amounts of dairy and lean meat on occasion, but avoid processed meats such as ham, bacon, lunch meat, etc. -drink water -try to avoid fast food and pre-packaged foods, processed meat -most experts advise limiting sodium to < 2300mg  per day, should limit further is any chronic conditions such as high blood pressure, heart disease, diabetes, etc. The American Heart Association advised that < 1500mg  is is ideal -try to avoid foods that contain any ingredients with names you do not recognize  -try to avoid sugar/sweets (except for the natural sugar that occurs in fresh fruit) -try to avoid sweet drinks -try to avoid white rice, white bread, pasta (unless whole grain), white or yellow potatoes  EXERCISE GUIDELINES FOR ADULTS: -if you wish to increase your physical activity, do so gradually and with the approval of your doctor -STOP and seek medical care immediately if you have any chest pain, chest discomfort or trouble breathing when starting or increasing exercise  -move and stretch your body, legs, feet and arms when sitting for long periods -Physical activity guidelines for optimal health in adults: -least 150 minutes per week of aerobic exercise (can talk, but not sing) once approved by your doctor, 20-30 minutes of sustained activity or two 10 minute episodes of sustained activity every day.  -resistance training  at least 2 days per week if approved by your doctor -balance exercises 3+ days per week:   Stand somewhere where  you have something sturdy to hold onto if you lose balance.    1) lift up on toes, start with 5x per day and work up to 20x   2) stand and lift on leg straight out to the side so that foot is a few inches of the floor, start with 5x each side and work up to 20x each side   3) stand on one foot, start with 5 seconds each side and work up to 20 seconds on each side  If you need ideas or help with getting more active:  -Silver sneakers https://tools.silversneakers.com  -Walk with a Doc: http://www.duncan-williams.com/  -try to include resistance (weight lifting/strength building) and balance exercises twice per week: or the following link for ideas: http://castillo-powell.com/  BuyDucts.dk  STRESS MANAGEMENT: -can try meditating, or just sitting quietly with deep breathing while intentionally relaxing all parts of your body for 5 minutes daily -if you need further help with stress, anxiety or depression please follow up with your primary doctor or contact the wonderful folks at [facility]: [phone]  SOCIAL CONNECTIONS: -options in [place] if you wish to engage in more social and exercise related activities:  -Silver sneakers https://tools.silversneakers.com  -Walk with a Doc: http://www.duncan-williams.com/  -Check out the [facility] Active Adults 50+ section on the [place] of [employer] (hiking clubs, book clubs, cards and games, chess, exercise classes, aquatic classes and much more) - see the website for details: https://www.[place].gov/departments/parks-recreation/active-adults50  -YouTube has lots of exercise videos for different ages and abilities as well  -[facility] (a variety of indoor and outdoor inperson activities for adults). [phone]. [address].  -Virtual Online Classes (a variety of topics): see seniorplanet.org or call  [phone]  -consider volunteering at a school, hospice center, church, senior center or elsewhere    ADVANCED HEALTHCARE DIRECTIVES:  [place] Advanced Directives assistance:   ExpressWeek.com.cy  Everyone should have advanced health care directives in place. This is so that you get the care you want, should you ever be in a situation where you are unable to make your own medical decisions.   From the [place] Advanced Directive Website: "Advance Health Care Directives are legal documents in which you give written instructions about your health care if, in the future, you cannot speak for yourself.   A health care power of attorney allows you to name a person you trust to make your health care decisions if you cannot make them yourself. A declaration of a desire for a natural death (or living will) is document, which states that you desire not to have your life prolonged by extraordinary measures if you have a terminal or incurable illness or if you are in a vegetative state. An advance instruction for mental health treatment makes a declaration of instructions, information and preferences regarding your mental health treatment. It also states that you are aware that the advance instruction authorizes a mental health treatment provider to act according to your wishes. It may also outline your consent or refusal of mental health treatment. A declaration of an anatomical gift allows anyone over the age of 74 to make a gift by will, organ donor card or other document."   Please see the following website or an elder law attorney for forms, FAQs and for completion of advanced directives: [place] TEFL teacher Health Care Directives Advance Health Care  Directives (http://guzman.com/)  Or copy and paste the following to your web browser: PoshChat.fi

## 2023-08-19 NOTE — Progress Notes (Signed)
PATIENT CHECK-IN and HEALTH RISK ASSESSMENT QUESTIONNAIRE:  -completed by phone/video for upcoming Medicare Preventive Visit  Pre-Visit Check-in: 1)Vitals (height, wt, BP, etc) - record in vitals section for visit on day of visit Request home vitals (wt, BP, etc.) and enter into vitals, THEN update Vital Signs SmartPhrase below at the top of the HPI. See below.  2)Review and Update Medications, Allergies PMH, Surgeries, Social history in Epic 3)Hospitalizations in the last year with date/reason? Yes, 11/21/22 pruritus  4)Review and Update Care Team (patient's specialists) in Epic 5) Complete PHQ9 in Epic  6) Complete Fall Screening in Epic 7)Review all Health Maintenance Due and order under PCP if not done.  8)Medicare Wellness Questionnaire: Answer theses question about your habits: Do you drink alcohol? No If yes, how many drinks do you have a day? N/A Have you ever smoked? No  Quit date if applicable? N?A  How many packs a day do/did you smoke? N/A  Do you use smokeless tobacco? No Do you use an illicit drugs? No Do you exercise? Yes IF so, what type and how many days/minutes per week? Walking daily, 10-15 minutes Are you sexually active? No Number of partners? N/A Typical breakfast: grits and eggs Typical lunch: fruit Typical dinner: pork chop or chicken with sweet potato or mixed vegetables Typical snacks: tangerine or grapes  Beverages: water  Answer theses question about you: Can you perform most household chores? Yes Do you find it hard to follow a conversation in a noisy room? Not really - feels like hearing is ok and doesn't feel needs evaluation Do you often ask people to speak up or repeat themselves? Sometimes Do you feel that you have a problem with memory? Sometimes Do you balance your checkbook and or bank acounts? Yes Do you feel safe at home? Yes Last dentist visit? 4-5 years ago Do you need assistance with any of the following: Please note if so  Yes/No  Driving? Sometimes  Feeding yourself? /No  Getting from bed to chair? No  Getting to the toilet? No  Bathing or showering? No  Dressing yourself? No  Managing money? No  Climbing a flight of stairs? No  Preparing meals? /No  Do you have Advanced Directives in place (Living Will, Healthcare Power of Attorney)?     No   Last eye Exam and location? [employer], 06/21/2020 - she plans schedule   Do you currently use prescribed or non-prescribed narcotic or opioid pain medications? No  Do you have a history or close family history of breast, ovarian, tubal or peritoneal cancer or a family member with BRCA (breast cancer susceptibility 1 and 2) gene mutations? No  Request home vitals (wt, BP, etc.) and enter into vitals, THEN update Vital Signs SmartPhrase below at the top of the HPI. See below.   Nurse/Assistant Credentials/time stamp: BDS 08/19/23 3:39PM   ----------------------------------------------------------------------------------------------------------------------------------------------------------------------------------------------------------------------  Because this visit was a virtual/telehealth visit, some criteria may be missing or patient reported. Any vitals not documented were not able to be obtained and vitals that have been documented are patient reported.    MEDICARE ANNUAL PREVENTIVE VISIT WITH PROVIDER: (Welcome to Medicare, initial annual wellness or annual wellness exam)  Virtual Visit via Phone Note  I connected with Pam Obrien on 08/19/23 by phone and verified that I am speaking with the correct person using two identifiers.  Location patient: home Location provider:work or home office Persons participating in the virtual visit: patient, provider  Concerns and/or follow up today: no  concerns   See HM section in Epic for other details of completed HM.    ROS: negative for report of fevers, unintentional weight loss,  vision changes, vision loss, hearing loss or change, chest pain, sob, hemoptysis, melena, hematochezia, hematuria, falls, bleeding or bruising, thoughts of suicide or self harm, memory loss  Patient-completed extensive health risk assessment - reviewed and discussed with the patient: See Health Risk Assessment completed with patient prior to the visit either above or in recent phone note. This was reviewed in detailed with the patient today and appropriate recommendations, orders and referrals were placed as needed per Summary below and patient instructions.   Review of Medical History: -PMH, PSH, Family History and current specialty and care providers reviewed and updated and listed below   Patient Care Team: Shirline Frees, NP as PCP - General (Family Medicine)   Past Medical History:  Diagnosis Date   Allergy    Anemia    as a young adult   Arthritis    Diabetes mellitus without complication (HCC)    Hyperlipidemia    was normal range 05/2017 with PCP   Hypertension     Past Surgical History:  Procedure Laterality Date   ABDOMINAL HYSTERECTOMY  1994   COLONOSCOPY  2008   FLEXIBLE SIGMOIDOSCOPY  2003   MASS EXCISION Left 09/06/2022   Procedure: EXCISION MASS LEFT HAND;  Surgeon: Betha Loa, MD;  Location: [facility];  Service: Orthopedics;  Laterality: Left;   POLYPECTOMY      Social History   Socioeconomic History   Marital status: Married    Spouse name: Not on file   Number of children: Not on file   Years of education: Not on file   Highest education level: Not on file  Occupational History   Not on file  Tobacco Use   Smoking status: Never   Smokeless tobacco: Never  Vaping Use   Vaping status: Never Used  Substance and Sexual Activity   Alcohol use: No   Drug use: No   Sexual activity: Not on file  Other Topics Concern   Not on file  Social History Narrative   Retired from [employer] work    Separated for 9 years.    Five children, two  live locally, three are in [place]   One of her daughters live with her   Has a dog.    She likes to dance and walk   Social Determinants of Health   Financial Resource Strain: Low Risk  (08/14/2022)   Overall Financial Resource Strain (CARDIA)    Difficulty of Paying Living Expenses: Not hard at all  Food Insecurity: No Food Insecurity (08/14/2022)   Hunger Vital Sign    Worried About Running Out of Food in the Last Year: Never true    Ran Out of Food in the Last Year: Never true  Transportation Needs: No Transportation Needs (08/14/2022)   PRAPARE - Administrator, Civil Service (Medical): No    Lack of Transportation (Non-Medical): No  Physical Activity: Insufficiently Active (08/14/2022)   Exercise Vital Sign    Days of Exercise per Week: 7 days    Minutes of Exercise per Session: 20 min  Stress: No Stress Concern Present (08/14/2022)   [employer] of Occupational Health - Occupational Stress Questionnaire    Feeling of Stress : Not at all  Social Connections: Moderately Integrated (08/14/2022)   Social Connection and Isolation Panel [NHANES]    Frequency of Communication with  Friends and Family: More than three times a week    Frequency of Social Gatherings with Friends and Family: More than three times a week    Attends Religious Services: More than 4 times per year    Active Member of [employer] or Organizations: Yes    Attends Engineer, structural: More than 4 times per year    Marital Status: Divorced  Intimate Partner Violence: Not At Risk (08/14/2022)   Humiliation, Afraid, Rape, and Kick questionnaire    Fear of Current or Ex-Partner: No    Emotionally Abused: No    Physically Abused: No    Sexually Abused: No    Family History  Problem Relation Age of Onset   Hypertension Sister    Stroke Other    Diabetes Father    Hypertension Father    Stroke Father    Diabetes Sister    Diabetes Brother    Hypertension Brother    Stroke Brother     Cancer Mother    Esophageal cancer Neg Hx    Rectal cancer Neg Hx    Stomach cancer Neg Hx    Colon cancer Neg Hx     Current Outpatient Medications on File Prior to Visit  Medication Sig Dispense Refill   ACCU-CHEK GUIDE test strip USE AS INSTRUCTED TO CHECK BLOOD SUGAR ONCE DAILY. 100 strip 2   Accu-Chek Softclix Lancets lancets USE AS DIRECTED EVERY DAY 100 each 1   acetaminophen (TYLENOL) 500 MG tablet Take 1 tablet (500 mg total) by mouth every 6 (six) hours as needed. 30 tablet 0   atenolol-chlorthalidone (TENORETIC) 50-25 MG tablet TAKE 1/2 TABLET BY MOUTH DAILY 45 tablet 2   Blood Glucose Monitoring Suppl (ACCU-CHEK AVIVA PLUS) w/Device KIT Use Accu Chek Aviva Plus to check blood sugar once daily. 1 kit 0   calcium citrate-vitamin D (CITRACAL+D) 315-200 MG-UNIT per tablet Take 1 tablet by mouth daily.     cholecalciferol (VITAMIN D) 1000 UNITS tablet Take 2,000 Units by mouth daily.     clotrimazole-betamethasone (LOTRISONE) cream Apply to affected area 2 times daily prn 15 g 0   FeFum-FePo-FA-B Cmp-C-Zn-Mn-Cu (SE-TAN PLUS) 162-115.2-1 MG CAPS TAKE 1 CAPSULE BY MOUTH EVERY DAY 90 capsule 3   gabapentin (NEURONTIN) 100 MG capsule Take 1 capsule (100 mg total) by mouth 3 (three) times daily. 90 capsule 3   glimepiride (AMARYL) 2 MG tablet TAKE 1 TABLET (2 MG TOTAL) BY MOUTH DAILY WITH SUPPER. 90 tablet 0   Lancets Misc. (ACCU-CHEK FASTCLIX LANCET) KIT by Does not apply route. USE LANCET TO CHECK BLOOD SUGAR ONCE DAILY.     Lidocaine 0.5 % GEL Apply 1 Application topically 2 (two) times daily as needed. 170 g 0   metFORMIN (GLUCOPHAGE-XR) 500 MG 24 hr tablet TAKE 2 TABLETS BY MOUTH EVERY DAY WITH SUPPER 180 tablet 1   omeprazole (PRILOSEC) 20 MG capsule Take 20 mg by mouth daily.     pravastatin (PRAVACHOL) 10 MG tablet TAKE 1 TABLET BY MOUTH EVERY DAY 90 tablet 0   spironolactone (ALDACTONE) 50 MG tablet TAKE 1 TABLET BY MOUTH EVERY DAY 90 tablet 0   No current facility-administered  medications on file prior to visit.    Allergies  Allergen Reactions   Aspirin     REACTION: nervous   Beef-Derived Products     Sneezing, nasal drainage, throat scratchy   Celecoxib     REACTION: joints swell   Milk-Related Compounds     itching  Mold Extract [Trichophyton]     Pt does not know the reaction.  Positive allergy test per pt.   Nsaids     REACTION: sweling   Peanuts [Peanut Oil]     Throat feels scratchy   Shellfish Allergy     Nasal congestion, throat scratchy, eyes watery   Sulfa Antibiotics     Other Reaction(s): RASH-HIVES   Sulfamethoxazole     REACTION: urticaria (hives)   Vioxx [Rofecoxib] Swelling       Physical Exam Vitals requested from patient and listed below if patient had equipment and was able to obtain at home for this virtual visit: There were no vitals filed for this visit. Estimated body mass index is 28.87 kg/m as calculated from the following:   Height as of 07/28/23: 5\' 1"  (1.549 m).   Weight as of 07/28/23: 152 lb 12.8 oz (69.3 kg).  EKG (optional): deferred due to virtual visit  GENERAL: alert, oriented, no acute distress detected, full vision exam deferred due to pandemic and/or virtual encounter  PSYCH/NEURO: pleasant and cooperative, no obvious depression or anxiety, speech and thought processing grossly intact, Cognitive function grossly intact  Flowsheet Row Office Visit from 12/10/2022 in [facility] [place]  PHQ-9 Total Score 0           08/19/2023    3:37 PM 12/10/2022   10:29 AM 08/14/2022    1:46 PM 07/05/2022   10:19 AM 07/12/2021   11:35 AM  Depression screen PHQ 2/9  Decreased Interest 0 0 0 0 0  Down, Depressed, Hopeless 0 0 0 0 0  PHQ - 2 Score 0 0 0 0 0  Altered sleeping  0     Tired, decreased energy  0     Change in appetite  0     Feeling bad or failure about yourself   0     Trouble concentrating  0     Moving slowly or fidgety/restless  0     Suicidal thoughts  0     PHQ-9  Score  0     Difficult doing work/chores  Not difficult at all          09/05/2021   10:16 AM 08/14/2022    1:50 PM 11/11/2022    3:53 PM 11/21/2022    7:32 PM 08/19/2023    3:36 PM  Fall Risk  Falls in the past year? 0 1   0  Was there an injury with Fall? 0 0   0  Was there an injury with Fall? - Comments  No injury or medical attention needed     Fall Risk Category Calculator 0 1   0  Fall Risk Category (Retired) Low Low     (RETIRED) Patient Fall Risk Level Low fall risk Low fall risk Low fall risk Low fall risk   Patient at Risk for Falls Due to  No Fall Risks   No Fall Risks  Fall risk Follow up  Falls prevention discussed   Falls evaluation completed     SUMMARY AND PLAN:  Encounter for Medicare annual wellness exam   Discussed applicable health maintenance/preventive health measures and advised and referred or ordered per patient preferences: -declines vaccines as has had side effects in the past Health Maintenance  Topic Date Due   COVID-19 Vaccine (1 - 2023-24 season) 09/04/2023 (Originally 07/13/2023)   Zoster Vaccines- Shingrix (1 of 2) 11/19/2023 (Originally 11/20/1994)   OPHTHALMOLOGY EXAM  12/09/2023 (Originally 06/21/2021)  INFLUENZA VACCINE  02/09/2024 (Originally 06/12/2023)   DTaP/Tdap/Td (2 - Tdap) 08/18/2024 (Originally 11/11/2016)   HEMOGLOBIN A1C  01/25/2024   Diabetic kidney evaluation - eGFR measurement  03/18/2024   Diabetic kidney evaluation - Urine ACR  03/18/2024   FOOT EXAM  03/18/2024   Medicare Annual Wellness (AWV)  08/18/2024   Pneumonia Vaccine 27+ Years old  Completed   DEXA SCAN  Completed   Hepatitis C Screening  Completed   HPV VACCINES  Aged Out   Colonoscopy  Discontinued      Education and counseling on the following was provided based on the above review of health and a plan/checklist for the patient, along with additional information discussed, was provided for the patient in the patient instructions :  -Advised on importance of  completing advanced directives, discussed options for completing and provided information in patient instructions as well -She did not feel needed audiology eval at this time -Provided counseling and plan for increased risk of falling if applicable per above screening. Reviewed and safe balance exercises that can be done at home to improve balance and discussed exercise guidelines for adults with include balance exercises at least 3 days per week.  -Advised and counseled on a healthy lifestyle - including the importance of a healthy diet, regular physical activity, social connections and stress management. -Reviewed patient's current diet. Advised and counseled on a whole foods based healthy diet. A summary of a healthy diet was provided in the Patient Instructions.  -reviewed patient's current physical activity level and discussed exercise guidelines for adults. Discussed community resources and ideas for safe exercise at home to assist in meeting exercise guideline recommendations in a safe and healthy way.  -Advise yearly dental visits at minimum and regular eye exams   Follow up: see patient instructions     Patient Instructions  I really enjoyed getting to talk with you today! I am available on Tuesdays and Thursdays for virtual visits if you have any questions or concerns, or if I can be of any further assistance.   CHECKLIST FROM ANNUAL WELLNESS VISIT:  -Follow up (please call to schedule if not scheduled after visit):   -yearly for annual wellness visit with primary care office  Here is a list of your preventive care/health maintenance measures and the plan for each if any are due:  PLAN For any measures below that may be due:   Health Maintenance  Topic Date Due   COVID-19 Vaccine (1 - 2023-24 season) 09/04/2023 (Originally 07/13/2023)   Zoster Vaccines- Shingrix (1 of 2) 11/19/2023 (Originally 11/20/1994)   OPHTHALMOLOGY EXAM  12/09/2023 (Originally 06/21/2021)   INFLUENZA  VACCINE  02/09/2024 (Originally 06/12/2023)   DTaP/Tdap/Td (2 - Tdap) 08/18/2024 (Originally 11/11/2016)   HEMOGLOBIN A1C  01/25/2024   Diabetic kidney evaluation - eGFR measurement  03/18/2024   Diabetic kidney evaluation - Urine ACR  03/18/2024   FOOT EXAM  03/18/2024   Medicare Annual Wellness (AWV)  08/18/2024   Pneumonia Vaccine 89+ Years old  Completed   DEXA SCAN  Completed   Hepatitis C Screening  Completed   HPV VACCINES  Aged Out   Colonoscopy  Discontinued    -See a dentist at least yearly  -Get your eyes checked and then per your eye specialist's recommendations  -Other issues addressed today:   -I have included below further information regarding a healthy whole foods based diet, physical activity guidelines for adults, stress management and opportunities for social connections. I hope you find this information  useful.   -----------------------------------------------------------------------------------------------------------------------------------------------------------------------------------------------------------------------------------------------------------  NUTRITION: -eat real food: lots of colorful vegetables (half the plate) and fruits -5-7 servings of vegetables and fruits per day (fresh or steamed is best), exp. 2 servings of vegetables with lunch and dinner and 2 servings of fruit per day. Berries and greens such as kale and collards are great choices.  -consume on a regular basis: whole grains (make sure first ingredient on label contains the word "whole"), fresh fruits, fish, nuts, seeds, healthy oils (such as olive oil, avocado oil, grape seed oil) -may eat small amounts of dairy and lean meat on occasion, but avoid processed meats such as ham, bacon, lunch meat, etc. -drink water -try to avoid fast food and pre-packaged foods, processed meat -most experts advise limiting sodium to < 2300mg  per day, should limit further is any chronic conditions such as  high blood pressure, heart disease, diabetes, etc. The American Heart Association advised that < 1500mg  is is ideal -try to avoid foods that contain any ingredients with names you do not recognize  -try to avoid sugar/sweets (except for the natural sugar that occurs in fresh fruit) -try to avoid sweet drinks -try to avoid white rice, white bread, pasta (unless whole grain), white or yellow potatoes  EXERCISE GUIDELINES FOR ADULTS: -if you wish to increase your physical activity, do so gradually and with the approval of your doctor -STOP and seek medical care immediately if you have any chest pain, chest discomfort or trouble breathing when starting or increasing exercise  -move and stretch your body, legs, feet and arms when sitting for long periods -Physical activity guidelines for optimal health in adults: -least 150 minutes per week of aerobic exercise (can talk, but not sing) once approved by your doctor, 20-30 minutes of sustained activity or two 10 minute episodes of sustained activity every day.  -resistance training at least 2 days per week if approved by your doctor -balance exercises 3+ days per week:   Stand somewhere where you have something sturdy to hold onto if you lose balance.    1) lift up on toes, start with 5x per day and work up to 20x   2) stand and lift on leg straight out to the side so that foot is a few inches of the floor, start with 5x each side and work up to 20x each side   3) stand on one foot, start with 5 seconds each side and work up to 20 seconds on each side  If you need ideas or help with getting more active:  -Silver sneakers https://tools.silversneakers.com  -Walk with a Doc: http://www.duncan-williams.com/  -try to include resistance (weight lifting/strength building) and balance exercises twice per week: or the following link for  ideas: http://castillo-powell.com/  BuyDucts.dk  STRESS MANAGEMENT: -can try meditating, or just sitting quietly with deep breathing while intentionally relaxing all parts of your body for 5 minutes daily -if you need further help with stress, anxiety or depression please follow up with your primary doctor or contact the wonderful folks at [facility]: [phone]  SOCIAL CONNECTIONS: -options in [place] if you wish to engage in more social and exercise related activities:  -Silver sneakers https://tools.silversneakers.com  -Walk with a Doc: http://www.duncan-williams.com/  -Check out the [facility] Active Adults 50+ section on the [place] of [employer] (hiking clubs, book clubs, cards and games, chess, exercise classes, aquatic classes and much more) - see the website for details: https://www.[place].gov/departments/parks-recreation/active-adults50  -YouTube has lots of exercise videos for different ages and abilities as  well  -[facility] (a variety of indoor and outdoor inperson activities for adults). 825-007-8933. [address]  -Virtual Online Classes (a variety of topics): see seniorplanet.org or call [phone]  -consider volunteering at a school, hospice center, church, senior center or elsewhere    ADVANCED HEALTHCARE DIRECTIVES:  [place] Advanced Directives assistance:   ExpressWeek.com.cy  Everyone should have advanced health care directives in place. This is so that you get the care you want, should you ever be in a situation where you are unable to make your own medical decisions.   From the [place] Advanced Directive Website: "Advance Health Care Directives are legal documents in which you give written instructions about your health care if, in the future, you cannot speak for  yourself.   A health care power of attorney allows you to name a person you trust to make your health care decisions if you cannot make them yourself. A declaration of a desire for a natural death (or living will) is document, which states that you desire not to have your life prolonged by extraordinary measures if you have a terminal or incurable illness or if you are in a vegetative state. An advance instruction for mental health treatment makes a declaration of instructions, information and preferences regarding your mental health treatment. It also states that you are aware that the advance instruction authorizes a mental health treatment provider to act according to your wishes. It may also outline your consent or refusal of mental health treatment. A declaration of an anatomical gift allows anyone over the age of 75 to make a gift by will, organ donor card or other document."   Please see the following website or an elder law attorney for forms, FAQs and for completion of advanced directives: [place] TEFL teacher Health Care Directives Advance Health Care Directives (http://guzman.com/)  Or copy and paste the following to your web browser: PoshChat.fi    Terressa Koyanagi, DO

## 2023-12-01 ENCOUNTER — Ambulatory Visit: Payer: Medicare (Managed Care) | Admitting: Endocrinology

## 2023-12-16 ENCOUNTER — Encounter: Payer: Self-pay | Admitting: Adult Health

## 2023-12-16 ENCOUNTER — Ambulatory Visit (INDEPENDENT_AMBULATORY_CARE_PROVIDER_SITE_OTHER): Payer: Medicare (Managed Care) | Admitting: Adult Health

## 2023-12-16 VITALS — BP 138/82 | HR 73 | Temp 98.1°F | Ht 61.0 in | Wt 149.0 lb

## 2023-12-16 DIAGNOSIS — I10 Essential (primary) hypertension: Secondary | ICD-10-CM

## 2023-12-16 DIAGNOSIS — K219 Gastro-esophageal reflux disease without esophagitis: Secondary | ICD-10-CM

## 2023-12-16 DIAGNOSIS — Z7984 Long term (current) use of oral hypoglycemic drugs: Secondary | ICD-10-CM | POA: Diagnosis not present

## 2023-12-16 DIAGNOSIS — E785 Hyperlipidemia, unspecified: Secondary | ICD-10-CM

## 2023-12-16 DIAGNOSIS — E119 Type 2 diabetes mellitus without complications: Secondary | ICD-10-CM

## 2023-12-16 DIAGNOSIS — Z0000 Encounter for general adult medical examination without abnormal findings: Secondary | ICD-10-CM

## 2023-12-16 NOTE — Patient Instructions (Addendum)
I am going to have you come back one morning and do the blood work. We will follow up with you regarding this when he get it back   Let me know if you need anything

## 2023-12-16 NOTE — Progress Notes (Signed)
 Subjective:    Patient ID: Pam Obrien, female    DOB: [birth year], 78 y.o.   MRN: [id number]  HPI Patient presents for yearly preventative medicine examination. She is a pleasant 78 year old female who  has a past medical history of Allergy, Anemia, Arthritis, Diabetes mellitus without complication (HCC), Hyperlipidemia, and Hypertension.  DM type II-is followed by endocrinology.  Managed with Metformin  1000 mg BID and Amaryl  4 mg. She has not checking her blood sugars at home.  Lab Results  Component Value Date   HGBA1C 7.6 (A) 07/28/2023   HGBA1C 6.5 (A) 03/19/2023   HGBA1C 7.4 (H) 06/10/2022   Hypertension-controlled with atenolol /hydrochlorothiazide  25-12.5 mg and spirloactone 50 mg daily. .  She denies dizziness, lightheadedness, chest pain, or syncopal episodes.  BP Readings from Last 3 Encounters:  12/16/23 138/82  07/28/23 (!) 140/80  03/25/23 (!) 140/80   Hyperlipidemia -prescribed simvastatin  20 mg by endocrinology.  He denies myalgia or fatigue Lab Results  Component Value Date   CHOL 218 (H) 03/19/2023   HDL 53.40 03/19/2023   LDLCALC 144 (H) 03/19/2023   LDLDIRECT 98.0 12/02/2019   TRIG 104.0 03/19/2023   CHOLHDL 4 03/19/2023   GERD - Controlled with Prilosec 20 mg   Chronic left knee pain - had xray in 2023 which showed Severe tricompartmental degenerative changes. Small loose bodies in the joint. No other abnormalities. We did a steroid injection which did not help. She does not want to see orthopedics at this time   All immunizations and health maintenance protocols were reviewed with the patient and needed orders were placed. Advised shingles  TDAP at pharmacy.   Appropriate screening laboratory values were ordered for the patient including screening of hyperlipidemia, renal function and hepatic function.  Medication reconciliation,  past medical history, social history, problem list and allergies were reviewed in detail with the patient  Goals were  established with regard to weight loss, exercise, and  diet in compliance with medications Wt Readings from Last 3 Encounters:  12/16/23 149 lb (67.6 kg)  07/28/23 152 lb 12.8 oz (69.3 kg)  03/25/23 154 lb (69.9 kg)    Review of Systems  Constitutional: Negative.   HENT: Negative.    Eyes: Negative.   Respiratory: Negative.    Cardiovascular: Negative.   Gastrointestinal: Negative.   Endocrine: Negative.   Genitourinary: Negative.   Musculoskeletal:  Positive for arthralgias.  Skin: Negative.   Allergic/Immunologic: Negative.   Neurological: Negative.   Hematological: Negative.   Psychiatric/Behavioral: Negative.     Past Medical History:  Diagnosis Date   Allergy    Anemia    as a young adult   Arthritis    Diabetes mellitus without complication (HCC)    Hyperlipidemia    was normal range 05/2017 with PCP   Hypertension     Social History   Socioeconomic History   Marital status: Married    Spouse name: Not on file   Number of children: Not on file   Years of education: Not on file   Highest education level: Not on file  Occupational History   Not on file  Tobacco Use   Smoking status: Never   Smokeless tobacco: Never  Vaping Use   Vaping status: Never Used  Substance and Sexual Activity   Alcohol use: No   Drug use: No   Sexual activity: Not on file  Other Topics Concern   Not on file  Social History Narrative   Retired from [employer] work  Separated for 9 years.    Five children, two live locally, three are in [place]    One of her daughters live with her   Has a dog.    She likes to dance and walk   Social Drivers of Health   Financial Resource Strain: Low Risk  (08/14/2022)   Overall Financial Resource Strain (CARDIA)    Difficulty of Paying Living Expenses: Not hard at all  Food Insecurity: No Food Insecurity (08/14/2022)   Hunger Vital Sign    Worried About Running Out of Food in the Last Year: Never true    Ran Out of Food in the Last Year:  Never true  Transportation Needs: No Transportation Needs (08/14/2022)   PRAPARE - Administrator, Civil Service (Medical): No    Lack of Transportation (Non-Medical): No  Physical Activity: Insufficiently Active (08/14/2022)   Exercise Vital Sign    Days of Exercise per Week: 7 days    Minutes of Exercise per Session: 20 min  Stress: No Stress Concern Present (08/14/2022)   [employer] of Occupational Health - Occupational Stress Questionnaire    Feeling of Stress : Not at all  Social Connections: Moderately Integrated (08/14/2022)   Social Connection and Isolation Panel [NHANES]    Frequency of Communication with Friends and Family: More than three times a week    Frequency of Social Gatherings with Friends and Family: More than three times a week    Attends Religious Services: More than 4 times per year    Active Member of Clubs or Organizations: Yes    Attends Banker Meetings: More than 4 times per year    Marital Status: Divorced  Intimate Partner Violence: Not At Risk (08/14/2022)   Humiliation, Afraid, Rape, and Kick questionnaire    Fear of Current or Ex-Partner: No    Emotionally Abused: No    Physically Abused: No    Sexually Abused: No    Past Surgical History:  Procedure Laterality Date   ABDOMINAL HYSTERECTOMY  1994   COLONOSCOPY  2008   FLEXIBLE SIGMOIDOSCOPY  2003   MASS EXCISION Left 09/06/2022   Procedure: EXCISION MASS LEFT HAND;  Surgeon: Murrell Drivers, MD;  Location: [facility];  Service: Orthopedics;  Laterality: Left;   POLYPECTOMY      Family History  Problem Relation Age of Onset   Hypertension Sister    Stroke Other    Diabetes Father    Hypertension Father    Stroke Father    Diabetes Sister    Diabetes Brother    Hypertension Brother    Stroke Brother    Cancer Mother    Esophageal cancer Neg Hx    Rectal cancer Neg Hx    Stomach cancer Neg Hx    Colon cancer Neg Hx     Allergies  Allergen  Reactions   Aspirin     REACTION: nervous   Beef-Derived Drug Products     Sneezing, nasal drainage, throat scratchy   Celecoxib     REACTION: joints swell   Milk-Related Compounds     itching   Mold Extract [Trichophyton]     Pt does not know the reaction.  Positive allergy test per pt.   Nsaids     REACTION: sweling   Peanuts [Peanut Oil]     Throat feels scratchy   Shellfish Allergy     Nasal congestion, throat scratchy, eyes watery   Sulfa Antibiotics     Other Reaction(s):  RASH-HIVES   Sulfamethoxazole     REACTION: urticaria (hives)   Vioxx [Rofecoxib] Swelling    Current Outpatient Medications on File Prior to Visit  Medication Sig Dispense Refill   ACCU-CHEK GUIDE test strip USE AS INSTRUCTED TO CHECK BLOOD SUGAR ONCE DAILY. 100 strip 2   Accu-Chek Softclix Lancets lancets USE AS DIRECTED EVERY DAY 100 each 1   acetaminophen  (TYLENOL ) 500 MG tablet Take 1 tablet (500 mg total) by mouth every 6 (six) hours as needed. 30 tablet 0   atenolol -chlorthalidone  (TENORETIC ) 50-25 MG tablet TAKE 1/2 TABLET BY MOUTH DAILY 45 tablet 2   Blood Glucose Monitoring Suppl (ACCU-CHEK AVIVA PLUS) w/Device KIT Use Accu Chek Aviva Plus to check blood sugar once daily. 1 kit 0   calcium  citrate-vitamin D  (CITRACAL+D) 315-200 MG-UNIT per tablet Take 1 tablet by mouth daily.     cholecalciferol (VITAMIN D ) 1000 UNITS tablet Take 2,000 Units by mouth daily.     clotrimazole -betamethasone  (LOTRISONE ) cream Apply to affected area 2 times daily prn 15 g 0   FeFum-FePo-FA-B Cmp-C-Zn-Mn-Cu (SE-TAN PLUS ) 162-115.2-1 MG CAPS TAKE 1 CAPSULE BY MOUTH EVERY DAY 90 capsule 3   gabapentin  (NEURONTIN ) 100 MG capsule Take 1 capsule (100 mg total) by mouth 3 (three) times daily. 90 capsule 3   glimepiride  (AMARYL ) 2 MG tablet TAKE 1 TABLET (2 MG TOTAL) BY MOUTH DAILY WITH SUPPER. 90 tablet 0   Lancets Misc. (ACCU-CHEK FASTCLIX LANCET) KIT by Does not apply route. USE LANCET TO CHECK BLOOD SUGAR ONCE DAILY.      Lidocaine  0.5 % GEL Apply 1 Application topically 2 (two) times daily as needed. 170 g 0   metFORMIN  (GLUCOPHAGE -XR) 500 MG 24 hr tablet TAKE 2 TABLETS BY MOUTH EVERY DAY WITH SUPPER 180 tablet 1   omeprazole (PRILOSEC) 20 MG capsule Take 20 mg by mouth daily.     pravastatin  (PRAVACHOL ) 10 MG tablet TAKE 1 TABLET BY MOUTH EVERY DAY 90 tablet 0   spironolactone  (ALDACTONE ) 50 MG tablet TAKE 1 TABLET BY MOUTH EVERY DAY 90 tablet 0   No current facility-administered medications on file prior to visit.    BP 138/82   Pulse 73   Temp 98.1 F (36.7 C) (Oral)   Ht 5' 1 (1.549 m)   Wt 149 lb (67.6 kg)   SpO2 98%   BMI 28.15 kg/m       Objective:   Physical Exam Vitals and nursing note reviewed.  Constitutional:      General: She is not in acute distress.    Appearance: Normal appearance. She is not ill-appearing.  HENT:     Head: Normocephalic and atraumatic.     Right Ear: Tympanic membrane, ear canal and external ear normal. There is no impacted cerumen.     Left Ear: Tympanic membrane, ear canal and external ear normal. There is no impacted cerumen.     Nose: Nose normal. No congestion or rhinorrhea.     Mouth/Throat:     Mouth: Mucous membranes are moist.     Pharynx: Oropharynx is clear.  Eyes:     Extraocular Movements: Extraocular movements intact.     Conjunctiva/sclera: Conjunctivae normal.     Pupils: Pupils are equal, round, and reactive to light.  Neck:     Vascular: No carotid bruit.  Cardiovascular:     Rate and Rhythm: Normal rate and regular rhythm.     Pulses: Normal pulses.     Heart sounds: No murmur heard.  No friction rub. No gallop.  Pulmonary:     Effort: Pulmonary effort is normal.     Breath sounds: Normal breath sounds.  Abdominal:     General: Abdomen is flat. Bowel sounds are normal. There is no distension.     Palpations: Abdomen is soft. There is no mass.     Tenderness: There is no abdominal tenderness. There is no guarding or rebound.      Hernia: No hernia is present.  Musculoskeletal:        General: Normal range of motion.     Cervical back: Normal range of motion and neck supple.  Lymphadenopathy:     Cervical: No cervical adenopathy.  Skin:    General: Skin is warm and dry.     Capillary Refill: Capillary refill takes less than 2 seconds.  Neurological:     General: No focal deficit present.     Mental Status: She is alert and oriented to person, place, and time.  Psychiatric:        Mood and Affect: Mood normal.        Behavior: Behavior normal.        Thought Content: Thought content normal.        Judgment: Judgment normal.           Assessment & Plan:  1. Routine general medical examination at a health care facility (Primary) Today patient counseled on age appropriate routine health concerns for screening and prevention, each reviewed and up to date or declined. Immunizations reviewed and up to date or declined. Labs ordered and reviewed. Risk factors for depression reviewed and negative. Hearing function and visual acuity are intact. ADLs screened and addressed as needed. Functional ability and level of safety reviewed and appropriate. Education, counseling and referrals performed based on assessed risks today. Patient provided with a copy of personalized plan for preventive services. -Follow up in one year or sooner if needed   2. Diabetes mellitus treated with oral medication (HCC) - Per endocrinology  - CBC with Differential/Platelet; Future - Comprehensive metabolic panel; Future - Lipid panel; Future - TSH; Future  3. Essential hypertension - Controlled. No change in medication  - CBC with Differential/Platelet; Future - Comprehensive metabolic panel; Future - Lipid panel; Future - TSH; Future  4. Hyperlipidemia, unspecified hyperlipidemia type - Consider increase in statin  - CBC with Differential/Platelet; Future - Comprehensive metabolic panel; Future - Lipid panel; Future - TSH;  Future  5. Gastroesophageal reflux disease without esophagitis - Continue PPI  - CBC with Differential/Platelet; Future - Comprehensive metabolic panel; Future - Lipid panel; Future - TSH; Future  Darleene Shape, NP

## 2023-12-17 ENCOUNTER — Other Ambulatory Visit: Payer: Medicare (Managed Care)

## 2023-12-17 DIAGNOSIS — E785 Hyperlipidemia, unspecified: Secondary | ICD-10-CM

## 2023-12-17 DIAGNOSIS — E119 Type 2 diabetes mellitus without complications: Secondary | ICD-10-CM

## 2023-12-17 DIAGNOSIS — K219 Gastro-esophageal reflux disease without esophagitis: Secondary | ICD-10-CM | POA: Diagnosis not present

## 2023-12-17 DIAGNOSIS — Z7984 Long term (current) use of oral hypoglycemic drugs: Secondary | ICD-10-CM

## 2023-12-17 DIAGNOSIS — I10 Essential (primary) hypertension: Secondary | ICD-10-CM | POA: Diagnosis not present

## 2023-12-17 LAB — CBC WITH DIFFERENTIAL/PLATELET
Basophils Absolute: 0 10*3/uL (ref 0.0–0.1)
Basophils Relative: 0.3 % (ref 0.0–3.0)
Eosinophils Absolute: 0.3 10*3/uL (ref 0.0–0.7)
Eosinophils Relative: 3.7 % (ref 0.0–5.0)
HCT: 37 % (ref 36.0–46.0)
Hemoglobin: 11.8 g/dL — ABNORMAL LOW (ref 12.0–15.0)
Lymphocytes Relative: 44 % (ref 12.0–46.0)
Lymphs Abs: 3.8 10*3/uL (ref 0.7–4.0)
MCHC: 32 g/dL (ref 30.0–36.0)
MCV: 84.3 fL (ref 78.0–100.0)
Monocytes Absolute: 0.7 10*3/uL (ref 0.1–1.0)
Monocytes Relative: 7.8 % (ref 3.0–12.0)
Neutro Abs: 3.8 10*3/uL (ref 1.4–7.7)
Neutrophils Relative %: 44.2 % (ref 43.0–77.0)
Platelets: 276 10*3/uL (ref 150.0–400.0)
RBC: 4.38 Mil/uL (ref 3.87–5.11)
RDW: 12.7 % (ref 11.5–15.5)
WBC: 8.6 10*3/uL (ref 4.0–10.5)

## 2023-12-17 LAB — COMPREHENSIVE METABOLIC PANEL
ALT: 11 U/L (ref 0–35)
AST: 16 U/L (ref 0–37)
Albumin: 4.8 g/dL (ref 3.5–5.2)
Alkaline Phosphatase: 79 U/L (ref 39–117)
BUN: 25 mg/dL — ABNORMAL HIGH (ref 6–23)
CO2: 27 meq/L (ref 19–32)
Calcium: 9.2 mg/dL (ref 8.4–10.5)
Chloride: 101 meq/L (ref 96–112)
Creatinine, Ser: 1.24 mg/dL — ABNORMAL HIGH (ref 0.40–1.20)
GFR: 41.52 mL/min — ABNORMAL LOW (ref >60.00)
Glucose, Bld: 174 mg/dL — ABNORMAL HIGH (ref 70–99)
Potassium: 4.6 meq/L (ref 3.5–5.1)
Sodium: 139 meq/L (ref 135–145)
Total Bilirubin: 0.5 mg/dL (ref 0.2–1.2)
Total Protein: 8.1 g/dL (ref 6.0–8.3)

## 2023-12-17 LAB — LIPID PANEL
Cholesterol: 246 mg/dL — ABNORMAL HIGH (ref 0–200)
HDL: 56.9 mg/dL (ref >39.00)
LDL Cholesterol: 163 mg/dL — ABNORMAL HIGH (ref 0–99)
NonHDL: 189.04
Total CHOL/HDL Ratio: 4
Triglycerides: 131 mg/dL (ref 0.0–149.0)
VLDL: 26.2 mg/dL (ref 0.0–40.0)

## 2023-12-17 LAB — TSH: TSH: 4.76 u[IU]/mL (ref 0.35–5.50)

## 2023-12-19 ENCOUNTER — Other Ambulatory Visit: Payer: Self-pay | Admitting: Adult Health

## 2023-12-19 MED ORDER — PRAVASTATIN SODIUM 20 MG PO TABS: 20.0000 mg | ORAL_TABLET | Freq: Every day | ORAL | 3 refills | Status: DC

## 2024-01-09 ENCOUNTER — Ambulatory Visit: Payer: Medicare (Managed Care) | Admitting: Endocrinology

## 2024-01-09 ENCOUNTER — Encounter: Payer: Self-pay | Admitting: Endocrinology

## 2024-01-09 VITALS — BP 128/82 | HR 66 | Ht 61.0 in | Wt 147.0 lb

## 2024-01-09 DIAGNOSIS — E1165 Type 2 diabetes mellitus with hyperglycemia: Secondary | ICD-10-CM

## 2024-01-09 DIAGNOSIS — I10 Essential (primary) hypertension: Secondary | ICD-10-CM

## 2024-01-09 DIAGNOSIS — Z7984 Long term (current) use of oral hypoglycemic drugs: Secondary | ICD-10-CM

## 2024-01-09 DIAGNOSIS — E7800 Pure hypercholesterolemia, unspecified: Secondary | ICD-10-CM

## 2024-01-09 LAB — POCT GLYCOSYLATED HEMOGLOBIN (HGB A1C): Hemoglobin A1C: 7.1 % — AB (ref 4.0–5.6)

## 2024-01-09 MED ORDER — BLOOD GLUCOSE MONITORING SUPPL DEVI: 1.0000 | Freq: Three times a day (TID) | 0 refills | Status: AC

## 2024-01-09 MED ORDER — SPIRONOLACTONE 50 MG PO TABS: 50.0000 mg | ORAL_TABLET | Freq: Every day | ORAL | 3 refills | Status: AC

## 2024-01-09 MED ORDER — LANCET DEVICE MISC: 1.0000 | Freq: Three times a day (TID) | 0 refills | Status: AC

## 2024-01-09 MED ORDER — LANCETS MISC. MISC: 1.0000 | Freq: Every day | 3 refills | Status: AC

## 2024-01-09 MED ORDER — BLOOD GLUCOSE TEST VI STRP: 1.0000 | ORAL_STRIP | Freq: Every day | 3 refills | Status: AC

## 2024-01-09 MED ORDER — GABAPENTIN 100 MG PO CAPS
100.0000 mg | ORAL_CAPSULE | Freq: Three times a day (TID) | ORAL | 3 refills | Status: DC
Start: 2024-01-09 — End: 2024-08-19

## 2024-01-09 MED ORDER — METFORMIN HCL ER 500 MG PO TB24: ORAL_TABLET | ORAL | 3 refills | Status: DC

## 2024-01-09 MED ORDER — GLIMEPIRIDE 2 MG PO TABS: 2.0000 mg | ORAL_TABLET | Freq: Every day | ORAL | 3 refills | Status: AC

## 2024-01-09 MED ORDER — PRAVASTATIN SODIUM 20 MG PO TABS: 20.0000 mg | ORAL_TABLET | Freq: Every day | ORAL | 3 refills | Status: AC

## 2024-01-09 NOTE — Patient Instructions (Addendum)
 Latest Reference Range & Units 07/28/23 15:46 01/09/24 09:48  Hemoglobin A1C 4.0 - 5.6 % -  7.6 ! Pend 7.1 ! Pend  !: Data is abnormal  Same dose of amaryl and metformin.  Stay away from ice cream or any sugary snacks.    Have your diabetic eye exam.

## 2024-01-09 NOTE — Progress Notes (Signed)
 Outpatient Endocrinology Note [place] Tito Ausmus, MD  01/09/24  Patient's Name: Pam Obrien    DOB: 08/13/1946    MRN: [id number]                                                    REASON OF VISIT: Follow up for type 2 diabetes mellitus  PCP: Shirline Frees, NP  HISTORY OF PRESENT ILLNESS:   Pam Obrien is a 78 y.o. old female with past medical history listed below, is here for follow up of type 2 diabetes mellitus.   Pertinent Diabetes History: Patient was previously seen by Dr. Lucianne Muss and was last time seen in May 2024.  Patient was diagnosed with type 2 diabetes mellitus in 2013.  She was initially treated with metformin, was stopped due to abdominal discomfort.  She had tried other oral medications including Jentadueto, around 2014 and it stopped in 2015.  She had taken Invokamet had excessive urination, nausea and weakness and stopped later.  She had been on glyburide in the past.  Januvia was planned in the past however not taken due to cost.  Insulin therapy was started in 2015.  However insulin was stopped in 2016 because of cost and has only been taking glimepiride.   Chronic Diabetes Complications : Retinopathy: no. Last ophthalmology exam was done on Due.  Nephropathy: CKD Peripheral neuropathy: yes, on gabapentin.  Coronary artery disease: no Stroke: no  Relevant comorbidities and cardiovascular risk factors: Obesity: no Body mass index is 27.78 kg/m.  Hypertension: yes Hyperlipidemia. Yes, on statin.   Current / Home Diabetic regimen includes: Metformin extended release 1000 mg daily/with supper. Amaryl 2 mg daily with supper.  Prior diabetic medications: Glyburide, Invokamet, basal, bolus insulin, premixed insulin, jentadueto.  Janumet.  Higher dose of metformin caused abdominal discomfort.  Glycemic data:   She forgot to bring glucometer in the clinic today.  She reports she has not been checking blood sugar lately, she ran out of the test strips.   Denies hypoglycemic symptoms.  Hypoglycemia: Patient has no hypoglycemic episodes. Patient has hypoglycemia awareness.  Factors modifying glucose control: 1.  Diabetic diet assessment: 2-3 meals a day.  Fruits for snacks.  Occasional ice cream.  2.  Staying active or exercising: No exercise.  3.  Medication compliance: compliant all of the time.  Interval history  Hemoglobin A1c today improved to 7.1%.  She reports he ran out of the glimepiride about a month ago.  She also needs a refill for his spironolactone.  She also ran out of her gabapentin and has not been taking for about a month.  She has complaints of numbness and tingling of the feet.  Denies hypoglycemic symptoms.  No other complaints today.  Recent lab reviewed renal function stable.  LDL elevated 163.  PCP recently appropriately increased pravastatin from 10 to 20 mg daily.  REVIEW OF SYSTEMS As per history of present illness.   PAST MEDICAL HISTORY: Past Medical History:  Diagnosis Date   Allergy    Anemia    as a young adult   Arthritis    Diabetes mellitus without complication (HCC)    Hyperlipidemia    was normal range 05/2017 with PCP   Hypertension     PAST SURGICAL HISTORY: Past Surgical History:  Procedure Laterality Date   ABDOMINAL HYSTERECTOMY  1994  COLONOSCOPY  2008   FLEXIBLE SIGMOIDOSCOPY  2003   MASS EXCISION Left 09/06/2022   Procedure: EXCISION MASS LEFT HAND;  Surgeon: Betha Loa, MD;  Location: [facility];  Service: Orthopedics;  Laterality: Left;   POLYPECTOMY      ALLERGIES: Allergies  Allergen Reactions   Aspirin     REACTION: nervous   Beef-Derived Drug Products     Sneezing, nasal drainage, throat scratchy   Celecoxib     REACTION: joints swell   Milk-Related Compounds     itching   Mold Extract [Trichophyton]     Pt does not know the reaction.  Positive allergy test per pt.   Nsaids     REACTION: sweling   Peanuts [Peanut Oil]     Throat feels  scratchy   Shellfish Allergy     Nasal congestion, throat scratchy, eyes watery   Sulfa Antibiotics     Other Reaction(s): RASH-HIVES   Sulfamethoxazole     REACTION: urticaria (hives)   Vioxx [Rofecoxib] Swelling    FAMILY HISTORY:  Family History  Problem Relation Age of Onset   Hypertension Sister    Stroke Other    Diabetes Father    Hypertension Father    Stroke Father    Diabetes Sister    Diabetes Brother    Hypertension Brother    Stroke Brother    Cancer Mother    Esophageal cancer Neg Hx    Rectal cancer Neg Hx    Stomach cancer Neg Hx    Colon cancer Neg Hx     SOCIAL HISTORY: Social History   Socioeconomic History   Marital status: Married    Spouse name: Not on file   Number of children: Not on file   Years of education: Not on file   Highest education level: Not on file  Occupational History   Not on file  Tobacco Use   Smoking status: Never   Smokeless tobacco: Never  Vaping Use   Vaping status: Never Used  Substance and Sexual Activity   Alcohol use: No   Drug use: No   Sexual activity: Not on file  Other Topics Concern   Not on file  Social History Narrative   Retired from [employer] work    Separated for 9 years.    Five children, two live locally, three are in [place]   One of her daughters live with her   Has a dog.    She likes to dance and walk   Social Drivers of Health   Financial Resource Strain: Low Risk  (08/14/2022)   Overall Financial Resource Strain (CARDIA)    Difficulty of Paying Living Expenses: Not hard at all  Food Insecurity: No Food Insecurity (08/14/2022)   Hunger Vital Sign    Worried About Running Out of Food in the Last Year: Never true    Ran Out of Food in the Last Year: Never true  Transportation Needs: No Transportation Needs (08/14/2022)   PRAPARE - Administrator, Civil Service (Medical): No    Lack of Transportation (Non-Medical): No  Physical Activity: Insufficiently Active (08/14/2022)    Exercise Vital Sign    Days of Exercise per Week: 7 days    Minutes of Exercise per Session: 20 min  Stress: No Stress Concern Present (08/14/2022)   [employer] of Occupational Health - Occupational Stress Questionnaire    Feeling of Stress : Not at all  Social Connections: Moderately Integrated (08/14/2022)  Social Advertising account executive [NHANES]    Frequency of Communication with Friends and Family: More than three times a week    Frequency of Social Gatherings with Friends and Family: More than three times a week    Attends Religious Services: More than 4 times per year    Active Member of [employer] or Organizations: Yes    Attends Engineer, structural: More than 4 times per year    Marital Status: Divorced    MEDICATIONS:  Current Outpatient Medications  Medication Sig Dispense Refill   acetaminophen (TYLENOL) 500 MG tablet Take 1 tablet (500 mg total) by mouth every 6 (six) hours as needed. 30 tablet 0   atenolol-chlorthalidone (TENORETIC) 50-25 MG tablet TAKE 1/2 TABLET BY MOUTH DAILY 45 tablet 2   Blood Glucose Monitoring Suppl DEVI 1 each by Does not apply route in the morning, at noon, and at bedtime. May substitute to any manufacturer covered by patient's insurance. 1 each 0   calcium citrate-vitamin D (CITRACAL+D) 315-200 MG-UNIT per tablet Take 1 tablet by mouth daily.     cholecalciferol (VITAMIN D) 1000 UNITS tablet Take 2,000 Units by mouth daily.     clotrimazole-betamethasone (LOTRISONE) cream Apply to affected area 2 times daily prn 15 g 0   FeFum-FePo-FA-B Cmp-C-Zn-Mn-Cu (SE-TAN PLUS) 162-115.2-1 MG CAPS TAKE 1 CAPSULE BY MOUTH EVERY DAY 90 capsule 3   Glucose Blood (BLOOD GLUCOSE TEST STRIPS) STRP 1 each by In Vitro route daily. May substitute to any manufacturer covered by patient's insurance. 100 each 3   Lancet Device MISC 1 each by Does not apply route in the morning, at noon, and at bedtime. May substitute to any manufacturer covered by patient's  insurance. 1 each 0   Lancets Misc. MISC 1 each by Does not apply route daily. May substitute to any manufacturer covered by patient's insurance. 100 each 3   Lidocaine 0.5 % GEL Apply 1 Application topically 2 (two) times daily as needed. 170 g 0   omeprazole (PRILOSEC) 20 MG capsule Take 20 mg by mouth daily.     ACCU-CHEK GUIDE test strip USE AS INSTRUCTED TO CHECK BLOOD SUGAR ONCE DAILY. (Patient not taking: Reported on 01/09/2024) 100 strip 2   Accu-Chek Softclix Lancets lancets USE AS DIRECTED EVERY DAY (Patient not taking: Reported on 01/09/2024) 100 each 1   Blood Glucose Monitoring Suppl (ACCU-CHEK AVIVA PLUS) w/Device KIT Use Accu Chek Aviva Plus to check blood sugar once daily. (Patient not taking: Reported on 01/09/2024) 1 kit 0   gabapentin (NEURONTIN) 100 MG capsule Take 1 capsule (100 mg total) by mouth 3 (three) times daily. 90 capsule 3   glimepiride (AMARYL) 2 MG tablet Take 1 tablet (2 mg total) by mouth daily with supper. 90 tablet 3   Lancets Misc. (ACCU-CHEK FASTCLIX LANCET) KIT by Does not apply route. USE LANCET TO CHECK BLOOD SUGAR ONCE DAILY. (Patient not taking: Reported on 01/09/2024)     metFORMIN (GLUCOPHAGE-XR) 500 MG 24 hr tablet TAKE 2 TABLETS BY MOUTH EVERY DAY WITH SUPPER 180 tablet 3   pravastatin (PRAVACHOL) 20 MG tablet Take 1 tablet (20 mg total) by mouth daily. 90 tablet 3   spironolactone (ALDACTONE) 50 MG tablet Take 1 tablet (50 mg total) by mouth daily. 90 tablet 3   No current facility-administered medications for this visit.    PHYSICAL EXAM: Vitals:   01/09/24 0933  BP: 128/82  Pulse: 66  SpO2: 98%  Weight: 147 lb (66.7 kg)  Height: 5'  1" (1.549 m)   Body mass index is 27.78 kg/m.  Wt Readings from Last 3 Encounters:  01/09/24 147 lb (66.7 kg)  12/16/23 149 lb (67.6 kg)  07/28/23 152 lb 12.8 oz (69.3 kg)    General: Well developed, well nourished female in no apparent distress.  HEENT: AT/[place], no external lesions.  Eyes: Conjunctiva clear  and no icterus. Neck: Neck supple  Lungs: Respirations not labored Neurologic: Alert, oriented, normal speech Extremities / Skin: Dry.  Psychiatric: Does not appear depressed or anxious  Diabetic Foot Exam - Simple   Simple Foot Form Diabetic Foot exam was performed with the following findings: Yes 01/09/2024  9:48 AM  Visual Inspection No deformities, no ulcerations, no other skin breakdown bilaterally: Yes See comments: Yes Sensation Testing Intact to touch and monofilament testing bilaterally: Yes Pulse Check Posterior Tibialis and Dorsalis pulse intact bilaterally: Yes See comments: Yes Comments Mild hammertoe deformities present.    LABS Reviewed Lab Results  Component Value Date   HGBA1C 7.1 (A) 01/09/2024   HGBA1C 7.6 (A) 07/28/2023   HGBA1C 6.5 (A) 03/19/2023   Lab Results  Component Value Date   FRUCTOSAMINE 294 (H) 02/07/2020   FRUCTOSAMINE 323 (H) 01/17/2017   FRUCTOSAMINE 463 (H) 02/20/2016   Lab Results  Component Value Date   CHOL 246 (H) 12/17/2023   HDL 56.90 12/17/2023   LDLCALC 163 (H) 12/17/2023   LDLDIRECT 98.0 12/02/2019   TRIG 131.0 12/17/2023   CHOLHDL 4 12/17/2023   Lab Results  Component Value Date   MICRALBCREAT 1.5 03/19/2023   MICRALBCREAT 0.6 02/07/2022   Lab Results  Component Value Date   CREATININE 1.24 (H) 12/17/2023   Lab Results  Component Value Date   GFR 41.52 (L) 12/17/2023    ASSESSMENT / PLAN  1. Uncontrolled type 2 diabetes mellitus with hyperglycemia, without long-term current use of insulin (HCC)   2. Pure hypercholesterolemia   3. Essential hypertension     Diabetes Mellitus type 2, complicated by peripheral neuropathy and CKD. - Diabetic status / severity: Uncontrolled.  Improving.  Lab Results  Component Value Date   HGBA1C 7.1 (A) 01/09/2024    - Hemoglobin A1c goal : <7%  - Medications: No change.  I) continue Amaryl 2 mg daily with supper. II) continue metformin extended release 1000 mg daily  with supper.  - Home glucose testing: Check at least 2 times a day in the morning fasting and at bedtime.  Asked to bring glucometer.  Prescription for test supplies sent. - Discussed/ Gave Hypoglycemia treatment plan.  # Consult : not required at this time.   # Annual urine for microalbuminuria/ creatinine ratio, no microalbuminuria currently. Last  Lab Results  Component Value Date   MICRALBCREAT 1.5 03/19/2023    # Foot check nightly / neuropathy, continue gabapentin 100 mg 3 times a day.  Medication renewed.  # Annual dilated diabetic eye exams.   - Diet: Make healthy diabetic food choices - Life style / activity / exercise: Discussed.  2. Blood pressure  -  BP Readings from Last 1 Encounters:  01/09/24 128/82    - Control is in target.  - No change in current plans.  3. Lipid status / Hyperlipidemia - Last  Lab Results  Component Value Date   LDLCALC 163 (H) 12/17/2023   -Pravastatin recently increased to 20 mg daily.  Pam Obrien was seen today for follow-up.  Diagnoses and all orders for this visit:  Uncontrolled type 2 diabetes mellitus with hyperglycemia, without  long-term current use of insulin (HCC) -     POCT glycosylated hemoglobin (Hb A1C) -     glimepiride (AMARYL) 2 MG tablet; Take 1 tablet (2 mg total) by mouth daily with supper. -     metFORMIN (GLUCOPHAGE-XR) 500 MG 24 hr tablet; TAKE 2 TABLETS BY MOUTH EVERY DAY WITH SUPPER  Pure hypercholesterolemia -     pravastatin (PRAVACHOL) 20 MG tablet; Take 1 tablet (20 mg total) by mouth daily.  Essential hypertension -     spironolactone (ALDACTONE) 50 MG tablet; Take 1 tablet (50 mg total) by mouth daily.  Other orders -     gabapentin (NEURONTIN) 100 MG capsule; Take 1 capsule (100 mg total) by mouth 3 (three) times daily. -     Blood Glucose Monitoring Suppl DEVI; 1 each by Does not apply route in the morning, at noon, and at bedtime. May substitute to any manufacturer covered by patient's  insurance. -     Glucose Blood (BLOOD GLUCOSE TEST STRIPS) STRP; 1 each by In Vitro route daily. May substitute to any manufacturer covered by patient's insurance. -     Lancet Device MISC; 1 each by Does not apply route in the morning, at noon, and at bedtime. May substitute to any manufacturer covered by patient's insurance. -     Lancets Misc. MISC; 1 each by Does not apply route daily. May substitute to any manufacturer covered by patient's insurance.    DISPOSITION Follow up in clinic in 4 months suggested.  Labs on the same day of the visit.   All questions answered and patient verbalized understanding of the plan.  [place] Dellie Piasecki, MD [facility] Endocrinology [facility] [address] [place], [address] [place], [place] [address] Phone # [phone]  At least part of this note was generated using voice recognition software. Inadvertent word errors may have occurred, which were not recognized during the proofreading process.

## 2024-01-16 ENCOUNTER — Other Ambulatory Visit: Payer: Self-pay | Admitting: Adult Health

## 2024-01-16 DIAGNOSIS — I10 Essential (primary) hypertension: Secondary | ICD-10-CM

## 2024-04-19 ENCOUNTER — Encounter: Payer: Self-pay | Admitting: Endocrinology

## 2024-04-19 ENCOUNTER — Ambulatory Visit: Payer: Self-pay | Admitting: Endocrinology

## 2024-04-19 ENCOUNTER — Ambulatory Visit: Payer: Medicare (Managed Care) | Admitting: Endocrinology

## 2024-04-19 VITALS — BP 136/94 | HR 102 | Resp 20 | Ht 61.0 in | Wt 146.8 lb

## 2024-04-19 DIAGNOSIS — E1165 Type 2 diabetes mellitus with hyperglycemia: Secondary | ICD-10-CM | POA: Diagnosis not present

## 2024-04-19 DIAGNOSIS — E559 Vitamin D deficiency, unspecified: Secondary | ICD-10-CM

## 2024-04-19 DIAGNOSIS — R5382 Chronic fatigue, unspecified: Secondary | ICD-10-CM | POA: Diagnosis not present

## 2024-04-19 DIAGNOSIS — E118 Type 2 diabetes mellitus with unspecified complications: Secondary | ICD-10-CM | POA: Diagnosis not present

## 2024-04-19 LAB — POCT GLYCOSYLATED HEMOGLOBIN (HGB A1C): Hemoglobin A1C: 6.1 % — AB (ref 4.0–5.6)

## 2024-04-19 NOTE — Patient Instructions (Signed)
 Latest Reference Range & Units 07/28/23 15:46 01/09/24 09:48 04/19/24 08:38  Hemoglobin A1C 4.0 - 5.6 % -  7.6 ! Pend 7.1 ! Pend 6.1 ! Pend  !: Data is abnormal

## 2024-04-19 NOTE — Progress Notes (Signed)
 Outpatient Endocrinology Note [place] Allon Costlow, MD  04/19/24  Patient's Name: Pam Obrien    DOB: [birth year]    MRN: [id number]                                                    REASON OF VISIT: Follow up for type 2 diabetes mellitus  PCP: Alto Atta, NP  HISTORY OF PRESENT ILLNESS:   Pam Obrien is a 78 y.o. old female with past medical history listed below, is here for follow up of type 2 diabetes mellitus.   Pertinent Diabetes History: Patient was previously seen by Dr. Hubert Madden and was last time seen in May 2024.  Patient was diagnosed with type 2 diabetes mellitus in 2013.  She was initially treated with metformin , was stopped due to abdominal discomfort.  She had tried other oral medications including Jentadueto , around 2014 and it stopped in 2015.  She had taken Invokamet  had excessive urination, nausea and weakness and stopped later.  She had been on glyburide  in the past.  Januvia  was planned in the past however not taken due to cost.  Insulin  therapy was started in 2015.  However insulin  was stopped in 2016 because of cost and has only been taking glimepiride .   Chronic Diabetes Complications : Retinopathy: no. Last ophthalmology exam was done on Due.  Nephropathy: CKD Peripheral neuropathy: yes, on gabapentin .  Coronary artery disease: no Stroke: no  Relevant comorbidities and cardiovascular risk factors: Obesity: no Body mass index is 27.74 kg/m.  Hypertension: yes Hyperlipidemia. Yes, on statin.   Current / Home Diabetic regimen includes: Metformin  extended release 1000 mg daily/with supper. Amaryl  2 mg daily with supper.  Prior diabetic medications: Glyburide , Invokamet , basal, bolus insulin , premixed insulin , jentadueto .  Janumet .  Higher dose of metformin  caused abdominal discomfort.  Glycemic data:   Accu-Chek guide glucometer download from May 26 to April 19, 2024, average blood sugar 139.  Lowest blood sugar 108, highest blood sugar 186.  Fasting  blood sugar 162, 124, 108, blood sugar in the afternoon and evening 186, 114, 130, 148.  She has been checking few times a week.  Hypoglycemia: Patient has no hypoglycemic episodes. Patient has hypoglycemia awareness.  Factors modifying glucose control: 1.  Diabetic diet assessment: 2-3 meals a day.  Fruits for snacks.  Occasional ice cream.  2.  Staying active or exercising: No exercise.  3.  Medication compliance: compliant all of the time.  Interval history Hemoglobin A1c improved to 6.1%.  Diabetes regimen as reviewed and noted above.  Glucometer today as reviewed and noted above.  She complains of fatigue and occasional muscle pain.  She does not take multivitamin or vitamin D supplement.  Patient reports she used to take vitamin D supplement in the past.  No other complaints today.   REVIEW OF SYSTEMS As per history of present illness.   PAST MEDICAL HISTORY: Past Medical History:  Diagnosis Date   Allergy    Anemia    as a young adult   Arthritis    Diabetes mellitus without complication (HCC)    Hyperlipidemia    was normal range 05/2017 with PCP   Hypertension     PAST SURGICAL HISTORY: Past Surgical History:  Procedure Laterality Date   ABDOMINAL HYSTERECTOMY  1994   COLONOSCOPY  2008   FLEXIBLE SIGMOIDOSCOPY  2003   MASS EXCISION Left 09/06/2022   Procedure: EXCISION MASS LEFT HAND;  Surgeon: Brunilda Capra, MD;  Location: [facility];  Service: Orthopedics;  Laterality: Left;   POLYPECTOMY      ALLERGIES: Allergies  Allergen Reactions   Aspirin     REACTION: nervous   Beef-Derived Drug Products     Sneezing, nasal drainage, throat scratchy   Celecoxib     REACTION: joints swell   Milk-Related Compounds     itching   Mold Extract [Trichophyton]     Pt does not know the reaction.  Positive allergy test per pt.   Nsaids     REACTION: sweling   Peanuts [Peanut Oil]     Throat feels scratchy   Shellfish Allergy     Nasal congestion,  throat scratchy, eyes watery   Sulfa Antibiotics     Other Reaction(s): RASH-HIVES   Sulfamethoxazole     REACTION: urticaria (hives)   Vioxx [Rofecoxib] Swelling    FAMILY HISTORY:  Family History  Problem Relation Age of Onset   Hypertension Sister    Stroke Other    Diabetes Father    Hypertension Father    Stroke Father    Diabetes Sister    Diabetes Brother    Hypertension Brother    Stroke Brother    Cancer Mother    Esophageal cancer Neg Hx    Rectal cancer Neg Hx    Stomach cancer Neg Hx    Colon cancer Neg Hx     SOCIAL HISTORY: Social History   Socioeconomic History   Marital status: Married    Spouse name: Not on file   Number of children: Not on file   Years of education: Not on file   Highest education level: Not on file  Occupational History   Not on file  Tobacco Use   Smoking status: Never   Smokeless tobacco: Never  Vaping Use   Vaping status: Never Used  Substance and Sexual Activity   Alcohol use: No   Drug use: No   Sexual activity: Not on file  Other Topics Concern   Not on file  Social History Narrative   Retired from [employer] work    Separated for 9 years.    Five children, two live locally, three are in [place]    One of her daughters live with her   Has a dog.    She likes to dance and walk   Social Drivers of Health   Financial Resource Strain: Low Risk  (08/14/2022)   Overall Financial Resource Strain (CARDIA)    Difficulty of Paying Living Expenses: Not hard at all  Food Insecurity: No Food Insecurity (08/14/2022)   Hunger Vital Sign    Worried About Running Out of Food in the Last Year: Never true    Ran Out of Food in the Last Year: Never true  Transportation Needs: No Transportation Needs (08/14/2022)   PRAPARE - Administrator, Civil Service (Medical): No    Lack of Transportation (Non-Medical): No  Physical Activity: Insufficiently Active (08/14/2022)   Exercise Vital Sign    Days of Exercise per Week: 7  days    Minutes of Exercise per Session: 20 min  Stress: No Stress Concern Present (08/14/2022)   [employer] of Occupational Health - Occupational Stress Questionnaire    Feeling of Stress : Not at all  Social Connections: Moderately Integrated (08/14/2022)   Social Connection and Isolation Panel [NHANES]  Frequency of Communication with Friends and Family: More than three times a week    Frequency of Social Gatherings with Friends and Family: More than three times a week    Attends Religious Services: More than 4 times per year    Active Member of [employer] or Organizations: Yes    Attends Engineer, structural: More than 4 times per year    Marital Status: Divorced    MEDICATIONS:  Current Outpatient Medications  Medication Sig Dispense Refill   ACCU-CHEK GUIDE test strip USE AS INSTRUCTED TO CHECK BLOOD SUGAR ONCE DAILY. (Patient not taking: Reported on 01/09/2024) 100 strip 2   Accu-Chek Softclix Lancets lancets USE AS DIRECTED EVERY DAY (Patient not taking: Reported on 01/09/2024) 100 each 1   acetaminophen  (TYLENOL ) 500 MG tablet Take 1 tablet (500 mg total) by mouth every 6 (six) hours as needed. 30 tablet 0   atenolol -chlorthalidone  (TENORETIC ) 50-25 MG tablet TAKE 1/2 TABLET BY MOUTH DAILY 45 tablet 2   Blood Glucose Monitoring Suppl (ACCU-CHEK AVIVA PLUS) w/Device KIT Use Accu Chek Aviva Plus to check blood sugar once daily. (Patient not taking: Reported on 01/09/2024) 1 kit 0   Blood Glucose Monitoring Suppl DEVI 1 each by Does not apply route in the morning, at noon, and at bedtime. May substitute to any manufacturer covered by patient's insurance. 1 each 0   calcium  citrate-vitamin D (CITRACAL+D) 315-200 MG-UNIT per tablet Take 1 tablet by mouth daily.     cholecalciferol (VITAMIN D) 1000 UNITS tablet Take 2,000 Units by mouth daily.     clotrimazole -betamethasone  (LOTRISONE ) cream Apply to affected area 2 times daily prn 15 g 0   FeFum-FePo-FA-B Cmp-C-Zn-Mn-Cu (SE-TAN  PLUS) 162-115.2-1 MG CAPS TAKE 1 CAPSULE BY MOUTH EVERY DAY 90 capsule 3   gabapentin  (NEURONTIN ) 100 MG capsule Take 1 capsule (100 mg total) by mouth 3 (three) times daily. 90 capsule 3   glimepiride  (AMARYL ) 2 MG tablet Take 1 tablet (2 mg total) by mouth daily with supper. 90 tablet 3   Glucose Blood (BLOOD GLUCOSE TEST STRIPS) STRP 1 each by In Vitro route daily. May substitute to any manufacturer covered by patient's insurance. 100 each 3   Lancets Misc. (ACCU-CHEK FASTCLIX LANCET) KIT by Does not apply route. USE LANCET TO CHECK BLOOD SUGAR ONCE DAILY. (Patient not taking: Reported on 01/09/2024)     Lancets Misc. MISC 1 each by Does not apply route daily. May substitute to any manufacturer covered by patient's insurance. 100 each 3   Lidocaine  0.5 % GEL Apply 1 Application topically 2 (two) times daily as needed. 170 g 0   metFORMIN  (GLUCOPHAGE -XR) 500 MG 24 hr tablet TAKE 2 TABLETS BY MOUTH EVERY DAY WITH SUPPER 180 tablet 3   omeprazole (PRILOSEC) 20 MG capsule Take 20 mg by mouth daily.     pravastatin  (PRAVACHOL ) 20 MG tablet Take 1 tablet (20 mg total) by mouth daily. 90 tablet 3   spironolactone  (ALDACTONE ) 50 MG tablet Take 1 tablet (50 mg total) by mouth daily. 90 tablet 3   No current facility-administered medications for this visit.    PHYSICAL EXAM: Vitals:   04/19/24 0837 04/19/24 0838  BP: (!) 148/100 (!) 136/94  Pulse: (!) 102   Resp: 20   SpO2: 97%   Weight: 146 lb 12.8 oz (66.6 kg)   Height: 5\' 1"  (1.549 m)    Body mass index is 27.74 kg/m.  Wt Readings from Last 3 Encounters:  04/19/24 146 lb 12.8 oz (66.6  kg)  01/09/24 147 lb (66.7 kg)  12/16/23 149 lb (67.6 kg)    General: Well developed, well nourished female in no apparent distress.  HEENT: AT/[place], no external lesions.  Eyes: Conjunctiva clear and no icterus. Neck: Neck supple  Lungs: Respirations not labored Neurologic: Alert, oriented, normal speech Extremities / Skin: Dry.  Psychiatric: Does not  appear depressed or anxious  Diabetic Foot Exam - Simple   No data filed    LABS Reviewed Lab Results  Component Value Date   HGBA1C 6.1 (A) 04/19/2024   HGBA1C 7.1 (A) 01/09/2024   HGBA1C 7.6 (A) 07/28/2023   Lab Results  Component Value Date   FRUCTOSAMINE 294 (H) 02/07/2020   FRUCTOSAMINE 323 (H) 01/17/2017   FRUCTOSAMINE 463 (H) 02/20/2016   Lab Results  Component Value Date   CHOL 246 (H) 12/17/2023   HDL 56.90 12/17/2023   LDLCALC 163 (H) 12/17/2023   LDLDIRECT 98.0 12/02/2019   TRIG 131.0 12/17/2023   CHOLHDL 4 12/17/2023   Lab Results  Component Value Date   MICRALBCREAT 1.5 03/19/2023   MICRALBCREAT 0.6 02/07/2022   Lab Results  Component Value Date   CREATININE 1.24 (H) 12/17/2023   Lab Results  Component Value Date   GFR 41.52 (L) 12/17/2023    ASSESSMENT / PLAN  1. Controlled type 2 diabetes mellitus with complication, without long-term current use of insulin  (HCC)   2. Chronic fatigue   3. Vitamin D deficiency     Diabetes Mellitus type 2, complicated by peripheral neuropathy and CKD. - Diabetic status / severity: Uncontrolled.  Improving.  Lab Results  Component Value Date   HGBA1C 6.1 (A) 04/19/2024    - Hemoglobin A1c goal : <7%  - Medications: No change.  I) continue Amaryl  2 mg daily with supper. II) continue metformin  extended release 1000 mg daily with supper.  - Home glucose testing: Check at least 2 times a day in the morning fasting and at bedtime.  Asked to bring glucometer.  Prescription for test supplies sent. - Discussed/ Gave Hypoglycemia treatment plan.  # Consult : not required at this time.   # Annual urine for microalbuminuria/ creatinine ratio, no microalbuminuria currently. Last  Lab Results  Component Value Date   MICRALBCREAT 1.5 03/19/2023    # Foot check nightly / neuropathy, continue gabapentin  100 mg 3 times a day.  Medication renewed.  # Annual dilated diabetic eye exams.   - Diet: Make healthy  diabetic food choices - Life style / activity / exercise: Discussed.  2. Blood pressure  -  BP Readings from Last 1 Encounters:  04/19/24 (!) 136/94    - Control is in target.  - No change in current plans.  3. Lipid status / Hyperlipidemia - Last  Lab Results  Component Value Date   LDLCALC 163 (H) 12/17/2023   -Pravastatin  recently increased to 20 mg daily.  Diagnoses and all orders for this visit:  Controlled type 2 diabetes mellitus with complication, without long-term current use of insulin  (HCC) -     POCT glycosylated hemoglobin (Hb A1C) -     Basic metabolic panel with GFR -     Microalbumin / creatinine urine ratio  Chronic fatigue -     TSH -     T4, free  Vitamin D deficiency -     VITAMIN D 25 Hydroxy (Vit-D Deficiency, Fractures)    DISPOSITION Follow up in clinic in 4 months suggested.  Labs on the same day of the  visit.   All questions answered and patient verbalized understanding of the plan.  [place] Analayah Brooke, MD [facility] Endocrinology [facility] [address] [place], [address] [place], [place] [address] Phone # [phone]  At least part of this note was generated using voice recognition software. Inadvertent word errors may have occurred, which were not recognized during the proofreading process.  Outpatient Endocrinology Note [place] Baylor Teegarden, MD  04/19/24  Patient's Name: Pam Obrien    DOB: June 22, 1946    MRN: [id number]                                                    REASON OF VISIT: Follow up for type 2 diabetes mellitus  PCP: Alto Atta, NP  HISTORY OF PRESENT ILLNESS:   TARONDA COMACHO is a 78 y.o. old female with past medical history listed below, is here for follow up of type 2 diabetes mellitus.   Pertinent Diabetes History: Patient was previously seen by Dr. Hubert Madden and was last time seen in May 2024.  Patient was diagnosed with type 2 diabetes mellitus in 2013.  She was initially treated with metformin , was  stopped due to abdominal discomfort.  She had tried other oral medications including Jentadueto , around 2014 and it stopped in 2015.  She had taken Invokamet  had excessive urination, nausea and weakness and stopped later.  She had been on glyburide  in the past.  Januvia  was planned in the past however not taken due to cost.  Insulin  therapy was started in 2015.  However insulin  was stopped in 2016 because of cost and has only been taking glimepiride .   Chronic Diabetes Complications : Retinopathy: no. Last ophthalmology exam was done on Due.  Nephropathy: CKD Peripheral neuropathy: yes, on gabapentin .  Coronary artery disease: no Stroke: no  Relevant comorbidities and cardiovascular risk factors: Obesity: no Body mass index is 27.74 kg/m.  Hypertension: yes Hyperlipidemia. Yes, on statin.   Current / Home Diabetic regimen includes: Metformin  extended release 1000 mg daily/with supper. Amaryl  2 mg daily with supper.  Prior diabetic medications: Glyburide , Invokamet , basal, bolus insulin , premixed insulin , jentadueto .  Janumet .  Higher dose of metformin  caused abdominal discomfort.  Glycemic data:   She forgot to bring glucometer in the clinic today.  She reports she has not been checking blood sugar lately, she ran out of the test strips.  Denies hypoglycemic symptoms.  Hypoglycemia: Patient has no hypoglycemic episodes. Patient has hypoglycemia awareness.  Factors modifying glucose control: 1.  Diabetic diet assessment: 2-3 meals a day.  Fruits for snacks.  Occasional ice cream.  2.  Staying active or exercising: No exercise.  3.  Medication compliance: compliant all of the time.  Interval history  Hemoglobin A1c today improved to 7.1%.  She reports he ran out of the glimepiride  about a month ago.  She also needs a refill for his spironolactone .  She also ran out of her gabapentin  and has not been taking for about a month.  She has complaints of numbness and tingling of the  feet.  Denies hypoglycemic symptoms.  No other complaints today.  Recent lab reviewed renal function stable.  LDL elevated 163.  PCP recently appropriately increased pravastatin  from 10 to 20 mg daily.  REVIEW OF SYSTEMS As per history of present illness.   PAST MEDICAL HISTORY: Past Medical History:  Diagnosis Date   Allergy    Anemia    as a young adult   Arthritis    Diabetes mellitus without complication (HCC)    Hyperlipidemia    was normal range 05/2017 with PCP   Hypertension     PAST SURGICAL HISTORY: Past Surgical History:  Procedure Laterality Date   ABDOMINAL HYSTERECTOMY  1994   COLONOSCOPY  2008   FLEXIBLE SIGMOIDOSCOPY  2003   MASS EXCISION Left 09/06/2022   Procedure: EXCISION MASS LEFT HAND;  Surgeon: Brunilda Capra, MD;  Location: [facility];  Service: Orthopedics;  Laterality: Left;   POLYPECTOMY      ALLERGIES: Allergies  Allergen Reactions   Aspirin     REACTION: nervous   Beef-Derived Drug Products     Sneezing, nasal drainage, throat scratchy   Celecoxib     REACTION: joints swell   Milk-Related Compounds     itching   Mold Extract [Trichophyton]     Pt does not know the reaction.  Positive allergy test per pt.   Nsaids     REACTION: sweling   Peanuts [Peanut Oil]     Throat feels scratchy   Shellfish Allergy     Nasal congestion, throat scratchy, eyes watery   Sulfa Antibiotics     Other Reaction(s): RASH-HIVES   Sulfamethoxazole     REACTION: urticaria (hives)   Vioxx [Rofecoxib] Swelling    FAMILY HISTORY:  Family History  Problem Relation Age of Onset   Hypertension Sister    Stroke Other    Diabetes Father    Hypertension Father    Stroke Father    Diabetes Sister    Diabetes Brother    Hypertension Brother    Stroke Brother    Cancer Mother    Esophageal cancer Neg Hx    Rectal cancer Neg Hx    Stomach cancer Neg Hx    Colon cancer Neg Hx     SOCIAL HISTORY: Social History   Socioeconomic History    Marital status: Married    Spouse name: Not on file   Number of children: Not on file   Years of education: Not on file   Highest education level: Not on file  Occupational History   Not on file  Tobacco Use   Smoking status: Never   Smokeless tobacco: Never  Vaping Use   Vaping status: Never Used  Substance and Sexual Activity   Alcohol use: No   Drug use: No   Sexual activity: Not on file  Other Topics Concern   Not on file  Social History Narrative   Retired from [employer] work    Separated for 9 years.    Five children, two live locally, three are in [place]    One of her daughters live with her   Has a dog.    She likes to dance and walk   Social Drivers of Health   Financial Resource Strain: Low Risk  (08/14/2022)   Overall Financial Resource Strain (CARDIA)    Difficulty of Paying Living Expenses: Not hard at all  Food Insecurity: No Food Insecurity (08/14/2022)   Hunger Vital Sign    Worried About Running Out of Food in the Last Year: Never true    Ran Out of Food in the Last Year: Never true  Transportation Needs: No Transportation Needs (08/14/2022)   PRAPARE - Administrator, Civil Service (Medical): No    Lack of Transportation (Non-Medical): No  Physical Activity:  Insufficiently Active (08/14/2022)   Exercise Vital Sign    Days of Exercise per Week: 7 days    Minutes of Exercise per Session: 20 min  Stress: No Stress Concern Present (08/14/2022)   [employer] of Occupational Health - Occupational Stress Questionnaire    Feeling of Stress : Not at all  Social Connections: Moderately Integrated (08/14/2022)   Social Connection and Isolation Panel [NHANES]    Frequency of Communication with Friends and Family: More than three times a week    Frequency of Social Gatherings with Friends and Family: More than three times a week    Attends Religious Services: More than 4 times per year    Active Member of [employer] or Organizations: Yes    Attends Museum/gallery exhibitions officer: More than 4 times per year    Marital Status: Divorced    MEDICATIONS:  Current Outpatient Medications  Medication Sig Dispense Refill   ACCU-CHEK GUIDE test strip USE AS INSTRUCTED TO CHECK BLOOD SUGAR ONCE DAILY. (Patient not taking: Reported on 01/09/2024) 100 strip 2   Accu-Chek Softclix Lancets lancets USE AS DIRECTED EVERY DAY (Patient not taking: Reported on 01/09/2024) 100 each 1   acetaminophen  (TYLENOL ) 500 MG tablet Take 1 tablet (500 mg total) by mouth every 6 (six) hours as needed. 30 tablet 0   atenolol -chlorthalidone  (TENORETIC ) 50-25 MG tablet TAKE 1/2 TABLET BY MOUTH DAILY 45 tablet 2   Blood Glucose Monitoring Suppl (ACCU-CHEK AVIVA PLUS) w/Device KIT Use Accu Chek Aviva Plus to check blood sugar once daily. (Patient not taking: Reported on 01/09/2024) 1 kit 0   Blood Glucose Monitoring Suppl DEVI 1 each by Does not apply route in the morning, at noon, and at bedtime. May substitute to any manufacturer covered by patient's insurance. 1 each 0   calcium  citrate-vitamin D (CITRACAL+D) 315-200 MG-UNIT per tablet Take 1 tablet by mouth daily.     cholecalciferol (VITAMIN D) 1000 UNITS tablet Take 2,000 Units by mouth daily.     clotrimazole -betamethasone  (LOTRISONE ) cream Apply to affected area 2 times daily prn 15 g 0   FeFum-FePo-FA-B Cmp-C-Zn-Mn-Cu (SE-TAN PLUS ) 162-115.2-1 MG CAPS TAKE 1 CAPSULE BY MOUTH EVERY DAY 90 capsule 3   gabapentin  (NEURONTIN ) 100 MG capsule Take 1 capsule (100 mg total) by mouth 3 (three) times daily. 90 capsule 3   glimepiride  (AMARYL ) 2 MG tablet Take 1 tablet (2 mg total) by mouth daily with supper. 90 tablet 3   Glucose Blood (BLOOD GLUCOSE TEST STRIPS) STRP 1 each by In Vitro route daily. May substitute to any manufacturer covered by patient's insurance. 100 each 3   Lancets Misc. (ACCU-CHEK FASTCLIX LANCET) KIT by Does not apply route. USE LANCET TO CHECK BLOOD SUGAR ONCE DAILY. (Patient not taking: Reported on 01/09/2024)      Lancets Misc. MISC 1 each by Does not apply route daily. May substitute to any manufacturer covered by patient's insurance. 100 each 3   Lidocaine  0.5 % GEL Apply 1 Application topically 2 (two) times daily as needed. 170 g 0   metFORMIN  (GLUCOPHAGE -XR) 500 MG 24 hr tablet TAKE 2 TABLETS BY MOUTH EVERY DAY WITH SUPPER 180 tablet 3   omeprazole (PRILOSEC) 20 MG capsule Take 20 mg by mouth daily.     pravastatin  (PRAVACHOL ) 20 MG tablet Take 1 tablet (20 mg total) by mouth daily. 90 tablet 3   spironolactone  (ALDACTONE ) 50 MG tablet Take 1 tablet (50 mg total) by mouth daily. 90 tablet 3   No current  facility-administered medications for this visit.    PHYSICAL EXAM: Vitals:   04/19/24 0837 04/19/24 0838  BP: (!) 148/100 (!) 136/94  Pulse: (!) 102   Resp: 20   SpO2: 97%   Weight: 146 lb 12.8 oz (66.6 kg)   Height: 5\' 1"  (1.549 m)    Body mass index is 27.74 kg/m.  Wt Readings from Last 3 Encounters:  04/19/24 146 lb 12.8 oz (66.6 kg)  01/09/24 147 lb (66.7 kg)  12/16/23 149 lb (67.6 kg)    General: Well developed, well nourished female in no apparent distress.  HEENT: AT/[place], no external lesions.  Eyes: Conjunctiva clear and no icterus. Neck: Neck supple  Lungs: Respirations not labored Neurologic: Alert, oriented, normal speech Extremities / Skin: Dry.  Psychiatric: Does not appear depressed or anxious  Diabetic Foot Exam - Simple   No data filed    LABS Reviewed Lab Results  Component Value Date   HGBA1C 6.1 (A) 04/19/2024   HGBA1C 7.1 (A) 01/09/2024   HGBA1C 7.6 (A) 07/28/2023   Lab Results  Component Value Date   FRUCTOSAMINE 294 (H) 02/07/2020   FRUCTOSAMINE 323 (H) 01/17/2017   FRUCTOSAMINE 463 (H) 02/20/2016   Lab Results  Component Value Date   CHOL 246 (H) 12/17/2023   HDL 56.90 12/17/2023   LDLCALC 163 (H) 12/17/2023   LDLDIRECT 98.0 12/02/2019   TRIG 131.0 12/17/2023   CHOLHDL 4 12/17/2023   Lab Results  Component Value Date   MICRALBCREAT 1.5  03/19/2023   MICRALBCREAT 0.6 02/07/2022   Lab Results  Component Value Date   CREATININE 1.24 (H) 12/17/2023   Lab Results  Component Value Date   GFR 41.52 (L) 12/17/2023    ASSESSMENT / PLAN  1. Controlled type 2 diabetes mellitus with complication, without long-term current use of insulin  (HCC)   2. Chronic fatigue   3. Vitamin D deficiency     Diabetes Mellitus type 2, complicated by peripheral neuropathy and CKD. - Diabetic status / severity:controlled.  Improving.  Lab Results  Component Value Date   HGBA1C 6.1 (A) 04/19/2024    - Hemoglobin A1c goal : <7%  - Medications: No change.  I) continue Amaryl  2 mg daily with supper. II) continue metformin  extended release 1000 mg daily with supper.  - Home glucose testing: Check at least 2 times a day in the morning fasting and at bedtime.   - Discussed/ Gave Hypoglycemia treatment plan.  # Consult : not required at this time.   # Annual urine for microalbuminuria/ creatinine ratio, no microalbuminuria currently.  Will check today along with BMP. Last  Lab Results  Component Value Date   MICRALBCREAT 1.5 03/19/2023    # Foot check nightly / neuropathy, continue gabapentin  100 mg 3 times a day.  Medication renewed.  # Annual dilated diabetic eye exams.   - Diet: Make healthy diabetic food choices - Life style / activity / exercise: Discussed.  2. Blood pressure  -  BP Readings from Last 1 Encounters:  04/19/24 (!) 136/94    - Control is in target.  - No change in current plans.  3. Lipid status / Hyperlipidemia - Last  Lab Results  Component Value Date   LDLCALC 163 (H) 12/17/2023   -Pravastatin  recently increased to 20 mg daily.  Managed by primary care provider.  # Fatigue - Discussed that it can be multifactorial.  Will check thyroid  function test and vitamin D level today.  Patient has history of vitamin D deficiency.  Currently not on vitamin D supplement.  Diagnoses and all orders for this  visit:  Controlled type 2 diabetes mellitus with complication, without long-term current use of insulin  (HCC) -     POCT glycosylated hemoglobin (Hb A1C) -     Basic metabolic panel with GFR -     Microalbumin / creatinine urine ratio  Chronic fatigue -     TSH -     T4, free  Vitamin D deficiency -     VITAMIN D 25 Hydroxy (Vit-D Deficiency, Fractures)    DISPOSITION Follow up in clinic in 4 months suggested.    All questions answered and patient verbalized understanding of the plan.  [place] Shanaiya Bene, MD [facility] Endocrinology [facility] [address] [place], [address] [place], [place] [address] Phone # [phone]  At least part of this note was generated using voice recognition software. Inadvertent word errors may have occurred, which were not recognized during the proofreading process.

## 2024-04-20 LAB — BASIC METABOLIC PANEL WITH GFR
BUN/Creatinine Ratio: 20 (calc) (ref 6–22)
BUN: 32 mg/dL — ABNORMAL HIGH (ref 7–25)
CO2: 22 mmol/L (ref 20–32)
Calcium: 9.2 mg/dL (ref 8.6–10.4)
Chloride: 105 mmol/L (ref 98–110)
Creat: 1.57 mg/dL — ABNORMAL HIGH (ref 0.60–1.00)
Glucose, Bld: 127 mg/dL — ABNORMAL HIGH (ref 65–99)
Potassium: 4.3 mmol/L (ref 3.5–5.3)
Sodium: 141 mmol/L (ref 135–146)
eGFR: 33 mL/min/{1.73_m2} — ABNORMAL LOW (ref >=60)

## 2024-04-20 LAB — MICROALBUMIN / CREATININE URINE RATIO
Creatinine, Urine: 141 mg/dL (ref 20–275)
Microalb Creat Ratio: 16 mg/g{creat} (ref <30)
Microalb, Ur: 2.3 mg/dL

## 2024-04-20 LAB — TSH: TSH: 4.17 m[IU]/L (ref 0.40–4.50)

## 2024-04-20 LAB — VITAMIN D 25 HYDROXY (VIT D DEFICIENCY, FRACTURES): Vit D, 25-Hydroxy: 73 ng/mL (ref 30–100)

## 2024-04-20 LAB — T4, FREE: Free T4: 1.2 ng/dL (ref 0.8–1.8)

## 2024-04-23 ENCOUNTER — Other Ambulatory Visit: Payer: Self-pay | Admitting: Endocrinology

## 2024-04-23 DIAGNOSIS — N179 Acute kidney failure, unspecified: Secondary | ICD-10-CM

## 2024-04-23 DIAGNOSIS — N1832 Chronic kidney disease, stage 3b: Secondary | ICD-10-CM

## 2024-05-07 ENCOUNTER — Ambulatory Visit: Payer: Medicare (Managed Care) | Admitting: Endocrinology

## 2024-07-23 DIAGNOSIS — N1832 Chronic kidney disease, stage 3b: Secondary | ICD-10-CM | POA: Diagnosis not present

## 2024-08-19 ENCOUNTER — Ambulatory Visit: Payer: Medicare (Managed Care) | Admitting: Endocrinology

## 2024-08-19 ENCOUNTER — Encounter: Payer: Self-pay | Admitting: Endocrinology

## 2024-08-19 ENCOUNTER — Other Ambulatory Visit: Payer: Medicare (Managed Care)

## 2024-08-19 ENCOUNTER — Ambulatory Visit: Payer: Self-pay | Admitting: Endocrinology

## 2024-08-19 VITALS — BP 152/82 | HR 75 | Resp 20 | Ht 61.0 in | Wt 149.4 lb

## 2024-08-19 DIAGNOSIS — Z7984 Long term (current) use of oral hypoglycemic drugs: Secondary | ICD-10-CM | POA: Diagnosis not present

## 2024-08-19 DIAGNOSIS — E1165 Type 2 diabetes mellitus with hyperglycemia: Secondary | ICD-10-CM

## 2024-08-19 DIAGNOSIS — E118 Type 2 diabetes mellitus with unspecified complications: Secondary | ICD-10-CM | POA: Diagnosis not present

## 2024-08-19 LAB — POCT GLYCOSYLATED HEMOGLOBIN (HGB A1C): Hemoglobin A1C: 6.5 % — AB (ref 4.0–5.6)

## 2024-08-19 NOTE — Progress Notes (Signed)
 Outpatient Endocrinology Note [name], MD  08/19/24  Patient's Name: Pam Obrien    DOB: 22-Nov-1945    MRN: [id number]                                                    REASON OF VISIT: Follow up for type 2 diabetes mellitus  PCP: Merna Huxley, NP  HISTORY OF PRESENT ILLNESS:   Pam Obrien is a 78 y.o. old female with past medical history listed below, is here for follow up of type 2 diabetes mellitus.   Pertinent Diabetes History: Patient was previously seen by Dr. Von and was last time seen in May 2024.  Patient was diagnosed with type 2 diabetes mellitus in 2013.  She was initially treated with metformin , was stopped due to abdominal discomfort.  She had tried other oral medications including Jentadueto , around 2014 and it stopped in 2015.  She had taken Invokamet  had excessive urination, nausea and weakness and stopped later.  She had been on glyburide  in the past.  Januvia  was planned in the past however not taken due to cost.  Insulin  therapy was started in 2015.  However insulin  was stopped in 2016 because of cost and has only been taking glimepiride .   Chronic Diabetes Complications : Retinopathy: no. Last ophthalmology exam was done on Due.  Nephropathy: CKDIIIa, following with nephrology. Peripheral neuropathy: yes, on gabapentin .  Coronary artery disease: no Stroke: no  Relevant comorbidities and cardiovascular risk factors: Obesity: no Body mass index is 28.23 kg/m.  Hypertension: yes Hyperlipidemia. Yes, on statin.   Current / Home Diabetic regimen includes: Metformin  extended release 1000 mg daily/with supper. Amaryl  2 mg daily with supper.  Prior diabetic medications: Glyburide , Invokamet , basal, bolus insulin , premixed insulin , jentadueto .  Janumet .  Higher dose of metformin  caused abdominal discomfort.  Glycemic data:   Accu-Chek guide glucometer download from September 25 to August 19, 2024 average blood sugar 148.  Highest blood  sugar 159 and lowest blood sugar 141.  She has been checking blood sugar occasionally at different time of the day.  Hypoglycemia: Patient has no hypoglycemic episodes. Patient has hypoglycemia awareness.  Factors modifying glucose control: 1.  Diabetic diet assessment: 2-3 meals a day.  Fruits for snacks.  Occasional ice cream.  2.  Staying active or exercising: No exercise.  3.  Medication compliance: compliant all of the time.  Interval history Glucometer data as reviewed above.  Diabetes has been as reviewed and noted above.  Hemoglobin A1c 6.5%.  Denies hypoglycemic symptoms.  She had seen nephrology reviewed note is scanned into media.  She has CKD 3A.  No other complaints today.  REVIEW OF SYSTEMS As per history of present illness.   PAST MEDICAL HISTORY: Past Medical History:  Diagnosis Date   Allergy    Anemia    as a young adult   Arthritis    Diabetes mellitus without complication (HCC)    Hyperlipidemia    was normal range 05/2017 with PCP   Hypertension     PAST SURGICAL HISTORY: Past Surgical History:  Procedure Laterality Date   ABDOMINAL HYSTERECTOMY  1994   COLONOSCOPY  2008   FLEXIBLE SIGMOIDOSCOPY  2003   MASS EXCISION Left 09/06/2022   Procedure: EXCISION MASS LEFT HAND;  Surgeon: Murrell Drivers, MD;  Location: [facility];  Service: Orthopedics;  Laterality: Left;   POLYPECTOMY      ALLERGIES: Allergies  Allergen Reactions   Aspirin     REACTION: nervous   Bovine (Beef) Protein-Containing Drug Products     Sneezing, nasal drainage, throat scratchy   Celecoxib     REACTION: joints swell   Milk-Related Compounds     itching   Mold Extract [Trichophyton]     Pt does not know the reaction.  Positive allergy test per pt.   Nsaids     REACTION: sweling   Peanuts [Peanut Oil]     Throat feels scratchy   Shellfish Allergy     Nasal congestion, throat scratchy, eyes watery   Sulfa Antibiotics     Other Reaction(s): RASH-HIVES    Sulfamethoxazole     REACTION: urticaria (hives)   Vioxx [Rofecoxib] Swelling    FAMILY HISTORY:  Family History  Problem Relation Age of Onset   Hypertension Sister    Stroke Other    Diabetes Father    Hypertension Father    Stroke Father    Diabetes Sister    Diabetes Brother    Hypertension Brother    Stroke Brother    Cancer Mother    Esophageal cancer Neg Hx    Rectal cancer Neg Hx    Stomach cancer Neg Hx    Colon cancer Neg Hx     SOCIAL HISTORY: Social History   Socioeconomic History   Marital status: Married    Spouse name: Not on file   Number of children: Not on file   Years of education: Not on file   Highest education level: Not on file  Occupational History   Not on file  Tobacco Use   Smoking status: Never   Smokeless tobacco: Never  Vaping Use   Vaping status: Never Used  Substance and Sexual Activity   Alcohol use: No   Drug use: No   Sexual activity: Not on file  Other Topics Concern   Not on file  Social History Narrative   Retired from [employer] work    Separated for 9 years.    Five children, two live locally, three are in [place]    One of her daughters live with her   Has a dog.    She likes to dance and walk   Social Drivers of Health   Financial Resource Strain: Low Risk  (08/14/2022)   Overall Financial Resource Strain (CARDIA)    Difficulty of Paying Living Expenses: Not hard at all  Food Insecurity: No Food Insecurity (08/14/2022)   Hunger Vital Sign    Worried About Running Out of Food in the Last Year: Never true    Ran Out of Food in the Last Year: Never true  Transportation Needs: No Transportation Needs (08/14/2022)   PRAPARE - Administrator, Civil Service (Medical): No    Lack of Transportation (Non-Medical): No  Physical Activity: Insufficiently Active (08/14/2022)   Exercise Vital Sign    Days of Exercise per Week: 7 days    Minutes of Exercise per Session: 20 min  Stress: No Stress Concern Present  (08/14/2022)   [employer] of Occupational Health - Occupational Stress Questionnaire    Feeling of Stress : Not at all  Social Connections: Moderately Integrated (08/14/2022)   Social Connection and Isolation Panel    Frequency of Communication with Friends and Family: More than three times a week    Frequency of Social Gatherings with Friends and Family:  More than three times a week    Attends Religious Services: More than 4 times per year    Active Member of Clubs or Organizations: Yes    Attends Engineer, structural: More than 4 times per year    Marital Status: Divorced    MEDICATIONS:  Current Outpatient Medications  Medication Sig Dispense Refill   ACCU-CHEK GUIDE test strip USE AS INSTRUCTED TO CHECK BLOOD SUGAR ONCE DAILY. 100 strip 2   Accu-Chek Softclix Lancets lancets USE AS DIRECTED EVERY DAY 100 each 1   acetaminophen  (TYLENOL ) 500 MG tablet Take 1 tablet (500 mg total) by mouth every 6 (six) hours as needed. 30 tablet 0   atenolol -chlorthalidone  (TENORETIC ) 50-25 MG tablet TAKE 1/2 TABLET BY MOUTH DAILY 45 tablet 2   Blood Glucose Monitoring Suppl (ACCU-CHEK AVIVA PLUS) w/Device KIT Use Accu Chek Aviva Plus to check blood sugar once daily. 1 kit 0   Blood Glucose Monitoring Suppl DEVI 1 each by Does not apply route in the morning, at noon, and at bedtime. May substitute to any manufacturer covered by patient's insurance. 1 each 0   calcium  citrate-vitamin D  (CITRACAL+D) 315-200 MG-UNIT per tablet Take 1 tablet by mouth daily.     cholecalciferol (VITAMIN D ) 1000 UNITS tablet Take 2,000 Units by mouth daily.     clotrimazole -betamethasone  (LOTRISONE ) cream Apply to affected area 2 times daily prn 15 g 0   FeFum-FePo-FA-B Cmp-C-Zn-Mn-Cu (SE-TAN PLUS ) 162-115.2-1 MG CAPS TAKE 1 CAPSULE BY MOUTH EVERY DAY 90 capsule 3   glimepiride  (AMARYL ) 2 MG tablet Take 1 tablet (2 mg total) by mouth daily with supper. 90 tablet 3   Glucose Blood (BLOOD GLUCOSE TEST STRIPS) STRP  1 each by In Vitro route daily. May substitute to any manufacturer covered by patient's insurance. 100 each 3   Lancets Misc. (ACCU-CHEK FASTCLIX LANCET) KIT by Does not apply route. USE LANCET TO CHECK BLOOD SUGAR ONCE DAILY.     Lancets Misc. MISC 1 each by Does not apply route daily. May substitute to any manufacturer covered by patient's insurance. 100 each 3   Lidocaine  0.5 % GEL Apply 1 Application topically 2 (two) times daily as needed. 170 g 0   metFORMIN  (GLUCOPHAGE -XR) 500 MG 24 hr tablet TAKE 2 TABLETS BY MOUTH EVERY DAY WITH SUPPER 180 tablet 3   omeprazole (PRILOSEC) 20 MG capsule Take 20 mg by mouth daily.     pravastatin  (PRAVACHOL ) 20 MG tablet Take 1 tablet (20 mg total) by mouth daily. 90 tablet 3   spironolactone  (ALDACTONE ) 50 MG tablet Take 1 tablet (50 mg total) by mouth daily. 90 tablet 3   No current facility-administered medications for this visit.    PHYSICAL EXAM: Vitals:   08/19/24 0949 08/19/24 0951  BP: (!) 148/90 (!) 152/82  Pulse: 75   Resp: 20   SpO2: 98%   Weight: 149 lb 6.4 oz (67.8 kg)   Height: 5' 1 (1.549 m)    Body mass index is 28.23 kg/m.  Wt Readings from Last 3 Encounters:  08/19/24 149 lb 6.4 oz (67.8 kg)  04/19/24 146 lb 12.8 oz (66.6 kg)  01/09/24 147 lb (66.7 kg)    General: Well developed, well nourished female in no apparent distress.  HEENT: AT/Chicken, no external lesions.  Eyes: Conjunctiva clear and no icterus. Neck: Neck supple  Lungs: Respirations not labored Neurologic: Alert, oriented, normal speech Extremities / Skin: Dry.  Psychiatric: Does not appear depressed or anxious  Diabetic Foot Exam -  Simple   No data filed    LABS Reviewed Lab Results  Component Value Date   HGBA1C 6.5 (A) 08/19/2024   HGBA1C 6.1 (A) 04/19/2024   HGBA1C 7.1 (A) 01/09/2024   Lab Results  Component Value Date   FRUCTOSAMINE 294 (H) 02/07/2020   FRUCTOSAMINE 323 (H) 01/17/2017   FRUCTOSAMINE 463 (H) 02/20/2016   Lab Results   Component Value Date   CHOL 246 (H) 12/17/2023   HDL 56.90 12/17/2023   LDLCALC 163 (H) 12/17/2023   LDLDIRECT 98.0 12/02/2019   TRIG 131.0 12/17/2023   CHOLHDL 4 12/17/2023   Lab Results  Component Value Date   MICRALBCREAT 16 04/19/2024   Lab Results  Component Value Date   CREATININE 1.57 (H) 04/19/2024   Lab Results  Component Value Date   GFR 41.52 (L) 12/17/2023    ASSESSMENT / PLAN  1. Controlled type 2 diabetes mellitus with complication, without long-term current use of insulin  (HCC)    Diabetes Mellitus type 2, complicated by peripheral neuropathy and CKD. - Diabetic status / severity: controlled.   Lab Results  Component Value Date   HGBA1C 6.5 (A) 08/19/2024    - Hemoglobin A1c goal : <7%  - Medications: No change.  I) continue Amaryl  2 mg daily with supper. II) continue metformin  extended release 1000 mg daily with supper.  Will check renal function/BMP today.  Based on eGFR we will decide if we need to decrease the dose of metformin .  - Home glucose testing: Check at least 2 times a day in the morning fasting and at bedtime.    - Discussed/ Gave Hypoglycemia treatment plan.  # Consult : not required at this time.   # Annual urine for microalbuminuria/ creatinine ratio, no microalbuminuria currently. Last  Lab Results  Component Value Date   MICRALBCREAT 16 04/19/2024    # Foot check nightly / neuropathy, continue gabapentin  100 mg 3 times a day.    # Annual dilated diabetic eye exams.   - Diet: Make healthy diabetic food choices - Life style / activity / exercise: Discussed.  2. Blood pressure  -  BP Readings from Last 1 Encounters:  08/19/24 (!) 152/82    - Control is not in target. Mild elevated blood pressure.  Advised to monitor at home and if remains elevated asked to discuss with primary care provider. - No change in current plans.  3. Lipid status / Hyperlipidemia - Last  Lab Results  Component Value Date   LDLCALC 163  (H) 12/17/2023   -Pravastatin  20 mg daily.  Managed by PCP.  Diagnoses and all orders for this visit:  Controlled type 2 diabetes mellitus with complication, without long-term current use of insulin  (HCC) -     POCT glycosylated hemoglobin (Hb A1C) -     Basic metabolic panel with GFR    DISPOSITION Follow up in clinic in 4 months suggested.    All questions answered and patient verbalized understanding of the plan.  Iraq Starsha Morning, MD [facility]ocrinology [facility] [address] [place], [address] [place], [place] [address] Phone # [phone]

## 2024-08-20 LAB — BASIC METABOLIC PANEL WITH GFR
BUN/Creatinine Ratio: 18 (calc) (ref 6–22)
BUN: 27 mg/dL — ABNORMAL HIGH (ref 7–25)
CO2: 29 mmol/L (ref 20–32)
Calcium: 9.3 mg/dL (ref 8.6–10.4)
Chloride: 102 mmol/L (ref 98–110)
Creat: 1.51 mg/dL — ABNORMAL HIGH (ref 0.60–1.00)
Glucose, Bld: 142 mg/dL — ABNORMAL HIGH (ref 65–99)
Potassium: 4.6 mmol/L (ref 3.5–5.3)
Sodium: 140 mmol/L (ref 135–146)
eGFR: 35 mL/min/1.73m2 — ABNORMAL LOW (ref >=60)

## 2024-08-20 MED ORDER — METFORMIN HCL ER 500 MG PO TB24: 500.0000 mg | ORAL_TABLET | Freq: Every day | ORAL | 3 refills | Status: AC

## 2024-08-20 NOTE — Addendum Note (Signed)
 Addended by: Izan Miron, IRAQ on: 08/20/2024 09:22 AM   Modules accepted: Orders

## 2024-09-09 ENCOUNTER — Ambulatory Visit: Payer: Medicare (Managed Care) | Admitting: Family Medicine

## 2024-09-09 DIAGNOSIS — Z0000 Encounter for general adult medical examination without abnormal findings: Secondary | ICD-10-CM

## 2024-09-09 NOTE — Patient Instructions (Addendum)
 I really enjoyed getting to talk with you today! I am available on Tuesdays and Thursdays for virtual visits if you have any questions or concerns, or if I can be of any further assistance.   CHECKLIST FROM ANNUAL WELLNESS VISIT:  -Follow up (please call to schedule if not scheduled after visit):   -in office follow up with Darleene   -yearly for annual wellness visit with primary care office  Here is a list of your preventive care/health maintenance measures and the plan for each if any are due:  PLAN For any measures below that may be due:    1. Please schedule your eye exam.   2. Can get vaccines at the pharmacy, please let us  know if you do and we will update your record.   Health Maintenance  Topic Date Due   DTaP/Tdap/Td (2 - Tdap) 11/11/2016   OPHTHALMOLOGY EXAM  04/27/2020   Medicare Annual Wellness (AWV)  08/18/2024   COVID-19 Vaccine (1 - 2025-26 season) 09/25/2024 (Originally 07/12/2024)   Zoster Vaccines- Shingrix (1 of 2) 12/10/2024 (Originally 11/20/1994)   Influenza Vaccine  02/08/2025 (Originally 06/11/2024)   FOOT EXAM  01/08/2025   HEMOGLOBIN A1C  02/17/2025   Diabetic kidney evaluation - Urine ACR  04/19/2025   Diabetic kidney evaluation - eGFR measurement  08/19/2025   Pneumococcal Vaccine: 50+ Years  Completed   DEXA SCAN  Completed   Hepatitis C Screening  Completed   Meningococcal B Vaccine  Aged Out   Mammogram  Discontinued   Colonoscopy  Discontinued    -See a dentist at least yearly  -Get your eyes checked and then per your eye specialist's recommendations  -Other issues addressed today:   -I have included below further information regarding a healthy whole foods based diet, physical activity guidelines for adults, stress management and opportunities for social connections. I hope you find this information useful.    -----------------------------------------------------------------------------------------------------------------------------------------------------------------------------------------------------------------------------------------------------------    NUTRITION: -eat real food: lots of colorful vegetables (half the plate) and fruits -5-7 servings of vegetables and fruits per day (fresh or steamed is best), exp. 2 servings of vegetables with lunch and dinner and 2 servings of fruit per day. Berries and greens such as kale and collards are great choices.  -consume on a regular basis:  fresh fruits, fresh veggies, fish, nuts, seeds, healthy oils (such as olive oil, avocado oil), whole grains (make sure for bread/pasta/crackers/etc., that the first ingredient on label contains the word whole), legumes. -can eat small amounts of dairy and lean meat (no larger than the palm of your hand), but avoid processed meats such as ham, bacon, lunch meat, etc. -drink water -try to avoid fast food and pre-packaged foods, processed meat, ultra processed foods/beverages (donuts, candy, etc.) -most experts advise limiting sodium to < 2300mg  per day, should limit further is any chronic conditions such as high blood pressure, heart disease, diabetes, etc. The American Heart Association advised that < 1500mg  is is ideal -try to avoid foods/beverages that contain any ingredients with names you do not recognize  -try to avoid foods/beverages  with added sugar or sweeteners/sweets  -try to avoid sweet drinks (including diet drinks): soda, juice, Gatorade, sweet tea, power drinks, diet drinks -try to avoid white rice, white bread, pasta (unless whole grain)  EXERCISE GUIDELINES FOR ADULTS: -if you wish to increase your physical activity, do so gradually and with the approval of your doctor -STOP and seek medical care immediately if you have any chest pain, chest discomfort or trouble breathing  when starting or  increasing exercise  -move and stretch your body, legs, feet and arms when sitting for long periods -Physical activity guidelines for optimal health in adults: -get at least 150 minutes per week of moderate exercise (can talk, but not sing); this is about 20-30 minutes of sustained activity 5-7 days per week or two 10-15 minute episodes of sustained activity 5-7 days per week -do some muscle building/resistance training/strength training at least 2 days per week  -balance exercises 3+ days per week:   Stand somewhere where you have something sturdy to hold onto if you lose balance    1) lift up on toes, then back down, start with 5x per day and work up to 20x   2) stand and lift one leg straight out to the side so that foot is a few inches of the floor, start with 5x each side and work up to 20x each side   3) stand on one foot, start with 5 seconds each side and work up to 20 seconds on each side  If you need ideas or help with getting more active:  -Silver sneakers https://tools.silversneakers.com  -Walk with a Doc: Http://www.duncan-williams.com/  -try to include resistance (weight lifting/strength building) and balance exercises twice per week: or the following link for ideas: http://castillo-powell.com/  buyducts.dk  STRESS MANAGEMENT: -can try meditating, or just sitting quietly with deep breathing while intentionally relaxing all parts of your body for 5 minutes daily -if you need further help with stress, anxiety or depression please follow up with your primary doctor or contact the wonderful folks at [facility]: [phone]  SOCIAL CONNECTIONS: -options in [place] if you wish to engage in more social and exercise related activities:  -Silver sneakers https://tools.silversneakers.com  -Walk with a Doc: Http://www.duncan-williams.com/  -Check out the [facility] Active Adults 50+  section on the [employer] companies (hiking clubs, book clubs, cards and games, chess, exercise classes, aquatic classes and much more) - see the website for details: https://www.[place].gov/departments/parks-recreation/active-adults50  -YouTube has lots of exercise videos for different ages and abilities as well  -[facility] (a variety of indoor and outdoor inperson activities for adults). 602-178-2740. [address]  -Virtual Online Classes (a variety of topics): see seniorplanet.org or call 916-217-7636  -consider volunteering at a school, hospice center, church, senior center or elsewhere

## 2024-09-09 NOTE — Progress Notes (Signed)
 PATIENT CHECK-IN and HEALTH RISK ASSESSMENT QUESTIONNAIRE:  -completed by phone/video for upcoming Medicare Preventive Visit  Pre-Visit Check-in: 1)Vitals (height, wt, BP, etc) - record in vitals section for visit on day of visit Request home vitals (wt, BP, etc.) and enter into vitals, THEN update Vital Signs SmartPhrase below at the top of the HPI. See below.  2)Review and Update Medications, Allergies PMH, Surgeries, Social history in Epic 3)Hospitalizations in the last year with date/reason? n  4)Review and Update Care Team (patient's specialists) in Epic 5) Complete PHQ9 in Epic  6) Complete Fall Screening in Epic 7)Review all Health Maintenance Due and order if not done.  Medicare Wellness Patient Questionnaire:  Answer theses question about your habits: How often do you have a drink containing alcohol?n How many drinks containing alcohol do you have on a typical day when you are drinking?na How often do you have six or more drinks on one occasion?na Have you ever smoked?n Quit date if applicable? na  How many packs a day do/did you smoke? na Do you use smokeless tobacco?n Do you use an illicit drugs?n On average, how many days per week do you engage in moderate to strenuous exercise (like a brisk walk)? daily On average, how many minutes do you engage in exercise at this level? Does housework and chair exercises, also walks  Typical breakfast: puffed rice, fruit Typical lunch: snacks, fruits Typical dinner:protein, veggies  Beverages: water, sometimes lemonade but not much  Answer theses question about your everyday activities: Can you perform most household chores?y Are you deaf or have significant trouble hearing?n Do you feel that you have a problem with memory?n Do you feel safe at home?n Last dentist visit?n 8. Do you have any difficulty performing your everyday activities?n Are you having any difficulty walking, taking medications on your own, and or difficulty  managing daily home needs?n Do you have difficulty walking or climbing stairs?n Do you have difficulty dressing or bathing?n Do you have difficulty doing errands alone such as visiting a doctor's office or shopping?n Do you currently have any difficulty preparing food and eating?n Do you currently have any difficulty using the toilet?n Do you have any difficulty managing your finances?n Do you have any difficulties with housekeeping of managing your housekeeping?n   Do you have Advanced Directives in place (Living Will, Healthcare Power or Attorney)? N, but has discussed with her children what her wishes are   Last eye Exam and location?[facility] - reports has to make appt   Do you currently use prescribed or non-prescribed narcotic or opioid pain medications?n  Do you have a history or close family history of breast, ovarian, tubal or peritoneal cancer or a family member with BRCA (breast cancer susceptibility 1 and 2) gene mutations? See pmh/FH     ----------------------------------------------------------------------------------------------------------------------------------------------------------------------------------------------------------------------  Because this visit was a virtual/telehealth visit, some criteria may be missing or patient reported. Any vitals not documented were not able to be obtained and vitals that have been documented are patient reported.    MEDICARE ANNUAL PREVENTIVE VISIT WITH PROVIDER: (Welcome to Medicare, initial annual wellness or annual wellness exam)  Virtual Visit via Phone Note  I connected with Pam Obrien on 09/09/24 by phone and verified that I am speaking with the correct person using two identifiers.she prefers to talk by phone.   Location patient: home Location provider:work or home office Persons participating in the virtual visit: patient, provider  Concerns and/or follow up today: doing good.    See  HM section  in Epic for other details of completed HM.    ROS: negative for report of fevers, unintentional weight loss, vision changes, vision loss, hearing loss or change, chest pain, sob, hemoptysis, melena, hematochezia, hematuria, falls, thoughts of suicide or self harm, memory loss  Patient-completed extensive health risk assessment - reviewed and discussed with the patient: See Health Risk Assessment completed with patient prior to the visit either above or in recent phone note. This was reviewed in detailed with the patient today and appropriate recommendations, orders and referrals were placed as needed per Summary below and patient instructions.   Review of Medical History: -PMH, PSH, Family History and current specialty and care providers reviewed and updated and listed below   Patient Care Team: Merna Huxley, NP as PCP - General (Family Medicine)   Past Medical History:  Diagnosis Date   Allergy    Anemia    as a young adult   Arthritis    Diabetes mellitus without complication (HCC)    Hyperlipidemia    was normal range 05/2017 with PCP   Hypertension     Past Surgical History:  Procedure Laterality Date   ABDOMINAL HYSTERECTOMY  1994   COLONOSCOPY  2008   FLEXIBLE SIGMOIDOSCOPY  2003   MASS EXCISION Left 09/06/2022   Procedure: EXCISION MASS LEFT HAND;  Surgeon: Murrell Drivers, MD;  Location: [facility];  Service: Orthopedics;  Laterality: Left;   POLYPECTOMY      Social History   Socioeconomic History   Marital status: Married    Spouse name: Not on file   Number of children: Not on file   Years of education: Not on file   Highest education level: Not on file  Occupational History   Not on file  Tobacco Use   Smoking status: Never   Smokeless tobacco: Never  Vaping Use   Vaping status: Never Used  Substance and Sexual Activity   Alcohol use: No   Drug use: No   Sexual activity: Not on file  Other Topics Concern   Not on file  Social History  Narrative   Retired from [employer] work    Separated for 9 years.    Five children, two live locally, three are in [place]    One of her daughters live with her   Has a dog.    She likes to dance and walk   Social Drivers of Health   Financial Resource Strain: Low Risk  (08/14/2022)   Overall Financial Resource Strain (CARDIA)    Difficulty of Paying Living Expenses: Not hard at all  Food Insecurity: No Food Insecurity (08/14/2022)   Hunger Vital Sign    Worried About Running Out of Food in the Last Year: Never true    Ran Out of Food in the Last Year: Never true  Transportation Needs: No Transportation Needs (08/14/2022)   PRAPARE - Administrator, Civil Service (Medical): No    Lack of Transportation (Non-Medical): No  Physical Activity: Insufficiently Active (08/14/2022)   Exercise Vital Sign    Days of Exercise per Week: 7 days    Minutes of Exercise per Session: 20 min  Stress: No Stress Concern Present (08/14/2022)   [employer] of Occupational Health - Occupational Stress Questionnaire    Feeling of Stress : Not at all  Social Connections: Moderately Integrated (08/14/2022)   Social Connection and Isolation Panel    Frequency of Communication with Friends and Family: More than three times a  week    Frequency of Social Gatherings with Friends and Family: More than three times a week    Attends Religious Services: More than 4 times per year    Active Member of [employer] or Organizations: Yes    Attends Engineer, Structural: More than 4 times per year    Marital Status: Divorced  Intimate Partner Violence: Not At Risk (08/14/2022)   Humiliation, Afraid, Rape, and Kick questionnaire    Fear of Current or Ex-Partner: No    Emotionally Abused: No    Physically Abused: No    Sexually Abused: No    Family History  Problem Relation Age of Onset   Hypertension Sister    Stroke Other    Diabetes Father    Hypertension Father    Stroke Father    Diabetes  Sister    Diabetes Brother    Hypertension Brother    Stroke Brother    Cancer Mother    Esophageal cancer Neg Hx    Rectal cancer Neg Hx    Stomach cancer Neg Hx    Colon cancer Neg Hx     Current Outpatient Medications on File Prior to Visit  Medication Sig Dispense Refill   ACCU-CHEK GUIDE test strip USE AS INSTRUCTED TO CHECK BLOOD SUGAR ONCE DAILY. 100 strip 2   Accu-Chek Softclix Lancets lancets USE AS DIRECTED EVERY DAY 100 each 1   acetaminophen  (TYLENOL ) 500 MG tablet Take 1 tablet (500 mg total) by mouth every 6 (six) hours as needed. 30 tablet 0   atenolol -chlorthalidone  (TENORETIC ) 50-25 MG tablet TAKE 1/2 TABLET BY MOUTH DAILY 45 tablet 2   Blood Glucose Monitoring Suppl (ACCU-CHEK AVIVA PLUS) w/Device KIT Use Accu Chek Aviva Plus to check blood sugar once daily. 1 kit 0   Blood Glucose Monitoring Suppl DEVI 1 each by Does not apply route in the morning, at noon, and at bedtime. May substitute to any manufacturer covered by patient's insurance. 1 each 0   calcium  citrate-vitamin D  (CITRACAL+D) 315-200 MG-UNIT per tablet Take 1 tablet by mouth daily.     cholecalciferol (VITAMIN D ) 1000 UNITS tablet Take 2,000 Units by mouth daily.     clotrimazole -betamethasone  (LOTRISONE ) cream Apply to affected area 2 times daily prn 15 g 0   FeFum-FePo-FA-B Cmp-C-Zn-Mn-Cu (SE-TAN PLUS ) 162-115.2-1 MG CAPS TAKE 1 CAPSULE BY MOUTH EVERY DAY 90 capsule 3   glimepiride  (AMARYL ) 2 MG tablet Take 1 tablet (2 mg total) by mouth daily with supper. 90 tablet 3   Glucose Blood (BLOOD GLUCOSE TEST STRIPS) STRP 1 each by In Vitro route daily. May substitute to any manufacturer covered by patient's insurance. 100 each 3   Lancets Misc. (ACCU-CHEK FASTCLIX LANCET) KIT by Does not apply route. USE LANCET TO CHECK BLOOD SUGAR ONCE DAILY.     Lancets Misc. MISC 1 each by Does not apply route daily. May substitute to any manufacturer covered by patient's insurance. 100 each 3   Lidocaine  0.5 % GEL Apply 1  Application topically 2 (two) times daily as needed. 170 g 0   metFORMIN  (GLUCOPHAGE -XR) 500 MG 24 hr tablet Take 1 tablet (500 mg total) by mouth daily with breakfast. TAKE 2 TABLETS BY MOUTH EVERY DAY WITH SUPPER 90 tablet 3   omeprazole (PRILOSEC) 20 MG capsule Take 20 mg by mouth daily.     pravastatin  (PRAVACHOL ) 20 MG tablet Take 1 tablet (20 mg total) by mouth daily. 90 tablet 3   spironolactone  (ALDACTONE ) 50 MG tablet Take  1 tablet (50 mg total) by mouth daily. 90 tablet 3   No current facility-administered medications on file prior to visit.    Allergies  Allergen Reactions   Aspirin     REACTION: nervous   Bovine (Beef) Protein-Containing Drug Products     Sneezing, nasal drainage, throat scratchy   Celecoxib     REACTION: joints swell   Milk-Related Compounds     itching   Mold Extract [Trichophyton]     Pt does not know the reaction.  Positive allergy test per pt.   Nsaids     REACTION: sweling   Peanuts [Peanut Oil]     Throat feels scratchy   Shellfish Allergy     Nasal congestion, throat scratchy, eyes watery   Sulfa Antibiotics     Other Reaction(s): RASH-HIVES   Sulfamethoxazole     REACTION: urticaria (hives)   Vioxx [Rofecoxib] Swelling       Physical Exam Vitals requested from patient and listed below if patient had equipment and was able to obtain at home for this virtual visit: There were no vitals filed for this visit. Estimated body mass index is 28.23 kg/m as calculated from the following:   Height as of 08/19/24: 5' 1 (1.549 m).   Weight as of 08/19/24: 149 lb 6.4 oz (67.8 kg).  EKG (optional): deferred due to virtual visit  GENERAL: alert, oriented, no acute distress detected, full vision exam deferred due to pandemic and/or virtual encounter   PSYCH/NEURO: pleasant and cooperative, no obvious depression or anxiety, speech and thought processing grossly intact, Cognitive function grossly intact  Flowsheet Row Office Visit from 12/16/2023 in  [facility] [place]  PHQ-9 Total Score 0        09/09/2024    4:26 PM 12/16/2023   10:51 AM 08/19/2023    3:37 PM 12/10/2022   10:29 AM 08/14/2022    1:46 PM  Depression screen PHQ 2/9  Decreased Interest 0 0 0 0 0  Down, Depressed, Hopeless 0 0 0 0 0  PHQ - 2 Score 0 0 0 0 0  Altered sleeping  0  0   Tired, decreased energy  0  0   Change in appetite  0  0   Feeling bad or failure about yourself   0  0   Trouble concentrating  0  0   Moving slowly or fidgety/restless  0  0   Suicidal thoughts  0  0   PHQ-9 Score  0  0   Difficult doing work/chores  Not difficult at all  Not difficult at all        11/11/2022    3:53 PM 11/21/2022    7:32 PM 08/19/2023    3:36 PM 12/16/2023   10:51 AM 09/09/2024    4:26 PM  Fall Risk  Falls in the past year?   0 0 0  Was there an injury with Fall?   0 0 0  Fall Risk Category Calculator   0 0 0  (RETIRED) Patient Fall Risk Level Low fall risk  Low fall risk      Patient at Risk for Falls Due to   No Fall Risks No Fall Risks   Fall risk Follow up   Falls evaluation completed Falls evaluation completed      Data saved with a previous flowsheet row definition     SUMMARY AND PLAN:  Encounter for Medicare annual wellness exam   Discussed applicable health maintenance/preventive  health measures and advised and referred or ordered per patient preferences: -discussed vaccines due recs and risks, she declined most, is considering getting Tdap at the pharmacy -she says she will schedule her eye exam  Health Maintenance  Topic Date Due   DTaP/Tdap/Td (2 - Tdap) 11/11/2016   OPHTHALMOLOGY EXAM  04/27/2020   COVID-19 Vaccine (1 - 2025-26 season) 09/25/2024 (Originally 07/12/2024)   Zoster Vaccines- Shingrix (1 of 2) 12/10/2024 (Originally 11/20/1994)   Influenza Vaccine  02/08/2025 (Originally 06/11/2024)   FOOT EXAM  01/08/2025   HEMOGLOBIN A1C  02/17/2025   Diabetic kidney evaluation - Urine ACR  04/19/2025   Diabetic  kidney evaluation - eGFR measurement  08/19/2025   Medicare Annual Wellness (AWV)  09/09/2025   Pneumococcal Vaccine: 50+ Years  Completed   DEXA SCAN  Completed   Hepatitis C Screening  Completed   Meningococcal B Vaccine  Aged Out   Mammogram  Discontinued   Colonoscopy  Discontinued      Education and counseling on the following was provided based on the above review of health and a plan/checklist for the patient, along with additional information discussed, was provided for the patient in the patient instructions :  -Advised on importance of completing advanced directives, discussed options for completing and provided information in patient instructions as well -Advised and counseled on a healthy lifestyle  -Reviewed patient's current diet. Advised and counseled on a whole foods based healthy diet. A summary of a healthy diet was provided in the Patient Instructions. Discussed various options for adding protein to her diet - she has lots of food allergies which limits her diet.  -reviewed patient's current physical activity level and discussed exercise guidelines for adults. Discussed community resources and ideas for safe exercise at home to assist in meeting exercise guideline recommendations in a safe and healthy way.  -Advise yearly dental visits at minimum and regular eye exams   Follow up: see patient instructions     Patient Instructions  I really enjoyed getting to talk with you today! I am available on Tuesdays and Thursdays for virtual visits if you have any questions or concerns, or if I can be of any further assistance.   CHECKLIST FROM ANNUAL WELLNESS VISIT:  -Follow up (please call to schedule if not scheduled after visit):   -in office follow up with Darleene   -yearly for annual wellness visit with primary care office  Here is a list of your preventive care/health maintenance measures and the plan for each if any are due:  PLAN For any measures below that may be  due:    1. Please schedule your eye exam.   2. Can get vaccines at the pharmacy, please let us  know if you do and we will update your record.   Health Maintenance  Topic Date Due   DTaP/Tdap/Td (2 - Tdap) 11/11/2016   OPHTHALMOLOGY EXAM  04/27/2020   Medicare Annual Wellness (AWV)  08/18/2024   COVID-19 Vaccine (1 - 2025-26 season) 09/25/2024 (Originally 07/12/2024)   Zoster Vaccines- Shingrix (1 of 2) 12/10/2024 (Originally 11/20/1994)   Influenza Vaccine  02/08/2025 (Originally 06/11/2024)   FOOT EXAM  01/08/2025   HEMOGLOBIN A1C  02/17/2025   Diabetic kidney evaluation - Urine ACR  04/19/2025   Diabetic kidney evaluation - eGFR measurement  08/19/2025   Pneumococcal Vaccine: 50+ Years  Completed   DEXA SCAN  Completed   Hepatitis C Screening  Completed   Meningococcal B Vaccine  Aged Out   Mammogram  Discontinued   Colonoscopy  Discontinued    -See a dentist at least yearly  -Get your eyes checked and then per your eye specialist's recommendations  -Other issues addressed today:   -I have included below further information regarding a healthy whole foods based diet, physical activity guidelines for adults, stress management and opportunities for social connections. I hope you find this information useful.   -----------------------------------------------------------------------------------------------------------------------------------------------------------------------------------------------------------------------------------------------------------    NUTRITION: -eat real food: lots of colorful vegetables (half the plate) and fruits -5-7 servings of vegetables and fruits per day (fresh or steamed is best), exp. 2 servings of vegetables with lunch and dinner and 2 servings of fruit per day. Berries and greens such as kale and collards are great choices.  -consume on a regular basis:  fresh fruits, fresh veggies, fish, nuts, seeds, healthy oils (such as olive oil, avocado  oil), whole grains (make sure for bread/pasta/crackers/etc., that the first ingredient on label contains the word whole), legumes. -can eat small amounts of dairy and lean meat (no larger than the palm of your hand), but avoid processed meats such as ham, bacon, lunch meat, etc. -drink water -try to avoid fast food and pre-packaged foods, processed meat, ultra processed foods/beverages (donuts, candy, etc.) -most experts advise limiting sodium to < 2300mg  per day, should limit further is any chronic conditions such as high blood pressure, heart disease, diabetes, etc. The American Heart Association advised that < 1500mg  is is ideal -try to avoid foods/beverages that contain any ingredients with names you do not recognize  -try to avoid foods/beverages  with added sugar or sweeteners/sweets  -try to avoid sweet drinks (including diet drinks): soda, juice, Gatorade, sweet tea, power drinks, diet drinks -try to avoid white rice, white bread, pasta (unless whole grain)  EXERCISE GUIDELINES FOR ADULTS: -if you wish to increase your physical activity, do so gradually and with the approval of your doctor -STOP and seek medical care immediately if you have any chest pain, chest discomfort or trouble breathing when starting or increasing exercise  -move and stretch your body, legs, feet and arms when sitting for long periods -Physical activity guidelines for optimal health in adults: -get at least 150 minutes per week of moderate exercise (can talk, but not sing); this is about 20-30 minutes of sustained activity 5-7 days per week or two 10-15 minute episodes of sustained activity 5-7 days per week -do some muscle building/resistance training/strength training at least 2 days per week  -balance exercises 3+ days per week:   Stand somewhere where you have something sturdy to hold onto if you lose balance    1) lift up on toes, then back down, start with 5x per day and work up to 20x   2) stand and lift  one leg straight out to the side so that foot is a few inches of the floor, start with 5x each side and work up to 20x each side   3) stand on one foot, start with 5 seconds each side and work up to 20 seconds on each side  If you need ideas or help with getting more active:  -Silver sneakers https://tools.silversneakers.com  -Walk with a Doc: Http://www.duncan-williams.com/  -try to include resistance (weight lifting/strength building) and balance exercises twice per week: or the following link for ideas: http://castillo-powell.com/  buyducts.dk  STRESS MANAGEMENT: -can try meditating, or just sitting quietly with deep breathing while intentionally relaxing all parts of your body for 5 minutes daily -if you need further help with stress, anxiety  or depression please follow up with your primary doctor or contact the wonderful folks at [facility]: [phone]  SOCIAL CONNECTIONS: -options in [place] if you wish to engage in more social and exercise related activities:  -Silver sneakers https://tools.silversneakers.com  -Walk with a Doc: Http://www.duncan-williams.com/  -Check out the [facility] Active Adults 50+ section on the [place] of [employer] companies (hiking clubs, book clubs, cards and games, chess, exercise classes, aquatic classes and much more) - see the website for details: https://www.[place].gov/departments/parks-recreation/active-adults50  -YouTube has lots of exercise videos for different ages and abilities as well  -[facility] (a variety of indoor and outdoor inperson activities for adults). [phone]. [address]  -Virtual Online Classes (a variety of topics): see seniorplanet.org or call [phone]  -consider volunteering at a school, hospice center, church, senior center or elsewhere            Chiquita JONELLE Cramp, DO

## 2024-09-16 ENCOUNTER — Encounter: Payer: Self-pay | Admitting: Adult Health

## 2024-09-16 ENCOUNTER — Telehealth: Payer: Self-pay | Admitting: *Deleted

## 2024-09-16 ENCOUNTER — Ambulatory Visit: Payer: Medicare (Managed Care) | Admitting: Adult Health

## 2024-09-16 VITALS — BP 120/82 | HR 87 | Temp 98.1°F | Ht 61.0 in | Wt 155.0 lb

## 2024-09-16 DIAGNOSIS — R310 Gross hematuria: Secondary | ICD-10-CM | POA: Diagnosis not present

## 2024-09-16 LAB — CBC
HCT: 34.1 % — ABNORMAL LOW (ref 36.0–46.0)
Hemoglobin: 11 g/dL — ABNORMAL LOW (ref 12.0–15.0)
MCHC: 32.2 g/dL (ref 30.0–36.0)
MCV: 83.5 fl (ref 78.0–100.0)
Platelets: 261 K/uL (ref 150.0–400.0)
RBC: 4.08 Mil/uL (ref 3.87–5.11)
RDW: 12.7 % (ref 11.5–15.5)
WBC: 6.5 K/uL (ref 4.0–10.5)

## 2024-09-16 LAB — POCT URINALYSIS DIPSTICK
Bilirubin, UA: NEGATIVE
Blood, UA: POSITIVE
Glucose, UA: NEGATIVE
Ketones, UA: NEGATIVE
Leukocytes, UA: NEGATIVE
Nitrite, UA: NEGATIVE
Protein, UA: POSITIVE — AB
Spec Grav, UA: 1.02 (ref 1.010–1.025)
Urobilinogen, UA: 0.2 U/dL
pH, UA: 5.5 (ref 5.0–8.0)

## 2024-09-16 LAB — BASIC METABOLIC PANEL WITH GFR
BUN: 23 mg/dL (ref 6–23)
CO2: 28 meq/L (ref 19–32)
Calcium: 9.1 mg/dL (ref 8.4–10.5)
Chloride: 103 meq/L (ref 96–112)
Creatinine, Ser: 1.18 mg/dL (ref 0.40–1.20)
GFR: 43.83 mL/min — ABNORMAL LOW (ref >60.00)
Glucose, Bld: 163 mg/dL — ABNORMAL HIGH (ref 70–99)
Potassium: 4.3 meq/L (ref 3.5–5.1)
Sodium: 140 meq/L (ref 135–145)

## 2024-09-16 NOTE — Patient Instructions (Addendum)
 I am going to send your urine for some tests and do some blood work on you today   Someone will call you to schedule your CT scan at [facility]  [address] [place]

## 2024-09-16 NOTE — Telephone Encounter (Signed)
 Copied from CRM #[id number]. Topic: Clinical - Request for Lab/Test Order >> Sep 16, 2024  4:27 PM Rea ORN wrote: Reason for CRM: Sonny with [facility] would like clarification for a order they received.   Please call back [phone], opt 1 then opt 4

## 2024-09-16 NOTE — Progress Notes (Signed)
 Subjective:    Patient ID: Pam Obrien, female    DOB: 09/19/1946, 78 y.o.   MRN: [id number]  HPI Discussed the use of AI scribe software for clinical note transcription with the patient, who gave verbal consent to proceed.  History of Present Illness   Pam Obrien is a 78 year old female who presents with hematuria.  She has experienced hematuria since October 17th, with bright red blood in the urine and occasional small clots. The bleeding is intermittent, occurring during some urinations. There is no pain, burning during urination, or blood in the stool. She feels bloated and discomforted, similar to premenstrual symptoms, coinciding with the hematuria onset.  She began drinking tart cherry juice around the time her symptoms started. A nephrologist visit on September 12th showed creatinine levels between 1.1 and 1.3, with normal urine at that time. She denies kidney stones and has not noticed blood in her underwear or discharge outside of urination. Despite good hydration, she is concerned about potential kidney issues.  During urination, she sees blood in the toilet bowl, sometimes separating from the water.        Review of Systems See HPI   Past Medical History:  Diagnosis Date   Allergy    Anemia    as a young adult   Arthritis    Diabetes mellitus without complication (HCC)    Hyperlipidemia    was normal range 05/2017 with PCP   Hypertension     Social History   Socioeconomic History   Marital status: Married    Spouse name: Not on file   Number of children: Not on file   Years of education: Not on file   Highest education level: Not on file  Occupational History   Not on file  Tobacco Use   Smoking status: Never   Smokeless tobacco: Never  Vaping Use   Vaping status: Never Used  Substance and Sexual Activity   Alcohol use: No   Drug use: No   Sexual activity: Not on file  Other Topics Concern   Not on file  Social History Narrative    Retired from [employer] work    Separated for 9 years.    Five children, two live locally, three are in [place]    One of her daughters live with her   Has a dog.    She likes to dance and walk   Social Drivers of Health   Financial Resource Strain: Low Risk  (08/14/2022)   Overall Financial Resource Strain (CARDIA)    Difficulty of Paying Living Expenses: Not hard at all  Food Insecurity: No Food Insecurity (08/14/2022)   Hunger Vital Sign    Worried About Running Out of Food in the Last Year: Never true    Ran Out of Food in the Last Year: Never true  Transportation Needs: No Transportation Needs (08/14/2022)   PRAPARE - Administrator, Civil Service (Medical): No    Lack of Transportation (Non-Medical): No  Physical Activity: Insufficiently Active (08/14/2022)   Exercise Vital Sign    Days of Exercise per Week: 7 days    Minutes of Exercise per Session: 20 min  Stress: No Stress Concern Present (08/14/2022)   [employer] of Occupational Health - Occupational Stress Questionnaire    Feeling of Stress : Not at all  Social Connections: Moderately Integrated (08/14/2022)   Social Connection and Isolation Panel    Frequency of Communication with Friends and Family: More than three  times a week    Frequency of Social Gatherings with Friends and Family: More than three times a week    Attends Religious Services: More than 4 times per year    Active Member of Clubs or Organizations: Yes    Attends Banker Meetings: More than 4 times per year    Marital Status: Divorced  Intimate Partner Violence: Not At Risk (08/14/2022)   Humiliation, Afraid, Rape, and Kick questionnaire    Fear of Current or Ex-Partner: No    Emotionally Abused: No    Physically Abused: No    Sexually Abused: No    Past Surgical History:  Procedure Laterality Date   ABDOMINAL HYSTERECTOMY  1994   COLONOSCOPY  2008   FLEXIBLE SIGMOIDOSCOPY  2003   MASS EXCISION Left 09/06/2022    Procedure: EXCISION MASS LEFT HAND;  Surgeon: Murrell Drivers, MD;  Location: [facility];  Service: Orthopedics;  Laterality: Left;   POLYPECTOMY      Family History  Problem Relation Age of Onset   Hypertension Sister    Stroke Other    Diabetes Father    Hypertension Father    Stroke Father    Diabetes Sister    Diabetes Brother    Hypertension Brother    Stroke Brother    Cancer Mother    Esophageal cancer Neg Hx    Rectal cancer Neg Hx    Stomach cancer Neg Hx    Colon cancer Neg Hx     Allergies  Allergen Reactions   Aspirin     REACTION: nervous   Bovine (Beef) Protein-Containing Drug Products     Sneezing, nasal drainage, throat scratchy   Celecoxib     REACTION: joints swell   Milk-Related Compounds     itching   Mold Extract [Trichophyton]     Pt does not know the reaction.  Positive allergy test per pt.   Nsaids     REACTION: sweling   Peanuts [Peanut Oil]     Throat feels scratchy   Shellfish Allergy     Nasal congestion, throat scratchy, eyes watery   Sulfa Antibiotics     Other Reaction(s): RASH-HIVES   Sulfamethoxazole     REACTION: urticaria (hives)   Vioxx [Rofecoxib] Swelling    Current Outpatient Medications on File Prior to Visit  Medication Sig Dispense Refill   ACCU-CHEK GUIDE test strip USE AS INSTRUCTED TO CHECK BLOOD SUGAR ONCE DAILY. 100 strip 2   Accu-Chek Softclix Lancets lancets USE AS DIRECTED EVERY DAY 100 each 1   acetaminophen  (TYLENOL ) 500 MG tablet Take 1 tablet (500 mg total) by mouth every 6 (six) hours as needed. 30 tablet 0   atenolol -chlorthalidone  (TENORETIC ) 50-25 MG tablet TAKE 1/2 TABLET BY MOUTH DAILY 45 tablet 2   Blood Glucose Monitoring Suppl (ACCU-CHEK AVIVA PLUS) w/Device KIT Use Accu Chek Aviva Plus to check blood sugar once daily. 1 kit 0   Blood Glucose Monitoring Suppl DEVI 1 each by Does not apply route in the morning, at noon, and at bedtime. May substitute to any manufacturer covered by patient's  insurance. 1 each 0   calcium  citrate-vitamin D  (CITRACAL+D) 315-200 MG-UNIT per tablet Take 1 tablet by mouth daily.     cholecalciferol (VITAMIN D ) 1000 UNITS tablet Take 2,000 Units by mouth daily.     clotrimazole -betamethasone  (LOTRISONE ) cream Apply to affected area 2 times daily prn 15 g 0   FeFum-FePo-FA-B Cmp-C-Zn-Mn-Cu (SE-TAN PLUS ) 162-115.2-1 MG CAPS TAKE 1 CAPSULE  BY MOUTH EVERY DAY 90 capsule 3   glimepiride  (AMARYL ) 2 MG tablet Take 1 tablet (2 mg total) by mouth daily with supper. 90 tablet 3   Glucose Blood (BLOOD GLUCOSE TEST STRIPS) STRP 1 each by In Vitro route daily. May substitute to any manufacturer covered by patient's insurance. 100 each 3   Lancets Misc. (ACCU-CHEK FASTCLIX LANCET) KIT by Does not apply route. USE LANCET TO CHECK BLOOD SUGAR ONCE DAILY.     Lancets Misc. MISC 1 each by Does not apply route daily. May substitute to any manufacturer covered by patient's insurance. 100 each 3   Lidocaine  0.5 % GEL Apply 1 Application topically 2 (two) times daily as needed. 170 g 0   metFORMIN  (GLUCOPHAGE -XR) 500 MG 24 hr tablet Take 1 tablet (500 mg total) by mouth daily with breakfast. TAKE 2 TABLETS BY MOUTH EVERY DAY WITH SUPPER 90 tablet 3   omeprazole (PRILOSEC) 20 MG capsule Take 20 mg by mouth daily.     pravastatin  (PRAVACHOL ) 20 MG tablet Take 1 tablet (20 mg total) by mouth daily. 90 tablet 3   spironolactone  (ALDACTONE ) 50 MG tablet Take 1 tablet (50 mg total) by mouth daily. 90 tablet 3   No current facility-administered medications on file prior to visit.    BP 120/82   Pulse 87   Temp 98.1 F (36.7 C) (Oral)   Ht 5' 1 (1.549 m)   Wt 155 lb (70.3 kg)   SpO2 98%   BMI 29.29 kg/m       Objective:   Physical Exam Vitals and nursing note reviewed.  Constitutional:      Appearance: Normal appearance.  Cardiovascular:     Rate and Rhythm: Regular rhythm.     Pulses: Normal pulses.     Heart sounds: Normal heart sounds.  Pulmonary:     Effort:  Pulmonary effort is normal.     Breath sounds: Normal breath sounds.  Abdominal:     General: Abdomen is flat. Bowel sounds are normal.     Palpations: Abdomen is soft.     Tenderness: There is no right CVA tenderness or left CVA tenderness.  Skin:    General: Skin is warm and dry.  Neurological:     General: No focal deficit present.     Mental Status: She is alert and oriented to person, place, and time.  Psychiatric:        Mood and Affect: Mood normal.        Behavior: Behavior normal.        Thought Content: Thought content normal.        Judgment: Judgment normal.        Assessment & Plan:   Assessment and Plan    Hematuria Intermittent hematuria with bright red blood. Differential includes UTI, nephrolithiasis, or malignancy. Recent creatinine normal. Urinalysis + for blood. No leuks or nitrites  - Sent urine sample for lab analysis to rule out UTI. - Ordered blood work to assess kidney function. - Ordered CT scan of abdomen for nephrolithiasis evaluation. - Provided address for CT scan scheduling at [facility].      Darleene Shape, NP  I personally spent a total of 31 minutes in the care of the patient today including preparing to see the patient, getting/reviewing separately obtained history, performing a medically appropriate exam/evaluation, counseling and educating, placing orders, and documenting clinical information in the EHR.

## 2024-09-17 ENCOUNTER — Ambulatory Visit: Payer: Self-pay | Admitting: Adult Health

## 2024-09-17 LAB — URINALYSIS, MICROSCOPIC ONLY

## 2024-09-17 NOTE — Telephone Encounter (Signed)
 Spoke to a representative and she stated that the order needs clarification. She advised that it need to document that provider is trying to rule out kidney stones. I will fix order and resend order.

## 2024-09-17 NOTE — Addendum Note (Signed)
 Addended by: VICCI LEADER R on: 09/17/2024 03:46 PM   Modules accepted: Orders

## 2024-09-17 NOTE — Telephone Encounter (Signed)
 Order has been fix to reflect PCP is trying to rule out kidney stones.

## 2024-09-18 LAB — URINE CULTURE
MICRO NUMBER:: 17202528
Result:: NO GROWTH
SPECIMEN QUALITY:: ADEQUATE

## 2024-09-22 ENCOUNTER — Other Ambulatory Visit: Payer: Self-pay | Admitting: Adult Health

## 2024-09-22 ENCOUNTER — Telehealth: Payer: Self-pay | Admitting: *Deleted

## 2024-09-22 DIAGNOSIS — I10 Essential (primary) hypertension: Secondary | ICD-10-CM

## 2024-09-22 NOTE — Telephone Encounter (Signed)
 Called pt and she's was confused about the call back. No further action needed.

## 2024-09-22 NOTE — Telephone Encounter (Signed)
 Copied from CRM [id number]. Topic: Clinical - Lab/Test Results >> Sep 21, 2024  5:49 PM Shereese L wrote: Reason for CRM: Patient returned office call for lab results and was advised of what message said but would like for the office to call her back

## 2024-09-23 ENCOUNTER — Other Ambulatory Visit: Payer: Self-pay | Admitting: Endocrinology

## 2024-09-23 ENCOUNTER — Ambulatory Visit
Admission: RE | Admit: 2024-09-23 | Discharge: 2024-09-23 | Disposition: A | Payer: Medicare (Managed Care) | Source: Ambulatory Visit | Attending: Adult Health

## 2024-09-23 DIAGNOSIS — R310 Gross hematuria: Secondary | ICD-10-CM

## 2024-09-24 ENCOUNTER — Other Ambulatory Visit (HOSPITAL_COMMUNITY): Payer: Self-pay

## 2024-09-24 ENCOUNTER — Telehealth: Payer: Self-pay

## 2024-09-24 NOTE — Telephone Encounter (Signed)
 Pharmacy Patient Advocate Encounter   Received notification from RX Request Messages that prior authorization for Accu-chek test strips is required/requested.   Insurance verification completed.   The patient is insured through [employer].   Per test claim:  Freestyle precision neo test strips is preferred by the insurance.  If suggested medication is appropriate, Please send in a new RX and discontinue this one. If not, please advise as to why it's not appropriate so that we may request a Prior Authorization. Please note, some preferred medications may still require a PA.  If the suggested medications have not been trialed and there are no contraindications to their use, the PA will not be submitted, as it will not be approved.

## 2024-09-27 ENCOUNTER — Other Ambulatory Visit: Payer: Self-pay

## 2024-09-27 DIAGNOSIS — E118 Type 2 diabetes mellitus with unspecified complications: Secondary | ICD-10-CM

## 2024-09-27 MED ORDER — FREESTYLE FREEDOM LITE W/DEVICE KIT: PACK | 0 refills | Status: AC

## 2024-09-27 MED ORDER — FREESTYLE LITE TEST VI STRP: ORAL_STRIP | 5 refills | Status: AC

## 2024-09-27 NOTE — Telephone Encounter (Signed)
 While patient preference is understood, prior authorization decisions must be based on documented clinical justification and medical necessity, not preference. PA request has been cancelled

## 2024-09-28 NOTE — Telephone Encounter (Signed)
 Left VM for patient to return call regarding latest CT scan results and notes per PCP.

## 2024-09-28 NOTE — Telephone Encounter (Signed)
-----   Message from [facility] sent at 09/28/2024 12:16 PM EST ----- Her CT scan showed small lesions on both kidneys but could not characterize them on the exam. I would like to proceed with an MRI with and without contrast if she is ok with this  ----- Message ----- From: Interface, Rad Results In Sent: 09/25/2024   2:33 PM EST To: Darleene Shape, NP

## 2024-11-25 NOTE — Progress Notes (Signed)
 Pam Obrien                                          MRN: [id number]   11/25/2024   The [facility] Quality Team Specialist reviewed this patient medical record for the purposes of chart review for care gap closure. The following were reviewed: abstraction for care gap closure-controlling blood pressure.    [facility] Quality Team

## 2024-12-24 ENCOUNTER — Ambulatory Visit: Payer: Medicare (Managed Care) | Admitting: Endocrinology
